# Patient Record
Sex: Female | Born: 1940
Health system: Southern US, Community
[De-identification: ages and names within clinical notes are randomized; demographics above are authoritative.]

## PROBLEM LIST (undated history)

## (undated) DIAGNOSIS — Z923 Personal history of irradiation: Secondary | ICD-10-CM

## (undated) DIAGNOSIS — I82409 Acute embolism and thrombosis of unspecified deep veins of unspecified lower extremity: Secondary | ICD-10-CM

## (undated) DIAGNOSIS — IMO0001 Reserved for inherently not codable concepts without codable children: Secondary | ICD-10-CM

## (undated) DIAGNOSIS — K219 Gastro-esophageal reflux disease without esophagitis: Secondary | ICD-10-CM

## (undated) DIAGNOSIS — IMO0002 Reserved for concepts with insufficient information to code with codable children: Secondary | ICD-10-CM

## (undated) DIAGNOSIS — K635 Polyp of colon: Secondary | ICD-10-CM

## (undated) DIAGNOSIS — E7800 Pure hypercholesterolemia, unspecified: Secondary | ICD-10-CM

## (undated) DIAGNOSIS — E611 Iron deficiency: Secondary | ICD-10-CM

## (undated) DIAGNOSIS — M2550 Pain in unspecified joint: Secondary | ICD-10-CM

## (undated) DIAGNOSIS — E785 Hyperlipidemia, unspecified: Secondary | ICD-10-CM

## (undated) DIAGNOSIS — C44712 Basal cell carcinoma of skin of right lower limb, including hip: Secondary | ICD-10-CM

## (undated) DIAGNOSIS — C3490 Malignant neoplasm of unspecified part of unspecified bronchus or lung: Secondary | ICD-10-CM

## (undated) DIAGNOSIS — C49A0 Gastrointestinal stromal tumor, unspecified site: Secondary | ICD-10-CM

## (undated) DIAGNOSIS — M8580 Other specified disorders of bone density and structure, unspecified site: Secondary | ICD-10-CM

## (undated) DIAGNOSIS — I2510 Atherosclerotic heart disease of native coronary artery without angina pectoris: Secondary | ICD-10-CM

## (undated) DIAGNOSIS — E119 Type 2 diabetes mellitus without complications: Secondary | ICD-10-CM

## (undated) DIAGNOSIS — G4700 Insomnia, unspecified: Secondary | ICD-10-CM

## (undated) DIAGNOSIS — I10 Essential (primary) hypertension: Secondary | ICD-10-CM

## (undated) DIAGNOSIS — D649 Anemia, unspecified: Secondary | ICD-10-CM

## (undated) DIAGNOSIS — C801 Malignant (primary) neoplasm, unspecified: Secondary | ICD-10-CM

## (undated) HISTORY — DX: Iron deficiency: E61.1

## (undated) HISTORY — PX: CHOLECYSTECTOMY: SHX55

## (undated) HISTORY — DX: Basal cell carcinoma of skin of right lower limb, including hip: C44.712

## (undated) HISTORY — PX: CARDIAC CATHETERIZATION: SHX172

## (undated) HISTORY — DX: Acute embolism and thrombosis of unspecified deep veins of unspecified lower extremity: I82.409

## (undated) HISTORY — DX: Malignant (primary) neoplasm, unspecified: C80.1

## (undated) HISTORY — DX: Insomnia, unspecified: G47.00

## (undated) HISTORY — PX: BREAST ENHANCEMENT SURGERY: SHX7

## (undated) HISTORY — PX: AUGMENTATION MAMMAPLASTY: SUR837

## (undated) HISTORY — DX: Atherosclerotic heart disease of native coronary artery without angina pectoris: I25.10

## (undated) HISTORY — DX: Pure hypercholesterolemia, unspecified: E78.00

## (undated) HISTORY — DX: Essential (primary) hypertension: I10

## (undated) HISTORY — DX: Pain in unspecified joint: M25.50

## (undated) HISTORY — DX: Anemia, unspecified: D64.9

## (undated) HISTORY — DX: Polyp of colon: K63.5

## (undated) HISTORY — DX: Reserved for inherently not codable concepts without codable children: IMO0001

## (undated) HISTORY — PX: TUBAL LIGATION: SHX77

## (undated) HISTORY — DX: Reserved for concepts with insufficient information to code with codable children: IMO0002

## (undated) HISTORY — DX: Other specified disorders of bone density and structure, unspecified site: M85.80

## (undated) HISTORY — PX: ABDOMINAL HYSTERECTOMY: SHX81

## (undated) HISTORY — DX: Gastro-esophageal reflux disease without esophagitis: K21.9

## (undated) HISTORY — DX: Hyperlipidemia, unspecified: E78.5

## (undated) HISTORY — PX: BREAST SURGERY: SHX581

## (undated) HISTORY — DX: Gastrointestinal stromal tumor, unspecified site: C49.A0

---

## 1991-11-17 DIAGNOSIS — C4491 Basal cell carcinoma of skin, unspecified: Secondary | ICD-10-CM

## 1991-11-17 HISTORY — DX: Basal cell carcinoma of skin, unspecified: C44.91

## 1992-05-28 HISTORY — PX: MOHS SURGERY: SHX181

## 1995-06-25 DIAGNOSIS — C4491 Basal cell carcinoma of skin, unspecified: Secondary | ICD-10-CM

## 1995-06-25 HISTORY — DX: Basal cell carcinoma of skin, unspecified: C44.91

## 1998-12-14 ENCOUNTER — Other Ambulatory Visit: Admission: RE | Admit: 1998-12-14 | Discharge: 1998-12-14 | Payer: Self-pay | Admitting: Obstetrics and Gynecology

## 1998-12-14 ENCOUNTER — Encounter (INDEPENDENT_AMBULATORY_CARE_PROVIDER_SITE_OTHER): Payer: Self-pay

## 1999-05-29 DIAGNOSIS — K279 Peptic ulcer, site unspecified, unspecified as acute or chronic, without hemorrhage or perforation: Secondary | ICD-10-CM

## 1999-05-29 DIAGNOSIS — B9681 Helicobacter pylori [H. pylori] as the cause of diseases classified elsewhere: Secondary | ICD-10-CM

## 1999-05-29 HISTORY — DX: Helicobacter pylori (H. pylori) as the cause of diseases classified elsewhere: B96.81

## 1999-05-29 HISTORY — DX: Helicobacter pylori (H. pylori) as the cause of diseases classified elsewhere: K27.9

## 1999-09-18 ENCOUNTER — Ambulatory Visit (HOSPITAL_COMMUNITY): Admission: RE | Admit: 1999-09-18 | Discharge: 1999-09-18 | Payer: Self-pay | Admitting: Gastroenterology

## 1999-10-04 ENCOUNTER — Ambulatory Visit (HOSPITAL_COMMUNITY): Admission: RE | Admit: 1999-10-04 | Discharge: 1999-10-04 | Payer: Self-pay | Admitting: Gastroenterology

## 2005-05-15 DIAGNOSIS — D229 Melanocytic nevi, unspecified: Secondary | ICD-10-CM

## 2005-05-15 DIAGNOSIS — C4492 Squamous cell carcinoma of skin, unspecified: Secondary | ICD-10-CM

## 2005-05-15 HISTORY — DX: Squamous cell carcinoma of skin, unspecified: C44.92

## 2005-05-15 HISTORY — DX: Melanocytic nevi, unspecified: D22.9

## 2006-05-28 DIAGNOSIS — I2510 Atherosclerotic heart disease of native coronary artery without angina pectoris: Secondary | ICD-10-CM

## 2006-05-28 HISTORY — DX: Atherosclerotic heart disease of native coronary artery without angina pectoris: I25.10

## 2007-04-30 ENCOUNTER — Inpatient Hospital Stay (HOSPITAL_COMMUNITY): Admission: EM | Admit: 2007-04-30 | Discharge: 2007-05-02 | Payer: Self-pay | Admitting: Emergency Medicine

## 2007-05-29 DIAGNOSIS — K922 Gastrointestinal hemorrhage, unspecified: Secondary | ICD-10-CM

## 2007-05-29 HISTORY — DX: Gastrointestinal hemorrhage, unspecified: K92.2

## 2007-07-14 ENCOUNTER — Ambulatory Visit: Payer: Self-pay | Admitting: Vascular Surgery

## 2010-03-17 ENCOUNTER — Emergency Department (HOSPITAL_COMMUNITY): Admission: EM | Admit: 2010-03-17 | Discharge: 2010-03-17 | Payer: Self-pay | Admitting: Emergency Medicine

## 2010-04-07 ENCOUNTER — Encounter: Admission: RE | Admit: 2010-04-07 | Discharge: 2010-04-07 | Payer: Self-pay | Admitting: Gastroenterology

## 2010-04-26 ENCOUNTER — Ambulatory Visit (HOSPITAL_COMMUNITY)
Admission: RE | Admit: 2010-04-26 | Discharge: 2010-04-26 | Payer: Self-pay | Source: Home / Self Care | Admitting: Gastroenterology

## 2010-05-28 HISTORY — PX: WHIPPLE PROCEDURE: SHX2667

## 2010-06-23 LAB — DIFFERENTIAL
Basophils Absolute: 0.1 10*3/uL (ref 0.0–0.1)
Basophils Relative: 1 % (ref 0–1)
Eosinophils Absolute: 0.1 10*3/uL (ref 0.0–0.7)
Eosinophils Relative: 2 % (ref 0–5)
Lymphocytes Relative: 28 % (ref 12–46)
Lymphs Abs: 1.5 10*3/uL (ref 0.7–4.0)
Monocytes Absolute: 0.4 10*3/uL (ref 0.1–1.0)
Monocytes Relative: 7 % (ref 3–12)
Neutro Abs: 3.4 10*3/uL (ref 1.7–7.7)
Neutrophils Relative %: 62 % (ref 43–77)

## 2010-06-23 LAB — COMPREHENSIVE METABOLIC PANEL
ALT: 16 U/L (ref 0–35)
AST: 28 U/L (ref 0–37)
Albumin: 3.8 g/dL (ref 3.5–5.2)
Alkaline Phosphatase: 65 U/L (ref 39–117)
BUN: 16 mg/dL (ref 6–23)
CO2: 27 mEq/L (ref 19–32)
Calcium: 9.6 mg/dL (ref 8.4–10.5)
Chloride: 106 mEq/L (ref 96–112)
Creatinine, Ser: 1.06 mg/dL (ref 0.4–1.2)
GFR calc Af Amer: >60 mL/min (ref >60)
GFR calc non Af Amer: 52 mL/min — ABNORMAL LOW (ref >60)
Glucose, Bld: 133 mg/dL — ABNORMAL HIGH (ref 70–99)
Potassium: 4.4 mEq/L (ref 3.5–5.1)
Sodium: 144 mEq/L (ref 135–145)
Total Bilirubin: 0.6 mg/dL (ref 0.3–1.2)
Total Protein: 7.2 g/dL (ref 6.0–8.3)

## 2010-06-23 LAB — URINE MICROSCOPIC-ADD ON

## 2010-06-23 LAB — PROTIME-INR
INR: 1 (ref 0.00–1.49)
Prothrombin Time: 13.4 seconds (ref 11.6–15.2)

## 2010-06-23 LAB — CBC
HCT: 37 % (ref 36.0–46.0)
Hemoglobin: 11.5 g/dL — ABNORMAL LOW (ref 12.0–15.0)
MCH: 27.1 pg (ref 26.0–34.0)
MCHC: 31.1 g/dL (ref 30.0–36.0)
MCV: 87.1 fL (ref 78.0–100.0)
Platelets: 275 10*3/uL (ref 150–400)
RBC: 4.25 MIL/uL (ref 3.87–5.11)
WBC: 5.5 10*3/uL (ref 4.0–10.5)

## 2010-06-23 LAB — ABO/RH: ABO/RH(D): O POS

## 2010-06-23 LAB — URINALYSIS, ROUTINE W REFLEX MICROSCOPIC
Bilirubin Urine: NEGATIVE
Hgb urine dipstick: NEGATIVE
Ketones, ur: NEGATIVE mg/dL
Nitrite: NEGATIVE
Protein, ur: NEGATIVE mg/dL
Specific Gravity, Urine: 1.024 (ref 1.005–1.030)
Urine Glucose, Fasting: NEGATIVE mg/dL
Urobilinogen, UA: 0.2 mg/dL (ref 0.0–1.0)
pH: 5 (ref 5.0–8.0)

## 2010-06-23 LAB — APTT: aPTT: 34 seconds (ref 24–37)

## 2010-06-23 LAB — SURGICAL PCR SCREEN
MRSA, PCR: INVALID — AB
Staphylococcus aureus: INVALID — AB

## 2010-06-26 LAB — MRSA CULTURE

## 2010-06-27 ENCOUNTER — Inpatient Hospital Stay (HOSPITAL_COMMUNITY)
Admission: RE | Admit: 2010-06-27 | Discharge: 2010-07-12 | DRG: 154 | Disposition: A | Discharge status: Home Health | Payer: BC Managed Care – PPO | Attending: General Surgery | Admitting: General Surgery

## 2010-06-27 ENCOUNTER — Other Ambulatory Visit: Payer: Self-pay | Admitting: General Surgery

## 2010-06-27 DIAGNOSIS — D126 Benign neoplasm of colon, unspecified: Secondary | ICD-10-CM | POA: Diagnosis present

## 2010-06-27 DIAGNOSIS — C179 Malignant neoplasm of small intestine, unspecified: Principal | ICD-10-CM | POA: Diagnosis present

## 2010-06-27 DIAGNOSIS — K219 Gastro-esophageal reflux disease without esophagitis: Secondary | ICD-10-CM | POA: Diagnosis present

## 2010-06-27 DIAGNOSIS — I2510 Atherosclerotic heart disease of native coronary artery without angina pectoris: Secondary | ICD-10-CM | POA: Diagnosis present

## 2010-06-27 DIAGNOSIS — Z95828 Presence of other vascular implants and grafts: Secondary | ICD-10-CM

## 2010-06-27 DIAGNOSIS — Y838 Other surgical procedures as the cause of abnormal reaction of the patient, or of later complication, without mention of misadventure at the time of the procedure: Secondary | ICD-10-CM | POA: Diagnosis not present

## 2010-06-27 DIAGNOSIS — I739 Peripheral vascular disease, unspecified: Secondary | ICD-10-CM | POA: Diagnosis present

## 2010-06-27 DIAGNOSIS — R5082 Postprocedural fever: Secondary | ICD-10-CM | POA: Diagnosis not present

## 2010-06-27 DIAGNOSIS — IMO0002 Reserved for concepts with insufficient information to code with codable children: Secondary | ICD-10-CM | POA: Diagnosis not present

## 2010-06-27 DIAGNOSIS — D649 Anemia, unspecified: Secondary | ICD-10-CM | POA: Diagnosis present

## 2010-06-27 DIAGNOSIS — E785 Hyperlipidemia, unspecified: Secondary | ICD-10-CM | POA: Diagnosis present

## 2010-06-27 DIAGNOSIS — E876 Hypokalemia: Secondary | ICD-10-CM | POA: Diagnosis not present

## 2010-06-27 DIAGNOSIS — I10 Essential (primary) hypertension: Secondary | ICD-10-CM | POA: Diagnosis present

## 2010-06-27 DIAGNOSIS — E46 Unspecified protein-calorie malnutrition: Secondary | ICD-10-CM | POA: Diagnosis present

## 2010-06-27 DIAGNOSIS — K259 Gastric ulcer, unspecified as acute or chronic, without hemorrhage or perforation: Secondary | ICD-10-CM | POA: Diagnosis present

## 2010-06-27 DIAGNOSIS — R7309 Other abnormal glucose: Secondary | ICD-10-CM | POA: Diagnosis present

## 2010-06-27 HISTORY — PX: PANCREATICODUODENECTOMY: SUR1000

## 2010-06-27 LAB — BASIC METABOLIC PANEL
BUN: 7 mg/dL (ref 6–23)
CO2: 24 mEq/L (ref 19–32)
Calcium: 8 mg/dL — ABNORMAL LOW (ref 8.4–10.5)
Chloride: 106 mEq/L (ref 96–112)
Creatinine, Ser: 0.67 mg/dL (ref 0.4–1.2)
GFR calc Af Amer: >60 mL/min (ref >60)
GFR calc non Af Amer: >60 mL/min (ref >60)
Glucose, Bld: 185 mg/dL — ABNORMAL HIGH (ref 70–99)
Potassium: 4.6 mEq/L (ref 3.5–5.1)
Sodium: 137 mEq/L (ref 135–145)

## 2010-06-27 LAB — BLOOD GAS, ARTERIAL
Acid-base deficit: 4.4 mmol/L — ABNORMAL HIGH (ref 0.0–2.0)
Bicarbonate: 22.2 mEq/L (ref 20.0–24.0)
Drawn by: 331471
O2 Content: 10 L/min
O2 Saturation: 99.9 %
Patient temperature: 98.6
TCO2: 21.2 mmol/L (ref 0–100)
pCO2 arterial: 51 mmHg — ABNORMAL HIGH (ref 35.0–45.0)
pH, Arterial: 7.262 — ABNORMAL LOW (ref 7.350–7.400)
pO2, Arterial: 206 mmHg — ABNORMAL HIGH (ref 80.0–100.0)

## 2010-06-27 LAB — CBC
HCT: 31.3 % — ABNORMAL LOW (ref 36.0–46.0)
Hemoglobin: 10 g/dL — ABNORMAL LOW (ref 12.0–15.0)
MCH: 27.8 pg (ref 26.0–34.0)
MCHC: 31.9 g/dL (ref 30.0–36.0)
MCV: 86.9 fL (ref 78.0–100.0)
Platelets: 279 10*3/uL (ref 150–400)
RBC: 3.6 MIL/uL — ABNORMAL LOW (ref 3.87–5.11)
RDW: 25.1 % — ABNORMAL HIGH (ref 11.5–15.5)
WBC: 12.3 10*3/uL — ABNORMAL HIGH (ref 4.0–10.5)

## 2010-06-27 LAB — POCT I-STAT 7, (LYTES, BLD GAS, ICA,H+H)
Acid-base deficit: 3 mmol/L — ABNORMAL HIGH (ref 0.0–2.0)
Bicarbonate: 21.8 mEq/L (ref 20.0–24.0)
Calcium, Ion: 1.11 mmol/L — ABNORMAL LOW (ref 1.12–1.32)
HCT: 27 % — ABNORMAL LOW (ref 36.0–46.0)
Hemoglobin: 9.2 g/dL — ABNORMAL LOW (ref 12.0–15.0)
O2 Saturation: 100 %
Patient temperature: 35.9
Potassium: 3.7 mEq/L (ref 3.5–5.1)
Sodium: 139 mEq/L (ref 135–145)
TCO2: 23 mmol/L (ref 0–100)
pCO2 arterial: 34.9 mmHg — ABNORMAL LOW (ref 35.0–45.0)
pH, Arterial: 7.4 (ref 7.350–7.400)
pO2, Arterial: 172 mmHg — ABNORMAL HIGH (ref 80.0–100.0)

## 2010-06-27 LAB — PHOSPHORUS: Phosphorus: 4.2 mg/dL (ref 2.3–4.6)

## 2010-06-27 LAB — MAGNESIUM: Magnesium: 1.4 mg/dL — ABNORMAL LOW (ref 1.5–2.5)

## 2010-06-28 LAB — PROTIME-INR
INR: 1.11 (ref 0.00–1.49)
Prothrombin Time: 14.5 seconds (ref 11.6–15.2)

## 2010-06-28 LAB — GLUCOSE, CAPILLARY
Glucose-Capillary: 174 mg/dL — ABNORMAL HIGH (ref 70–99)
Glucose-Capillary: 123 mg/dL — ABNORMAL HIGH (ref 70–99)
Glucose-Capillary: 125 mg/dL — ABNORMAL HIGH (ref 70–99)

## 2010-06-28 LAB — CROSSMATCH
ABO/RH(D): O POS
Antibody Screen: NEGATIVE
Unit division: 0
Unit division: 0

## 2010-06-28 LAB — CBC
HCT: 32.7 % — ABNORMAL LOW (ref 36.0–46.0)
Hemoglobin: 10.6 g/dL — ABNORMAL LOW (ref 12.0–15.0)
MCH: 28.3 pg (ref 26.0–34.0)
MCHC: 32.4 g/dL (ref 30.0–36.0)
MCV: 87.2 fL (ref 78.0–100.0)
Platelets: 275 10*3/uL (ref 150–400)
RBC: 3.75 MIL/uL — ABNORMAL LOW (ref 3.87–5.11)
RDW: 24.9 % — ABNORMAL HIGH (ref 11.5–15.5)
WBC: 11.1 10*3/uL — ABNORMAL HIGH (ref 4.0–10.5)

## 2010-06-28 LAB — COMPREHENSIVE METABOLIC PANEL
ALT: 80 U/L — ABNORMAL HIGH (ref 0–35)
AST: 96 U/L — ABNORMAL HIGH (ref 0–37)
Albumin: 2.7 g/dL — ABNORMAL LOW (ref 3.5–5.2)
Alkaline Phosphatase: 45 U/L (ref 39–117)
BUN: 9 mg/dL (ref 6–23)
CO2: 25 mEq/L (ref 19–32)
Calcium: 8.1 mg/dL — ABNORMAL LOW (ref 8.4–10.5)
Chloride: 105 mEq/L (ref 96–112)
Creatinine, Ser: 0.67 mg/dL (ref 0.4–1.2)
GFR calc Af Amer: >60 mL/min (ref >60)
GFR calc non Af Amer: >60 mL/min (ref >60)
Glucose, Bld: 180 mg/dL — ABNORMAL HIGH (ref 70–99)
Potassium: 4.5 mEq/L (ref 3.5–5.1)
Sodium: 136 mEq/L (ref 135–145)
Total Bilirubin: 0.6 mg/dL (ref 0.3–1.2)
Total Protein: 5.1 g/dL — ABNORMAL LOW (ref 6.0–8.3)

## 2010-06-28 LAB — PHOSPHORUS: Phosphorus: 3.9 mg/dL (ref 2.3–4.6)

## 2010-06-28 LAB — MAGNESIUM: Magnesium: 2 mg/dL (ref 1.5–2.5)

## 2010-06-28 LAB — APTT: aPTT: 38 seconds — ABNORMAL HIGH (ref 24–37)

## 2010-06-29 LAB — GLUCOSE, CAPILLARY
Glucose-Capillary: 104 mg/dL — ABNORMAL HIGH (ref 70–99)
Glucose-Capillary: 108 mg/dL — ABNORMAL HIGH (ref 70–99)
Glucose-Capillary: 119 mg/dL — ABNORMAL HIGH (ref 70–99)
Glucose-Capillary: 124 mg/dL — ABNORMAL HIGH (ref 70–99)
Glucose-Capillary: 130 mg/dL — ABNORMAL HIGH (ref 70–99)
Glucose-Capillary: 131 mg/dL — ABNORMAL HIGH (ref 70–99)
Glucose-Capillary: 134 mg/dL — ABNORMAL HIGH (ref 70–99)

## 2010-06-29 LAB — COMPREHENSIVE METABOLIC PANEL
ALT: 71 U/L — ABNORMAL HIGH (ref 0–35)
AST: 75 U/L — ABNORMAL HIGH (ref 0–37)
Albumin: 2.2 g/dL — ABNORMAL LOW (ref 3.5–5.2)
Alkaline Phosphatase: 47 U/L (ref 39–117)
BUN: 8 mg/dL (ref 6–23)
CO2: 25 mEq/L (ref 19–32)
Calcium: 7.8 mg/dL — ABNORMAL LOW (ref 8.4–10.5)
Chloride: 100 mEq/L (ref 96–112)
Creatinine, Ser: 0.64 mg/dL (ref 0.4–1.2)
GFR calc Af Amer: >60 mL/min (ref >60)
GFR calc non Af Amer: >60 mL/min (ref >60)
Glucose, Bld: 131 mg/dL — ABNORMAL HIGH (ref 70–99)
Potassium: 4.7 mEq/L (ref 3.5–5.1)
Sodium: 133 mEq/L — ABNORMAL LOW (ref 135–145)
Total Bilirubin: 0.8 mg/dL (ref 0.3–1.2)
Total Protein: 4.7 g/dL — ABNORMAL LOW (ref 6.0–8.3)

## 2010-06-29 LAB — CBC
HCT: 33.1 % — ABNORMAL LOW (ref 36.0–46.0)
Hemoglobin: 10.4 g/dL — ABNORMAL LOW (ref 12.0–15.0)
MCH: 27.3 pg (ref 26.0–34.0)
MCHC: 31.4 g/dL (ref 30.0–36.0)
MCV: 86.9 fL (ref 78.0–100.0)
Platelets: 256 10*3/uL (ref 150–400)
RBC: 3.81 MIL/uL — ABNORMAL LOW (ref 3.87–5.11)
RDW: 25 % — ABNORMAL HIGH (ref 11.5–15.5)
WBC: 14.1 10*3/uL — ABNORMAL HIGH (ref 4.0–10.5)

## 2010-06-29 LAB — HEMOGLOBIN A1C
Hgb A1c MFr Bld: 4.8 % (ref <5.7)
Mean Plasma Glucose: 91 mg/dL (ref <117)

## 2010-06-29 LAB — PHOSPHORUS: Phosphorus: 2.5 mg/dL (ref 2.3–4.6)

## 2010-06-29 LAB — MAGNESIUM: Magnesium: 1.9 mg/dL (ref 1.5–2.5)

## 2010-06-30 LAB — COMPREHENSIVE METABOLIC PANEL
ALT: 42 U/L — ABNORMAL HIGH (ref 0–35)
AST: 37 U/L (ref 0–37)
Albumin: 2.1 g/dL — ABNORMAL LOW (ref 3.5–5.2)
Alkaline Phosphatase: 51 U/L (ref 39–117)
BUN: 7 mg/dL (ref 6–23)
CO2: 26 mEq/L (ref 19–32)
Calcium: 7.7 mg/dL — ABNORMAL LOW (ref 8.4–10.5)
Chloride: 101 mEq/L (ref 96–112)
Creatinine, Ser: 0.6 mg/dL (ref 0.4–1.2)
GFR calc Af Amer: >60 mL/min (ref >60)
GFR calc non Af Amer: >60 mL/min (ref >60)
Glucose, Bld: 151 mg/dL — ABNORMAL HIGH (ref 70–99)
Potassium: 4.3 mEq/L (ref 3.5–5.1)
Sodium: 132 mEq/L — ABNORMAL LOW (ref 135–145)
Total Bilirubin: 0.7 mg/dL (ref 0.3–1.2)
Total Protein: 4.8 g/dL — ABNORMAL LOW (ref 6.0–8.3)

## 2010-06-30 LAB — CBC
HCT: 28.4 % — ABNORMAL LOW (ref 36.0–46.0)
Hemoglobin: 9.1 g/dL — ABNORMAL LOW (ref 12.0–15.0)
MCH: 27.8 pg (ref 26.0–34.0)
MCHC: 32 g/dL (ref 30.0–36.0)
MCV: 86.9 fL (ref 78.0–100.0)
Platelets: 200 10*3/uL (ref 150–400)
RBC: 3.27 MIL/uL — ABNORMAL LOW (ref 3.87–5.11)
RDW: 24.9 % — ABNORMAL HIGH (ref 11.5–15.5)
WBC: 11.8 10*3/uL — ABNORMAL HIGH (ref 4.0–10.5)

## 2010-06-30 LAB — GLUCOSE, CAPILLARY
Glucose-Capillary: 131 mg/dL — ABNORMAL HIGH (ref 70–99)
Glucose-Capillary: 126 mg/dL — ABNORMAL HIGH (ref 70–99)
Glucose-Capillary: 116 mg/dL — ABNORMAL HIGH (ref 70–99)
Glucose-Capillary: 137 mg/dL — ABNORMAL HIGH (ref 70–99)
Glucose-Capillary: 125 mg/dL — ABNORMAL HIGH (ref 70–99)

## 2010-07-01 LAB — GLUCOSE, CAPILLARY
Glucose-Capillary: 108 mg/dL — ABNORMAL HIGH (ref 70–99)
Glucose-Capillary: 113 mg/dL — ABNORMAL HIGH (ref 70–99)
Glucose-Capillary: 122 mg/dL — ABNORMAL HIGH (ref 70–99)
Glucose-Capillary: 126 mg/dL — ABNORMAL HIGH (ref 70–99)
Glucose-Capillary: 128 mg/dL — ABNORMAL HIGH (ref 70–99)

## 2010-07-01 LAB — COMPREHENSIVE METABOLIC PANEL
ALT: 30 U/L (ref 0–35)
AST: 28 U/L (ref 0–37)
Albumin: 2.1 g/dL — ABNORMAL LOW (ref 3.5–5.2)
Alkaline Phosphatase: 70 U/L (ref 39–117)
BUN: 6 mg/dL (ref 6–23)
CO2: 26 mEq/L (ref 19–32)
Calcium: 7.9 mg/dL — ABNORMAL LOW (ref 8.4–10.5)
Chloride: 101 mEq/L (ref 96–112)
Creatinine, Ser: 0.61 mg/dL (ref 0.4–1.2)
GFR calc Af Amer: >60 mL/min (ref >60)
GFR calc non Af Amer: >60 mL/min (ref >60)
Glucose, Bld: 139 mg/dL — ABNORMAL HIGH (ref 70–99)
Potassium: 3.8 mEq/L (ref 3.5–5.1)
Sodium: 134 mEq/L — ABNORMAL LOW (ref 135–145)
Total Bilirubin: 0.5 mg/dL (ref 0.3–1.2)
Total Protein: 5 g/dL — ABNORMAL LOW (ref 6.0–8.3)

## 2010-07-01 LAB — MAGNESIUM: Magnesium: 2.1 mg/dL (ref 1.5–2.5)

## 2010-07-01 LAB — PHOSPHORUS: Phosphorus: 1.6 mg/dL — ABNORMAL LOW (ref 2.3–4.6)

## 2010-07-01 LAB — AMYLASE, BODY FLUID: Amylase, Fluid: 773 U/L

## 2010-07-01 LAB — CBC
HCT: 27.4 % — ABNORMAL LOW (ref 36.0–46.0)
Hemoglobin: 8.7 g/dL — ABNORMAL LOW (ref 12.0–15.0)
MCH: 27.4 pg (ref 26.0–34.0)
MCHC: 31.8 g/dL (ref 30.0–36.0)
MCV: 86.2 fL (ref 78.0–100.0)
Platelets: 255 10*3/uL (ref 150–400)
RBC: 3.18 MIL/uL — ABNORMAL LOW (ref 3.87–5.11)
RDW: 25.3 % — ABNORMAL HIGH (ref 11.5–15.5)
WBC: 14.2 10*3/uL — ABNORMAL HIGH (ref 4.0–10.5)

## 2010-07-01 LAB — AMYLASE: Amylase: 106 U/L — ABNORMAL HIGH (ref 0–105)

## 2010-07-02 LAB — GLUCOSE, CAPILLARY
Glucose-Capillary: 104 mg/dL — ABNORMAL HIGH (ref 70–99)
Glucose-Capillary: 108 mg/dL — ABNORMAL HIGH (ref 70–99)
Glucose-Capillary: 115 mg/dL — ABNORMAL HIGH (ref 70–99)
Glucose-Capillary: 117 mg/dL — ABNORMAL HIGH (ref 70–99)
Glucose-Capillary: 119 mg/dL — ABNORMAL HIGH (ref 70–99)
Glucose-Capillary: 125 mg/dL — ABNORMAL HIGH (ref 70–99)
Glucose-Capillary: 127 mg/dL — ABNORMAL HIGH (ref 70–99)
Glucose-Capillary: 129 mg/dL — ABNORMAL HIGH (ref 70–99)

## 2010-07-02 LAB — CBC
HCT: 25.3 % — ABNORMAL LOW (ref 36.0–46.0)
Hemoglobin: 8.2 g/dL — ABNORMAL LOW (ref 12.0–15.0)
MCH: 27.7 pg (ref 26.0–34.0)
MCHC: 32.4 g/dL (ref 30.0–36.0)
MCV: 85.5 fL (ref 78.0–100.0)
Platelets: 268 10*3/uL (ref 150–400)
RBC: 2.96 MIL/uL — ABNORMAL LOW (ref 3.87–5.11)
RDW: 25.6 % — ABNORMAL HIGH (ref 11.5–15.5)
WBC: 13 10*3/uL — ABNORMAL HIGH (ref 4.0–10.5)

## 2010-07-02 LAB — CREATININE, SERUM
Creatinine, Ser: 0.65 mg/dL (ref 0.4–1.2)
GFR calc Af Amer: >60 mL/min (ref >60)
GFR calc non Af Amer: >60 mL/min (ref >60)

## 2010-07-02 LAB — POTASSIUM: Potassium: 3.1 mEq/L — ABNORMAL LOW (ref 3.5–5.1)

## 2010-07-02 LAB — PHOSPHORUS: Phosphorus: 2.7 mg/dL (ref 2.3–4.6)

## 2010-07-02 LAB — MAGNESIUM: Magnesium: 2.3 mg/dL (ref 1.5–2.5)

## 2010-07-03 LAB — LIPASE, BLOOD: Lipase: 18 U/L (ref 11–59)

## 2010-07-03 LAB — GLUCOSE, CAPILLARY
Glucose-Capillary: 126 mg/dL — ABNORMAL HIGH (ref 70–99)
Glucose-Capillary: 114 mg/dL — ABNORMAL HIGH (ref 70–99)
Glucose-Capillary: 113 mg/dL — ABNORMAL HIGH (ref 70–99)
Glucose-Capillary: 118 mg/dL — ABNORMAL HIGH (ref 70–99)
Glucose-Capillary: 119 mg/dL — ABNORMAL HIGH (ref 70–99)

## 2010-07-03 LAB — COMPREHENSIVE METABOLIC PANEL
ALT: 19 U/L (ref 0–35)
AST: 19 U/L (ref 0–37)
Albumin: 1.8 g/dL — ABNORMAL LOW (ref 3.5–5.2)
Alkaline Phosphatase: 76 U/L (ref 39–117)
BUN: 8 mg/dL (ref 6–23)
CO2: 26 mEq/L (ref 19–32)
Calcium: 8 mg/dL — ABNORMAL LOW (ref 8.4–10.5)
Chloride: 99 mEq/L (ref 96–112)
Creatinine, Ser: 0.79 mg/dL (ref 0.4–1.2)
GFR calc Af Amer: >60 mL/min (ref >60)
GFR calc non Af Amer: >60 mL/min (ref >60)
Glucose, Bld: 120 mg/dL — ABNORMAL HIGH (ref 70–99)
Potassium: 3.2 mEq/L — ABNORMAL LOW (ref 3.5–5.1)
Sodium: 135 mEq/L (ref 135–145)
Total Bilirubin: 0.9 mg/dL (ref 0.3–1.2)
Total Protein: 5.1 g/dL — ABNORMAL LOW (ref 6.0–8.3)

## 2010-07-03 LAB — CBC
HCT: 26.3 % — ABNORMAL LOW (ref 36.0–46.0)
Hemoglobin: 8.5 g/dL — ABNORMAL LOW (ref 12.0–15.0)
MCH: 27.5 pg (ref 26.0–34.0)
MCHC: 32.3 g/dL (ref 30.0–36.0)
MCV: 85.1 fL (ref 78.0–100.0)
Platelets: 291 10*3/uL (ref 150–400)
RBC: 3.09 MIL/uL — ABNORMAL LOW (ref 3.87–5.11)
RDW: 25.6 % — ABNORMAL HIGH (ref 11.5–15.5)
WBC: 16.7 10*3/uL — ABNORMAL HIGH (ref 4.0–10.5)

## 2010-07-03 LAB — PREALBUMIN: Prealbumin: 6.3 mg/dL — ABNORMAL LOW (ref 17.0–34.0)

## 2010-07-04 LAB — CBC
HCT: 26.2 % — ABNORMAL LOW (ref 36.0–46.0)
Hemoglobin: 8.5 g/dL — ABNORMAL LOW (ref 12.0–15.0)
MCH: 27.3 pg (ref 26.0–34.0)
MCHC: 32.4 g/dL (ref 30.0–36.0)
MCV: 84.2 fL (ref 78.0–100.0)
Platelets: 324 10*3/uL (ref 150–400)
RBC: 3.11 MIL/uL — ABNORMAL LOW (ref 3.87–5.11)
RDW: 25.7 % — ABNORMAL HIGH (ref 11.5–15.5)
WBC: 20.5 10*3/uL — ABNORMAL HIGH (ref 4.0–10.5)

## 2010-07-04 LAB — BASIC METABOLIC PANEL
BUN: 6 mg/dL (ref 6–23)
CO2: 24 mEq/L (ref 19–32)
Calcium: 7.9 mg/dL — ABNORMAL LOW (ref 8.4–10.5)
Chloride: 100 mEq/L (ref 96–112)
Creatinine, Ser: 0.68 mg/dL (ref 0.4–1.2)
GFR calc Af Amer: >60 mL/min (ref >60)
GFR calc non Af Amer: >60 mL/min (ref >60)
Glucose, Bld: 111 mg/dL — ABNORMAL HIGH (ref 70–99)
Potassium: 3.6 mEq/L (ref 3.5–5.1)
Sodium: 135 mEq/L (ref 135–145)

## 2010-07-04 LAB — GLUCOSE, CAPILLARY
Glucose-Capillary: 112 mg/dL — ABNORMAL HIGH (ref 70–99)
Glucose-Capillary: 114 mg/dL — ABNORMAL HIGH (ref 70–99)
Glucose-Capillary: 122 mg/dL — ABNORMAL HIGH (ref 70–99)
Glucose-Capillary: 142 mg/dL — ABNORMAL HIGH (ref 70–99)
Glucose-Capillary: 149 mg/dL — ABNORMAL HIGH (ref 70–99)
Glucose-Capillary: 150 mg/dL — ABNORMAL HIGH (ref 70–99)

## 2010-07-05 ENCOUNTER — Inpatient Hospital Stay (HOSPITAL_COMMUNITY): Payer: BC Managed Care – PPO

## 2010-07-05 LAB — COMPREHENSIVE METABOLIC PANEL
ALT: 12 U/L (ref 0–35)
AST: 17 U/L (ref 0–37)
Albumin: 1.6 g/dL — ABNORMAL LOW (ref 3.5–5.2)
Alkaline Phosphatase: 74 U/L (ref 39–117)
BUN: 6 mg/dL (ref 6–23)
CO2: 28 mEq/L (ref 19–32)
Calcium: 7.9 mg/dL — ABNORMAL LOW (ref 8.4–10.5)
Chloride: 98 mEq/L (ref 96–112)
Creatinine, Ser: 0.69 mg/dL (ref 0.4–1.2)
GFR calc Af Amer: >60 mL/min (ref >60)
GFR calc non Af Amer: >60 mL/min (ref >60)
Glucose, Bld: 219 mg/dL — ABNORMAL HIGH (ref 70–99)
Potassium: 3.4 mEq/L — ABNORMAL LOW (ref 3.5–5.1)
Sodium: 134 mEq/L — ABNORMAL LOW (ref 135–145)
Total Bilirubin: 0.3 mg/dL (ref 0.3–1.2)
Total Protein: 4.9 g/dL — ABNORMAL LOW (ref 6.0–8.3)

## 2010-07-05 LAB — DIFFERENTIAL
Basophils Absolute: 0 10*3/uL (ref 0.0–0.1)
Basophils Relative: 0 % (ref 0–1)
Eosinophils Absolute: 0.2 10*3/uL (ref 0.0–0.7)
Eosinophils Relative: 1 % (ref 0–5)
Lymphocytes Relative: 5 % — ABNORMAL LOW (ref 12–46)
Lymphs Abs: 1 10*3/uL (ref 0.7–4.0)
Monocytes Absolute: 1.3 10*3/uL — ABNORMAL HIGH (ref 0.1–1.0)
Monocytes Relative: 7 % (ref 3–12)
Neutro Abs: 16.7 10*3/uL — ABNORMAL HIGH (ref 1.7–7.7)
Neutrophils Relative %: 87 % — ABNORMAL HIGH (ref 43–77)

## 2010-07-05 LAB — CBC
HCT: 25.1 % — ABNORMAL LOW (ref 36.0–46.0)
Hemoglobin: 8.1 g/dL — ABNORMAL LOW (ref 12.0–15.0)
MCH: 27.3 pg (ref 26.0–34.0)
MCHC: 32.3 g/dL (ref 30.0–36.0)
MCV: 84.5 fL (ref 78.0–100.0)
Platelets: 342 10*3/uL (ref 150–400)
RBC: 2.97 MIL/uL — ABNORMAL LOW (ref 3.87–5.11)
RDW: 25.5 % — ABNORMAL HIGH (ref 11.5–15.5)
WBC: 19.2 10*3/uL — ABNORMAL HIGH (ref 4.0–10.5)

## 2010-07-05 LAB — PREALBUMIN: Prealbumin: 5 mg/dL — ABNORMAL LOW (ref 17.0–34.0)

## 2010-07-05 LAB — GLUCOSE, CAPILLARY
Glucose-Capillary: 131 mg/dL — ABNORMAL HIGH (ref 70–99)
Glucose-Capillary: 155 mg/dL — ABNORMAL HIGH (ref 70–99)
Glucose-Capillary: 164 mg/dL — ABNORMAL HIGH (ref 70–99)
Glucose-Capillary: 234 mg/dL — ABNORMAL HIGH (ref 70–99)
Glucose-Capillary: 245 mg/dL — ABNORMAL HIGH (ref 70–99)
Glucose-Capillary: 256 mg/dL — ABNORMAL HIGH (ref 70–99)

## 2010-07-05 LAB — TRIGLYCERIDES: Triglycerides: 109 mg/dL (ref <150)

## 2010-07-05 LAB — PHOSPHORUS: Phosphorus: 3.2 mg/dL (ref 2.3–4.6)

## 2010-07-05 LAB — MAGNESIUM: Magnesium: 2 mg/dL (ref 1.5–2.5)

## 2010-07-05 LAB — CHOLESTEROL, TOTAL: Cholesterol: 85 mg/dL (ref 0–200)

## 2010-07-05 NOTE — Op Note (Signed)
[name], [name]           ACCOUNT NO.:  [id number]  MEDICAL RECORD NO.:  [id number]          PATIENT TYPE:  INP  LOCATION:  0003                         FACILITY:  [facility]  PHYSICIAN:  Almond Lint, MD       DATE OF BIRTH:  1940-12-21  DATE OF PROCEDURE:  06/27/2010 DATE OF DISCHARGE:                              OPERATIVE REPORT   PREOPERATIVE DIAGNOSIS:  Duodenal mass x2.  POSTOPERATIVE DIAGNOSIS:  Duodenal mass and jejunal mass.  PROCEDURE:  Pancreaticoduodenectomy (Whipple), pancreatic stent placement and On-Q pain pump catheter placement.  SURGEON:  Almond Lint, MD  ASSISTANT:  Lorne Skeens. Hoxworth, M.D.  ANESTHESIA:  General and local.  FINDINGS:  Exophytic mass in the proximal jejunum, approximately 3 cm in diameter, and the duodenal mass proximal to the ampulla was not visible from a duodenotomy, but was palpable in the specimen.  SPECIMENS:  Pancreaticoduodenectomy, pylorus, gallbladder to pathology.  ESTIMATED BLOOD LOSS:  700 mL.  COMPLICATIONS:  None known.  PROCEDURE:  [name] was identified in the holding area an taken to the operating room where she was placed supine on the operating room table.  General anesthesia was induced.  A central catheter was placed. A Foley catheter was placed.  Her abdomen was prepped and draped in a sterile fashion.  A time-out was performed according to the surgical safety check list.  When all was correct, we continued.  A midline incision was made above the umbilicus.  This was done with a #10 blade. The subcutaneous tissues were divided with cautery and the fascia was opened in the midline with cautery.  The peritoneum was elevated with 2 tonsil clamps and opened sharply with Metzenbaum scissors.  The fascial incision was carried out the length of the skin incision with cautery.   First, the larger mass felt to be at the fourth portion of the duodenum.  It was evaluated and was noted to be in the proximal  jejunum.  The duodenum was then Kocherized and 2 stay sutures were placed on the duodenum with 2-0 silks.  The duodenotomy was made with cautery.  There was no visible sign of the mass; however, the ampulla was very nicely visualized.  It felt like the 1 cm mass visible on CT scan was just proximal to the ampulla that was difficult to ascertain.  There was a little bit of contour in area but not anything dramatic.  An decision was then made to proceed with Whipple.  The incision was then carried out laterally with a #10 blade and then the cautery was used to open the subcutaneous tissue and the muscle layers.  The upper skin flap was sutured down to the skin to facilitate visualization.  The Bookwalter retractor was used to assist with visualization.  The gallbladder was clamped with a Tresa Endo and taken down off the liver with the cautery. Once the infundibulum was approached, the cystic duct was skeletonized with the Overholt and the cautery.  Then, the cystic artery was divided with a harmonic scalpel and clipped with a small Hem-o-Lok.  The gallbladder was used as a handle to skeletonize the cystic duct.  The  cystic duct went down to the common bile duct and this was identified.  There was a significant amount of lymphovascular tissue overlying the porta.  This was taken down.  The gastrohepatic ligament was taken down.  The common hepatic artery was palpated at the superior surface of the pancreas and this was identified at the juncture where it turned to the proper hepatic and the gastroduodenal artery.  The gastroduodenal artery was isolated.  This was held to investigate the undersurface of the pancreas.  The omentum was divided with the harmonic scalpel and the middle colic vein was identified.  The middle colic vein was traced in the cranial direction and was traced to the inferior border of the pancreas.  A Kelly clamp passed easily underneath the pancreas that was visualized on  the superior surface.  The gastroduodenal artery was test clamped and there was still good flow to the liver.  This was then clamped, divided and suture ligated with a 2-0 Prolene and a locking hemoclip.  The other end was tied with 2-0 silk.    Attention was then directed to the stomach.  At first, attempts were made to preserve the pylorus by passing underneath the duodenum just distal to the pylorus and dividing it with a TA.  The pylorus was found to be extremely tight and the pylorus was then taken. Two fires of GIA 75 were used to divide the stomach farther up.  The common bile duct was skeletonized and clamped with a bulldog. At this point, attention was to the jejunum.  The jejunum was divided with a GIA 75 approximately 8 cm distal to the mass which was around 4-5 cm distal to the ligament of Treitz.  The distal end of the jejunum was oversewn with a 3-0 PDS.  Also of note, the stomach staple line was oversewn with a 3- 0 PDS as well.  The harmonic scalpel was used to divide the mesentery going to the duodenum and proximal jejunum.  There were several areas where locking clips had to be placed.  Once the duodenum had been freed up and all the adhesions on the left side of the abdomen were taken, this was passed underneath the SMA and SMV to the right upper quadrant.  There were no additional adhesions that had to be taken.    The common bile duct was then divided sharply with curved scissors.  There was a very brisk bleeder from the common bile duct and this was isolated and tapped gently with the Bovie.  The common bile duct adhesions to the portal vein were taken around 8 mm in order to free up adequately for an anastomosis.  The Tresa Endo was then passed underneath the pancreas and the pancreas was divided with a #10 blade.  2-0 silks were used for hemostatic sutures on the superior and inferior pancreaticoduodenal arteries.  The pancreatic duct was visible and of good  caliber.  It was approximately 5 mm in diameter.  The other side of the pancreas for the specimen had the 2 bleeding vessels coagulated.  The harmonic scalpel with locking Weck clips were then used to take the uncinate process from the retroperitoneum.  There were several vessels from the portal vein going directly into the pancreas and care was taken to clip these.  Once the specimen was completely out, the right upper quadrant was packed with several lap sponges and the specimen marked.  The right upper quadrant was then irrigated.  The small bowel in the  left upper quadrant distal to the ligament Treitz was then rotated underneath the superior mesenteric artery and vein. The specimen was not sent for frozen as the pancreatic duct and biliary margins were not even close to being involved with the tumor.  Attention was first directed to the biliary anastomosis.  At an appropriate site, approximately a 1 cm jejunotomy was made all the way through. Interrupted 4-0 PDS sutures were used to create the anastomosis.  The 2 corner sutures were placed first and then the posterior layer sutures. The anterior sutures were then placed in an interrupted fashion and several 3-0 Vicryl pops were used to take tension off the anastomosis by securing the jejunum to the fatty surface of the retroperitoneum and the liver.    Attention was then directed to the pancreatic anastomosis.  The lay of the jejunal loop with the parenchyma of the pancreas was identified and the muscular layers of the jejunum were divided in the same length.  A hole was made in the jejunum of approximately the same diameter as the pancreatic duct.   The posterior layer of 2-0 silk was placed along the myotomy.  An 18-French pediatric feeding tube was then used as the pancreatic duct stent and was placed in the pancreas and in the jejunum.  Six 5-0 PDS sutures were used to create the pancreatic anastomosis in an interrupted  fashion.  The anterior layer of 2-0 silk was then placed to hold the pancreatic parenchyma to the jejunum.   Attention was then directed distal to the ligament of Treitz, and omentum was taken down to not put pressure on the gastrojejunostomy. Around 20 or 30 cm distal to the former ligament of Treitz was identified and this was brought up to the posterior stomach.  Two 3-0 silk stay sutures were placed around 6 cm apart.  An opening was made at the base of the stomach and the jejunum with the cautery.  A 75 mm GIA was used to create a gastrojejunostomy.  The NG tube was then passed into the afferent lobe toward the biliary and pancreatic anastomoses. This was secured down at the nares.  The defect of the gastrojejunostomy was then closed using 3-0 PDS in a continental fashion.  The abdomen was then irrigated.  Tisseel was placed over the biliary and pancreatic anastomoses.  Two 19 Blake drains were placed in the right upper quadrant with the lateral-most drain going posterior to the biliary anastomosis and the medial drain going anterior to the biliary and pancreatic anastomoses.  The On-Q tunneler was used to place a catheter sheath in the abdominal wall.  The fascia was then closed using #1 loop PDS sutures.  The lateral portion of the incision was closed in two layers.  The skin was then irrigated and closed with staples.  The On-Q catheters were advanced through the tunneler sheath and bolused with local anesthetic.  The skin was then cleaned, dried and dressed with island dressing and the On-Q pain pump catheter was dressed with Steri- Strips and Tegaderm.  The patient tolerated the procedure well.  She was taken to PACU in stable condition.  Needle, sponge and instrument counts were correct x2.     Almond Lint, MD     FB/MEDQ  D:  06/27/2010  T:  06/27/2010  Job:  [id number]  cc:   Everardo All. Madilyn Fireman, M.D. Fax: [phone]  Theressa Millard, M.D. Fax: [phone]  Willis Modena,  MD Fax: [phone]  Electronically Signed by Almond Lint MD  on 07/05/2010 02:38:47 PM

## 2010-07-06 LAB — PHOSPHORUS: Phosphorus: 3.7 mg/dL (ref 2.3–4.6)

## 2010-07-06 LAB — CBC
HCT: 25.7 % — ABNORMAL LOW (ref 36.0–46.0)
Hemoglobin: 8.2 g/dL — ABNORMAL LOW (ref 12.0–15.0)
MCH: 27.1 pg (ref 26.0–34.0)
MCHC: 31.9 g/dL (ref 30.0–36.0)
MCV: 84.8 fL (ref 78.0–100.0)
Platelets: 318 10*3/uL (ref 150–400)
RBC: 3.03 MIL/uL — ABNORMAL LOW (ref 3.87–5.11)
RDW: 25.5 % — ABNORMAL HIGH (ref 11.5–15.5)
WBC: 15.5 10*3/uL — ABNORMAL HIGH (ref 4.0–10.5)

## 2010-07-06 LAB — GLUCOSE, CAPILLARY
Glucose-Capillary: 140 mg/dL — ABNORMAL HIGH (ref 70–99)
Glucose-Capillary: 149 mg/dL — ABNORMAL HIGH (ref 70–99)
Glucose-Capillary: 156 mg/dL — ABNORMAL HIGH (ref 70–99)
Glucose-Capillary: 159 mg/dL — ABNORMAL HIGH (ref 70–99)
Glucose-Capillary: 181 mg/dL — ABNORMAL HIGH (ref 70–99)
Glucose-Capillary: 194 mg/dL — ABNORMAL HIGH (ref 70–99)
Glucose-Capillary: 209 mg/dL — ABNORMAL HIGH (ref 70–99)

## 2010-07-06 LAB — COMPREHENSIVE METABOLIC PANEL
ALT: 12 U/L (ref 0–35)
AST: 19 U/L (ref 0–37)
Albumin: 1.6 g/dL — ABNORMAL LOW (ref 3.5–5.2)
Alkaline Phosphatase: 66 U/L (ref 39–117)
BUN: 6 mg/dL (ref 6–23)
CO2: 28 mEq/L (ref 19–32)
Calcium: 7.8 mg/dL — ABNORMAL LOW (ref 8.4–10.5)
Chloride: 101 mEq/L (ref 96–112)
Creatinine, Ser: 0.53 mg/dL (ref 0.4–1.2)
GFR calc Af Amer: >60 mL/min (ref >60)
GFR calc non Af Amer: >60 mL/min (ref >60)
Glucose, Bld: 122 mg/dL — ABNORMAL HIGH (ref 70–99)
Potassium: 3.6 mEq/L (ref 3.5–5.1)
Sodium: 136 mEq/L (ref 135–145)
Total Bilirubin: 0.5 mg/dL (ref 0.3–1.2)
Total Protein: 4.8 g/dL — ABNORMAL LOW (ref 6.0–8.3)

## 2010-07-06 LAB — MAGNESIUM: Magnesium: 2 mg/dL (ref 1.5–2.5)

## 2010-07-07 LAB — CBC
HCT: 26.4 % — ABNORMAL LOW (ref 36.0–46.0)
Hemoglobin: 8.3 g/dL — ABNORMAL LOW (ref 12.0–15.0)
MCH: 26.8 pg (ref 26.0–34.0)
MCHC: 31.4 g/dL (ref 30.0–36.0)
MCV: 85.2 fL (ref 78.0–100.0)
Platelets: 338 10*3/uL (ref 150–400)
RBC: 3.1 MIL/uL — ABNORMAL LOW (ref 3.87–5.11)
RDW: 25.6 % — ABNORMAL HIGH (ref 11.5–15.5)
WBC: 13.5 10*3/uL — ABNORMAL HIGH (ref 4.0–10.5)

## 2010-07-07 LAB — COMPREHENSIVE METABOLIC PANEL
ALT: 11 U/L (ref 0–35)
AST: 17 U/L (ref 0–37)
Albumin: 1.7 g/dL — ABNORMAL LOW (ref 3.5–5.2)
Alkaline Phosphatase: 71 U/L (ref 39–117)
BUN: 11 mg/dL (ref 6–23)
CO2: 28 mEq/L (ref 19–32)
Calcium: 8 mg/dL — ABNORMAL LOW (ref 8.4–10.5)
Chloride: 102 mEq/L (ref 96–112)
Creatinine, Ser: 0.64 mg/dL (ref 0.4–1.2)
GFR calc Af Amer: >60 mL/min (ref >60)
GFR calc non Af Amer: >60 mL/min (ref >60)
Glucose, Bld: 174 mg/dL — ABNORMAL HIGH (ref 70–99)
Potassium: 3.9 mEq/L (ref 3.5–5.1)
Sodium: 138 mEq/L (ref 135–145)
Total Bilirubin: 0.3 mg/dL (ref 0.3–1.2)
Total Protein: 5.3 g/dL — ABNORMAL LOW (ref 6.0–8.3)

## 2010-07-07 LAB — PREALBUMIN: Prealbumin: 7.4 mg/dL — ABNORMAL LOW (ref 17.0–34.0)

## 2010-07-07 LAB — GLUCOSE, CAPILLARY
Glucose-Capillary: 183 mg/dL — ABNORMAL HIGH (ref 70–99)
Glucose-Capillary: 208 mg/dL — ABNORMAL HIGH (ref 70–99)
Glucose-Capillary: 148 mg/dL — ABNORMAL HIGH (ref 70–99)
Glucose-Capillary: 211 mg/dL — ABNORMAL HIGH (ref 70–99)
Glucose-Capillary: 174 mg/dL — ABNORMAL HIGH (ref 70–99)

## 2010-07-07 LAB — PHOSPHORUS: Phosphorus: 3.3 mg/dL (ref 2.3–4.6)

## 2010-07-07 LAB — MAGNESIUM: Magnesium: 2.3 mg/dL (ref 1.5–2.5)

## 2010-07-08 LAB — BASIC METABOLIC PANEL
BUN: 12 mg/dL (ref 6–23)
CO2: 28 mEq/L (ref 19–32)
Calcium: 7.9 mg/dL — ABNORMAL LOW (ref 8.4–10.5)
Chloride: 102 mEq/L (ref 96–112)
Creatinine, Ser: 0.68 mg/dL (ref 0.4–1.2)
GFR calc Af Amer: >60 mL/min (ref >60)
GFR calc non Af Amer: >60 mL/min (ref >60)
Glucose, Bld: 189 mg/dL — ABNORMAL HIGH (ref 70–99)
Potassium: 3.9 mEq/L (ref 3.5–5.1)
Sodium: 137 mEq/L (ref 135–145)

## 2010-07-08 LAB — GLUCOSE, CAPILLARY
Glucose-Capillary: 118 mg/dL — ABNORMAL HIGH (ref 70–99)
Glucose-Capillary: 132 mg/dL — ABNORMAL HIGH (ref 70–99)
Glucose-Capillary: 174 mg/dL — ABNORMAL HIGH (ref 70–99)
Glucose-Capillary: 186 mg/dL — ABNORMAL HIGH (ref 70–99)
Glucose-Capillary: 189 mg/dL — ABNORMAL HIGH (ref 70–99)
Glucose-Capillary: 261 mg/dL — ABNORMAL HIGH (ref 70–99)

## 2010-07-08 LAB — CBC
HCT: 25.2 % — ABNORMAL LOW (ref 36.0–46.0)
Hemoglobin: 8 g/dL — ABNORMAL LOW (ref 12.0–15.0)
MCH: 27.2 pg (ref 26.0–34.0)
MCHC: 31.7 g/dL (ref 30.0–36.0)
MCV: 85.7 fL (ref 78.0–100.0)
Platelets: 337 10*3/uL (ref 150–400)
RBC: 2.94 MIL/uL — ABNORMAL LOW (ref 3.87–5.11)
RDW: 25.1 % — ABNORMAL HIGH (ref 11.5–15.5)
WBC: 11.9 10*3/uL — ABNORMAL HIGH (ref 4.0–10.5)

## 2010-07-08 LAB — MAGNESIUM: Magnesium: 2.4 mg/dL (ref 1.5–2.5)

## 2010-07-09 LAB — GLUCOSE, CAPILLARY
Glucose-Capillary: 142 mg/dL — ABNORMAL HIGH (ref 70–99)
Glucose-Capillary: 161 mg/dL — ABNORMAL HIGH (ref 70–99)
Glucose-Capillary: 161 mg/dL — ABNORMAL HIGH (ref 70–99)
Glucose-Capillary: 123 mg/dL — ABNORMAL HIGH (ref 70–99)

## 2010-07-09 LAB — BASIC METABOLIC PANEL
BUN: 12 mg/dL (ref 6–23)
CO2: 27 mEq/L (ref 19–32)
Calcium: 8.1 mg/dL — ABNORMAL LOW (ref 8.4–10.5)
Chloride: 101 mEq/L (ref 96–112)
Creatinine, Ser: 0.65 mg/dL (ref 0.4–1.2)
GFR calc Af Amer: >60 mL/min (ref >60)
GFR calc non Af Amer: >60 mL/min (ref >60)
Glucose, Bld: 135 mg/dL — ABNORMAL HIGH (ref 70–99)
Potassium: 4.3 mEq/L (ref 3.5–5.1)
Sodium: 136 mEq/L (ref 135–145)

## 2010-07-10 LAB — DIFFERENTIAL
Basophils Absolute: 0.1 10*3/uL (ref 0.0–0.1)
Basophils Relative: 1 % (ref 0–1)
Eosinophils Absolute: 0.2 10*3/uL (ref 0.0–0.7)
Eosinophils Relative: 2 % (ref 0–5)
Lymphocytes Relative: 11 % — ABNORMAL LOW (ref 12–46)
Lymphs Abs: 1.3 10*3/uL (ref 0.7–4.0)
Monocytes Absolute: 1.2 10*3/uL — ABNORMAL HIGH (ref 0.1–1.0)
Monocytes Relative: 10 % (ref 3–12)
Neutro Abs: 9.4 10*3/uL — ABNORMAL HIGH (ref 1.7–7.7)
Neutrophils Relative %: 76 % (ref 43–77)

## 2010-07-10 LAB — CBC
HCT: 27.2 % — ABNORMAL LOW (ref 36.0–46.0)
Hemoglobin: 8.6 g/dL — ABNORMAL LOW (ref 12.0–15.0)
MCH: 26.8 pg (ref 26.0–34.0)
MCHC: 31.6 g/dL (ref 30.0–36.0)
MCV: 84.7 fL (ref 78.0–100.0)
Platelets: 470 10*3/uL — ABNORMAL HIGH (ref 150–400)
RBC: 3.21 MIL/uL — ABNORMAL LOW (ref 3.87–5.11)
RDW: 24.9 % — ABNORMAL HIGH (ref 11.5–15.5)
WBC: 12.2 10*3/uL — ABNORMAL HIGH (ref 4.0–10.5)

## 2010-07-10 LAB — GLUCOSE, CAPILLARY
Glucose-Capillary: 156 mg/dL — ABNORMAL HIGH (ref 70–99)
Glucose-Capillary: 153 mg/dL — ABNORMAL HIGH (ref 70–99)
Glucose-Capillary: 160 mg/dL — ABNORMAL HIGH (ref 70–99)
Glucose-Capillary: 96 mg/dL (ref 70–99)

## 2010-07-10 LAB — PHOSPHORUS: Phosphorus: 4.3 mg/dL (ref 2.3–4.6)

## 2010-07-10 LAB — COMPREHENSIVE METABOLIC PANEL
ALT: 8 U/L (ref 0–35)
AST: 19 U/L (ref 0–37)
Albumin: 1.9 g/dL — ABNORMAL LOW (ref 3.5–5.2)
Alkaline Phosphatase: 60 U/L (ref 39–117)
BUN: 14 mg/dL (ref 6–23)
CO2: 27 mEq/L (ref 19–32)
Calcium: 8.2 mg/dL — ABNORMAL LOW (ref 8.4–10.5)
Chloride: 100 mEq/L (ref 96–112)
Creatinine, Ser: 0.66 mg/dL (ref 0.4–1.2)
GFR calc Af Amer: >60 mL/min (ref >60)
GFR calc non Af Amer: >60 mL/min (ref >60)
Glucose, Bld: 133 mg/dL — ABNORMAL HIGH (ref 70–99)
Potassium: 4.4 mEq/L (ref 3.5–5.1)
Sodium: 134 mEq/L — ABNORMAL LOW (ref 135–145)
Total Bilirubin: 0.2 mg/dL — ABNORMAL LOW (ref 0.3–1.2)
Total Protein: 5.7 g/dL — ABNORMAL LOW (ref 6.0–8.3)

## 2010-07-10 LAB — MAGNESIUM: Magnesium: 2.4 mg/dL (ref 1.5–2.5)

## 2010-07-10 LAB — PREALBUMIN: Prealbumin: 14.1 mg/dL — ABNORMAL LOW (ref 17.0–34.0)

## 2010-07-10 LAB — TRIGLYCERIDES: Triglycerides: 160 mg/dL — ABNORMAL HIGH (ref <150)

## 2010-07-10 LAB — CHOLESTEROL, TOTAL: Cholesterol: 99 mg/dL (ref 0–200)

## 2010-07-11 LAB — GLUCOSE, CAPILLARY
Glucose-Capillary: 123 mg/dL — ABNORMAL HIGH (ref 70–99)
Glucose-Capillary: 123 mg/dL — ABNORMAL HIGH (ref 70–99)
Glucose-Capillary: 119 mg/dL — ABNORMAL HIGH (ref 70–99)
Glucose-Capillary: 136 mg/dL — ABNORMAL HIGH (ref 70–99)

## 2010-07-11 LAB — BASIC METABOLIC PANEL
BUN: 15 mg/dL (ref 6–23)
CO2: 27 mEq/L (ref 19–32)
Calcium: 8.2 mg/dL — ABNORMAL LOW (ref 8.4–10.5)
Chloride: 100 mEq/L (ref 96–112)
Creatinine, Ser: 0.67 mg/dL (ref 0.4–1.2)
GFR calc Af Amer: >60 mL/min (ref >60)
GFR calc non Af Amer: >60 mL/min (ref >60)
Glucose, Bld: 134 mg/dL — ABNORMAL HIGH (ref 70–99)
Potassium: 4.3 mEq/L (ref 3.5–5.1)
Sodium: 134 mEq/L — ABNORMAL LOW (ref 135–145)

## 2010-07-11 LAB — CBC
HCT: 26.9 % — ABNORMAL LOW (ref 36.0–46.0)
Hemoglobin: 8.4 g/dL — ABNORMAL LOW (ref 12.0–15.0)
MCH: 26.7 pg (ref 26.0–34.0)
MCHC: 31.2 g/dL (ref 30.0–36.0)
MCV: 85.4 fL (ref 78.0–100.0)
Platelets: 556 10*3/uL — ABNORMAL HIGH (ref 150–400)
RBC: 3.15 MIL/uL — ABNORMAL LOW (ref 3.87–5.11)
RDW: 24.9 % — ABNORMAL HIGH (ref 11.5–15.5)
WBC: 10.5 10*3/uL (ref 4.0–10.5)

## 2010-07-11 LAB — PHOSPHORUS: Phosphorus: 4.2 mg/dL (ref 2.3–4.6)

## 2010-07-11 LAB — MAGNESIUM: Magnesium: 2.6 mg/dL — ABNORMAL HIGH (ref 1.5–2.5)

## 2010-07-11 LAB — PREALBUMIN: Prealbumin: 15 mg/dL — ABNORMAL LOW (ref 17.0–34.0)

## 2010-07-11 LAB — ALBUMIN: Albumin: 1.9 g/dL — ABNORMAL LOW (ref 3.5–5.2)

## 2010-07-12 LAB — CBC
HCT: 30 % — ABNORMAL LOW (ref 36.0–46.0)
Hemoglobin: 9.2 g/dL — ABNORMAL LOW (ref 12.0–15.0)
MCH: 26.3 pg (ref 26.0–34.0)
MCHC: 30.7 g/dL (ref 30.0–36.0)
MCV: 85.7 fL (ref 78.0–100.0)
Platelets: 680 10*3/uL — ABNORMAL HIGH (ref 150–400)
RBC: 3.5 MIL/uL — ABNORMAL LOW (ref 3.87–5.11)
RDW: 25.1 % — ABNORMAL HIGH (ref 11.5–15.5)
WBC: 12.2 10*3/uL — ABNORMAL HIGH (ref 4.0–10.5)

## 2010-07-12 LAB — GLUCOSE, CAPILLARY: Glucose-Capillary: 126 mg/dL — ABNORMAL HIGH (ref 70–99)

## 2010-07-16 NOTE — Discharge Summary (Signed)
[name], [name]           ACCOUNT NO.:  [id number]  MEDICAL RECORD NO.:  [id number]           PATIENT TYPE:  I  LOCATION:  1525                         FACILITY:  [facility]  PHYSICIAN:  Almond Lint, MD       DATE OF BIRTH:  May 28, 1941  DATE OF ADMISSION:  06/27/2010 DATE OF DISCHARGE:  07/12/2010                              DISCHARGE SUMMARY   PRINCIPAL DIAGNOSIS:  Gastrointestinal stromal tumor, T2 N0 M0, of the duodenum.  SECONDARY DIAGNOSES: 1. Hypertension. 2. Coronary artery disease. 3. Gastric ulcer. 4. Gastroesophageal reflux disease. 5. Colon polyps. 6. Anemia. 7. Joint pain. 8. Peripheral vascular disease. 9. Hyperlipidemia.  DISCHARGE MEDICATIONS:  Include: 1. Oxycodone elixir 5 to 10 mg q.4 h p.r.n. 2. Augmentin 875 one to two tablets p.o. b.i.d. x1 week. 3. Metoprolol XL 75 mg a day, 50 in the morning and 25 in the evening. 4. Acyclovir 400 mg once a day. 5. Protonix 40 mg once a day. 6. Simvastatin 20 mg once a day. 7. Iron sulfate 325 mg twice a day. 8. Multivitamins once a day. 9. Vitamin B complex once a day. 10.Vitamin B12 once a day. 11.Vitamin D3 once a day. 12.Travatan as needed. 13.Tylenol PM as needed. 14.Quinine at bedtime. 15.Aspirin 81 mg.  PRINCIPAL LABS:  CBC prior to discharge, white count 12.2, hematocrit 30, platelet count 680.  Low hemoglobin was 8.0, again back up to 9.2 prior to discharge.  White count as high as 20.5.  HOSPITAL COURSE:  [name] was admitted to ICU following surgery. She experienced low urine output which resolved with some albumin IV. She had aggressive pulmonary toilet as well.  She had hyperglycemia and was treated with sliding scale insulin.  She was on Lovenox for VTE prophylaxis.  She was quite anxious the first night and required low-dose Ativan. Once her Foley was pulled, she was able to void spontaneously.  She did require staying in step-down for around 5 days for significant  pulmonary toilet.  Her NG tube was pulled without difficulty.  She did start to develop some unusual looking drain output which was slightly high in amount and had an  amylase only around 700.    Once her white count got to 20 and she had temperatures around 100, she was started on antibiotics and Sandostatin and kept n.p.o. for several days for a low-grade leak.  She had TNA for protein-calorie malnutrition.  This improved and her white count came down and temperatures came down.  After 48 hours of improvement, she was started back on clears and her diet was advanced without difficulty. She remained afebrile for the duration.  Her TNA was stopped and her sugars remained in the low 100s.  She was transitioned to oral antibiotics and did not have any fevers.  She is going to be discharged to home in improved condition.  Her drains will stay in and she will measure and report them twice a day.  She has a followup appointment with me on July 20, 2010, at 9:30 a.m.  I do not want her to drive for several weeks and no return to work for total  of around 12 weeks after surgery.     Almond Lint, MD     FB/MEDQ  D:  07/12/2010  T:  07/12/2010  Job:  [id number]  Electronically Signed by Almond Lint MD on 07/16/2010 12:32:43 AM

## 2010-07-27 DIAGNOSIS — I82409 Acute embolism and thrombosis of unspecified deep veins of unspecified lower extremity: Secondary | ICD-10-CM

## 2010-07-27 HISTORY — DX: Acute embolism and thrombosis of unspecified deep veins of unspecified lower extremity: I82.409

## 2010-08-09 LAB — CBC
HCT: 32.3 % — ABNORMAL LOW (ref 36.0–46.0)
Hemoglobin: 9.7 g/dL — ABNORMAL LOW (ref 12.0–15.0)
MCH: 21.8 pg — ABNORMAL LOW (ref 26.0–34.0)
MCHC: 30 g/dL (ref 30.0–36.0)
MCV: 72.7 fL — ABNORMAL LOW (ref 78.0–100.0)
Platelets: 284 10*3/uL (ref 150–400)
RBC: 4.44 MIL/uL (ref 3.87–5.11)
RDW: 18.5 % — ABNORMAL HIGH (ref 11.5–15.5)
WBC: 6.5 10*3/uL (ref 4.0–10.5)

## 2010-08-09 LAB — COMPREHENSIVE METABOLIC PANEL
ALT: 15 U/L (ref 0–35)
AST: 27 U/L (ref 0–37)
Albumin: 4 g/dL (ref 3.5–5.2)
Alkaline Phosphatase: 73 U/L (ref 39–117)
BUN: 9 mg/dL (ref 6–23)
CO2: 25 mEq/L (ref 19–32)
Calcium: 8.8 mg/dL (ref 8.4–10.5)
Chloride: 106 mEq/L (ref 96–112)
Creatinine, Ser: 0.77 mg/dL (ref 0.4–1.2)
GFR calc Af Amer: >60 mL/min (ref >60)
GFR calc non Af Amer: >60 mL/min (ref >60)
Glucose, Bld: 109 mg/dL — ABNORMAL HIGH (ref 70–99)
Potassium: 4 mEq/L (ref 3.5–5.1)
Sodium: 138 mEq/L (ref 135–145)
Total Bilirubin: 0.6 mg/dL (ref 0.3–1.2)
Total Protein: 7 g/dL (ref 6.0–8.3)

## 2010-08-09 LAB — LIPASE, BLOOD: Lipase: 23 U/L (ref 11–59)

## 2010-08-09 LAB — DIFFERENTIAL
Basophils Absolute: 0 10*3/uL (ref 0.0–0.1)
Basophils Relative: 1 % (ref 0–1)
Eosinophils Absolute: 0.1 10*3/uL (ref 0.0–0.7)
Eosinophils Relative: 1 % (ref 0–5)
Lymphocytes Relative: 19 % (ref 12–46)
Lymphs Abs: 1.2 10*3/uL (ref 0.7–4.0)
Monocytes Absolute: 0.4 10*3/uL (ref 0.1–1.0)
Monocytes Relative: 7 % (ref 3–12)
Neutro Abs: 4.7 10*3/uL (ref 1.7–7.7)
Neutrophils Relative %: 73 % (ref 43–77)

## 2010-08-09 LAB — AMYLASE: Amylase: 60 U/L (ref 0–105)

## 2010-09-22 ENCOUNTER — Other Ambulatory Visit: Payer: Self-pay | Admitting: Internal Medicine

## 2010-09-22 DIAGNOSIS — Z1231 Encounter for screening mammogram for malignant neoplasm of breast: Secondary | ICD-10-CM

## 2010-09-26 ENCOUNTER — Other Ambulatory Visit: Payer: Self-pay | Admitting: Internal Medicine

## 2010-09-26 ENCOUNTER — Ambulatory Visit
Admission: RE | Admit: 2010-09-26 | Discharge: 2010-09-26 | Disposition: A | Discharge status: Home / Self Care | Payer: BC Managed Care – PPO | Source: Ambulatory Visit | Attending: Internal Medicine | Admitting: Internal Medicine

## 2010-09-26 DIAGNOSIS — Z1231 Encounter for screening mammogram for malignant neoplasm of breast: Secondary | ICD-10-CM

## 2010-10-10 NOTE — Consult Note (Signed)
NAMEGREGORY, [name] NO.:  [id number]   MEDICAL RECORD NO.:  [id number]          PATIENT TYPE:  INP   LOCATION:  1833                         FACILITY:  [facility]   PHYSICIAN:  Lyn Records, M.D.   DATE OF BIRTH:  07/30/1940   DATE OF CONSULTATION:  DATE OF DISCHARGE:                                 CONSULTATION   INDICATION FOR CONSULTATION:  Recurrent chest discomfort.   CONCLUSIONS:  1. Crescendo angina/unstable angina approximately 2 weeks in duration      likely secondary to underlying coronary artery disease.  2. History of recurrent GI bleeding from gastric ulcers.  3. Strong family history of premature coronary atherosclerosis.   RECOMMENDATIONS:  1. Antithrombotic therapy with Lovenox.  2. Low-dose aspirin therapy.  3. Cardiac catheterization in a.m.  4. Check statin and CRP level.  5. If the patient undergoes percutaneous coronary intervention, bare      metal stenting is likely, the most appropriate choice given her GI      history of bleeding.   COMMENTS:  The patient is 70 and has admitted to the hospital with  recurring chest discomfort.  Two weeks ago, she noticed a severe episode  of pressure in the chest that occurred while she was gathering leaves  while helping her husband work in the yard.  Since that time, she has  not felt well.  For 2 days after that she laid around.  She was weak and  tired and short of breath.  She has had recurring episodes of chest  pressure that have not been is prolonged or as severe lasting usually up  to 5 or 6 minutes and resolving.  Prior to this severe episode 2 weeks  ago, she did have some episodes of discomfort that were felt to be GI  related.  She has a significant gastrointestinal bleeding history with  H. pylori positive gastric ulcers that bled into 2001 and again in 2003.  No recent bleeding or melena.   ALLERGIES:  None known.   MEDICATIONS:  1. Protonix 40 mg per day.  2. Valtrex.   SIGNIFICANT PAST MEDICAL PROBLEMS:  1. History of GI bleed 2001 and 2003.  2. History of tubal ligation.  3. History of Mohs surgery on forehead.   FAMILY HISTORY:  Father died with myocardial infarction at age 83,  brother died age 72 of sudden death and myocardial infarction.   HABITS:  Does not smoke or drink.  Does not use illicit substances.   REVIEW OF SYSTEMS:  The patient denies hypertension, hyperlipidemia,  diabetes.   PHYSICAL EXAMINATION:  GENERAL:  The patient is in no distress.  VITAL SIGNS:  Her blood pressure is 118/70, heart rate is 80.  NECK:  Vein is not distended.  HEENT:  No xanthelasma noted.  LUNGS:  Clear.  CARDIAC:  No rub, no click, no gallop.  No carotid bruits are heard.  ABDOMEN:  Soft.  EXTREMITIES:  No edema.  Pulses are 2+ and symmetric in the upper and  lower extremities.  NEUROLOGICAL:  Normal.   LABORATORY DATA:  EKG is normal.  Laboratory data  is pending.   DISCUSSION:  The patient has unstable angina.  Diagnostic coronary  angiography will provide anatomic informations and hopefully the  opportunity to perform PCI that will decrease her risk of having  myocardial infarction or acute ischemic injury.  She is instructed on  coronary angiography and the risks attendant with this procedure and PCI  including death, surgery, myocardial infarction, bleeding, allergy, etc.      Lyn Records, M.D.  Electronically Signed     HWS/MEDQ  D:  04/30/2007  T:  05/01/2007  Job:  [id number]   cc:   Theressa Millard, M.D.

## 2010-10-10 NOTE — Cardiovascular Report (Signed)
NAMEOSWIN, JOHAL NO.:  [id number]   MEDICAL RECORD NO.:  [id number]          PATIENT TYPE:  INP   LOCATION:  2003                         FACILITY:  [facility]   PHYSICIAN:  Lyn Records, M.D.   DATE OF BIRTH:  06-01-1940   DATE OF PROCEDURE:  05/01/2007  DATE OF DISCHARGE:                            CARDIAC CATHETERIZATION   INDICATION FOR STUDY:  Crescendo angina.   PROCEDURES PERFORMED:  1. Left heart catheterization.  2. Selective coronary angiography.  3. Left ventriculography.  4. Intracoronary nitroglycerin.  5. Angio-Seal.   DESCRIPTION:  After informed consent, a 6-French sheath was placed in  the right femoral artery using the modified Seldinger technique.  A 6-  [place] A2 multipurpose catheter was used for hemodynamic recordings,  left ventriculography by hand injection, and selective right coronary  angiography.  A #4 left Judkins 6-French catheter was used for left  coronary angiography.  The patient tolerated the procedure without  complications.   Angio-Seal was used for hemostasis with good result.   The patient did receive 200 mcg of intracoronary nitroglycerin during  left coronary injections.   RESULTS:  1. Hemodynamic data:      a.     Aortic pressure 112/60.      b.     Left ventricular pressure 121/10.  2. Left ventriculography:  Left ventricular cavity size and systolic      function are normal.  EF is 55%.  3. Coronary angiography.      a.     Left main coronary:  Large and widely patent.      b.     Left anterior descending coronary:  The LAD contains severe       disease.  Ostium contains eccentric narrowing of the left main,       demonstrated only in the LAO cranial views.  This lesion is around       50%.  The mid LAD is diffusely diseased.  The first diagonal       arises near the ostium of the LAD that is widely patent.  The       second diagonal arises from the mid vessel and is large and       trifurcates on the  left lateral wall.  The LAD at the bifurcation       of the second diagonal is severely diseased segmentally with a 90%       stenosis.  The LAD reaches the left ventricular apex.  This       segmental disease of the LAD just beyond this large diagonal is       the likely source of the patient's continued angina.      c.     Circumflex artery:  The circumflex coronary artery gives       origin to 4 obtuse marginal branches.  The second and third obtuse       marginals contain proximal irregularities of 30%.  No high-grade       obstruction.  4. Right coronary:  The right coronary artery is large giving the PDA  and contains proximal luminal irregularities.   CONCLUSION:  1. Significant coronary artery disease involving the mid left anterior      descending just distal to the bifurcation with a large second      diagonal.  This makes the left anterior descending lesion difficult      to treat with percutaneous therapy, as stenting would result in      jailing of the large diagonal branch.  At this point, it would      appear that the most prudent approach would be attempted medical      therapy and if the patient has difficulty, then consider      percutaneous coronary intervention.  Again, the patient will not be      a candidate for drug-eluting stents because of her gastrointestinal      bleeding history and therefore, restenosis with bare metal stents      would be higher in the subset as well.  2. Normal left ventricular function.   PLAN:  1. Medical therapy.  Start statin therapy.  Start Plavix if possible;      this may cause less chance of bleeding than aspirin.  Start long-      acting nitrates and/or beta-blocker therapy.  Discharge in the next      12-24 hours.  2. Consideration of stress Cardiolite to demonstrate region of      ischemia.  If it is only apical ischemia, it means that we are      correct that the mid-LAD lesion is the problem.  If the whole LAD       territory is ischemic, it will raise a question about the eccentric      ostial LAD as the culprit lesion (I doubt this at this point).  If      ostial LAD is a problem, surgery would end up being a better      treatment option.      Lyn Records, M.D.  Electronically Signed     HWS/MEDQ  D:  05/01/2007  T:  05/01/2007  Job:  [id number]   cc:   Theressa Millard, M.D.

## 2010-10-10 NOTE — Discharge Summary (Signed)
Carol, Foley NO.:  [id number]   MEDICAL RECORD NO.:  [id number]          PATIENT TYPE:  INP   LOCATION:  2003                         FACILITY:  [facility]   PHYSICIAN:  Theressa Millard, M.D.    DATE OF BIRTH:  09/04/1940   DATE OF ADMISSION:  04/30/2007  DATE OF DISCHARGE:  05/02/2007                               DISCHARGE SUMMARY   ADMITTING DIAGNOSIS:  Chest pain, unstable angina.   DISCHARGE DIAGNOSES:  1. Unstable and accelerating angina.  2. Coronary artery disease with left anterior descending coronary      artery disease at the ostium of around 50%, as well as a 90%      stenosis at the bifurcation of the second diagonal, also 30% left      circumflex artery lesion.  3. History of recurrent Herpes simplex.  4. History of recurrent gastrointestinal bleeding from gastric ulcers.   The patient is a 70 year old white female that came in with a two-week  history of chest discomfort.  This initially started with a prolonged  episode 2 weeks prior to admission, and since then the patient has had  exertional episodes of chest heaviness associated with shortness of  breath.   HOSPITAL COURSE:  The patient was admitted, and on the basis of serial  enzymes and EKGs a myocardial infarction was ruled out.  There was no  arrhythmias noted, while the patient was in the hospital.  She was seen  in consultation by Dr. Verdis Prime and underwent coronary arteriography  on May 01, 2007.  This resulted in the following findings:  Normal  left ventricular function; significant coronary disease involving the  mid-left anterior descending coronary artery, just distal to the  bifurcation with a large second diagonal.  This would make treatment  percutaneously of the LAD lesion difficult, because the large diagonal  branch would be jailed.  Medical therapy therefore recommended.   The patient tolerated the catheterization easily and had no  complications.   Dr.  Katrinka Blazing also observed that if the patient ever needed a stent placed,  it should be a bare mental stent and not and a drug-eluting stent,  because of her problems with GI bleeding.  Along those similar lines,  Dr. Katrinka Blazing and I discussed her antiplatelet therapy and opted for Plavix,  as opposed to aspirin and without aspirin.  Therefore, the patient is  discharged on the medications listed below.   DISCHARGE MEDICATIONS:  1. Simvastatin 20 mg daily.  2. Plavix 75 mg daily.  3. Metoprolol 50 mg one in the morning, 1/2 in the evening.  4. Acyclovir 400 mg b.i.d.  5. Nitroglycerin 0.4 mg sublingual p.r.n.  Use explained to the      patient.  6. Protonix 40 mg daily.   FOLLOW UP:  Dr. Michaelle Copas office will call her to make an appointment for  a stress Cardiolite.   She may return to work on May 06, 2007.   DIET:  No added salt, modified fat.      Theressa Millard, M.D.  Electronically Signed     JO/MEDQ  D:  05/02/2007  T:  05/02/2007  Job:  [id number]

## 2010-10-13 NOTE — Discharge Summary (Signed)
NAMESRAVYA, GRISSOM             ACCOUNT NO.:  [id number]   MEDICAL RECORD NO.:  [id number]            PATIENT TYPE:   LOCATION:                                 FACILITY:   PHYSICIAN:  Theressa Millard, M.D.         DATE OF BIRTH:   DATE OF ADMISSION:  04/30/2007  DATE OF DISCHARGE:  05/02/2007                               DISCHARGE SUMMARY   ADMITTING DIAGNOSIS:  Unstable angina.   DISCHARGE DIAGNOSIS:  1. Unstable angina secondary to coronary artery disease.  2. Coronary artery disease with severe mid LAD disease and a moderate      ostial LAD lesion.  3. History of GI bleed secondary to H.  Pylori gastric ulcer as well      as secondary to nonsteroidal anti-inflammatory drugs in the past.   HOSPITAL COURSE:  The patient is a 62-year white female who developed an  episode of chest discomfort about 2 weeks prior to admission.  Subsequent to that had exertional fatigue and shortness of breath.  She  was admitted for further evaluation.   The patient was admitted on the basis of serial enzymes and EKGs.  A  myocardial infarction was ruled out.  She was seen in consultation by  cardiology and because of the typical nature of her discomfort and  thought that this reflected unstable angina.  She was taken to cath lab  where she underwent coronary arteriography on May 01, 2007.  This  revealed findings of an osteal eccentric 50% LAD lesion, a mid 90%  lesion with a large diagonal and was 90% lesion of the second septal  perforator.  None of these were favorable for percutaneous  interventions.   She was started on various medications in attempt to suppress her  symptoms and if this can be done, she will not have any percutaneous  interventions on the present lesion.   MEDICATIONS AT DISCHARGE:  1. Protonix 40 mg daily.  2. Acyclovir 400 mg b.i.d.  3. Metoprolol 50 mg one in the morning, 1/2 in the evening.  4. Plavix 75 mg daily.  5. Simvastatin 20 mg daily.  6.  Nitroglycerin 0.4 mg sublingual p.r.n.   She will make an appointment to see Dr. Katrinka Blazing in follow-up as a nuclear  cardiology test needs to be scheduled in order to look for areas of  ischemia related to the lesions above.      Theressa Millard, M.D.  Electronically Signed    JO/MEDQ  D:  05/23/2007  T:  05/23/2007  Job:  [id number]

## 2010-10-13 NOTE — Op Note (Signed)
[facility]  Patient:    [name], [name]               MRN: [id number] Proc. Date: 10/04/99 Adm. Date:  [id number] Disc. Date: [id number] Attending:  Louie Bun CC:         Winn Jock. Earl Gala, M.D.                           Operative Report  INDICATION FOR PROCEDURE:  History of recent GI bleeding with upper GI endoscopy not conclusive for an upper tract source, despite the finding of two very small clean-based ulcers.  Procedure is to assess for lower GI causes of bleeding.  PROCEDURE:  Colonoscopy.  ENDOSCOPIST:  Everardo All. Madilyn Fireman, M.D.  DESCRIPTION OF PROCEDURE:  The patient was placed in the left lateral decubitus position and placed on the pulse monitor with continuous low-flow oxygen delivered by nasal cannula.  She was sedated with 75 mg IV Demerol and 8 mg IV Versed.  The Olympus video colonoscope was inserted into the rectum and advanced to the cecum, confirmed by transillumination of McBurneys point and visualization of the ileocecal valve and appendiceal orifice.  The prep was excellent.  The cecum, ascending, transverse, descending and sigmoid colon all appeared normal with no masses, polyps, diverticula or other mucosal abnormalities.  The rectum likewise appeared normal and a retroflexed view of the anus did reveal some small internal hemorrhoids.  The colonoscope was then withdrawn and the patient returned to the recovery room in stable condition. She tolerated the procedure well and there were no immediate complications.  IMPRESSION:  Internal hemorrhoids; otherwise, normal colonoscopy. DD:  10/04/99 TD:  10/06/99 Job: [id number]

## 2010-10-19 ENCOUNTER — Other Ambulatory Visit: Payer: Self-pay | Admitting: Internal Medicine

## 2010-10-19 DIAGNOSIS — R928 Other abnormal and inconclusive findings on diagnostic imaging of breast: Secondary | ICD-10-CM

## 2010-10-27 ENCOUNTER — Other Ambulatory Visit: Payer: Self-pay | Admitting: Obstetrics and Gynecology

## 2010-10-27 ENCOUNTER — Other Ambulatory Visit (HOSPITAL_COMMUNITY)
Admission: RE | Admit: 2010-10-27 | Discharge: 2010-10-27 | Disposition: A | Discharge status: Home / Self Care | Payer: BC Managed Care – PPO | Source: Ambulatory Visit | Attending: Obstetrics and Gynecology | Admitting: Obstetrics and Gynecology

## 2010-10-27 DIAGNOSIS — Z124 Encounter for screening for malignant neoplasm of cervix: Secondary | ICD-10-CM | POA: Insufficient documentation

## 2010-10-30 ENCOUNTER — Ambulatory Visit
Admission: RE | Admit: 2010-10-30 | Discharge: 2010-10-30 | Disposition: A | Discharge status: Home / Self Care | Payer: BC Managed Care – PPO | Source: Ambulatory Visit | Attending: Internal Medicine | Admitting: Internal Medicine

## 2010-10-30 DIAGNOSIS — R928 Other abnormal and inconclusive findings on diagnostic imaging of breast: Secondary | ICD-10-CM

## 2010-11-20 ENCOUNTER — Encounter (INDEPENDENT_AMBULATORY_CARE_PROVIDER_SITE_OTHER): Payer: Self-pay | Admitting: General Surgery

## 2010-11-20 ENCOUNTER — Ambulatory Visit (INDEPENDENT_AMBULATORY_CARE_PROVIDER_SITE_OTHER): Payer: BC Managed Care – PPO | Admitting: General Surgery

## 2010-11-20 DIAGNOSIS — R071 Chest pain on breathing: Secondary | ICD-10-CM | POA: Insufficient documentation

## 2010-11-20 DIAGNOSIS — C49A0 Gastrointestinal stromal tumor, unspecified site: Secondary | ICD-10-CM | POA: Insufficient documentation

## 2010-11-20 DIAGNOSIS — R1013 Epigastric pain: Secondary | ICD-10-CM | POA: Insufficient documentation

## 2010-11-20 DIAGNOSIS — C494 Malignant neoplasm of connective and soft tissue of abdomen: Secondary | ICD-10-CM

## 2010-11-20 LAB — BASIC METABOLIC PANEL
BUN: 16 mg/dL (ref 6–23)
CO2: 25 mEq/L (ref 19–32)
Calcium: 9.4 mg/dL (ref 8.4–10.5)
Chloride: 105 mEq/L (ref 96–112)
Creat: 0.78 mg/dL (ref 0.50–1.10)
Glucose, Bld: 93 mg/dL (ref 70–99)
Potassium: 4.3 mEq/L (ref 3.5–5.3)
Sodium: 140 mEq/L (ref 135–145)

## 2010-11-20 LAB — LIPASE: Lipase: 38 U/L (ref 0–75)

## 2010-11-20 LAB — HEPATIC FUNCTION PANEL
ALT: 23 U/L (ref 0–35)
AST: 27 U/L (ref 0–37)
Albumin: 4.4 g/dL (ref 3.5–5.2)
Alkaline Phosphatase: 111 U/L (ref 39–117)
Bilirubin, Direct: 0.1 mg/dL (ref 0.0–0.3)
Indirect Bilirubin: 0.4 mg/dL (ref 0.0–0.9)
Total Bilirubin: 0.5 mg/dL (ref 0.3–1.2)
Total Protein: 7.4 g/dL (ref 6.0–8.3)

## 2010-11-20 LAB — CBC WITH DIFFERENTIAL/PLATELET
Basophils Absolute: 0 10*3/uL (ref 0.0–0.1)
Basophils Relative: 0 % (ref 0–1)
Eosinophils Absolute: 0.5 10*3/uL (ref 0.0–0.7)
Eosinophils Relative: 6 % — ABNORMAL HIGH (ref 0–5)
HCT: 41.3 % (ref 36.0–46.0)
Hemoglobin: 13.1 g/dL (ref 12.0–15.0)
Lymphocytes Relative: 21 % (ref 12–46)
Lymphs Abs: 1.8 10*3/uL (ref 0.7–4.0)
MCH: 29 pg (ref 26.0–34.0)
MCHC: 31.7 g/dL (ref 30.0–36.0)
MCV: 91.4 fL (ref 78.0–100.0)
Monocytes Absolute: 0.7 10*3/uL (ref 0.1–1.0)
Monocytes Relative: 8 % (ref 3–12)
Neutro Abs: 5.4 10*3/uL (ref 1.7–7.7)
Neutrophils Relative %: 64 % (ref 43–77)
Platelets: 293 10*3/uL (ref 150–400)
RBC: 4.52 MIL/uL (ref 3.87–5.11)
RDW: 15.7 % — ABNORMAL HIGH (ref 11.5–15.5)
WBC: 8.4 10*3/uL (ref 4.0–10.5)

## 2010-11-20 NOTE — Progress Notes (Signed)
Chief Complaint  Patient presents with  . Back Pain  . Chest Pain   History of Present Illness:   [name] is now 5 months post whipple.  She originally had a low grade pancreatic leak and required prolonged drain placement.  She went to [place] for her mother's 90th birthday party and got a DVT, PE, and was hospitalized for the above.  She was treated with IV antibiotics as well because of some post op fluid in the pancreatic bed, despite the fact that the CT was not abnormal for the post op course.  Since being home, she had gradually returned to near normal for the last 2 months.  Around 3 weeks ago, she started having severe epigastric and chest pain that radiates to both shoulders and back.  She has had some nausea with this.  She has not noticed a difference with types of food.  She has been having normal bowel movements.  She has experienced some nausea.  The pain does get better sometimes with slow deep breaths.  She denies fever/chills.    Past Medical History  Diagnosis Date  . Ulcer   . Reflux   . Cancer   . GIST (gastrointestinal stromal tumor), malignant     T2N0  . DVT of lower extremity (deep venous thrombosis) March 2012    Past Surgical History  Procedure Date  . Breast surgery   . Pancreaticoduodenectomy 06/27/2010    Outpatient Encounter Prescriptions as of 11/20/2010  Medication Sig Dispense Refill  . acyclovir (ZOVIRAX) 400 MG tablet Take 400 mg by mouth 1 dose over 24 hours.        . ferrous sulfate 325 (65 FE) MG tablet Take 325 mg by mouth 1 dose over 24 hours.        . multivitamin-iron-minerals-folic acid (CENTRUM) chewable tablet Chew 1 tablet by mouth daily.        . pantoprazole (PROTONIX) 40 MG tablet Take 40 mg by mouth daily.        . simvastatin (ZOCOR) 20 MG tablet Take 20 mg by mouth 1 dose over 24 hours.          Review of Systems  Constitutional: Positive for chills. Negative for fever, diaphoresis, activity change, appetite change and fatigue.   HENT: Negative.   Eyes: Negative.   Respiratory: Positive for chest tightness and shortness of breath. Negative for apnea, cough, choking, wheezing and stridor.   Cardiovascular: Positive for chest pain and leg swelling. Negative for palpitations.  Gastrointestinal: Positive for nausea and abdominal pain. Negative for vomiting, diarrhea, constipation, blood in stool, abdominal distention, anal bleeding and rectal pain.  Genitourinary: Negative.   Musculoskeletal: Negative.   Skin: Negative.   Neurological: Negative.   Hematological: Bruises/bleeds easily.  Psychiatric/Behavioral: Negative.      No Known Allergies  No family history on file.  Exam She is well developed and well nourished.  Well groomed. Chest is non tender Heart is regular Abd:  Soft/non tender, non distended, no hernia, no hepatosplenomegaly Ext:  Warm, well perfused, no edema Psych:  Mood/affect normal Neck:  Supple, no LAD   Patient Active Problem List  Diagnoses  . Chest pain  . GIST, malignant T2N0  . Abdominal pain, epigastric   Orders Placed This Encounter  Procedures  . CT Angio Chest W/Cm &/Or Wo Cm    Please obtain in next 48 hours    Order Specific Question:  Preferred imaging location?    Answer:  [facility]  Order Specific Question:  Reason for exam:    Answer:  Chest pain, r/o PE, h/o DVT  . CT Abdomen Pelvis W Contrast    Please obtain within 48 hours    Order Specific Question:  Preferred imaging location?    Answer:  [facility]    Order Specific Question:  Reason for exam:    Answer:  abd pain, s/p whipple ORAL AND IV CONTRAST  . Hepatic function panel  . Lipase  . Basic metabolic panel  . CBC with Differential  . EKG 12-Lead    Scheduling Instructions:     Dr. Sherilyn Cooter Smith's office

## 2010-11-20 NOTE — Progress Notes (Deleted)
Subjective:     Patient ID: [name], female   DOB: 1940/09/03, 70 y.o.   MRN: [id number]    BP 112/72  Pulse 66  Temp(Src) 98.6 F (37 C) (Temporal)  Ht 5\' 5"  (1.651 m)  Wt 156 lb 3.2 oz (70.852 kg)  BMI 25.99 kg/m2    HPI   Review of Systems  Constitutional: Positive for chills. Negative for fever, diaphoresis, activity change, appetite change and fatigue.  HENT: Negative.   Eyes: Negative.   Respiratory: Positive for chest tightness and shortness of breath. Negative for apnea, cough, choking, wheezing and stridor.   Cardiovascular: Positive for chest pain and leg swelling. Negative for palpitations.  Gastrointestinal: Positive for nausea and abdominal pain. Negative for vomiting, diarrhea, constipation, blood in stool, abdominal distention, anal bleeding and rectal pain.  Genitourinary: Negative.   Musculoskeletal: Negative.   Skin: Negative.   Neurological: Negative.   Hematological: Bruises/bleeds easily.  Psychiatric/Behavioral: Negative.        Objective:   Physical Exam     Assessment:     ***    Plan:     ***

## 2010-11-20 NOTE — Assessment & Plan Note (Addendum)
Pt has history of DVT, will rule out PE Obtain EKG

## 2010-11-20 NOTE — Assessment & Plan Note (Addendum)
R/O pseudocyst or retained fluid collection Check CBC and electrolytes, liver function tests, and lipase

## 2010-11-21 ENCOUNTER — Ambulatory Visit: Admission: RE | Admit: 2010-11-21 | Payer: Self-pay | Source: Ambulatory Visit

## 2010-11-21 ENCOUNTER — Ambulatory Visit
Admission: RE | Admit: 2010-11-21 | Discharge: 2010-11-21 | Disposition: A | Discharge status: Home / Self Care | Payer: Self-pay | Source: Ambulatory Visit | Attending: General Surgery | Admitting: General Surgery

## 2010-11-21 ENCOUNTER — Other Ambulatory Visit (INDEPENDENT_AMBULATORY_CARE_PROVIDER_SITE_OTHER): Payer: Self-pay | Admitting: General Surgery

## 2010-11-21 MED ORDER — IOHEXOL 300 MG/ML  SOLN
100.0000 mL | Freq: Once | INTRAMUSCULAR | Status: AC | PRN
  Administered 2010-11-21: 100 mL via INTRAVENOUS

## 2010-11-22 ENCOUNTER — Inpatient Hospital Stay (HOSPITAL_COMMUNITY)
Admission: AD | Admit: 2010-11-22 | Discharge: 2010-11-23 | DRG: 078 | Disposition: A | Discharge status: Home / Self Care | Payer: BC Managed Care – PPO | Source: Ambulatory Visit | Attending: Internal Medicine | Admitting: Internal Medicine

## 2010-11-22 DIAGNOSIS — I739 Peripheral vascular disease, unspecified: Secondary | ICD-10-CM | POA: Diagnosis present

## 2010-11-22 DIAGNOSIS — I2699 Other pulmonary embolism without acute cor pulmonale: Principal | ICD-10-CM | POA: Diagnosis present

## 2010-11-22 DIAGNOSIS — K219 Gastro-esophageal reflux disease without esophagitis: Secondary | ICD-10-CM | POA: Diagnosis present

## 2010-11-22 DIAGNOSIS — I2510 Atherosclerotic heart disease of native coronary artery without angina pectoris: Secondary | ICD-10-CM | POA: Diagnosis present

## 2010-11-22 DIAGNOSIS — K3184 Gastroparesis: Secondary | ICD-10-CM | POA: Diagnosis present

## 2010-11-22 DIAGNOSIS — T81718A Complication of other artery following a procedure, not elsewhere classified, initial encounter: Secondary | ICD-10-CM

## 2010-11-22 DIAGNOSIS — Z7901 Long term (current) use of anticoagulants: Secondary | ICD-10-CM

## 2010-11-22 LAB — COMPREHENSIVE METABOLIC PANEL
ALT: 20 U/L (ref 0–35)
AST: 22 U/L (ref 0–37)
Albumin: 3.5 g/dL (ref 3.5–5.2)
Alkaline Phosphatase: 112 U/L (ref 39–117)
BUN: 9 mg/dL (ref 6–23)
CO2: 27 mEq/L (ref 19–32)
Calcium: 9.1 mg/dL (ref 8.4–10.5)
Chloride: 102 mEq/L (ref 96–112)
Creatinine, Ser: 0.6 mg/dL (ref 0.50–1.10)
GFR calc Af Amer: >60 mL/min (ref >60)
GFR calc non Af Amer: >60 mL/min (ref >60)
Glucose, Bld: 71 mg/dL (ref 70–99)
Potassium: 3.4 mEq/L — ABNORMAL LOW (ref 3.5–5.1)
Sodium: 140 mEq/L (ref 135–145)
Total Bilirubin: 0.3 mg/dL (ref 0.3–1.2)
Total Protein: 7.1 g/dL (ref 6.0–8.3)

## 2010-11-22 LAB — DIFFERENTIAL
Basophils Absolute: 0 10*3/uL (ref 0.0–0.1)
Basophils Relative: 0 % (ref 0–1)
Eosinophils Absolute: 0.5 10*3/uL (ref 0.0–0.7)
Eosinophils Relative: 6 % — ABNORMAL HIGH (ref 0–5)
Lymphocytes Relative: 35 % (ref 12–46)
Lymphs Abs: 2.6 10*3/uL (ref 0.7–4.0)
Monocytes Absolute: 0.7 10*3/uL (ref 0.1–1.0)
Monocytes Relative: 9 % (ref 3–12)
Neutro Abs: 3.7 10*3/uL (ref 1.7–7.7)
Neutrophils Relative %: 50 % (ref 43–77)

## 2010-11-22 LAB — CBC
HCT: 37.7 % (ref 36.0–46.0)
Hemoglobin: 12.5 g/dL (ref 12.0–15.0)
MCH: 29.7 pg (ref 26.0–34.0)
MCHC: 33.2 g/dL (ref 30.0–36.0)
MCV: 89.5 fL (ref 78.0–100.0)
Platelets: 269 10*3/uL (ref 150–400)
RBC: 4.21 MIL/uL (ref 3.87–5.11)
RDW: 15 % (ref 11.5–15.5)
WBC: 7.3 10*3/uL (ref 4.0–10.5)

## 2010-11-22 LAB — D-DIMER, QUANTITATIVE (NOT AT ARMC): D-Dimer, Quant: <0.22 ug/mL-FEU (ref 0.00–0.48)

## 2010-11-22 LAB — FIBRINOGEN: Fibrinogen: 635 mg/dL — ABNORMAL HIGH (ref 204–475)

## 2010-11-23 DIAGNOSIS — I80299 Phlebitis and thrombophlebitis of other deep vessels of unspecified lower extremity: Secondary | ICD-10-CM

## 2010-11-23 LAB — CARDIOLIPIN ANTIBODIES, IGG, IGM, IGA
Anticardiolipin IgA: 6 APL U/mL — ABNORMAL LOW (ref <22)
Anticardiolipin IgG: 11 GPL U/mL — ABNORMAL LOW (ref <23)
Anticardiolipin IgM: 3 MPL U/mL — ABNORMAL LOW (ref <11)

## 2010-11-23 LAB — CBC
HCT: 35.6 % — ABNORMAL LOW (ref 36.0–46.0)
Hemoglobin: 12 g/dL (ref 12.0–15.0)
MCH: 29.9 pg (ref 26.0–34.0)
MCHC: 33.7 g/dL (ref 30.0–36.0)
MCV: 88.6 fL (ref 78.0–100.0)
Platelets: 232 10*3/uL (ref 150–400)
RBC: 4.02 MIL/uL (ref 3.87–5.11)
RDW: 14.9 % (ref 11.5–15.5)
WBC: 7.4 10*3/uL (ref 4.0–10.5)

## 2010-11-23 LAB — SAVE SMEAR

## 2010-11-23 LAB — BETA-2-GLYCOPROTEIN I ABS, IGG/M/A
Beta-2 Glyco I IgG: 0 G Units (ref <20)
Beta-2-Glycoprotein I IgA: 3 A Units (ref <20)
Beta-2-Glycoprotein I IgM: 4 M Units (ref <20)

## 2010-11-23 LAB — GLUCOSE, CAPILLARY: Glucose-Capillary: 112 mg/dL — ABNORMAL HIGH (ref 70–99)

## 2010-11-23 NOTE — Consult Note (Signed)
[name], [name] NO.:  [id number]  MEDICAL RECORD NO.:  [id number]  LOCATION:  3705                         FACILITY:  [facility]  PHYSICIAN:  Levert Feinstein, M.D., F.A.C.P.DATE OF BIRTH: Jul 24, 1940  DATE OF CONSULTATION:  11/22/2010 DATE OF DISCHARGE:                                CONSULTATION   This is a Hematology consultation to evaluate this lady who appears to be a true Coumadin failure.  Mrs. Derocher is a pleasant 70 year old woman who has been in overall good health.  She had a number of evaluations for chronic anemia.  She was eventually found to have a GI stromal tumor of the duodenum and underwent surgical resection on June 27, 2010,  by Dr. Donell Beers.  She had a small 3-cm tumor limited to the duodenum.  Negative surgical margins of resection.  No tumor in 4 peripancreatic lymph nodes.  Low grade.  No vascular or lymphatic invasion.  Incidental cholecystectomy showed mild chronic cholecystitis.  Of note, upper and lower endoscopy done prior to the surgery showed no additional pathology.  She had an uneventful recuperation from the surgery.  She went to [place], [place] for her mother's 90th birthday in mid February.  While she was there in early March, she developed acute onset of pain in her right calf and was diagnosed with a deep venous thrombosis.  She was hospitalized for 9 days.  She was anticoagulated.  She received a 9-day course of antibiotics.  Her regular Coumadin dose has been 5 mg daily except for 7.5 mg twice a week until 3 weeks ago when INR was 4.5.  A minor dose adjustment was made and the 7.5 mg dose that she took twice a week was decreased to once a week with 5 mg on other days of the week.  For the last 4 weeks she has noted the intermittent occurrence of a stabbing chest pain in her epigastric region that came on in waves and radiated to her back.  This was happening once or twice a day.  No associated cough or  hemoptysis.  When the pain got intense it was a associated with dyspnea.  She went to the beach with her husband this weekend.  On Saturday, the pain intensified and lasted for up to an hour.  She called her surgeon when she returned home and was evaluated on Monday.  A CT scan of the chest done yesterday shows acute pulmonary emboli in the left lung.  Scan not available for my review at time of this dictation.  Dr. Donell Beers communicated with Dr. Earl Gala.  She was started on low- molecular weight heparin last night.  He got lab in his office this morning which showed a pro time of 63.1 seconds with INR of 5.3.  She was admitted for further evaluation.  She has no other major medical illness and has had no other major surgery.  She gives a history of diphtheria requiring hospitalization when she was 70 years old.  She has no personal cardiopulmonary disease history.  She never had any prior blood clots until March of this year. There is a strong family history of early cardiac disease and blood clots.  Her father had  his first MI when he was 47 and had multiple subsequent MRIs and succumbed to an MI at age 24.  She lost her brother at aged 59 with an acute MI.  She has one daughter aged 20 who had a saddle pulmonary embolus about 1 week following a hysterectomy 4 years ago.  Her mother is still alive at age 57, one brother and 4 sisters have not had problems with blood clots.  She is not obese.  She is a nonsmoker.  She is up-to-date on other health maintenance exams and had normal mammograms on October 30, 2010.  PAST MEDICAL HISTORY:  Negative for hypertension, MI, ulcers, diabetes, hepatitis, yellow jaundice, thyroid trouble, seizure, stroke.  MEDS AT TIME OF ADMISSION:  Included 1. Coumadin dose noted above. 2. Metoprolol XL 50 mg one half to one tablet b.i.d. 3. Simvastatin 20 mg daily. 4. Ferrous sulfate 325 mg b.i.d. 5. Multivitamins one daily. 6. Acyclovir 400 mg daily. 7.  Oxycodone 5 mg/5 mL 10 mL q.4 h. p.r.n. 8. Protonix 40 mg daily. 9. Aspirin 81 mg daily. 10.Quinine p.r.n. leg cramps. 11.Vitamin B complex and vitamin B12 and vitamin D orally daily.  ALLERGIES:  CLARITHROMYCIN with "swelling" and GI intolerance to NONSTEROIDALS.  SOCIAL HISTORY:  She works for [employer]. Nonsmoker.  Very rare alcoholic beverage.  Married.  One daughter who had a saddle embolus 1 week following GYN surgery.  REVIEW OF SYSTEMS:  She has lost about 30 pounds since the GI surgery in January she now has early satiety and has lost her taste for sweets. She otherwise states that she feels very well.  PHYSICAL EXAMINATION:  GENERAL:  Shows a well-nourished Caucasian woman in no distress. VITAL SIGNS:  Blood pressure 108/62, pulse 68 regular, respirations 20, temperature 97.4, oxygen saturation 98% on room air. Skin, hair and nails normal.  No ecchymosis, petechiae or rash. HEENT:  Pupils equal, reactive to light.  Pharynx, no erythema or exudate. NECK:  Supple.  No thyromegaly.  Carotids 2+, no bruits. LUNGS:  Minimal rales right base.  Resonant to percussion throughout. HEART:  Regular cardiac rhythm.  No murmur, gallop or rub. BREASTS:  Not examined. ABDOMEN:  Soft, nontender.  No masses or organomegaly. EXTREMITIES:  No edema.  No calf tenderness.  Dorsalis pedis pulse 1+ right foot, not palpable on the left foot.  PT pulses not palpable. Radial pulses are 2+ symmetric.  Ulnar pulse 1+ on the right, not palpable on the left.  No cyanosis. NEUROLOGIC:  With no focal deficit.  LAB STUDIES:  Pending.  IMPRESSION:  Apparent to Coumadin failure in a lady who is 6 months post resection of an early stage GI stromal tumor of the duodenum who has subsequently sustained a left lower extremity DVT about 2 months postop as an outpatient.  She has a strong family history of early coronary artery disease and her only daughter had a pulmonary embolus 1 week after a GYN surgery  procedure.  I did not mention in my review of systems but she denies any chronic polyarthralgias or polymyalgias.  No signs or symptoms of collagen vascular disorder.  RECOMMENDATIONS:  At this point, I would keep her on low-molecular weight heparin for a minimum of 3 months.  When she is off all Coumadin for over 4 weeks, I would check protein S and protein C levels.  When she is off Lovenox, I would check an antithrombin level and a lupus anticoagulant profile.  Both of these tests are inaccurate when someone  is on a heparin product.  I will go ahead and check a factor V Leiden and prothrombin gene mutation studies as well as anticardiolipin antibodies and anti-beta2-glycoprotein I antibodies.  I would also consider checking a plasma homocysteine level given the family history of early significant cardiac disease.  If her evaluation reveals any significant elevations of antiphospholipid antibodies, we still might be able to use Coumadin in the future with alternative laboratory testing (chromogenic, factor Xa levels). Antiphospholipid antibodies are notorious for interfering with the prothrombin time test.  My clinical impression, however, is that we are not dealing with an antiphospholipid antibody syndrome.  If she tests negative for significant elevations in antiphospholipid antibodies, I would strongly consider putting her on the direct thrombin inhibitor dabigatran (Pradaxa) 150 mg p.o. b.i.d. after she completes minimum of 3 months of Lovenox.  I would consider referral to [facility] for some more sophisticated laboratory test if the above testing is unrevealing.  Thank you for this consultation.  I will follow with you following the patient's discharge.  ADDENDUM:  Additional history and Dr. Newell Coral office note that I did not elicit from the patient:  she has coronary artery disease with heart catheterization in 2008 showing 50% ostial LAD lesion and an 80%  mid LAD lesion.  H. pylori related positive gastric ulcer in 2001.  Peripheral vascular disease.  GERD.  Hyperlipidemia.  Benign colon polyps.     Levert Feinstein, M.D., F.A.C.P.     JMG/MEDQ  D:  11/22/2010  T:  11/23/2010  Job:  [id number]  cc:   Theressa Millard, M.D. Almond Lint, MD  Electronically Signed by Cephas Darby M.D. on 11/23/2010 12:17:50 PM

## 2010-11-24 LAB — FACTOR 5 LEIDEN

## 2010-11-24 NOTE — Discharge Summary (Signed)
[name], [name] NO.:  [id number]  MEDICAL RECORD NO.:  [id number]  LOCATION:  3705                         FACILITY:  [facility]  PHYSICIAN:  Theressa Millard, M.D.    DATE OF BIRTH:  11-Jun-1940  DATE OF ADMISSION:  11/22/2010 DATE OF DISCHARGE:  11/23/2010                              DISCHARGE SUMMARY   ADMITTING DIAGNOSIS:  Pulmonary embolism while on Coumadin.  DISCHARGE DIAGNOSES: 1. Pulmonary embolism on Coumadin, true Coumadin failure. 2. Recent Whipple procedure for gastrointestinal stromal tumor. 3. Coronary disease with 50% ostial left anterior descending lesion,     80% mid left anterior descending lesion in 2008. 4. Midepigastric discomfort, possibly due to gastroparesis. 5. Gastroesophageal reflux disease on proton pump inhibitor.  The patient is a 70 year old white female who had a pancreaticoduodenectomy in January for pancreatic mass.  This turned out to be gastrointestinal stromal tumor.  Ended up with a pulmonary embolism after traveling to [place] in February.  Returned and was seen by Dr. Donell Beers in May.  At that time, had some edema in her lower leg and Doppler showed a clot.  She was adequately anticoagulated, so I presumed that this clot was old.  However, the patient had an episode of left chest pain with associated diaphoresis and some shortness of breath. She saw Dr. Donell Beers who obtained a CT scan that showed evidence of a fresh pulmonary embolism.  The patient was brought in for observation to get appropriate consultation in a rapid manner and to figure out how best to treat her.  HOSPITAL COURSE:  The patient was admitted, was seen in consultation by Dr. Darrick Penna of Vascular Surgery and Dr. Cyndie Chime of Hematology.  Dr. Cyndie Chime felt the patient should be placed on Lovenox for the next 3 months with further testing along the way to decide on optimal anticoagulation strategy.  Dr. Darrick Penna was ready to IVC filter if necessary, but  because of prior problems with filters in the past and literature that did not suggest great success in the long-term with filters, it was thought that the patient should simply be placed on Lovenox.  Therefore the patient is being discharged on Lovenox 72 mg twice daily for minimum of 3 months.  Once off Coumadin for 4 weeks, Dr. Cyndie Chime plans on checking protein S and C.  Once off Lovenox, he will check an antithrombin III and a lupus anticoagulant.  In the meantime pending at this time there is a factor V Leiden, prothrombin gene mutation, ACLA and anti-beta 2 GP1 antibodies as well as a homocysteine level.  DISCHARGE MEDICATIONS: 1. Metoclopramide 10 mg a.c. 2. Enoxaparin 72 mg twice daily. 3. Iron sulfate 325 mg twice daily. 4. Metoprolol tartrate 50 mg 1 tablet in the morning, 1/2 tablet on     the evening. 5. Pantoprazole 40 mg daily. 6. Simvastatin 20 mg daily. 7. The patient is to stop Coumadin.  FOLLOWUP:  Dr. Patsy Lager office will call her to see her in about a month.  Dr. Donell Beers would like to see her in 2-3 weeks and I will see the patient back in about 3 months.  ACTIVITY:  As tolerated.  DIET:  No added salt.  Theressa Millard, M.D.     JO/MEDQ  D:  11/23/2010  T:  11/23/2010  Job:  [id number]  cc:   Levert Feinstein, M.D., F.A.C.P. Almond Lint, MD Janetta Hora Darrick Penna, MD  Electronically Signed by Theressa Millard M.D. on 11/24/2010 11:33:59 AM

## 2010-11-27 LAB — PROTHROMBIN GENE MUTATION

## 2010-11-28 ENCOUNTER — Encounter (INDEPENDENT_AMBULATORY_CARE_PROVIDER_SITE_OTHER): Payer: Self-pay | Admitting: General Surgery

## 2010-12-12 ENCOUNTER — Encounter: Payer: BC Managed Care – PPO | Admitting: Oncology

## 2010-12-12 ENCOUNTER — Encounter (HOSPITAL_BASED_OUTPATIENT_CLINIC_OR_DEPARTMENT_OTHER): Payer: BC Managed Care – PPO | Admitting: Oncology

## 2010-12-12 DIAGNOSIS — I2699 Other pulmonary embolism without acute cor pulmonale: Secondary | ICD-10-CM

## 2010-12-12 DIAGNOSIS — I2510 Atherosclerotic heart disease of native coronary artery without angina pectoris: Secondary | ICD-10-CM

## 2010-12-12 DIAGNOSIS — I824Z9 Acute embolism and thrombosis of unspecified deep veins of unspecified distal lower extremity: Secondary | ICD-10-CM

## 2010-12-12 DIAGNOSIS — C170 Malignant neoplasm of duodenum: Secondary | ICD-10-CM

## 2010-12-13 ENCOUNTER — Encounter (HOSPITAL_BASED_OUTPATIENT_CLINIC_OR_DEPARTMENT_OTHER): Payer: BC Managed Care – PPO | Admitting: Oncology

## 2010-12-13 ENCOUNTER — Other Ambulatory Visit: Payer: Self-pay | Admitting: Oncology

## 2010-12-13 DIAGNOSIS — I824Z9 Acute embolism and thrombosis of unspecified deep veins of unspecified distal lower extremity: Secondary | ICD-10-CM

## 2010-12-13 LAB — CBC & DIFF AND RETIC
BASO%: 0.3 % (ref 0.0–2.0)
Basophils Absolute: 0 10*3/uL (ref 0.0–0.1)
EOS%: 6.5 % (ref 0.0–7.0)
Eosinophils Absolute: 0.4 10*3/uL (ref 0.0–0.5)
HCT: 39.7 % (ref 34.8–46.6)
HGB: 13 g/dL (ref 11.6–15.9)
Immature Retic Fract: 5.6 % (ref 0.00–10.70)
LYMPH%: 34.5 % (ref 14.0–49.7)
MCH: 29.7 pg (ref 25.1–34.0)
MCHC: 32.7 g/dL (ref 31.5–36.0)
MCV: 90.6 fL (ref 79.5–101.0)
MONO#: 0.5 10*3/uL (ref 0.1–0.9)
MONO%: 8.5 % (ref 0.0–14.0)
NEUT#: 2.9 10*3/uL (ref 1.5–6.5)
NEUT%: 50.2 % (ref 38.4–76.8)
Platelets: 239 10*3/uL (ref 145–400)
RBC: 4.38 10*6/uL (ref 3.70–5.45)
RDW: 14.6 % — ABNORMAL HIGH (ref 11.2–14.5)
Retic %: 2.28 % — ABNORMAL HIGH (ref 0.50–1.50)
Retic Ct Abs: 99.86 10*3/uL — ABNORMAL HIGH (ref 18.30–72.70)
WBC: 5.9 10*3/uL (ref 3.9–10.3)
lymph#: 2 10*3/uL (ref 0.9–3.3)

## 2010-12-13 LAB — MORPHOLOGY: PLT EST: ADEQUATE

## 2010-12-13 LAB — CHCC SMEAR

## 2010-12-18 LAB — PROTEIN S, ANTIGEN, FREE: Protein S Ag, Free: 102 % normal (ref 50–147)

## 2010-12-18 LAB — PROTEIN S ACTIVITY: Protein S Activity: 110 % (ref 69–129)

## 2010-12-18 LAB — PROTEIN C ACTIVITY: Protein C Activity: 167 % — ABNORMAL HIGH (ref 75–133)

## 2011-01-19 ENCOUNTER — Encounter (INDEPENDENT_AMBULATORY_CARE_PROVIDER_SITE_OTHER): Payer: Self-pay | Admitting: General Surgery

## 2011-01-19 ENCOUNTER — Ambulatory Visit (INDEPENDENT_AMBULATORY_CARE_PROVIDER_SITE_OTHER): Payer: BC Managed Care – PPO | Admitting: General Surgery

## 2011-01-19 VITALS — BP 122/74 | HR 60

## 2011-01-19 DIAGNOSIS — C49A0 Gastrointestinal stromal tumor, unspecified site: Secondary | ICD-10-CM

## 2011-01-19 DIAGNOSIS — C494 Malignant neoplasm of connective and soft tissue of abdomen: Secondary | ICD-10-CM

## 2011-01-19 NOTE — Assessment & Plan Note (Signed)
Pains mostly resolved.   Follow up in January with CT scan abd/pelvis.   Dr. Earl Gala managing anticoagulation.

## 2011-01-19 NOTE — Progress Notes (Signed)
Subjective:     Patient ID: [name], female   DOB: 01-Jan-1941, 70 y.o.   MRN: [id number]  HPI Pt is s/p whipple Jun 26, 2010.  She had a low grade leak, than went to [place] and got a DVT and PE.  She has has issues with abd pain and chest pain.  These are now infrequent, and resolve if she sits still and takes slow deep breaths.  She is not having nausea or vomiting.  She denies weight loss.  She is not on pancreatic enzymes.    Review of Systems Otherwise negative.      Objective:   Physical Exam  Constitutional: She is oriented to person, place, and time. She appears well-developed and well-nourished. No distress.  HENT:  Head: Normocephalic and atraumatic.  Eyes: Pupils are equal, round, and reactive to light. No scleral icterus.  Neck: Normal range of motion. Neck supple.  Cardiovascular: Normal rate.   Pulmonary/Chest: No respiratory distress. She exhibits no tenderness.  Abdominal: Soft. She exhibits no distension and no mass. There is no tenderness. There is no rebound and no guarding.  Musculoskeletal: Normal range of motion. She exhibits no edema.  Neurological: She is alert and oriented to person, place, and time.  Skin: Skin is warm and dry. No rash noted. She is not diaphoretic. No erythema. No pallor.  Psychiatric: She has a normal mood and affect. Her behavior is normal. Judgment and thought content normal.        Assessment/Plan:     GIST, malignant T2N0 Pains mostly resolved.   Follow up in January with CT scan abd/pelvis.   Dr. Earl Gala managing anticoagulation.

## 2011-03-05 LAB — LIPID PANEL
Cholesterol: 193
Cholesterol: 226 — ABNORMAL HIGH
HDL: 59
HDL: 69
LDL Cholesterol: 109 — ABNORMAL HIGH
LDL Cholesterol: 117 — ABNORMAL HIGH
Total CHOL/HDL Ratio: 3.3
Total CHOL/HDL Ratio: 3.3
Triglycerides: 126
Triglycerides: 201 — ABNORMAL HIGH
VLDL: 25
VLDL: 40

## 2011-03-05 LAB — DIFFERENTIAL
Basophils Absolute: 0
Basophils Absolute: 0
Basophils Relative: 1
Basophils Relative: 1
Eosinophils Absolute: 0.2
Eosinophils Absolute: 0.2
Eosinophils Relative: 3
Eosinophils Relative: 4
Lymphocytes Relative: 33
Lymphocytes Relative: 42
Lymphs Abs: 2.2
Lymphs Abs: 2.3
Monocytes Absolute: 0.5
Monocytes Absolute: 0.5
Monocytes Relative: 8
Monocytes Relative: 9
Neutro Abs: 2.5
Neutro Abs: 3.7
Neutrophils Relative %: 45
Neutrophils Relative %: 56

## 2011-03-05 LAB — CARDIAC PANEL(CRET KIN+CKTOT+MB+TROPI)
CK, MB: 0.7
CK, MB: 0.8
Relative Index: INVALID
Relative Index: INVALID
Total CK: 45
Total CK: 45
Troponin I: 0.01
Troponin I: 0.02

## 2011-03-05 LAB — CBC
HCT: 30 — ABNORMAL LOW
HCT: 31.6 — ABNORMAL LOW
HCT: 33.6 — ABNORMAL LOW
HCT: 33.9 — ABNORMAL LOW
Hemoglobin: 10.1 — ABNORMAL LOW
Hemoglobin: 10.6 — ABNORMAL LOW
Hemoglobin: 11.3 — ABNORMAL LOW
Hemoglobin: 11.3 — ABNORMAL LOW
MCHC: 33.3
MCHC: 33.6
MCHC: 33.7
MCHC: 33.7
MCV: 84.9
MCV: 85.2
MCV: 85.9
MCV: 86.3
Platelets: 289
Platelets: 302
Platelets: 357
Platelets: 360
RBC: 3.48 — ABNORMAL LOW
RBC: 3.72 — ABNORMAL LOW
RBC: 3.95
RBC: 3.95
RDW: 16 — ABNORMAL HIGH
RDW: 16.2 — ABNORMAL HIGH
RDW: 16.2 — ABNORMAL HIGH
RDW: 16.4 — ABNORMAL HIGH
WBC: 5.5
WBC: 6
WBC: 6.7
WBC: 6.9

## 2011-03-05 LAB — BASIC METABOLIC PANEL
BUN: 16
BUN: 17
CO2: 25
CO2: 25
Calcium: 8.8
Calcium: 8.8
Chloride: 108
Chloride: 108
Creatinine, Ser: 0.87
Creatinine, Ser: 0.92
GFR calc Af Amer: >60
GFR calc Af Amer: >60
GFR calc non Af Amer: >60
GFR calc non Af Amer: >60
Glucose, Bld: 92
Glucose, Bld: 93
Potassium: 3.8
Potassium: 4.1
Sodium: 140
Sodium: 141

## 2011-03-05 LAB — COMPREHENSIVE METABOLIC PANEL
ALT: 20
AST: 25
Albumin: 3.9
Alkaline Phosphatase: 76
BUN: 14
CO2: 24
Calcium: 9.6
Chloride: 106
Creatinine, Ser: 0.72
GFR calc Af Amer: >60
GFR calc non Af Amer: >60
Glucose, Bld: 92
Potassium: 4
Sodium: 138
Total Bilirubin: 0.8
Total Protein: 6.9

## 2011-03-05 LAB — I-STAT 8, (EC8 V) (CONVERTED LAB)
Acid-base deficit: 1
BUN: 15
Bicarbonate: 25.6 — ABNORMAL HIGH
Chloride: 108
Glucose, Bld: 87
HCT: 36
Hemoglobin: 12.2
Operator id: 146091
Potassium: 4.4
Sodium: 140
TCO2: 27
pCO2, Ven: 48.4
pH, Ven: 7.332 — ABNORMAL HIGH

## 2011-03-05 LAB — APTT: aPTT: 29

## 2011-03-05 LAB — POCT I-STAT CREATININE
Creatinine, Ser: 0.8
Operator id: 146091

## 2011-03-05 LAB — CK TOTAL AND CKMB (NOT AT ARMC)
CK, MB: 1.2
Relative Index: INVALID
Total CK: 67

## 2011-03-05 LAB — POCT CARDIAC MARKERS
CKMB, poc: <1 — ABNORMAL LOW
Myoglobin, poc: 49.1
Operator id: 146091
Troponin i, poc: <0.05

## 2011-03-05 LAB — PROTIME-INR
INR: 0.9
Prothrombin Time: 12.2

## 2011-03-05 LAB — TROPONIN I: Troponin I: 0.02

## 2011-04-03 LAB — PROTEIN C ACTIVITY: Protein C Activity: 167 % — ABNORMAL HIGH (ref 75–133)

## 2011-04-03 LAB — PROTEIN C, TOTAL
Protein C, Total: 118 % (ref 72–160)
Protein C, Total: 118 % (ref 72–160)

## 2011-04-03 LAB — PROTEIN S, TOTAL
Protein S Total: 153 % — ABNORMAL HIGH (ref 60–150)
Protein S Total: 153 % — ABNORMAL HIGH (ref 60–150)

## 2011-04-03 LAB — HOMOCYSTEINE
Homocysteine: 7.7 umol/L (ref 4.0–15.4)
Homocysteine: 7.7 umol/L (ref 4.0–15.4)

## 2011-04-03 LAB — PROTEIN S ACTIVITY: Protein S Activity: 110 % (ref 69–129)

## 2011-06-04 ENCOUNTER — Encounter (INDEPENDENT_AMBULATORY_CARE_PROVIDER_SITE_OTHER): Payer: Self-pay | Admitting: General Surgery

## 2011-06-04 ENCOUNTER — Ambulatory Visit (INDEPENDENT_AMBULATORY_CARE_PROVIDER_SITE_OTHER): Payer: BC Managed Care – PPO | Admitting: General Surgery

## 2011-06-04 DIAGNOSIS — C494 Malignant neoplasm of connective and soft tissue of abdomen: Secondary | ICD-10-CM

## 2011-06-04 DIAGNOSIS — K8689 Other specified diseases of pancreas: Secondary | ICD-10-CM

## 2011-06-04 DIAGNOSIS — C49A0 Gastrointestinal stromal tumor, unspecified site: Secondary | ICD-10-CM

## 2011-06-04 DIAGNOSIS — K8681 Exocrine pancreatic insufficiency: Secondary | ICD-10-CM

## 2011-06-04 MED ORDER — PANCRELIPASE (LIP-PROT-AMYL) 24000-76000 UNITS PO CPEP: ORAL_CAPSULE | ORAL | Status: DC

## 2011-06-04 NOTE — Assessment & Plan Note (Signed)
Doing well after whipple except for some possible pancreatic exocrine insufficiency.   Gaining weight back.  Has stopped Coumadin for perioperative/peritravel DVT.    Will try some panc enzymes for diarrhea and abdominal pain.

## 2011-06-04 NOTE — Assessment & Plan Note (Signed)
Try creon with meals.

## 2011-06-04 NOTE — Progress Notes (Addendum)
Subjective:     Patient ID: [name], female   DOB: 01/13/1941, 71 y.o.   MRN: [id number]  HPI Pt is s/p whipple Jun 26, 2010.  She had a low grade leak, than went to [place] and got a DVT and PE.  She has been doing much better.  She took her 6 months of coumadin, now is off.  She has gained back some of the weight she lost after the whipple.  She is not having fevers or chills.  Her energy level has improved.  She is having some abdominal pain, gas, and loose stools with fatty foods.  This is new since her last visit.    Review of Systems Otherwise negative.      Objective:   Physical Exam  Constitutional: She is oriented to person, place, and time. She appears well-developed and well-nourished. No distress.  HENT:  Head: Normocephalic and atraumatic.  Eyes: Pupils are equal, round, and reactive to light. No scleral icterus.  Neck: Normal range of motion. Neck supple.  Cardiovascular: Normal rate.   Pulmonary/Chest: No respiratory distress.  Abdominal: Soft. She exhibits no distension and no mass. There is no tenderness. There is no rebound and no guarding.  Musculoskeletal: Normal range of motion. She exhibits no edema.  Neurological: She is alert and oriented to person, place, and time.  Skin: Skin is warm and dry. No rash noted. She is not diaphoretic. No erythema. No pallor.  Psychiatric: She has a normal mood and affect. Her behavior is normal. Judgment and thought content normal.        Assessment/Plan:     GIST, malignant T2N0 Doing well after whipple except for some possible pancreatic exocrine insufficiency.   Gaining weight back.  Has stopped Coumadin for perioperative/peritravel DVT.    Will try some panc enzymes for diarrhea and abdominal pain.  Exocrine pancreatic insufficiency Try creon with meals.

## 2011-06-05 ENCOUNTER — Other Ambulatory Visit (INDEPENDENT_AMBULATORY_CARE_PROVIDER_SITE_OTHER): Payer: Self-pay | Admitting: General Surgery

## 2011-06-05 DIAGNOSIS — C49A0 Gastrointestinal stromal tumor, unspecified site: Secondary | ICD-10-CM

## 2011-08-08 ENCOUNTER — Ambulatory Visit
Admission: RE | Admit: 2011-08-08 | Discharge: 2011-08-08 | Disposition: A | Discharge status: Home / Self Care | Payer: BC Managed Care – PPO | Source: Ambulatory Visit | Attending: General Surgery | Admitting: General Surgery

## 2011-08-08 DIAGNOSIS — C49A0 Gastrointestinal stromal tumor, unspecified site: Secondary | ICD-10-CM

## 2011-08-08 MED ORDER — IOHEXOL 300 MG/ML  SOLN
100.0000 mL | Freq: Once | INTRAMUSCULAR | Status: AC | PRN
  Administered 2011-08-08: 100 mL via INTRAVENOUS

## 2011-08-08 NOTE — Progress Notes (Signed)
Quick Note:  Can you let ms Taddeo know that scan is negative for cancer? It does show a fair amount of stool in colon and she may benefit from increasing her bowel regimen. TX FB ______

## 2011-08-09 ENCOUNTER — Telehealth (INDEPENDENT_AMBULATORY_CARE_PROVIDER_SITE_OTHER): Payer: Self-pay

## 2011-08-09 NOTE — Telephone Encounter (Signed)
Spoke with [name] and gave her results of scan.  She is still concerned about the pain and pressure in the sternum area, and would like to speak with Dr. Donell Beers about the possibility that she may have a hiatal hernia. I told her we would call her back early next week.

## 2011-08-13 ENCOUNTER — Telehealth (INDEPENDENT_AMBULATORY_CARE_PROVIDER_SITE_OTHER): Payer: Self-pay

## 2011-08-13 NOTE — Telephone Encounter (Signed)
Called the pt to check on her today.  She says she has changed her diet to exclude all fried and fatty foods, and is eating fruits, vegetables, and lean meats broiled or baked.  She says that she has already noticed a difference in regards to the pressure/burning in her chest area.  She says if she had any more problems she will call us back for a GI referral to Dr. Dorena Cookey, who she has seen in the past.

## 2011-10-24 ENCOUNTER — Other Ambulatory Visit: Payer: Self-pay | Admitting: Dermatology

## 2011-11-12 ENCOUNTER — Other Ambulatory Visit: Payer: Self-pay | Admitting: Obstetrics and Gynecology

## 2011-11-12 ENCOUNTER — Other Ambulatory Visit (HOSPITAL_COMMUNITY)
Admission: RE | Admit: 2011-11-12 | Discharge: 2011-11-12 | Disposition: A | Discharge status: Home / Self Care | Payer: BC Managed Care – PPO | Source: Ambulatory Visit | Attending: Obstetrics and Gynecology | Admitting: Obstetrics and Gynecology

## 2011-11-12 DIAGNOSIS — Z01419 Encounter for gynecological examination (general) (routine) without abnormal findings: Secondary | ICD-10-CM | POA: Insufficient documentation

## 2011-11-12 DIAGNOSIS — Z1159 Encounter for screening for other viral diseases: Secondary | ICD-10-CM | POA: Insufficient documentation

## 2011-12-05 ENCOUNTER — Ambulatory Visit (INDEPENDENT_AMBULATORY_CARE_PROVIDER_SITE_OTHER): Payer: BC Managed Care – PPO | Admitting: General Surgery

## 2011-12-17 ENCOUNTER — Encounter (INDEPENDENT_AMBULATORY_CARE_PROVIDER_SITE_OTHER): Payer: Self-pay | Admitting: General Surgery

## 2011-12-17 ENCOUNTER — Ambulatory Visit (INDEPENDENT_AMBULATORY_CARE_PROVIDER_SITE_OTHER): Payer: BC Managed Care – PPO | Admitting: General Surgery

## 2011-12-17 VITALS — BP 124/66 | HR 60 | Temp 96.6°F | Ht 65.0 in | Wt 173.4 lb

## 2011-12-17 DIAGNOSIS — K8681 Exocrine pancreatic insufficiency: Secondary | ICD-10-CM

## 2011-12-17 DIAGNOSIS — K8689 Other specified diseases of pancreas: Secondary | ICD-10-CM

## 2011-12-17 DIAGNOSIS — R1013 Epigastric pain: Secondary | ICD-10-CM

## 2011-12-17 DIAGNOSIS — C494 Malignant neoplasm of connective and soft tissue of abdomen: Secondary | ICD-10-CM

## 2011-12-17 DIAGNOSIS — C49A0 Gastrointestinal stromal tumor, unspecified site: Secondary | ICD-10-CM

## 2011-12-17 NOTE — Assessment & Plan Note (Signed)
Getting upper GI to assess for reflux/stricture/ or other issue for dysphagia.

## 2011-12-17 NOTE — Patient Instructions (Signed)
Double creon dose.    Get upper GI

## 2011-12-17 NOTE — Assessment & Plan Note (Signed)
Will get CT next January at 2 year mark.

## 2011-12-17 NOTE — Progress Notes (Signed)
HISTORY: Pt is a 71 year old female 18 months after Whipple for GIST.  She was started on Creon for pancreatic insufficiency.  She is off anticoagulation after her DVT/PE.  She has had some epigastric pain that comes on in severe waves that will usually go away on their own.  She was evaluated by her PCP and this does not appear to be cardiac in origin.  She says they used to be more frequent, but now occur around 2-3 times per month.  She is on a PPI, but this did not seem to alter the sensation of pain.     PERTINENT REVIEW OF SYSTEMS: Left shoulder pain and left hand pain.   EXAM: Head: Normocephalic and atraumatic.  Eyes:  Conjunctivae are normal. Pupils are equal, round, and reactive to light. No scleral icterus.  Neck:  Normal range of motion. Neck supple. No tracheal deviation present. No thyromegaly present.  Resp: No respiratory distress, normal effort.  Breath sounds normal.   CV:  RR&R Abd:  Abdomen is soft, non distended and non tender. No masses are palpable.  There is no rebound and no guarding.  No evidence of hernia. Neurological: Alert and oriented to person, place, and time. Coordination normal.  Skin: Skin is warm and dry. No rash noted. No diaphoretic. No erythema. No pallor.  Psychiatric: Normal mood and affect. Normal behavior. Judgment and thought content normal.      ASSESSMENT AND PLAN:   Exocrine pancreatic insufficiency Increase creon to 4 caps with meals.    Abdominal pain, epigastric Getting upper GI to assess for reflux/stricture/ or other issue for dysphagia.  GIST, malignant T2N0 Will get CT next January at 2 year mark.      Maudry Diego, MD Surgical Oncology, General & Endocrine Surgery [facility]  Darnelle Bos, MD Darnelle Bos,*

## 2011-12-17 NOTE — Assessment & Plan Note (Signed)
Increase creon to 4 caps with meals.

## 2011-12-24 ENCOUNTER — Telehealth (INDEPENDENT_AMBULATORY_CARE_PROVIDER_SITE_OTHER): Payer: Self-pay | Admitting: General Surgery

## 2011-12-24 ENCOUNTER — Telehealth (INDEPENDENT_AMBULATORY_CARE_PROVIDER_SITE_OTHER): Payer: Self-pay

## 2011-12-24 ENCOUNTER — Ambulatory Visit
Admission: RE | Admit: 2011-12-24 | Discharge: 2011-12-24 | Disposition: A | Discharge status: Home / Self Care | Payer: BC Managed Care – PPO | Source: Ambulatory Visit | Attending: General Surgery | Admitting: General Surgery

## 2011-12-24 DIAGNOSIS — R1013 Epigastric pain: Secondary | ICD-10-CM

## 2011-12-24 NOTE — Telephone Encounter (Signed)
Discussed results of upper GI with pt.  Has some faint reflux and small hiatal hernia.  None of these are particularly dramatic reasons for her to have severe pain.  However, since it seemed like the symptoms improved when we placed her on protonix, we will increase protonix to BID. If no improvement, will refer to GI for endoscopy.

## 2011-12-24 NOTE — Telephone Encounter (Signed)
Protonix 40mg  bid w/ 3 refills called to [facility].  Pt is aware.

## 2011-12-24 NOTE — Telephone Encounter (Signed)
Called pt with CT scan results.  Will let her know if any further instructions from Dr. Donell Beers.

## 2012-02-11 ENCOUNTER — Other Ambulatory Visit: Payer: Self-pay | Admitting: Obstetrics and Gynecology

## 2012-02-11 DIAGNOSIS — Z1231 Encounter for screening mammogram for malignant neoplasm of breast: Secondary | ICD-10-CM

## 2012-02-20 ENCOUNTER — Encounter (INDEPENDENT_AMBULATORY_CARE_PROVIDER_SITE_OTHER): Payer: Self-pay | Admitting: General Surgery

## 2012-02-29 ENCOUNTER — Ambulatory Visit
Admission: RE | Admit: 2012-02-29 | Discharge: 2012-02-29 | Disposition: A | Discharge status: Home / Self Care | Payer: BC Managed Care – PPO | Source: Ambulatory Visit | Attending: Obstetrics and Gynecology | Admitting: Obstetrics and Gynecology

## 2012-02-29 DIAGNOSIS — Z1231 Encounter for screening mammogram for malignant neoplasm of breast: Secondary | ICD-10-CM

## 2012-06-20 ENCOUNTER — Other Ambulatory Visit: Payer: Self-pay | Admitting: Physician Assistant

## 2013-01-23 ENCOUNTER — Other Ambulatory Visit (HOSPITAL_COMMUNITY): Payer: Self-pay | Admitting: Obstetrics and Gynecology

## 2013-01-23 ENCOUNTER — Ambulatory Visit (HOSPITAL_COMMUNITY)
Admission: RE | Admit: 2013-01-23 | Discharge: 2013-01-23 | Disposition: A | Discharge status: Home / Self Care | Payer: Medicare PPO | Source: Ambulatory Visit | Attending: Obstetrics and Gynecology | Admitting: Obstetrics and Gynecology

## 2013-01-23 ENCOUNTER — Encounter (HOSPITAL_COMMUNITY): Payer: Self-pay

## 2013-01-23 DIAGNOSIS — M810 Age-related osteoporosis without current pathological fracture: Secondary | ICD-10-CM | POA: Insufficient documentation

## 2013-01-23 MED ORDER — ZOLEDRONIC ACID 5 MG/100ML IV SOLN
5.0000 mg | Freq: Once | INTRAVENOUS | Status: AC
  Administered 2013-01-23: 5 mg via INTRAVENOUS
  Filled 2013-01-23: qty 100

## 2013-01-23 MED ORDER — SODIUM CHLORIDE 0.9 % IV SOLN: Freq: Once | INTRAVENOUS | Status: DC

## 2013-01-23 NOTE — Progress Notes (Signed)
Pt states "Not taking calcium supplement currently per DRs instructions" Calcium level 9.6 on 12/30/12.Pt is taking Vitamin D 1000units three times a day.

## 2013-01-23 NOTE — Progress Notes (Signed)
Pt monitored x79mins post reclast infusion-no reactions noted.

## 2013-03-12 ENCOUNTER — Other Ambulatory Visit: Payer: Self-pay

## 2013-03-12 DIAGNOSIS — Z1231 Encounter for screening mammogram for malignant neoplasm of breast: Secondary | ICD-10-CM

## 2013-04-30 ENCOUNTER — Ambulatory Visit
Admission: RE | Admit: 2013-04-30 | Discharge: 2013-04-30 | Disposition: A | Discharge status: Home / Self Care | Payer: Medicare PPO | Source: Ambulatory Visit

## 2013-04-30 DIAGNOSIS — Z1231 Encounter for screening mammogram for malignant neoplasm of breast: Secondary | ICD-10-CM

## 2013-09-24 ENCOUNTER — Ambulatory Visit (INDEPENDENT_AMBULATORY_CARE_PROVIDER_SITE_OTHER): Payer: Medicare PPO

## 2013-09-24 DIAGNOSIS — R262 Difficulty in walking, not elsewhere classified: Secondary | ICD-10-CM

## 2013-09-24 DIAGNOSIS — M2390 Unspecified internal derangement of unspecified knee: Secondary | ICD-10-CM

## 2013-09-24 DIAGNOSIS — M25569 Pain in unspecified knee: Secondary | ICD-10-CM

## 2013-09-24 DIAGNOSIS — R609 Edema, unspecified: Secondary | ICD-10-CM

## 2013-09-24 DIAGNOSIS — M6281 Muscle weakness (generalized): Secondary | ICD-10-CM

## 2013-09-24 DIAGNOSIS — M25669 Stiffness of unspecified knee, not elsewhere classified: Secondary | ICD-10-CM

## 2013-09-29 ENCOUNTER — Encounter (INDEPENDENT_AMBULATORY_CARE_PROVIDER_SITE_OTHER): Payer: Medicare PPO | Admitting: Physical Therapy

## 2013-09-29 DIAGNOSIS — M25669 Stiffness of unspecified knee, not elsewhere classified: Secondary | ICD-10-CM

## 2013-09-29 DIAGNOSIS — R609 Edema, unspecified: Secondary | ICD-10-CM

## 2013-09-29 DIAGNOSIS — M6281 Muscle weakness (generalized): Secondary | ICD-10-CM

## 2013-09-29 DIAGNOSIS — R262 Difficulty in walking, not elsewhere classified: Secondary | ICD-10-CM

## 2013-09-29 DIAGNOSIS — M25569 Pain in unspecified knee: Secondary | ICD-10-CM

## 2013-10-06 ENCOUNTER — Encounter (INDEPENDENT_AMBULATORY_CARE_PROVIDER_SITE_OTHER): Payer: Medicare PPO | Admitting: Physical Therapy

## 2013-10-06 DIAGNOSIS — M25569 Pain in unspecified knee: Secondary | ICD-10-CM

## 2013-10-06 DIAGNOSIS — R262 Difficulty in walking, not elsewhere classified: Secondary | ICD-10-CM

## 2013-10-06 DIAGNOSIS — M6281 Muscle weakness (generalized): Secondary | ICD-10-CM

## 2013-10-06 DIAGNOSIS — M2390 Unspecified internal derangement of unspecified knee: Secondary | ICD-10-CM

## 2013-10-06 DIAGNOSIS — R609 Edema, unspecified: Secondary | ICD-10-CM

## 2013-10-06 DIAGNOSIS — M25669 Stiffness of unspecified knee, not elsewhere classified: Secondary | ICD-10-CM

## 2013-10-06 DIAGNOSIS — IMO0002 Reserved for concepts with insufficient information to code with codable children: Secondary | ICD-10-CM

## 2013-10-06 DIAGNOSIS — D1620 Benign neoplasm of long bones of unspecified lower limb: Secondary | ICD-10-CM

## 2013-10-08 ENCOUNTER — Encounter: Payer: Medicare PPO | Admitting: Physical Therapy

## 2013-10-13 ENCOUNTER — Encounter: Payer: Medicare PPO | Admitting: Physical Therapy

## 2013-10-15 ENCOUNTER — Encounter: Payer: Medicare PPO | Admitting: Physical Therapy

## 2013-10-20 ENCOUNTER — Encounter: Payer: Medicare PPO | Admitting: Physical Therapy

## 2013-10-22 ENCOUNTER — Encounter: Payer: Medicare PPO | Admitting: Physical Therapy

## 2014-01-07 ENCOUNTER — Other Ambulatory Visit (HOSPITAL_COMMUNITY)
Admission: RE | Admit: 2014-01-07 | Discharge: 2014-01-07 | Disposition: A | Discharge status: Home / Self Care | Payer: Medicare PPO | Source: Ambulatory Visit | Attending: Obstetrics and Gynecology | Admitting: Obstetrics and Gynecology

## 2014-01-07 ENCOUNTER — Other Ambulatory Visit: Payer: Self-pay | Admitting: Obstetrics and Gynecology

## 2014-01-07 DIAGNOSIS — Z124 Encounter for screening for malignant neoplasm of cervix: Secondary | ICD-10-CM | POA: Insufficient documentation

## 2014-01-11 LAB — CYTOLOGY - PAP

## 2014-04-06 ENCOUNTER — Other Ambulatory Visit: Payer: Self-pay

## 2014-04-06 DIAGNOSIS — Z1231 Encounter for screening mammogram for malignant neoplasm of breast: Secondary | ICD-10-CM

## 2014-05-04 ENCOUNTER — Ambulatory Visit
Admission: RE | Admit: 2014-05-04 | Discharge: 2014-05-04 | Disposition: A | Discharge status: Home / Self Care | Payer: Commercial Managed Care - HMO | Source: Ambulatory Visit

## 2014-05-04 DIAGNOSIS — Z1231 Encounter for screening mammogram for malignant neoplasm of breast: Secondary | ICD-10-CM

## 2014-07-08 DIAGNOSIS — K219 Gastro-esophageal reflux disease without esophagitis: Secondary | ICD-10-CM | POA: Diagnosis not present

## 2014-07-08 DIAGNOSIS — I2510 Atherosclerotic heart disease of native coronary artery without angina pectoris: Secondary | ICD-10-CM | POA: Diagnosis not present

## 2014-07-08 DIAGNOSIS — Z86010 Personal history of colonic polyps: Secondary | ICD-10-CM | POA: Diagnosis not present

## 2014-07-08 DIAGNOSIS — Z23 Encounter for immunization: Secondary | ICD-10-CM | POA: Diagnosis not present

## 2014-07-08 DIAGNOSIS — M810 Age-related osteoporosis without current pathological fracture: Secondary | ICD-10-CM | POA: Diagnosis not present

## 2014-07-08 DIAGNOSIS — G4700 Insomnia, unspecified: Secondary | ICD-10-CM | POA: Diagnosis not present

## 2014-07-08 DIAGNOSIS — Z0000 Encounter for general adult medical examination without abnormal findings: Secondary | ICD-10-CM | POA: Diagnosis not present

## 2014-08-13 DIAGNOSIS — Z09 Encounter for follow-up examination after completed treatment for conditions other than malignant neoplasm: Secondary | ICD-10-CM | POA: Diagnosis not present

## 2014-08-13 DIAGNOSIS — Z86010 Personal history of colonic polyps: Secondary | ICD-10-CM | POA: Diagnosis not present

## 2015-01-01 DIAGNOSIS — H6980 Other specified disorders of Eustachian tube, unspecified ear: Secondary | ICD-10-CM | POA: Diagnosis not present

## 2015-01-01 DIAGNOSIS — R42 Dizziness and giddiness: Secondary | ICD-10-CM | POA: Diagnosis not present

## 2015-01-10 DIAGNOSIS — B009 Herpesviral infection, unspecified: Secondary | ICD-10-CM | POA: Diagnosis not present

## 2015-01-10 DIAGNOSIS — Z9189 Other specified personal risk factors, not elsewhere classified: Secondary | ICD-10-CM | POA: Diagnosis not present

## 2015-01-10 DIAGNOSIS — B3789 Other sites of candidiasis: Secondary | ICD-10-CM | POA: Diagnosis not present

## 2015-01-10 DIAGNOSIS — Z01419 Encounter for gynecological examination (general) (routine) without abnormal findings: Secondary | ICD-10-CM | POA: Diagnosis not present

## 2015-06-01 ENCOUNTER — Other Ambulatory Visit: Payer: Self-pay

## 2015-06-01 DIAGNOSIS — Z1231 Encounter for screening mammogram for malignant neoplasm of breast: Secondary | ICD-10-CM

## 2015-06-22 ENCOUNTER — Other Ambulatory Visit: Payer: Self-pay

## 2015-06-22 ENCOUNTER — Ambulatory Visit
Admission: RE | Admit: 2015-06-22 | Discharge: 2015-06-22 | Disposition: A | Discharge status: Home / Self Care | Payer: Commercial Managed Care - HMO | Source: Ambulatory Visit

## 2015-06-22 DIAGNOSIS — Z1231 Encounter for screening mammogram for malignant neoplasm of breast: Secondary | ICD-10-CM

## 2015-07-12 DIAGNOSIS — Z1389 Encounter for screening for other disorder: Secondary | ICD-10-CM | POA: Diagnosis not present

## 2015-07-12 DIAGNOSIS — R739 Hyperglycemia, unspecified: Secondary | ICD-10-CM | POA: Diagnosis not present

## 2015-07-12 DIAGNOSIS — Z0001 Encounter for general adult medical examination with abnormal findings: Secondary | ICD-10-CM | POA: Diagnosis not present

## 2015-07-12 DIAGNOSIS — R7309 Other abnormal glucose: Secondary | ICD-10-CM | POA: Diagnosis not present

## 2015-07-12 DIAGNOSIS — I2510 Atherosclerotic heart disease of native coronary artery without angina pectoris: Secondary | ICD-10-CM | POA: Diagnosis not present

## 2015-07-12 DIAGNOSIS — K219 Gastro-esophageal reflux disease without esophagitis: Secondary | ICD-10-CM | POA: Diagnosis not present

## 2015-07-12 DIAGNOSIS — Z862 Personal history of diseases of the blood and blood-forming organs and certain disorders involving the immune mechanism: Secondary | ICD-10-CM | POA: Diagnosis not present

## 2015-09-13 ENCOUNTER — Other Ambulatory Visit: Payer: Self-pay | Admitting: Internal Medicine

## 2015-09-13 DIAGNOSIS — R10814 Left lower quadrant abdominal tenderness: Secondary | ICD-10-CM

## 2015-09-13 DIAGNOSIS — R10824 Left lower quadrant rebound abdominal tenderness: Secondary | ICD-10-CM | POA: Diagnosis not present

## 2015-09-16 ENCOUNTER — Ambulatory Visit
Admission: RE | Admit: 2015-09-16 | Discharge: 2015-09-16 | Disposition: A | Discharge status: Home / Self Care | Payer: Commercial Managed Care - HMO | Source: Ambulatory Visit | Attending: Internal Medicine | Admitting: Internal Medicine

## 2015-09-16 DIAGNOSIS — R1032 Left lower quadrant pain: Secondary | ICD-10-CM | POA: Diagnosis not present

## 2015-09-16 DIAGNOSIS — R10814 Left lower quadrant abdominal tenderness: Secondary | ICD-10-CM

## 2015-09-16 MED ORDER — IOPAMIDOL (ISOVUE-300) INJECTION 61%
100.0000 mL | Freq: Once | INTRAVENOUS | Status: AC | PRN
  Administered 2015-09-16: 100 mL via INTRAVENOUS

## 2016-01-24 DIAGNOSIS — N952 Postmenopausal atrophic vaginitis: Secondary | ICD-10-CM | POA: Diagnosis not present

## 2016-01-24 DIAGNOSIS — N898 Other specified noninflammatory disorders of vagina: Secondary | ICD-10-CM | POA: Diagnosis not present

## 2016-01-24 DIAGNOSIS — Z01411 Encounter for gynecological examination (general) (routine) with abnormal findings: Secondary | ICD-10-CM | POA: Diagnosis not present

## 2016-01-24 DIAGNOSIS — Z9189 Other specified personal risk factors, not elsewhere classified: Secondary | ICD-10-CM | POA: Diagnosis not present

## 2016-04-09 DIAGNOSIS — Z86718 Personal history of other venous thrombosis and embolism: Secondary | ICD-10-CM | POA: Diagnosis not present

## 2016-04-09 DIAGNOSIS — I2510 Atherosclerotic heart disease of native coronary artery without angina pectoris: Secondary | ICD-10-CM | POA: Diagnosis not present

## 2016-04-09 DIAGNOSIS — Z23 Encounter for immunization: Secondary | ICD-10-CM | POA: Diagnosis not present

## 2016-04-09 DIAGNOSIS — H9203 Otalgia, bilateral: Secondary | ICD-10-CM | POA: Diagnosis not present

## 2016-04-09 DIAGNOSIS — Z862 Personal history of diseases of the blood and blood-forming organs and certain disorders involving the immune mechanism: Secondary | ICD-10-CM | POA: Diagnosis not present

## 2016-04-09 DIAGNOSIS — K219 Gastro-esophageal reflux disease without esophagitis: Secondary | ICD-10-CM | POA: Diagnosis not present

## 2016-04-09 DIAGNOSIS — H60543 Acute eczematoid otitis externa, bilateral: Secondary | ICD-10-CM | POA: Diagnosis not present

## 2016-04-09 DIAGNOSIS — M810 Age-related osteoporosis without current pathological fracture: Secondary | ICD-10-CM | POA: Diagnosis not present

## 2016-05-11 DIAGNOSIS — Z86718 Personal history of other venous thrombosis and embolism: Secondary | ICD-10-CM | POA: Diagnosis not present

## 2016-05-11 DIAGNOSIS — K219 Gastro-esophageal reflux disease without esophagitis: Secondary | ICD-10-CM | POA: Diagnosis not present

## 2016-05-11 DIAGNOSIS — R7302 Impaired glucose tolerance (oral): Secondary | ICD-10-CM | POA: Diagnosis not present

## 2016-05-11 DIAGNOSIS — R0789 Other chest pain: Secondary | ICD-10-CM | POA: Diagnosis not present

## 2016-05-11 DIAGNOSIS — I2510 Atherosclerotic heart disease of native coronary artery without angina pectoris: Secondary | ICD-10-CM | POA: Diagnosis not present

## 2016-05-14 ENCOUNTER — Encounter: Payer: Self-pay | Admitting: Cardiovascular Disease

## 2016-05-14 ENCOUNTER — Ambulatory Visit (INDEPENDENT_AMBULATORY_CARE_PROVIDER_SITE_OTHER): Payer: Commercial Managed Care - HMO | Admitting: Cardiovascular Disease

## 2016-05-14 VITALS — BP 124/81 | HR 68 | Ht 65.0 in | Wt 157.0 lb

## 2016-05-14 DIAGNOSIS — R079 Chest pain, unspecified: Secondary | ICD-10-CM | POA: Diagnosis not present

## 2016-05-14 DIAGNOSIS — E785 Hyperlipidemia, unspecified: Secondary | ICD-10-CM | POA: Insufficient documentation

## 2016-05-14 DIAGNOSIS — R071 Chest pain on breathing: Secondary | ICD-10-CM

## 2016-05-14 DIAGNOSIS — E7800 Pure hypercholesterolemia, unspecified: Secondary | ICD-10-CM | POA: Diagnosis not present

## 2016-05-14 NOTE — Assessment & Plan Note (Addendum)
[name] was referred by her PCP for evaluation of atypical chest pain. She did have a Whipple procedure approximately 5 years ago by Dr. Barry Dienes. She was having epigastric pain but recently she developed some left inframammary pain which is somewhat atypical lasting for hours at time without radiation and not brought on by anything in particular. Her cardiac risk factors include treated hyperlipidemia as well as family history with father and brother both died of coronary artery disease. I'm going to get an exercise Myoview stress test to risk stratify her.

## 2016-05-14 NOTE — Progress Notes (Signed)
05/14/2016 [name]   [birth year]  [id number]  Primary Physician Carol Cha, MD Primary Cardiologist: Carol Harp MD [name]  HPI:  [name] is a 75 year old mild to moderately overweight widowed Caucasian female mother of one daughter, the mother and 3 grandchildren who is retired from working at [employer]. She was referred by her PCP for cardiovascular evaluation because of atypical chest pain. Risk factors include treated hyperlipidemia. She does have a family history of heart disease with a father and brother both of whom died of myocardial infarctions. She has never had a heart attack or stroke. She has had a Whipple procedure 5 years ago by Dr. Barry Foley and was having epigastric pain subsequent to that. He developed left inframammary pain over the last several weeks which is off and on. No other associated symptoms nor is there any radiation.   Current Outpatient Prescriptions  Medication Sig Dispense Refill  . cholecalciferol (VITAMIN D) 1000 UNITS tablet Take 1,000 Units by mouth daily.     . metoCLOPramide (REGLAN) 10 MG tablet Take 10 mg by mouth 2 (two) times daily.     . metoprolol succinate (TOPROL-XL) 100 MG 24 hr tablet Take 100 mg by mouth daily. Take with or immediately following a meal.    . multivitamin-iron-minerals-folic acid (CENTRUM) chewable tablet Chew 1 tablet by mouth daily.      . pantoprazole (PROTONIX) 40 MG tablet Take 40 mg by mouth daily.      . simvastatin (ZOCOR) 20 MG tablet Take 20 mg by mouth 1 dose over 24 hours.       No current facility-administered medications for this visit.     No Known Allergies  Social History   Social History  . Marital status: Married    Spouse name: N/A  . Number of children: N/A  . Years of education: N/A   Occupational History  . Not on file.   Social History Main Topics  . Smoking status: Never Smoker  . Smokeless tobacco: Never Used  .  Alcohol use No  . Drug use: No  . Sexual activity: Not on file   Other Topics Concern  . Not on file   Social History Narrative   ** Merged History Encounter **         Review of Systems: General: negative for chills, fever, night sweats or weight changes.  Cardiovascular: negative for chest pain, dyspnea on exertion, edema, orthopnea, palpitations, paroxysmal nocturnal dyspnea or shortness of breath Dermatological: negative for rash Respiratory: negative for cough or wheezing Urologic: negative for hematuria Abdominal: negative for nausea, vomiting, diarrhea, bright red blood per rectum, melena, or hematemesis Neurologic: negative for visual changes, syncope, or dizziness All other systems reviewed and are otherwise negative except as noted above.    Blood pressure 124/81, pulse 68, height '5\' 5"'$  (1.651 m), weight 157 lb (71.2 kg).  General appearance: alert and no distress Neck: no adenopathy, no carotid bruit, no JVD, supple, symmetrical, trachea midline and thyroid not enlarged, symmetric, no tenderness/mass/nodules Lungs: clear to auscultation bilaterally Heart: regular rate and rhythm, S1, S2 normal, no murmur, click, rub or gallop Extremities: extremities normal, atraumatic, no cyanosis or edema  EKG sinus rhythm at 68 with poorly progression. I personally reviewed this EKG  ASSESSMENT AND PLAN:   Chest pain Carol. Foley was referred by her PCP for evaluation of atypical chest pain. She did have a Whipple procedure approximately 5 years ago by Dr. Barry Foley.  She was having epigastric pain but recently she developed some left inframammary pain which is somewhat atypical lasting for hours at time without radiation and not brought on by anything in particular. Her cardiac risk factors include treated hyperlipidemia as well as family history with father and brother both died of coronary artery disease. I'm going to get an exercise Myoview stress test to risk stratify  her.  Hyperlipidemia History of hyperlipidemia on statin therapy followed by her PCP      Carol Harp MD [facility], [facility] 05/14/2016 9:48 AM

## 2016-05-14 NOTE — Assessment & Plan Note (Signed)
History of hyperlipidemia on statin therapy followed by her PCP. 

## 2016-05-14 NOTE — Patient Instructions (Signed)
Medication Instructions: Your physician recommends that you continue on your current medications as directed. Please refer to the Current Medication list given to you today.   Testing/Procedures: Your physician has requested that you have an exercise stress myoview. For further information please visit [email or web address]. Please follow instruction sheet, as given.   Follow-Up: Your physician wants you to follow-up in: 6 months with Dr. Gwenlyn Found. You will receive a reminder letter in the mail two months in advance. If you don't receive a letter, please call our office to schedule the follow-up appointment.   Any Other Special Instructions will be listed below:   Exercise Stress Electrocardiogram An exercise stress electrocardiogram is a test to check how blood flows to your heart. It is done to find areas of poor blood flow. You will need to walk on a treadmill for this test. The electrocardiogram will record your heartbeat when you are at rest and when you are exercising. What happens before the procedure?  Do not have drinks with caffeine or foods with caffeine for 24 hours before the test, or as told by your doctor. This includes coffee, tea (even decaf tea), sodas, chocolate, and cocoa.  Follow your doctor's instructions about eating and drinking before the test.  Ask your doctor what medicines you should or should not take before the test. Take your medicines with water unless told by your doctor not to.  If you use an inhaler, bring it with you to the test.  Bring a snack to eat after the test.  Do not  smoke for 4 hours before the test.  Do not put lotions, powders, creams, or oils on your chest before the test.  Wear comfortable shoes and clothing. What happens during the procedure?  You will have patches put on your chest. Small areas of your chest may need to be shaved. Wires will be connected to the patches.  Your heart rate will be watched while you are resting and  while you are exercising.  You will walk on the treadmill. The treadmill will slowly get faster to raise your heart rate.  The test will take about 1-2 hours. What happens after the procedure?  Your heart rate and blood pressure will be watched after the test.  You may return to your normal diet, activities, and medicines or as told by your doctor. This information is not intended to replace advice given to you by your health care provider. Make sure you discuss any questions you have with your health care provider. Document Released: 10/31/2007 Document Revised: 01/11/2016 Document Reviewed: 01/19/2013 Elsevier Interactive Patient Education  2017 [employer].    If you need a refill on your cardiac medications before your next appointment, please call your pharmacy.

## 2016-05-15 ENCOUNTER — Telehealth (HOSPITAL_COMMUNITY): Payer: Self-pay

## 2016-05-15 NOTE — Telephone Encounter (Signed)
Encounter complete. 

## 2016-05-16 ENCOUNTER — Ambulatory Visit (HOSPITAL_COMMUNITY)
Admission: RE | Admit: 2016-05-16 | Discharge: 2016-05-16 | Disposition: A | Discharge status: Home / Self Care | Payer: Commercial Managed Care - HMO | Source: Ambulatory Visit | Attending: Cardiovascular Disease | Admitting: Cardiovascular Disease

## 2016-05-16 DIAGNOSIS — R079 Chest pain, unspecified: Secondary | ICD-10-CM | POA: Diagnosis not present

## 2016-05-16 LAB — MYOCARDIAL PERFUSION IMAGING
Estimated workload: 8 METS
Exercise duration (min): 6 min
Exercise duration (sec): 41 s
LV dias vol: 68 mL (ref 46–106)
LV sys vol: 28 mL
MPHR: 147 {beats}/min
Peak HR: 141 {beats}/min
Percent HR: 95 %
RPE: 18
Rest HR: 69 {beats}/min
SDS: 1
SRS: 4
SSS: 5
TID: 0.98

## 2016-05-16 MED ORDER — TECHNETIUM TC 99M TETROFOSMIN IV KIT
11.0000 | PACK | Freq: Once | INTRAVENOUS | Status: AC | PRN
  Administered 2016-05-16: 11 via INTRAVENOUS
  Filled 2016-05-16: qty 11

## 2016-05-16 MED ORDER — TECHNETIUM TC 99M TETROFOSMIN IV KIT
32.1000 | PACK | Freq: Once | INTRAVENOUS | Status: AC | PRN
  Administered 2016-05-16: 32.1 via INTRAVENOUS
  Filled 2016-05-16: qty 33

## 2016-06-08 DIAGNOSIS — I2510 Atherosclerotic heart disease of native coronary artery without angina pectoris: Secondary | ICD-10-CM | POA: Diagnosis not present

## 2016-06-08 DIAGNOSIS — H608X3 Other otitis externa, bilateral: Secondary | ICD-10-CM | POA: Diagnosis not present

## 2016-06-08 DIAGNOSIS — Z86718 Personal history of other venous thrombosis and embolism: Secondary | ICD-10-CM | POA: Diagnosis not present

## 2016-06-08 DIAGNOSIS — K219 Gastro-esophageal reflux disease without esophagitis: Secondary | ICD-10-CM | POA: Diagnosis not present

## 2016-06-08 DIAGNOSIS — M810 Age-related osteoporosis without current pathological fracture: Secondary | ICD-10-CM | POA: Diagnosis not present

## 2016-06-08 DIAGNOSIS — E119 Type 2 diabetes mellitus without complications: Secondary | ICD-10-CM | POA: Diagnosis not present

## 2016-06-19 DIAGNOSIS — L299 Pruritus, unspecified: Secondary | ICD-10-CM | POA: Diagnosis not present

## 2016-06-19 DIAGNOSIS — H6240 Otitis externa in other diseases classified elsewhere, unspecified ear: Secondary | ICD-10-CM | POA: Diagnosis not present

## 2016-06-19 DIAGNOSIS — B369 Superficial mycosis, unspecified: Secondary | ICD-10-CM | POA: Diagnosis not present

## 2016-07-02 ENCOUNTER — Other Ambulatory Visit: Payer: Self-pay | Admitting: Internal Medicine

## 2016-07-02 DIAGNOSIS — Z1231 Encounter for screening mammogram for malignant neoplasm of breast: Secondary | ICD-10-CM

## 2016-07-06 ENCOUNTER — Ambulatory Visit (INDEPENDENT_AMBULATORY_CARE_PROVIDER_SITE_OTHER): Payer: Medicare HMO

## 2016-07-06 DIAGNOSIS — R928 Other abnormal and inconclusive findings on diagnostic imaging of breast: Secondary | ICD-10-CM

## 2016-07-06 DIAGNOSIS — Z1231 Encounter for screening mammogram for malignant neoplasm of breast: Secondary | ICD-10-CM | POA: Diagnosis not present

## 2016-07-08 ENCOUNTER — Inpatient Hospital Stay (HOSPITAL_COMMUNITY)
Admission: EM | Admit: 2016-07-08 | Discharge: 2016-07-10 | DRG: 439 | Disposition: A | Discharge status: Home / Self Care | Payer: Medicare HMO | Attending: Internal Medicine | Admitting: Internal Medicine

## 2016-07-08 ENCOUNTER — Encounter (HOSPITAL_COMMUNITY): Payer: Self-pay | Admitting: Emergency Medicine

## 2016-07-08 DIAGNOSIS — Z8509 Personal history of malignant neoplasm of other digestive organs: Secondary | ICD-10-CM | POA: Diagnosis present

## 2016-07-08 DIAGNOSIS — E139 Other specified diabetes mellitus without complications: Secondary | ICD-10-CM | POA: Diagnosis not present

## 2016-07-08 DIAGNOSIS — I10 Essential (primary) hypertension: Secondary | ICD-10-CM | POA: Diagnosis present

## 2016-07-08 DIAGNOSIS — Z7982 Long term (current) use of aspirin: Secondary | ICD-10-CM

## 2016-07-08 DIAGNOSIS — E785 Hyperlipidemia, unspecified: Secondary | ICD-10-CM | POA: Diagnosis present

## 2016-07-08 DIAGNOSIS — K8681 Exocrine pancreatic insufficiency: Secondary | ICD-10-CM | POA: Diagnosis present

## 2016-07-08 DIAGNOSIS — Z85068 Personal history of other malignant neoplasm of small intestine: Secondary | ICD-10-CM

## 2016-07-08 DIAGNOSIS — K219 Gastro-esophageal reflux disease without esophagitis: Secondary | ICD-10-CM | POA: Diagnosis present

## 2016-07-08 DIAGNOSIS — I708 Atherosclerosis of other arteries: Secondary | ICD-10-CM | POA: Diagnosis not present

## 2016-07-08 DIAGNOSIS — K861 Other chronic pancreatitis: Secondary | ICD-10-CM | POA: Diagnosis present

## 2016-07-08 DIAGNOSIS — K8500 Idiopathic acute pancreatitis without necrosis or infection: Secondary | ICD-10-CM | POA: Diagnosis not present

## 2016-07-08 DIAGNOSIS — E891 Postprocedural hypoinsulinemia: Secondary | ICD-10-CM | POA: Diagnosis not present

## 2016-07-08 DIAGNOSIS — K8689 Other specified diseases of pancreas: Secondary | ICD-10-CM | POA: Diagnosis not present

## 2016-07-08 DIAGNOSIS — K859 Acute pancreatitis without necrosis or infection, unspecified: Principal | ICD-10-CM

## 2016-07-08 DIAGNOSIS — I728 Aneurysm of other specified arteries: Secondary | ICD-10-CM | POA: Diagnosis not present

## 2016-07-08 DIAGNOSIS — R1012 Left upper quadrant pain: Secondary | ICD-10-CM | POA: Diagnosis not present

## 2016-07-08 DIAGNOSIS — R1013 Epigastric pain: Secondary | ICD-10-CM | POA: Diagnosis not present

## 2016-07-08 DIAGNOSIS — Z86718 Personal history of other venous thrombosis and embolism: Secondary | ICD-10-CM | POA: Diagnosis not present

## 2016-07-08 DIAGNOSIS — Z90411 Acquired partial absence of pancreas: Secondary | ICD-10-CM | POA: Diagnosis not present

## 2016-07-08 DIAGNOSIS — K858 Other acute pancreatitis without necrosis or infection: Secondary | ICD-10-CM | POA: Diagnosis not present

## 2016-07-08 HISTORY — DX: Type 2 diabetes mellitus without complications: E11.9

## 2016-07-08 LAB — GLUCOSE, CAPILLARY
Glucose-Capillary: 136 mg/dL — ABNORMAL HIGH (ref 65–99)
Glucose-Capillary: 164 mg/dL — ABNORMAL HIGH (ref 65–99)

## 2016-07-08 MED ORDER — METOPROLOL SUCCINATE ER 50 MG PO TB24: 50.0000 mg | ORAL_TABLET | Freq: Two times a day (BID) | ORAL | Status: DC

## 2016-07-08 MED ORDER — SUCRALFATE 1 G PO TABS
1.0000 g | ORAL_TABLET | Freq: Two times a day (BID) | ORAL | Status: DC
  Administered 2016-07-08 – 2016-07-10 (×4): 1 g via ORAL
  Filled 2016-07-08 (×4): qty 1

## 2016-07-08 MED ORDER — INSULIN ASPART 100 UNIT/ML ~~LOC~~ SOLN
0.0000 [IU] | SUBCUTANEOUS | Status: DC
  Administered 2016-07-08 – 2016-07-09 (×3): 2 [IU] via SUBCUTANEOUS
  Administered 2016-07-09 – 2016-07-10 (×3): 1 [IU] via SUBCUTANEOUS

## 2016-07-08 MED ORDER — SODIUM CHLORIDE 0.9 % IV SOLN
INTRAVENOUS | Status: DC
  Administered 2016-07-08 – 2016-07-10 (×2): via INTRAVENOUS

## 2016-07-08 MED ORDER — SIMVASTATIN 20 MG PO TABS
20.0000 mg | ORAL_TABLET | Freq: Every day | ORAL | Status: DC
  Administered 2016-07-08 – 2016-07-09 (×2): 20 mg via ORAL
  Filled 2016-07-08: qty 1

## 2016-07-08 MED ORDER — ENOXAPARIN SODIUM 40 MG/0.4ML ~~LOC~~ SOLN
40.0000 mg | SUBCUTANEOUS | Status: DC
  Administered 2016-07-08 – 2016-07-09 (×2): 40 mg via SUBCUTANEOUS
  Filled 2016-07-08 (×2): qty 0.4

## 2016-07-08 MED ORDER — MORPHINE SULFATE (PF) 4 MG/ML IV SOLN
2.0000 mg | INTRAVENOUS | Status: DC | PRN
  Administered 2016-07-08: 2 mg via INTRAVENOUS
  Administered 2016-07-09: 4 mg via INTRAVENOUS
  Administered 2016-07-09 (×2): 2 mg via INTRAVENOUS
  Administered 2016-07-09 (×2): 4 mg via INTRAVENOUS
  Administered 2016-07-09 – 2016-07-10 (×2): 2 mg via INTRAVENOUS
  Administered 2016-07-10: 4 mg via INTRAVENOUS
  Filled 2016-07-08 (×9): qty 1

## 2016-07-08 MED ORDER — PANTOPRAZOLE SODIUM 40 MG PO TBEC
40.0000 mg | DELAYED_RELEASE_TABLET | Freq: Every day | ORAL | Status: DC
  Administered 2016-07-08 – 2016-07-10 (×3): 40 mg via ORAL
  Filled 2016-07-08 (×3): qty 1

## 2016-07-08 MED ORDER — ONDANSETRON HCL 4 MG/2ML IJ SOLN: 4.0000 mg | Freq: Four times a day (QID) | INTRAMUSCULAR | Status: DC | PRN

## 2016-07-08 NOTE — ED Triage Notes (Signed)
Per [facility] patient comes from [facility] for acute pancreatitis with Dr Penelope Coop accepting her.  Patient c/o mid-epigastric pain x 3 days, with n/v/d the first night the only nausea since.  Patient has PMH Whipple procedure.  Patient has 20g right forearm and last given Morphine '4mg'$  at 1805.  Patient has elevated lipase 371.

## 2016-07-08 NOTE — H&P (Signed)
History and Physical    [name] PYP:[id number] DOB: 1941/02/16 DOA: 07/08/2016   PCP: Carol Cha, MD Chief Complaint:  Chief Complaint  Patient presents with  . Pancreatitis    HPI: [name] is a 76 y.o. female with medical history significant of Whipple procedure in 2012 for a T2N0 malignant low grade GIST of the duodenum, no known recurrence.  Also h/o GERD, HLD, Ulcers.  Patient presents to the ED at [facility] with C/O gradually worsening abdominal pain since onset 3 days ago.  Pain radiates from epigastric area to midback.  Associated N/V/D.  Has had several episodes of similar pain intermittently over past couple of years but always resolves on its own.  ED Course: In [place], lipase was 371.  CT scan shows status post whipple, inflammation of pancreas, dilation of pancreatic duct proximal to a calcification (possible pancreatic stone).  WBC normal, LFTs normal.  Review of Systems: As per HPI otherwise 10 point review of systems negative.    Past Medical History:  Diagnosis Date  . Cancer ([place])   . Diabetes mellitus without complication ([place])    pre-diabetic  . DVT of lower extremity (deep venous thrombosis) [facility]) March 2012  . GIST (gastrointestinal stromal tumor), malignant ([facility])    T2N0  . Reflux   . Ulcer [facility])     Past Surgical History:  Procedure Laterality Date  . BREAST SURGERY    . PANCREATICODUODENECTOMY  06/27/2010  . TUBAL LIGATION       reports that she has never smoked. She has never used smokeless tobacco. She reports that she does not drink alcohol or use drugs.  No Known Allergies  No family history on file. No recent illness in family    Prior to Admission medications   Medication Sig Start Date End Date Taking? Authorizing Provider  aspirin EC 81 MG tablet Take 81 mg by mouth daily.   Yes Historical Provider, MD  cholecalciferol (VITAMIN D) 1000 UNITS tablet Take 1,000 Units by mouth daily.    Yes  Historical Provider, MD  Coenzyme Q10 (CO Q 10 PO) Take 1 tablet by mouth daily.   Yes Historical Provider, MD  Melatonin 10 MG TABS Take 10 mg by mouth at bedtime.   Yes Historical Provider, MD  metoprolol succinate (TOPROL-XL) 100 MG 24 hr tablet Take 50-100 mg by mouth daily. Take '100mg'$  by mouth in the morning and '50mg'$  by mouth at night.   Yes Historical Provider, MD  pantoprazole (PROTONIX) 40 MG tablet Take 40 mg by mouth daily.     Yes Historical Provider, MD  simvastatin (ZOCOR) 20 MG tablet Take 20 mg by mouth daily.    Yes Historical Provider, MD  sucralfate (CARAFATE) 1 g tablet Take 1 g by mouth 2 (two) times daily. 07/05/16  Yes Historical Provider, MD    Physical Exam: Vitals:   07/08/16 1857 07/08/16 1943 07/08/16 2000  BP: 152/66 148/69 151/65  Pulse: 76 71 71  Resp: 12 16   Temp: 98.6 F (37 C)    TempSrc: Oral    SpO2: 98% 100% 94%  Weight: 69.4 kg (153 lb)    Height: '5\' 5"'$  (1.651 m)        Constitutional: NAD, calm, comfortable Eyes: PERRL, lids and conjunctivae normal ENMT: Mucous membranes are moist. Posterior pharynx clear of any exudate or lesions.Normal dentition.  Neck: normal, supple, no masses, no thyromegaly Respiratory: clear to auscultation bilaterally, no wheezing, no crackles. Normal respiratory effort. No accessory muscle  use.  Cardiovascular: Regular rate and rhythm, no murmurs / rubs / gallops. No extremity edema. 2+ pedal pulses. No carotid bruits.  Abdomen: no tenderness, no masses palpated. No hepatosplenomegaly. Bowel sounds positive.  Musculoskeletal: no clubbing / cyanosis. No joint deformity upper and lower extremities. Good ROM, no contractures. Normal muscle tone.  Skin: no rashes, lesions, ulcers. No induration Neurologic: CN 2-12 grossly intact. Sensation intact, DTR normal. Strength 5/5 in all 4.  Psychiatric: Normal judgment and insight. Alert and oriented x 3. Normal mood.    Labs on Admission: I have personally reviewed following  labs and imaging studies  CBC: No results for input(s): WBC, NEUTROABS, HGB, HCT, MCV, PLT in the last 168 hours. Basic Metabolic Panel: No results for input(s): NA, K, CL, CO2, GLUCOSE, BUN, CREATININE, CALCIUM, MG, PHOS in the last 168 hours. GFR: CrCl cannot be calculated (Patient's most recent lab result is older than the maximum 21 days allowed.). Liver Function Tests: No results for input(s): AST, ALT, ALKPHOS, BILITOT, PROT, ALBUMIN in the last 168 hours. No results for input(s): LIPASE, AMYLASE in the last 168 hours. No results for input(s): AMMONIA in the last 168 hours. Coagulation Profile: No results for input(s): INR, PROTIME in the last 168 hours. Cardiac Enzymes: No results for input(s): CKTOTAL, CKMB, CKMBINDEX, TROPONINI in the last 168 hours. BNP (last 3 results) No results for input(s): PROBNP in the last 8760 hours. HbA1C: No results for input(s): HGBA1C in the last 72 hours. CBG: No results for input(s): GLUCAP in the last 168 hours. Lipid Profile: No results for input(s): CHOL, HDL, LDLCALC, TRIG, CHOLHDL, LDLDIRECT in the last 72 hours. Thyroid Function Tests: No results for input(s): TSH, T4TOTAL, FREET4, T3FREE, THYROIDAB in the last 72 hours. Anemia Panel: No results for input(s): VITAMINB12, FOLATE, FERRITIN, TIBC, IRON, RETICCTPCT in the last 72 hours. Urine analysis:    Component Value Date/Time   COLORURINE YELLOW 06/23/2010 1405   APPEARANCEUR CLEAR 06/23/2010 1405   LABSPEC 1.024 06/23/2010 1405   PHURINE 5.0 06/23/2010 1405   HGBUR NEGATIVE 06/23/2010 [place] 06/23/2010 1405   KETONESUR NEGATIVE 06/23/2010 1405   PROTEINUR NEGATIVE 06/23/2010 1405   UROBILINOGEN 0.2 06/23/2010 1405   NITRITE NEGATIVE 06/23/2010 1405   LEUKOCYTESUR SMALL (A) 06/23/2010 1405   Sepsis Labs: '@LABRCNTIP'$ (procalcitonin:4,lacticidven:4) )No results found for this or any previous visit (from the past 240 hour(s)).   Radiological Exams on  Admission: No results found.  EKG: Independently reviewed.  Assessment/Plan Principal Problem:   Acute pancreatitis Active Problems:   Secondary diabetes mellitus ([place])   History of partial pancreatectomy   History of malignant gastrointestinal stromal tumor (GIST)    1. Acute pancreatitis of a Whipple pancreas likely due to obstruction of the pancreatic duct by pancreatic stone - 1. Clear liquid diet 2. Pain and nausea control 3. IVF 4. Repeat CBC, CMP, and lipase in AM 5. Spoke with Dr. Oletta Lamas: 1. Conservative management for now 2. They will eval patient tomorrow in hospital 3. Likely wont be able to do instrumentation of pancreas here in hospital if true Whipple anatomy 4. Hope is that conservative management can cool things down and if instrumentation is needed this can be done by referral to pancreas specialty center as outpatient 2. Secondary DM - 1. Sensitive scale SSI Q4H   DVT prophylaxis: Lovenox Code Status: Full Family Communication: No family in room Consults called: GI, spoke with Dr. Oletta Lamas, they will see patient in AM Admission status: Admit to inpatient  Etta Quill DO [facility] Pager [phone] from 7PM-7AM  If 7AM-7PM, please contact the day physician for the patient [email or web address] Password [email or web address]  07/08/2016, 8:39 PM

## 2016-07-08 NOTE — ED Provider Notes (Signed)
[facility] DEPT Provider Note   CSN: [id number] Arrival date & time: 07/08/16  1854  By signing my name below, I, Reola Mosher, attest that this documentation has been prepared under the direction and in the presence of Virgel Manifold, MD. Electronically Signed: Reola Mosher, ED Scribe. 07/08/16. 7:34 PM.  History   Chief Complaint Chief Complaint  Patient presents with  . Pancreatitis   The history is provided by the patient and medical records. No language interpreter was used.    HPI Comments: [name] is a 76 y.o. female BIB EMS, with a h/o T2N0 malignant GIST, GERD, HLD, and ulcers, who presents to the Emergency Department complaining of gradually worsening epigastric abdominal pain beginning three days ago. Pt reports radiation of her pain into her midback. She reports associated nausea, vomiting, diarrhea secondary to the onset of her pain. Pt notes that she has had several intermittent episodes of similar pain over several years, but each of these have resolved on their own each time. Pt was seen an evaluated at [facility] ER, and at that time she underwent blood work including a lipase which was found to be elevated at 371. She also had a CT scan performed which was read by radiology as "Status post Whipple. Calcification in the pancreatic duct near the resection margin with ductal dilation and mild peripancreatic inflammatory change consistent with acute pancreatitis. There is slight haziness in the left upper quadrant in the region of the splenic flexure which is also likely secondary to the pancreatitis rather than colitis." Pt was subsequently transferred to this ED for further management of admission to GI for follow-up in patient. Pt is s/p Whipple procedure for h/o prior small bowel benign tumors. Prior to her arrival in the ED she had a 20g placed to the right forearm and was last given '4mg'$  Morphine at 1805 w/ moderate relief of her  back pain, but not of her abdominal pain. She denies fever, cough, dysuria, or any other associated symptoms.   Past Medical History:  Diagnosis Date  . Cancer ([place])   . DVT of lower extremity (deep venous thrombosis) [facility]) March 2012  . GIST (gastrointestinal stromal tumor), malignant (HCC)    T2N0  . Reflux   . Ulcer [facility])    Patient Active Problem List   Diagnosis Date Noted  . Hyperlipidemia 05/14/2016  . Exocrine pancreatic insufficiency 06/04/2011  . Chest pain 11/20/2010  . GIST, malignant T2N0 11/20/2010  . Abdominal pain, epigastric 11/20/2010   Past Surgical History:  Procedure Laterality Date  . BREAST SURGERY    . PANCREATICODUODENECTOMY  06/27/2010   OB History    No data available     Home Medications    Prior to Admission medications   Medication Sig Start Date End Date Taking? Authorizing Provider  cholecalciferol (VITAMIN D) 1000 UNITS tablet Take 1,000 Units by mouth daily.     Historical Provider, MD  metoCLOPramide (REGLAN) 10 MG tablet Take 10 mg by mouth 2 (two) times daily.  09/30/11   Historical Provider, MD  metoprolol succinate (TOPROL-XL) 100 MG 24 hr tablet Take 100 mg by mouth daily. Take with or immediately following a meal.    Historical Provider, MD  multivitamin-iron-minerals-folic acid (CENTRUM) chewable tablet Chew 1 tablet by mouth daily.      Historical Provider, MD  pantoprazole (PROTONIX) 40 MG tablet Take 40 mg by mouth daily.      Historical Provider, MD  simvastatin (ZOCOR) 20 MG tablet  Take 20 mg by mouth 1 dose over 24 hours.      Historical Provider, MD   Family History No family history on file.  Social History Social History  Substance Use Topics  . Smoking status: Never Smoker  . Smokeless tobacco: Never Used  . Alcohol use No   Allergies   Patient has no known allergies.  Review of Systems Review of Systems  Constitutional: Negative for fever.  Respiratory: Negative for cough.   Gastrointestinal: Positive for  abdominal pain, diarrhea, nausea and vomiting.  Genitourinary: Negative for dysuria.  Musculoskeletal: Positive for back pain (midback, 2/ radiation).  All other systems reviewed and are negative.  Physical Exam Updated Vital Signs BP 152/66 (BP Location: Left Arm)   Pulse 76   Temp 98.6 F (37 C) (Oral)   Resp 12   Ht '5\' 5"'$  (1.651 m)   Wt 153 lb (69.4 kg)   SpO2 98%   BMI 25.46 kg/m   Physical Exam  Constitutional: She appears well-developed and well-nourished.  HENT:  Head: Normocephalic.  Right Ear: External ear normal.  Left Ear: External ear normal.  Nose: Nose normal.  Mouth/Throat: Oropharynx is clear and moist.  Eyes: Conjunctivae are normal. Right eye exhibits no discharge. Left eye exhibits no discharge.  Neck: Normal range of motion.  Cardiovascular: Normal rate, regular rhythm and normal heart sounds.   No murmur heard. Pulmonary/Chest: Effort normal and breath sounds normal. No respiratory distress. She has no wheezes. She has no rales.  Abdominal: Soft. She exhibits no distension. There is tenderness. There is no rebound and no guarding.  Mild tenderness in epigastrium to LUQ.   Musculoskeletal: Normal range of motion. She exhibits no edema or tenderness.  Neurological: She is alert. No cranial nerve deficit. Coordination normal.  Skin: Skin is warm and dry. No rash noted. No erythema. No pallor.  Psychiatric: She has a normal mood and affect. Her behavior is normal.  Nursing note and vitals reviewed.  ED Treatments / Results  DIAGNOSTIC STUDIES: Oxygen Saturation is 98% on RA, normal by my interpretation.   COORDINATION OF CARE: 7:15 PM-Discussed next steps with pt. Pt verbalized understanding and is agreeable with the plan.   Labs (all labs ordered are listed, but only abnormal results are displayed) Labs Reviewed - No data to display  EKG  EKG Interpretation None      Radiology No results found.  Procedures Procedures  Medications  Ordered in ED Medications - No data to display  Initial Impression / Assessment and Plan / ED Course  I have reviewed the triage vital signs and the nursing notes.  Pertinent labs & imaging results that were available during my care of the patient were reviewed by me and considered in my medical decision making (see chart for details).   Final Clinical Impressions(s) / ED Diagnoses   Final diagnoses:  Acute pancreatitis, unspecified complication status, unspecified pancreatitis type   New Prescriptions New Prescriptions   No medications on file   I personally preformed the services scribed in my presence. The recorded information has been reviewed is accurate. Virgel Manifold, MD.     Virgel Manifold, MD 07/19/16 [phone]

## 2016-07-08 NOTE — ED Notes (Signed)
Bed: XI35 Expected date:  Expected time:  Means of arrival:  Comments: [place] from [facility]

## 2016-07-09 DIAGNOSIS — E139 Other specified diabetes mellitus without complications: Secondary | ICD-10-CM

## 2016-07-09 DIAGNOSIS — K219 Gastro-esophageal reflux disease without esophagitis: Secondary | ICD-10-CM

## 2016-07-09 DIAGNOSIS — K861 Other chronic pancreatitis: Secondary | ICD-10-CM

## 2016-07-09 DIAGNOSIS — K859 Acute pancreatitis without necrosis or infection, unspecified: Principal | ICD-10-CM

## 2016-07-09 DIAGNOSIS — Z8509 Personal history of malignant neoplasm of other digestive organs: Secondary | ICD-10-CM

## 2016-07-09 DIAGNOSIS — I10 Essential (primary) hypertension: Secondary | ICD-10-CM

## 2016-07-09 LAB — COMPREHENSIVE METABOLIC PANEL
ALT: 10 U/L — ABNORMAL LOW (ref 14–54)
AST: 13 U/L — ABNORMAL LOW (ref 15–41)
Albumin: 3.3 g/dL — ABNORMAL LOW (ref 3.5–5.0)
Alkaline Phosphatase: 66 U/L (ref 38–126)
Anion gap: 9 (ref 5–15)
BUN: 9 mg/dL (ref 6–20)
CO2: 23 mmol/L (ref 22–32)
Calcium: 8.3 mg/dL — ABNORMAL LOW (ref 8.9–10.3)
Chloride: 104 mmol/L (ref 101–111)
Creatinine, Ser: 0.55 mg/dL (ref 0.44–1.00)
GFR calc Af Amer: >60 mL/min (ref >60)
GFR calc non Af Amer: >60 mL/min (ref >60)
Glucose, Bld: 117 mg/dL — ABNORMAL HIGH (ref 65–99)
Potassium: 3.6 mmol/L (ref 3.5–5.1)
Sodium: 136 mmol/L (ref 135–145)
Total Bilirubin: 0.6 mg/dL (ref 0.3–1.2)
Total Protein: 6.5 g/dL (ref 6.5–8.1)

## 2016-07-09 LAB — GLUCOSE, CAPILLARY
Glucose-Capillary: 101 mg/dL — ABNORMAL HIGH (ref 65–99)
Glucose-Capillary: 112 mg/dL — ABNORMAL HIGH (ref 65–99)
Glucose-Capillary: 120 mg/dL — ABNORMAL HIGH (ref 65–99)
Glucose-Capillary: 124 mg/dL — ABNORMAL HIGH (ref 65–99)
Glucose-Capillary: 127 mg/dL — ABNORMAL HIGH (ref 65–99)
Glucose-Capillary: 144 mg/dL — ABNORMAL HIGH (ref 65–99)

## 2016-07-09 LAB — CBC
HCT: 38.5 % (ref 36.0–46.0)
Hemoglobin: 12.8 g/dL (ref 12.0–15.0)
MCH: 30.3 pg (ref 26.0–34.0)
MCHC: 33.2 g/dL (ref 30.0–36.0)
MCV: 91.2 fL (ref 78.0–100.0)
Platelets: 202 10*3/uL (ref 150–400)
RBC: 4.22 MIL/uL (ref 3.87–5.11)
RDW: 13.1 % (ref 11.5–15.5)
WBC: 7.8 10*3/uL (ref 4.0–10.5)

## 2016-07-09 LAB — LIPASE, BLOOD
Lipase: 116 U/L — ABNORMAL HIGH (ref 11–51)
Lipase: 95 U/L — ABNORMAL HIGH (ref 11–51)

## 2016-07-09 MED ORDER — PANCRELIPASE (LIP-PROT-AMYL) 12000-38000 UNITS PO CPEP
24000.0000 [IU] | ORAL_CAPSULE | Freq: Three times a day (TID) | ORAL | Status: DC
  Administered 2016-07-10 (×2): 24000 [IU] via ORAL
  Filled 2016-07-09 (×2): qty 2

## 2016-07-09 MED ORDER — METOPROLOL TARTRATE 25 MG PO TABS
25.0000 mg | ORAL_TABLET | Freq: Two times a day (BID) | ORAL | Status: DC
  Administered 2016-07-09 – 2016-07-10 (×2): 25 mg via ORAL
  Filled 2016-07-09 (×2): qty 1

## 2016-07-09 MED ORDER — OXYCODONE HCL 5 MG PO TABS
5.0000 mg | ORAL_TABLET | Freq: Four times a day (QID) | ORAL | Status: DC | PRN
  Administered 2016-07-09 – 2016-07-10 (×4): 5 mg via ORAL
  Filled 2016-07-09 (×3): qty 1
  Filled 2016-07-09: qty 2

## 2016-07-09 NOTE — Consult Note (Signed)
[facility] CONSULT Reason for consult: pancreatitis Referring Physician: [facility]. PCP:Dr Varadarajan. Primary G.I.: Dr. Teena Irani  [name] is an 76 y.o. female.  HPI: in 2012 she had a Whipple procedure by Dr. Barry Dienes due to a T2 N0 malignant just. This was located in the duodenum and required a Whipple procedure. Initially, she was treated with Creon for pancreatic enzyme replacement but has not had any Creon in several years. The patient notes for the past 2 years she has had about 3 or 4 episodes of pain per year of varying intensities. The start off as epigastric pain she may have some vomiting. It will last several hours and then resolved. She has been seen in the past and told this was some mild pancreatitis. She also occasionally get some low-grade nausea with eating fatty foods. For the past 3 days she's having worsening abdominal pain from the epigastric area to the back. This was quite similar to her previous episodes however, the pain continued to slowly get worse rather than getting better. This caused her to go to the ER in [place]. CT scan showed prior Whipple procedure with dilated pancreatic duct with a question of a stone in the vicinity of the PD/bowel anastomosis. The patient feels a little better this morning. She is not ever been evaluated at a medical center. She has minimal chronic medical problems. She does take Protonix and Carafate for reflux.  Past Medical History:  Diagnosis Date  . Cancer ([place])   . Diabetes mellitus without complication ([place])    pre-diabetic  . DVT of lower extremity (deep venous thrombosis) [facility]) March 2012  . GIST (gastrointestinal stromal tumor), malignant (HCC)    T2N0  . Reflux   . Ulcer [facility])     Past Surgical History:  Procedure Laterality Date  . BREAST SURGERY    . PANCREATICODUODENECTOMY  06/27/2010  . TUBAL LIGATION      No family history on file.  Social History:  reports that she has never  smoked. She has never used smokeless tobacco. She reports that she does not drink alcohol or use drugs.  Allergies: No Known Allergies  Medications; Prior to Admission medications   Medication Sig Start Date End Date Taking? Authorizing Provider  aspirin EC 81 MG tablet Take 81 mg by mouth daily.   Yes Historical Provider, MD  cholecalciferol (VITAMIN D) 1000 UNITS tablet Take 1,000 Units by mouth daily.    Yes Historical Provider, MD  Coenzyme Q10 (CO Q 10 PO) Take 1 tablet by mouth daily.   Yes Historical Provider, MD  Melatonin 10 MG TABS Take 10 mg by mouth at bedtime.   Yes Historical Provider, MD  metoprolol succinate (TOPROL-XL) 100 MG 24 hr tablet Take 50-100 mg by mouth daily. Take 113m by mouth in the morning and 556mby mouth at night.   Yes Historical Provider, MD  pantoprazole (PROTONIX) 40 MG tablet Take 40 mg by mouth daily.     Yes Historical Provider, MD  simvastatin (ZOCOR) 20 MG tablet Take 20 mg by mouth daily.    Yes Historical Provider, MD  sucralfate (CARAFATE) 1 g tablet Take 1 g by mouth 2 (two) times daily. 07/05/16  Yes Historical Provider, MD   . enoxaparin (LOVENOX) injection  40 mg Subcutaneous Q24H  . insulin aspart  0-9 Units Subcutaneous Q4H  . pantoprazole  40 mg Oral Daily  . simvastatin  20 mg Oral Daily  . sucralfate  1 g Oral BID  PRN Meds morphine injection, ondansetron (ZOFRAN) IV Results for orders placed or performed during the hospital encounter of 07/08/16 (from the past 48 hour(s))  Glucose, capillary     Status: Abnormal   Collection Time: 07/08/16 10:21 PM  Result Value Ref Range   Glucose-Capillary 164 (H) 65 - 99 mg/dL  Glucose, capillary     Status: Abnormal   Collection Time: 07/08/16 11:48 PM  Result Value Ref Range   Glucose-Capillary 136 (H) 65 - 99 mg/dL  Glucose, capillary     Status: Abnormal   Collection Time: 07/09/16  3:24 AM  Result Value Ref Range   Glucose-Capillary 101 (H) 65 - 99 mg/dL  CBC     Status: None    Collection Time: 07/09/16  4:35 AM  Result Value Ref Range   WBC 7.8 4.0 - 10.5 K/uL   RBC 4.22 3.87 - 5.11 MIL/uL   Hemoglobin 12.8 12.0 - 15.0 g/dL   HCT 38.5 36.0 - 46.0 %   MCV 91.2 78.0 - 100.0 fL   MCH 30.3 26.0 - 34.0 pg   MCHC 33.2 30.0 - 36.0 g/dL   RDW 13.1 11.5 - 15.5 %   Platelets 202 150 - 400 K/uL  Comprehensive metabolic panel     Status: Abnormal   Collection Time: 07/09/16  4:35 AM  Result Value Ref Range   Sodium 136 135 - 145 mmol/L   Potassium 3.6 3.5 - 5.1 mmol/L   Chloride 104 101 - 111 mmol/L   CO2 23 22 - 32 mmol/L   Glucose, Bld 117 (H) 65 - 99 mg/dL   BUN 9 6 - 20 mg/dL   Creatinine, Ser 0.55 0.44 - 1.00 mg/dL   Calcium 8.3 (L) 8.9 - 10.3 mg/dL   Total Protein 6.5 6.5 - 8.1 g/dL   Albumin 3.3 (L) 3.5 - 5.0 g/dL   AST 13 (L) 15 - 41 U/L   ALT 10 (L) 14 - 54 U/L   Alkaline Phosphatase 66 38 - 126 U/L   Total Bilirubin 0.6 0.3 - 1.2 mg/dL   GFR calc non Af Amer >60 >60 mL/min   GFR calc Af Amer >60 >60 mL/min    Comment: (NOTE) The eGFR has been calculated using the CKD EPI equation. This calculation has not been validated in all clinical situations. eGFR's persistently <60 mL/min signify possible Chronic Kidney Disease.    Anion gap 9 5 - 15  Lipase, blood     Status: Abnormal   Collection Time: 07/09/16  4:35 AM  Result Value Ref Range   Lipase 116 (H) 11 - 51 U/L    No results found. ROS: Constitutional: normally feels quite well unless she is having one of her episodes HEENT: negative  Cardiovascular: no exertional chest pain, mild hypertension Respiratory: negative negative GI: patient denies chronic diarrhea or weight loss. GU: no dysuria Musculoskeletal: negative Neuro/Psychiatric: negative Endocrine/Heme: negative  CT report from [facility] in [place]:  1. Status post Whipple. Calcification in the pancreatic duct near the resection margin with ductal dilatation and mild peripancreatic inflammatory change consistent  with acute pancreatitis. There is slight haziness in the left upper quadrant in the  region of the splenic flexure which is also likely secondary to the pancreatitis rather than colitis. 2. Atherosclerosis. Thrombosed splenic artery aneurysm.  Result Narrative  TECHNIQUE: Axial CT abdomen and pelvis after 80cc Isovue-370 contrast. Radiation dose reduction was utilized (automated exposure control, mA or kV adjustment based on patient size, or iterative image reconstruction).  COMPARISON:None INDICATION: L flank/LLQ pain  FINDINGS:  CHEST: Subsegmental atelectasis or scarring in the bilateral lower lobes.  ABDOMEN AND PELVIS: Liver: Small cyst left hepatic lobe. Gallbladder and Bile ducts: Cholecystectomy and choledochoenterostomy. The left hepatic duct measures approximately 7 mm on coronal image 43. No peripheral intrahepatic dilatation. Pancreas: The patient is status post Whipple. There is abrupt dilation of the pancreatic duct at the level of the pancreatic body with a 4 mm calcification centrally within the duct, axial image 43. Lateral to this in the expected region of the  pancreatic head there is an abrupt change in the enhancement pattern likely due to mobilized small bowel in this region, as well as some calcification on axial image 61. There is mild haziness in the peripancreatic body/tail fat without a focal fluid  collection. Spleen: Within normal limits. Adrenals: Within normal limits.  GI tract:Distal gastric resection/Whipple. Mobilized small bowel in the right upper quadrant. Gastrojejunostomy appears patent. No small bowel obstruction or enteritis. Mild diverticulosis of the colon. There is a small amount of haziness in the fat of  the splenic flexure region. Appendix: Within normal limits.  Kidneys: Within normal limits.  Ureters: Within normal limits. Urinary bladder: Within normal limits. Reproductive: Within normal limits.  Vascular: Mild atherosclerosis. There  is a rim calcified 1 cm aneurysm of the splenic artery which appears thrombosed, axial image 33.  Peritoneum/Extraperitoneum: No free fluid or free air.  Musculoskeletal: No acute osseous abnormality.  Status Results Details    Hospital Encounter on 07/08/2016 [facility]")' [email or web address] Summary  2015           Blood pressure (!) 148/63, pulse 82, temperature 98.2 F (36.8 C), temperature source Oral, resp. rate 18, height 5' 5"  (1.651 m), weight 69.4 kg (153 lb), SpO2 96 %.  Physical exam:   General-- alert white female in absolutely no distress ENT-- nonicteric Neck-- no lymphadenopathy Heart-- regular rate and rhythm without murmurs are gallops Lungs-- clear Abdomen-- nondistended, bowel sounds present mild if any epigastric tenderness. Psych-- alert and oriented with appropriate responses   Assessment: 1. Acute pancreatitis. This is actually a more severe episode of a chronic problem. For the past several years she's been have been several attacks the year and this is her worst attack. Based on the CT result she may well have the pancreatic stone in the pancreatic duct that needs extraction. This is likely beyond the abilities of anyone in [place] and she will likely need to be seen in a major medical center. Fortunately, when she has these attacks she does get better and is relatively asymptomatic between them. If this is the case she could be discharged in referred as an outpatient. 2. Status post Whipple procedure 2012 for malignant GIST duodenal tumor.  Plan: at this point we will go ahead and continue to treat her for acute pancreatitis with conservative measures. Will place on fat-free diet and will add Creon. We will follow with you.   Quintavia Rogstad JR,Aydeen Blume L 07/09/2016, 7:25 AM   This note was created using voice recognition software and minor errors may Have occurred unintentionally. Pager:  [phone] If no answer or after hours call [phone]

## 2016-07-09 NOTE — Progress Notes (Signed)
[facility] PROGRESS NOTE  ARDEN AXON KGM:[id number] DOB: Aug 09, 1940 DOA: 07/08/2016 PCP: [name], MD  Interim summary and HPI 76 y.o. female with medical history significant of Whipple procedure in 2012 for a T2N0 malignant low grade GIST of the duodenum, no known recurrence.  Also h/o GERD, HLD, Ulcers.  Patient presents to the ED at [facility] with C/O gradually worsening abdominal pain since onset 3 days ago.  Pain radiates from epigastric area to midback.  Associated N/V/D.  Assessment/Plan: 1. Acute on chronic pancreatitis: patient with prior hx of whipple surgery and concerns for potential calcification or scar causing obstruction of pancreatic duct. Other concerns included stocked stone. -GI is on board and planning for refersal as an outpatient to pancreatic specialist center (for further evaluations and treatment) -meanwhile will continue conservative management and advance diet as tolerate -PO pain meds if possible -PRN antiemetics -start creon and continue supportive care -continue IVF resuscitation -follow lipase in am  2-diabetes mellitus -will continue SSI  3-hx of GIST -s/p resection -no signs of recurrence -continue outpatient follow up   4-GERD: -will continue PPI  5-HLD -will resume statins   6-HTN -taking B-blocker at home -will slowly resume when able to tolerate PO's  Code Status: full Family Communication: no family at bedside Disposition Plan: remains in the hospital; continue current treatment and supportive care. Hopefully home in 1-2 days.   Consultants:  GI  Procedures:  See below for x-ray reports   Antibiotics:  none  HPI/Subjective: Reports feeling somewhat better. No vomiting, but still nauseated and with some mid epigastric pain. No fever.  Objective: Vitals:   07/09/16 0955 07/09/16 1424  BP: (!) 152/67 (!) 158/62  Pulse: 73 78  Resp: 18 18  Temp: 98.9 F (37.2 C) 99.1 F (37.3 C)     Intake/Output Summary (Last 24 hours) at 07/09/16 1759 Last data filed at 07/09/16 1400  Gross per 24 hour  Intake          2391.67 ml  Output              905 ml  Net          1486.67 ml   Filed Weights   07/08/16 1857  Weight: 69.4 kg (153 lb)    Exam:   General:  Afebrile, still reporting mid epigastric discomfort and nausea; no further vomiting reported. Patient will like to try something to eat and some oral pain meds (hoping will last longer)  Cardiovascular: S1 and S2, no rubs, no gallops  Respiratory: CTA bilaterally  Abdomen: soft, ND, positive BS, no guarding  Musculoskeletal: no edema, no cyanosis  Data Reviewed: Basic Metabolic Panel:  Recent Labs Lab 07/09/16 0435  NA 136  K 3.6  CL 104  CO2 23  GLUCOSE 117*  BUN 9  CREATININE 0.55  CALCIUM 8.3*   Liver Function Tests:  Recent Labs Lab 07/09/16 0435  AST 13*  ALT 10*  ALKPHOS 66  BILITOT 0.6  PROT 6.5  ALBUMIN 3.3*    Recent Labs Lab 07/09/16 0435  LIPASE 116*   CBC:  Recent Labs Lab 07/09/16 0435  WBC 7.8  HGB 12.8  HCT 38.5  MCV 91.2  PLT 202   CBG:  Recent Labs Lab 07/08/16 2348 07/09/16 0324 07/09/16 0734 07/09/16 1138 07/09/16 1625  GLUCAP 136* 101* 144* 127* 112*    Studies: No results found.  Scheduled Meds: . enoxaparin (LOVENOX) injection  40 mg Subcutaneous Q24H  . insulin aspart  0-9  Units Subcutaneous Q4H  . [START ON 07/10/2016] lipase/protease/amylase  24,000 Units Oral TID AC  . pantoprazole  40 mg Oral Daily  . simvastatin  20 mg Oral Daily  . sucralfate  1 g Oral BID   Continuous Infusions: . sodium chloride 125 mL/hr at 07/08/16 2204    Principal Problem:   Acute pancreatitis Active Problems:   Secondary diabetes mellitus ([place])   History of partial pancreatectomy   History of malignant gastrointestinal stromal tumor (GIST)    Barton Dubois  [facility] Pager [phone]. If 7PM-7AM, please contact night-coverage at  [email or web address], password [email or web address] 07/09/2016, 5:59 PM  LOS: 1 day

## 2016-07-10 DIAGNOSIS — I10 Essential (primary) hypertension: Secondary | ICD-10-CM

## 2016-07-10 DIAGNOSIS — E785 Hyperlipidemia, unspecified: Secondary | ICD-10-CM

## 2016-07-10 DIAGNOSIS — K858 Other acute pancreatitis without necrosis or infection: Secondary | ICD-10-CM

## 2016-07-10 LAB — CBC
HCT: 37.1 % (ref 36.0–46.0)
Hemoglobin: 12.6 g/dL (ref 12.0–15.0)
MCH: 30.5 pg (ref 26.0–34.0)
MCHC: 34 g/dL (ref 30.0–36.0)
MCV: 89.8 fL (ref 78.0–100.0)
Platelets: 176 10*3/uL (ref 150–400)
RBC: 4.13 MIL/uL (ref 3.87–5.11)
RDW: 12.6 % (ref 11.5–15.5)
WBC: 8.6 10*3/uL (ref 4.0–10.5)

## 2016-07-10 LAB — GLUCOSE, CAPILLARY
Glucose-Capillary: 121 mg/dL — ABNORMAL HIGH (ref 65–99)
Glucose-Capillary: 123 mg/dL — ABNORMAL HIGH (ref 65–99)
Glucose-Capillary: 148 mg/dL — ABNORMAL HIGH (ref 65–99)
Glucose-Capillary: 126 mg/dL — ABNORMAL HIGH (ref 65–99)

## 2016-07-10 LAB — BASIC METABOLIC PANEL
Anion gap: 9 (ref 5–15)
BUN: 7 mg/dL (ref 6–20)
CO2: 21 mmol/L — ABNORMAL LOW (ref 22–32)
Calcium: 8.2 mg/dL — ABNORMAL LOW (ref 8.9–10.3)
Chloride: 104 mmol/L (ref 101–111)
Creatinine, Ser: 0.54 mg/dL (ref 0.44–1.00)
GFR calc Af Amer: >60 mL/min (ref >60)
GFR calc non Af Amer: >60 mL/min (ref >60)
Glucose, Bld: 132 mg/dL — ABNORMAL HIGH (ref 65–99)
Potassium: 3.5 mmol/L (ref 3.5–5.1)
Sodium: 134 mmol/L — ABNORMAL LOW (ref 135–145)

## 2016-07-10 MED ORDER — DOCUSATE SODIUM 50 MG PO CAPS: 50.0000 mg | ORAL_CAPSULE | Freq: Two times a day (BID) | ORAL | 0 refills | Status: DC

## 2016-07-10 MED ORDER — POLYETHYLENE GLYCOL 3350 17 G PO PACK
17.0000 g | PACK | Freq: Every day | ORAL | 0 refills | Status: DC | PRN
Start: 2016-07-10 — End: 2020-05-12

## 2016-07-10 MED ORDER — PANCRELIPASE (LIP-PROT-AMYL) 24000-76000 UNITS PO CPEP: 24000.0000 [IU] | ORAL_CAPSULE | Freq: Three times a day (TID) | ORAL | 1 refills | Status: DC

## 2016-07-10 MED ORDER — OXYCODONE HCL 5 MG PO TABS: 5.0000 mg | ORAL_TABLET | Freq: Four times a day (QID) | ORAL | 0 refills | Status: DC | PRN

## 2016-07-10 MED ORDER — ONDANSETRON 8 MG PO TBDP: 8.0000 mg | ORAL_TABLET | Freq: Three times a day (TID) | ORAL | 0 refills | Status: DC | PRN

## 2016-07-10 NOTE — Progress Notes (Signed)
Pt discharged home; discharge instructions explained to client, pt given a copy, all questions answered; IV removed with no complications noted; pt alert, oriented, and ambulatory

## 2016-07-10 NOTE — Discharge Summary (Signed)
Physician Discharge Summary  KARISSA MEENAN XNA:[id number] DOB: 1940/06/02 DOA: 74/03/2017  PCP: [name], MD  Admit date: 07/08/2016 Discharge date: 07/10/2016  Time spent: 35 minutes  Recommendations for Outpatient Follow-up:  Repeat CMET to follow electrolytes and renal function  Repeat Lipase level  Discharge Diagnoses:  Principal Problem:   Acute pancreatitis Active Problems:   Secondary diabetes mellitus ([place])   History of partial pancreatectomy   History of malignant gastrointestinal stromal tumor (GIST)   Essential hypertension GERD HTN HLD  Discharge Condition: stable and improved. Will discharge home. 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 Also h/o GERD, HLD, Ulcers. Patient presents to the ED at [facility] with C/O gradually worsening abdominal pain since onset 3 days ago. Pain radiates from epigastric area to midback. Associated N/V/D.  Hospital Course:  1-Acute on chronic pancreatitis: patient with prior hx of whipple surgery and concerns for potential calcification or scar causing obstruction of pancreatic duct. Other concerns included stocked stone. -GI recommended to start Creon and to follow low fat diet -will provide prescriptions for oral analgesics and antiemetics -will follow up with Dr. Amedeo Plenty on 2/16; most likely will be referred if needed to a pancreatic specialist   2-diabetes mellitus -will recommend to continue low carb diet   3-hx of GIST -s/p resection -no signs of recurrence -continue outpatient follow up   4-GERD: -will continue PPI  5-HLD -will resume statins   6-HTN -will resume home  antihypertensive regimen  -advise to follow heart healthy diet   Procedures:  See below for x-ray reports   Consultations:  GI   Discharge Exam: Vitals:   07/10/16 0541 07/10/16 1348  BP: (!) 145/74 (!) 155/69  Pulse: 70 72  Resp: 18 16  Temp: 98.2 F (36.8 C) 99.9 F (37.7 C)    General:  Afebrile, still reporting some mid epigastric discomfort and nausea; but deneis further vomiting. Patient tolerating CLD and some full liquid options. Endorses poor appetite. She wants to go home. Oral pain meds, controlling her discomfort well.  Cardiovascular: S1 and S2, no rubs, no gallops  Respiratory: CTA bilaterally  Abdomen: soft, ND, positive BS, no guarding  Musculoskeletal: no edema, no cyanosis   Discharge Instructions   Discharge Instructions    Discharge instructions    Complete by:  As directed    Keep yourself well hydrated Take medications as prescribed  Arrange follow up with PCP in 2 weeks Follow up with Dr. Amedeo Plenty on 07/13/16 (contact office for appointment details) Follow a low fat diet     Current Discharge Medication List    START taking these medications   Details  docusate sodium (COLACE) 50 MG capsule Take 1 capsule (50 mg total) by mouth 2 (two) times daily. Hold for diarrhea Qty: 40 capsule, Refills: 0    lipase/protease/amylase 24000-76000 units CPEP Take 1 capsule (24,000 Units total) by mouth 3 (three) times daily before meals. Qty: 270 capsule, Refills: 1    ondansetron (ZOFRAN ODT) 8 MG disintegrating tablet Take 1 tablet (8 mg total) by mouth every 8 (eight) hours as needed for nausea or vomiting. Qty: 20 tablet, Refills: 0    oxyCODONE (OXY IR/ROXICODONE) 5  MG immediate release tablet Take 1-2 tablets (5-10 mg total) by mouth every 6 (six) hours as needed for severe pain. Qty: 30 tablet, Refills: 0    polyethylene glycol (MIRALAX / GLYCOLAX) packet Take 17 g by mouth daily as needed for mild constipation. Qty: 28 each, Refills: 0       CONTINUE these medications which have NOT CHANGED   Details  aspirin EC 81 MG tablet Take 81 mg by mouth daily.    cholecalciferol (VITAMIN D) 1000 UNITS tablet Take 1,000 Units by mouth daily.     Coenzyme Q10 (CO Q 10 PO) Take 1 tablet by mouth daily.    Melatonin 10 MG TABS Take 10 mg by mouth at bedtime.    metoprolol succinate (TOPROL-XL) 100 MG 24 hr tablet Take 50-100 mg by mouth daily. Take '100mg'$  by mouth in the morning and '50mg'$  by mouth at night.    pantoprazole (PROTONIX) 40 MG tablet Take 40 mg by mouth daily.      simvastatin (ZOCOR) 20 MG tablet Take 20 mg by mouth daily.     sucralfate (CARAFATE) 1 g tablet Take 1 g by mouth 2 (two) times daily.       No Known Allergies Follow-up Information    HAYES,JOHN C, MD. Go on 07/13/2016.   Specialty:  Gastroenterology Why:  at 9:57 am Contact information: [address] [place] [address] [phone]        [name], MD. Schedule an appointment as soon as possible for a visit in 2 week(s).   Specialty:  Internal Medicine Contact information: [address] [place] [address] [phone]           The results of significant diagnostics from this hospitalization (including imaging, microbiology, ancillary and laboratory) are listed below for reference.    Significant Diagnostic Studies: Mm Digital Screening Bilateral  Result Date: 07/06/2016 CLINICAL DATA:  Screening. Prior bilateral breast implant explantation December 2015. EXAM: DIGITAL SCREENING BILATERAL MAMMOGRAM WITH CAD COMPARISON:  Previous exam(s). ACR Breast Density Category b: There are scattered areas of fibroglandular density. FINDINGS: In the left breast, calcifications warrant further evaluation. In the right breast, no findings suspicious for malignancy. Images were processed with CAD. IMPRESSION: Further evaluation is suggested for calcifications in the left breast. RECOMMENDATION: Diagnostic  mammogram of the left breast. (Code:[id number]) The patient will be contacted regarding the findings, and additional imaging will be scheduled. BI-RADS CATEGORY  0: Incomplete. Need additional imaging evaluation and/or prior mammograms for comparison. Electronically Signed   By: Everlean Alstrom M.D.   On: 07/06/2016 14:04   Labs: Basic Metabolic Panel:  Recent Labs Lab 07/09/16 0435 07/10/16 0444  NA 136 134*  K 3.6 3.5  CL 104 104  CO2 23 21*  GLUCOSE 117* 132*  BUN 9 7  CREATININE 0.55 0.54  CALCIUM 8.3* 8.2*   Liver Function Tests:  Recent Labs Lab 07/09/16 0435  AST 13*  ALT 10*  ALKPHOS 66  BILITOT 0.6  PROT 6.5  ALBUMIN 3.3*    Recent Labs Lab 07/09/16 0435 07/09/16 1817  LIPASE 116* 95*   CBC:  Recent Labs Lab 07/09/16 0435 07/10/16 0444  WBC 7.8 8.6  HGB 12.8 12.6  HCT 38.5 37.1  MCV 91.2 89.8  PLT 202 176   CBG:  Recent Labs Lab 07/09/16 1943 07/09/16 2337 07/10/16 0337 07/10/16 0729 07/10/16 1134  GLUCAP 124* 120* 121* 123* 148*    Signed:  Barton Dubois MD.  [facility] 07/10/2016, 3:47 PM

## 2016-07-10 NOTE — Progress Notes (Signed)
Pt discharged from the unit via wheelchair. Discharge instructions were reviewed with the pt and friend. Pt given prescriptions. No questions or concerns from the pt at this time.  Breckyn Troyer W Pauline Trainer, RN

## 2016-07-10 NOTE — Progress Notes (Signed)
[facility] PROGRESS NOTE Subjective patient states that she is feeling better since coming into the hospital is still having some slight pain and feeling somewhat sick. She's not vomited. Her labs have been okay in her lipase is slowly coming down. She's passing gas.  Objective: Vital signs in last 24 hours: Temp:  [98.2 F (36.8 C)-99.1 F (37.3 C)] 98.2 F (36.8 C) (02/13 0541) Pulse Rate:  [70-78] 70 (02/13 0541) Resp:  [16-18] 18 (02/13 0541) BP: (142-158)/(62-74) 145/74 (02/13 0541) SpO2:  [95 %-98 %] 95 % (02/13 0541) Last BM Date: 07/05/16  Intake/Output from previous day: 02/12 0701 - 02/13 0700 In: 1400 [P.O.:400; I.V.:1000] Out: 2310 [Urine:2310] Intake/Output this shift: Total I/O In: 240 [P.O.:240] Out: 300 [Urine:300]  PE: General-- white female in no acute distress  Abdomen-- minimal epigastric tenderness.  Lab Results:  Recent Labs  07/09/16 0435 07/10/16 0444  WBC 7.8 8.6  HGB 12.8 12.6  HCT 38.5 37.1  PLT 202 176   BMET  Recent Labs  07/09/16 0435 07/10/16 0444  NA 136 134*  K 3.6 3.5  CL 104 104  CO2 23 21*  CREATININE 0.55 0.54   LFT  Recent Labs  07/09/16 0435  PROT 6.5  AST 13*  ALT 10*  ALKPHOS 66  BILITOT 0.6   PT/INR No results for input(s): LABPROT, INR in the last 72 hours. PANCREAS  Recent Labs  07/09/16 0435 07/09/16 1817  LIPASE 116* 95*         Studies/Results: No results found.  Medications: I have reviewed the patient's current medications.  Assessment/Plan: 1. Acute pancreatitis. The patient has had previous Whipple procedure for GIS tumor. Her imaging studies suggest calcification in the pancreatic duct. She is clinically had several episodes like this over the past 2 years with resolution but they're getting worse. She will likely need to be referred to a major medical center would biliary expertise. She would like to go home since she is taking a diet and oral pain medications. I  would discharge her on a no fat diet and oral pain medicines. I made an appointment for her this Thursday 2/16 at 11 o'clock with her gastroenterologist Dr. Amedeo Plenty. This is discuss whether or not to refer to a medical center for biliary extraction of the pancreatic duct stones.   Antawan Mchugh JR,Sincerity Cedar L 07/10/2016, 9:51 AM  This note was created using voice recognition software. Minor errors may Have occurred unintentionally.  Pager: [phone] If no answer or after hours call [phone]

## 2016-07-10 NOTE — Progress Notes (Signed)
[facility] PROGRESS NOTE Subjective patient feeling some better but still a little uncomfortable. No nausea or vomiting she is urinating and passing gas.  Objective: Vital signs in last 24 hours: Temp:  [98.2 F (36.8 C)-99.1 F (37.3 C)] 98.2 F (36.8 C) (02/13 0541) Pulse Rate:  [70-78] 70 (02/13 0541) Resp:  [16-18] 18 (02/13 0541) BP: (142-158)/(62-74) 145/74 (02/13 0541) SpO2:  [95 %-98 %] 95 % (02/13 0541) Last BM Date: 07/05/16  Intake/Output from previous day: 02/12 0701 - 02/13 0700 In: 1400 [P.O.:400; I.V.:1000] Out: 2310 [Urine:2310] Intake/Output this shift: Total I/O In: 240 [P.O.:240] Out: 300 [Urine:300]  PE: General-- no acute distress  Abdomen-- minimal epigastric tenderness  Lab Results:  Recent Labs  07/09/16 0435 07/10/16 0444  WBC 7.8 8.6  HGB 12.8 12.6  HCT 38.5 37.1  PLT 202 176   BMET  Recent Labs  07/09/16 0435 07/10/16 0444  NA 136 134*  K 3.6 3.5  CL 104 104  CO2 23 21*  CREATININE 0.55 0.54   LFT  Recent Labs  07/09/16 0435  PROT 6.5  AST 13*  ALT 10*  ALKPHOS 66  BILITOT 0.6   PT/INR No results for input(s): LABPROT, INR in the last 72 hours. PANCREAS  Recent Labs  07/09/16 0435 07/09/16 1817  LIPASE 116* 95*         Studies/Results: No results found.  Medications: I have reviewed the patient's current medications.  Assessment/Plan: 1. Acute pancreatitis and woman who has had previous Whipple procedure. Imaging studies suggest dilated pancreatic duct with calcifications in the duct. She will likely need to be referred to a biliary center with expertise in pancreatic duct extraction of stones. She seems to be tolerating's moral food with oral pain medications and would like to be discharged home. Would center home made no fat diet and oral pain medications. Have made an appointment for her this Friday 2/16 at 11 AM with Dr. Amedeo Plenty to discuss referral to a major medical center with biliary  expertise.   Alyzza Andringa JR,Rockne Dearinger L 07/10/2016, 9:57 AM  This note was created using voice recognition software. Minor errors may Have occurred unintentionally.  Pager: [phone] If no answer or after hours call [phone]

## 2016-07-12 ENCOUNTER — Other Ambulatory Visit: Payer: Self-pay | Admitting: Internal Medicine

## 2016-07-12 DIAGNOSIS — R928 Other abnormal and inconclusive findings on diagnostic imaging of breast: Secondary | ICD-10-CM

## 2016-07-13 DIAGNOSIS — R748 Abnormal levels of other serum enzymes: Secondary | ICD-10-CM | POA: Diagnosis not present

## 2016-07-13 DIAGNOSIS — K8689 Other specified diseases of pancreas: Secondary | ICD-10-CM | POA: Diagnosis not present

## 2016-07-13 DIAGNOSIS — C49A9 Gastrointestinal stromal tumor of other sites: Secondary | ICD-10-CM | POA: Diagnosis not present

## 2016-07-25 ENCOUNTER — Ambulatory Visit
Admission: RE | Admit: 2016-07-25 | Discharge: 2016-07-25 | Disposition: A | Discharge status: Home / Self Care | Payer: Medicare HMO | Source: Ambulatory Visit | Attending: Internal Medicine | Admitting: Internal Medicine

## 2016-07-25 ENCOUNTER — Other Ambulatory Visit: Payer: Self-pay | Admitting: Internal Medicine

## 2016-07-25 DIAGNOSIS — E119 Type 2 diabetes mellitus without complications: Secondary | ICD-10-CM | POA: Diagnosis not present

## 2016-07-25 DIAGNOSIS — T8549XA Other mechanical complication of breast prosthesis and implant, initial encounter: Secondary | ICD-10-CM | POA: Diagnosis not present

## 2016-07-25 DIAGNOSIS — I2510 Atherosclerotic heart disease of native coronary artery without angina pectoris: Secondary | ICD-10-CM | POA: Diagnosis not present

## 2016-07-25 DIAGNOSIS — R928 Other abnormal and inconclusive findings on diagnostic imaging of breast: Secondary | ICD-10-CM | POA: Diagnosis not present

## 2016-07-25 DIAGNOSIS — R921 Mammographic calcification found on diagnostic imaging of breast: Secondary | ICD-10-CM | POA: Diagnosis not present

## 2016-07-25 DIAGNOSIS — K859 Acute pancreatitis without necrosis or infection, unspecified: Secondary | ICD-10-CM | POA: Diagnosis not present

## 2016-07-30 ENCOUNTER — Ambulatory Visit
Admission: RE | Admit: 2016-07-30 | Discharge: 2016-07-30 | Disposition: A | Discharge status: Home / Self Care | Payer: Medicare HMO | Source: Ambulatory Visit | Attending: Internal Medicine | Admitting: Internal Medicine

## 2016-07-30 ENCOUNTER — Other Ambulatory Visit: Payer: Self-pay | Admitting: Internal Medicine

## 2016-07-30 DIAGNOSIS — R928 Other abnormal and inconclusive findings on diagnostic imaging of breast: Secondary | ICD-10-CM

## 2016-07-30 DIAGNOSIS — R921 Mammographic calcification found on diagnostic imaging of breast: Secondary | ICD-10-CM | POA: Diagnosis not present

## 2016-07-30 DIAGNOSIS — N6012 Diffuse cystic mastopathy of left breast: Secondary | ICD-10-CM | POA: Diagnosis not present

## 2016-07-31 DIAGNOSIS — N898 Other specified noninflammatory disorders of vagina: Secondary | ICD-10-CM | POA: Diagnosis not present

## 2016-07-31 DIAGNOSIS — N950 Postmenopausal bleeding: Secondary | ICD-10-CM | POA: Diagnosis not present

## 2016-07-31 DIAGNOSIS — R102 Pelvic and perineal pain: Secondary | ICD-10-CM | POA: Diagnosis not present

## 2016-08-13 DIAGNOSIS — N6489 Other specified disorders of breast: Secondary | ICD-10-CM | POA: Diagnosis not present

## 2016-08-14 DIAGNOSIS — N950 Postmenopausal bleeding: Secondary | ICD-10-CM | POA: Diagnosis not present

## 2016-08-14 DIAGNOSIS — R102 Pelvic and perineal pain: Secondary | ICD-10-CM | POA: Diagnosis not present

## 2016-08-14 DIAGNOSIS — N882 Stricture and stenosis of cervix uteri: Secondary | ICD-10-CM | POA: Diagnosis not present

## 2016-08-20 ENCOUNTER — Other Ambulatory Visit: Payer: Self-pay | Admitting: Obstetrics and Gynecology

## 2016-08-20 DIAGNOSIS — N950 Postmenopausal bleeding: Secondary | ICD-10-CM | POA: Diagnosis not present

## 2016-08-20 DIAGNOSIS — R938 Abnormal findings on diagnostic imaging of other specified body structures: Secondary | ICD-10-CM | POA: Diagnosis not present

## 2016-08-20 DIAGNOSIS — C541 Malignant neoplasm of endometrium: Secondary | ICD-10-CM | POA: Diagnosis not present

## 2016-08-28 DIAGNOSIS — C55 Malignant neoplasm of uterus, part unspecified: Secondary | ICD-10-CM | POA: Diagnosis not present

## 2016-08-30 DIAGNOSIS — M810 Age-related osteoporosis without current pathological fracture: Secondary | ICD-10-CM | POA: Diagnosis not present

## 2016-08-30 DIAGNOSIS — M8588 Other specified disorders of bone density and structure, other site: Secondary | ICD-10-CM | POA: Diagnosis not present

## 2016-09-03 ENCOUNTER — Telehealth: Payer: Self-pay | Admitting: *Deleted

## 2016-09-03 NOTE — Telephone Encounter (Signed)
I have attempted to contact the patient to set up a new patient appt. I left her a message to call the office back

## 2016-09-04 ENCOUNTER — Telehealth: Payer: Self-pay | Admitting: *Deleted

## 2016-09-04 NOTE — Telephone Encounter (Signed)
Called the patient and gave the appt time/date

## 2016-09-10 ENCOUNTER — Ambulatory Visit: Payer: Medicare HMO | Attending: Gynecologic Oncology | Admitting: Gynecologic Oncology

## 2016-09-10 ENCOUNTER — Encounter: Payer: Self-pay | Admitting: Gynecologic Oncology

## 2016-09-10 VITALS — BP 118/57 | HR 65 | Temp 98.1°F | Resp 18 | Ht 65.0 in | Wt 146.1 lb

## 2016-09-10 DIAGNOSIS — N950 Postmenopausal bleeding: Secondary | ICD-10-CM | POA: Insufficient documentation

## 2016-09-10 DIAGNOSIS — E119 Type 2 diabetes mellitus without complications: Secondary | ICD-10-CM | POA: Insufficient documentation

## 2016-09-10 DIAGNOSIS — C541 Malignant neoplasm of endometrium: Secondary | ICD-10-CM | POA: Insufficient documentation

## 2016-09-10 DIAGNOSIS — K219 Gastro-esophageal reflux disease without esophagitis: Secondary | ICD-10-CM | POA: Insufficient documentation

## 2016-09-10 DIAGNOSIS — Z7902 Long term (current) use of antithrombotics/antiplatelets: Secondary | ICD-10-CM | POA: Insufficient documentation

## 2016-09-10 DIAGNOSIS — Z86718 Personal history of other venous thrombosis and embolism: Secondary | ICD-10-CM | POA: Diagnosis not present

## 2016-09-10 DIAGNOSIS — Z8500 Personal history of malignant neoplasm of unspecified digestive organ: Secondary | ICD-10-CM | POA: Insufficient documentation

## 2016-09-10 DIAGNOSIS — Z90411 Acquired partial absence of pancreas: Secondary | ICD-10-CM | POA: Insufficient documentation

## 2016-09-10 DIAGNOSIS — M4802 Spinal stenosis, cervical region: Secondary | ICD-10-CM | POA: Insufficient documentation

## 2016-09-10 DIAGNOSIS — I2510 Atherosclerotic heart disease of native coronary artery without angina pectoris: Secondary | ICD-10-CM | POA: Insufficient documentation

## 2016-09-10 DIAGNOSIS — Z9889 Other specified postprocedural states: Secondary | ICD-10-CM | POA: Diagnosis not present

## 2016-09-10 DIAGNOSIS — Z7982 Long term (current) use of aspirin: Secondary | ICD-10-CM | POA: Diagnosis not present

## 2016-09-10 DIAGNOSIS — Z9851 Tubal ligation status: Secondary | ICD-10-CM | POA: Insufficient documentation

## 2016-09-10 NOTE — Progress Notes (Signed)
Consult Note: Gyn-Onc  Consult was requested by Dr. Landry Mellow for the evaluation of [name] 76 y.o. female  CC:  Chief Complaint  Patient presents with  . Endometrial Adenocarcinoma    Assessment/Plan:  [name]  is a 76 y.o.  year old with grade 1 endometrioid endometrial cancer in the setting of a history of a Whipple procedure for duodenal GIST tumor in 2012 and a history of a postoperative DVT in 2012.  A detailed discussion was held with the patient and her family with regard to to her endometrial cancer diagnosis. We discussed the standard management options for uterine cancer which includes surgery followed possibly by adjuvant therapy depending on the results of surgery. The options for surgical management include a hysterectomy and removal of the tubes and ovaries possibly with removal of pelvic and para-aortic lymph nodes.If feasible, a minimally invasive approach including a robotic hysterectomy or laparoscopic hysterectomy have benefits including shorter hospital stay, recovery time and better wound healing than with open surgery. The patient has been counseled about these surgical options and the risks of surgery in general including infection, bleeding, damage to surrounding structures (including bowel, bladder, ureters, nerves or vessels), and the postoperative risks of PE/ DVT, and lymphedema. I extensively reviewed the additional risks of robotic hysterectomy including possible need for conversion to open laparotomy.  I discussed positioning during surgery of trendelenberg and risks of minor facial swelling and care we take in preoperative positioning.  After counseling and consideration of her options, she desires to proceed with robotic assisted total hysterectomy with bilateral sapingo-oophorectomy and SLN biopsy.   I explained that her prior whipple procedure and postop pancreatic leak puts her at increased likelihood for having significant abdominal  adhesive disease. As such, it may become necessary for [place] to convert to laparotomy to safely accomplish the surgery. I have scheduled additional time to factor this. I discussed that this also places her at increased risk for surgical complications. I discussed that due to her prior history of DVT, I am recommending postoperative Lovenox x 2 weeks (if laparoscopy, if laparotomy I will prescribe 4 weeks) to reduce the risk for postoperative VTE of which she is at greater risk.  She will be seen by anesthesia for preoperative clearance and discussion of postoperative pain management.  She was given the opportunity to ask questions, which were answered to her satisfaction, and she is agreement with the above mentioned plan of care.  As the patient has commitments on the last week of April and first week of May we have scheduled the surgery for 10/02/16 to accommodate her schedule.   HPI: [name] is a 76 year old woman who is seen in consultation at the request of Dr Landry Mellow for grade 1 endometrioid endometrial adenocarcinoma. The patient reports experiencing postmenopausal bleeding since December, 2017. She saw Dr Landry Mellow and an [place] was performed on 08/14/16 and showed a uterus measuring 6.2x3.8x4.6cm with a thickened endometrium at 1.5cm. The ovaries were normal.  An endometrial biopsy was initially attempted but unsuccessful due to cervical stenosis. Repeat procedure with cytotec facilitated sampling in the office on 08/20/16 which showed grade 1 endometrial adenocarcinoma of the endometrium.  The patient's past medical history is significant for a duodenal GIST tumor diagnosed in 2012. For this she underwent a laparotomy and Whipple procedure with Dr Barry Dienes. Postoperatively her course was complicated by a pancreatic leak requiring a drain placement. She also developed a postop DVT in the right lower extremity. This  was treated with lovenox for 6 months. She is now on ASA 81 mg daily. She has been tested for  familial hypercoagulopathy and is negative.  She has a personal hx of CAD (50% LAD occlusion), no stenting requiring in 2009. She was treated conservatively with plavix and ASA but taken off these when she experienced GI bleeding in 2009 (however, it appears this was due to her occult duodenal tumor). Her last stress test was within the year and was normal. She sees a cardiologist at [place].   She has had approximately 2 bouts of acute pancreatitis per year since her Whipple, the most serious was in February, 2018 for which she was hospitalized for 2 days. 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  Current Meds:  Outpatient Encounter Prescriptions as of 09/10/2016  Medication Sig  . aspirin EC 81 MG tablet Take 81 mg by mouth daily.  . cholecalciferol (VITAMIN D) 1000 UNITS tablet Take 1,000 Units by mouth daily.   . Coenzyme Q10 (CO Q 10 PO) Take 1 tablet by mouth daily.  Marland Kitchen docusate sodium (COLACE) 50 MG capsule Take 1 capsule (50 mg total) by mouth 2 (two) times daily. Hold for diarrhea  . lipase/protease/amylase 24000-76000 units CPEP Take 1 capsule (24,000 Units total) by mouth 3 (three) times daily before meals.  . Melatonin 10 MG TABS Take 10 mg by mouth at bedtime.  . metoprolol succinate (TOPROL-XL) 100 MG 24 hr tablet Take 50-100 mg by mouth daily. Take '100mg'$  by mouth in the morning and '50mg'$  by mouth at night.  . pantoprazole (PROTONIX) 40 MG tablet Take 40 mg by mouth daily.    . simvastatin (ZOCOR) 20 MG tablet Take 20 mg by mouth daily.   . sucralfate (CARAFATE) 1 g tablet Take 1 g by mouth 2 (two) times daily.  . ondansetron (ZOFRAN ODT) 8 MG disintegrating tablet Take 1 tablet (8 mg total) by mouth every 8 (eight) hours as needed for nausea or vomiting. (Patient not taking: Reported on 09/10/2016)  . oxyCODONE (OXY IR/ROXICODONE) 5 MG immediate release tablet Take 1-2 tablets (5-10 mg total)  by mouth every 6 (six) hours as needed for severe pain. (Patient not taking: Reported on 09/10/2016)  . polyethylene glycol (MIRALAX / GLYCOLAX) packet Take 17 g by mouth daily as needed for mild constipation. (Patient not taking: Reported on 09/10/2016)   No facility-administered encounter medications on file as of 09/10/2016.     Allergy: No Known Allergies  Social Hx:   Social History   Social History  . Marital status: Married    Spouse name: N/A  . Number of children: N/A  . Years of education: N/A   Occupational History  . Not on file.   Social History Main Topics  . Smoking status: Never Smoker  . Smokeless tobacco: Never Used  . Alcohol use No  . Drug use: No  . Sexual activity: Not on file   Other Topics Concern  . Not on file   Social History Narrative   ** Merged History Encounter **        Past Surgical Hx:  Past Surgical History:  Procedure Laterality Date  . BREAST SURGERY    . PANCREATICODUODENECTOMY  06/27/2010  . TUBAL LIGATION      Past Medical Hx:  Past Medical History:  Diagnosis Date  . Cancer ([place])   . Diabetes mellitus without complication ([place])  pre-diabetic  . DVT of lower extremity (deep venous thrombosis) [facility]) March 2012  . GIST (gastrointestinal stromal tumor), malignant ([facility])    T2N0  . Reflux   . Ulcer     Past Gynecological History:  SVD x 1 No LMP recorded. Patient is postmenopausal.  Family Hx: History reviewed. No pertinent family history.  Review of Systems:  Constitutional  Feels well,    ENT Normal appearing ears and nares bilaterally Skin/Breast  No rash, sores, jaundice, itching, dryness Cardiovascular  No chest pain, shortness of breath, or edema  Pulmonary  No cough or wheeze.  Gastro Intestinal  No nausea, vomitting, or diarrhoea. No bright red blood per rectum, change in bowel movement, or constipation. + intermittent epigastric pain with bouts of pancreatitis Genito Urinary  No frequency, urgency,  dysuria, + postmenopausal bleeding Musculo Skeletal  No myalgia, arthralgia, joint swelling or pain  Neurologic  No weakness, numbness, change in gait,  Psychology  No depression, anxiety, insomnia.   Vitals:  Blood pressure (!) 118/57, pulse 65, temperature 98.1 F (36.7 C), temperature source Oral, resp. rate 18, height '5\' 5"'$  (1.651 m), weight 146 lb 1.6 oz (66.3 kg).  Physical Exam: WD in NAD Neck  Supple NROM, without any enlargements.  Lymph Node Survey No cervical supraclavicular or inguinal adenopathy Cardiovascular  Pulse normal rate, regularity and rhythm. S1 and S2 normal.  Lungs  Clear to auscultation bilateraly, without wheezes/crackles/rhonchi. Good air movement.  Skin  No rash/lesions/breakdown  Psychiatry  Alert and oriented to person, place, and time  Abdomen  Normoactive bowel sounds, abdomen soft, non-tender and nonobese without evidence of hernia. Large scar in upper abdomen and across right mid abdomen - well healed. Back No CVA tenderness Genito Urinary  Vulva/vagina: Normal external female genitalia.   No lesions. No discharge or bleeding.  Bladder/urethra:  No lesions or masses, well supported bladder  Vagina: normal  Cervix: Normal appearing, no lesions.  Uterus:  Small, mobile, no parametrial involvement or nodularity.  Adnexa: no palpable masses. Rectal  deferred Extremities  No bilateral cyanosis, clubbing or edema.   Donaciano Eva, MD  09/10/2016, 11:58 AM

## 2016-09-10 NOTE — Patient Instructions (Signed)
Preparing for your Surgery  Plan for surgery on May 8th 2018 with Dr. Everitt Amber.  Pre-operative Testing -You will receive a phone call from presurgical testing at [facility] to arrange for a pre-operative testing appointment before your surgery.  This appointment normally occurs one to two weeks before your scheduled surgery.   -Bring your insurance card, copy of an advanced directive if applicable, medication list  -At that visit, you will be asked to sign a consent for a possible blood transfusion in case a transfusion becomes necessary during surgery.  The need for a blood transfusion is rare but having consent is a necessary part of your care.     -You should not be taking blood thinners or aspirin at least ten days prior to surgery unless instructed by your surgeon.  Day Before Surgery at [place] will be asked to take in a light diet the day before surgery.  Avoid carbonated beverages.  You will be advised to have nothing to eat or drink after midnight the evening before.     Eat a light diet the day before surgery.  Examples including soups, broths,  toast, yogurt, mashed potatoes.  Things to avoid include carbonated beverages  (fizzy beverages), raw fruits and raw vegetables, or beans.    If your bowels are filled with gas, your surgeon will have difficulty  visualizing your pelvic organs which increases your surgical risks.  Your role in recovery Your role is to become active as soon as directed by your doctor, while still giving yourself time to heal.  Rest when you feel tired. You will be asked to do the following in order to speed your recovery:  - Cough and breathe deeply. This helps toclear and expand your lungs and can prevent pneumonia. You may be given a spirometer to practice deep breathing. A staff member will show you how to use the spirometer. - Do mild physical activity. Walking or moving your legs help your circulation and body functions return  to normal. A staff member will help you when you try to walk and will provide you with simple exercises. Do not try to get up or walk alone the first time. - Actively manage your pain. Managing your pain lets you move in comfort. We will ask you to rate your pain on a scale of zero to 10. It is your responsibility to tell your doctor or nurse where and how much you hurt so your pain can be treated.  Special Considerations -If you are diabetic, you may be placed on insulin after surgery to have closer control over your blood sugars to promote healing and recovery.  This does not mean that you will be discharged on insulin.  If applicable, your oral antidiabetics will be resumed when you are tolerating a solid diet.  -Your final pathology results from surgery should be available by the Friday after surgery and the results will be relayed to you when available.  Eat a light diet the day before surgery.  Examples including soups, broths, toast, yogurt, mashed potatoes.  Things to avoid include carbonated beverages (fizzy beverages), raw fruits and raw vegetables, or beans.   If your bowels are filled with gas, your surgeon will have difficulty visualizing your pelvic organs which increases your surgical risks. Blood Transfusion Information WHAT IS A BLOOD TRANSFUSION? A transfusion is the replacement of blood or some of its parts. Blood is made up of multiple cells which provide different functions.  Red blood cells  carry oxygen and are used for blood loss replacement.  White blood cells fight against infection.  Platelets control bleeding.  Plasma helps clot blood.  Other blood products are available for specialized needs, such as hemophilia or other clotting disorders. BEFORE THE TRANSFUSION  Who gives blood for transfusions?   You may be able to donate blood to be used at a later date on yourself (autologous donation).  Relatives can be asked to donate blood. This is generally not any  safer than if you have received blood from a stranger. The same precautions are taken to ensure safety when a relative's blood is donated.  Healthy volunteers who are fully evaluated to make sure their blood is safe. This is blood bank blood. Transfusion therapy is the safest it has ever been in the practice of medicine. Before blood is taken from a donor, a complete history is taken to make sure that person has no history of diseases nor engages in risky social behavior (examples are intravenous drug use or sexual activity with multiple partners). The donor's travel history is screened to minimize risk of transmitting infections, such as malaria. The donated blood is tested for signs of infectious diseases, such as HIV and hepatitis. The blood is then tested to be sure it is compatible with you in order to minimize the chance of a transfusion reaction. If you or a relative donates blood, this is often done in anticipation of surgery and is not appropriate for emergency situations. It takes many days to process the donated blood. RISKS AND COMPLICATIONS Although transfusion therapy is very safe and saves many lives, the main dangers of transfusion include:   Getting an infectious disease.  Developing a transfusion reaction. This is an allergic reaction to something in the blood you were given. Every precaution is taken to prevent this. The decision to have a blood transfusion has been considered carefully by your caregiver before blood is given. Blood is not given unless the benefits outweigh the risks. Total Laparoscopic Hysterectomy A total laparoscopic hysterectomy is a minimally invasive surgery to remove your uterus and cervix. This surgery is performed by making several small cuts (incisions) in your abdomen. It can also be done with a thin, lighted tube (laparoscope) inserted into two small incisions in your lower abdomen. Your fallopian tubes and ovaries can be removed (bilateral  salpingo-oophorectomy) during this surgery as well.Benefits of minimally invasive surgery include:  Less pain.  Less risk of blood loss.  Less risk of infection.  Quicker return to normal activities. Tell a health care provider about:  Any allergies you have.  All medicines you are taking, including vitamins, herbs, eye drops, creams, and over-the-counter medicines.  Any problems you or family members have had with anesthetic medicines.  Any blood disorders you have.  Any surgeries you have had.  Any medical conditions you have. What are the risks? Generally, this is a safe procedure. However, as with any procedure, complications can occur. Possible complications include:  Bleeding.  Blood clots in the legs or lung.  Infection.  Injury to surrounding organs.  Problems with anesthesia.  Early menopause symptoms (hot flashes, night sweats, insomnia).  Risk of conversion to an open abdominal incision. What happens before the procedure?  Ask your health care provider about changing or stopping your regular medicines.  Do not take aspirin or blood thinners (anticoagulants) for 1 week before the surgery or as told by your health care provider.  Do not eat or drink anything for 8  hours before the surgery or as told by your health care provider.  Quit smoking if you smoke.  Arrange for a ride home after surgery and for someone to help you at home during recovery. What happens during the procedure?  You will be given antibiotic medicine.  An IV tube will be placed in your arm. You will be given medicine to make you sleep (general anesthetic).  A gas (carbon dioxide) will be used to inflate your abdomen. This will allow your surgeon to look inside your abdomen, perform your surgery, and treat any other problems found if necessary.  Three or four small incisions (often less than 1/2 inch) will be made in your abdomen. One of these incisions will be made in the area of  your belly button (navel). The laparoscope will be inserted into the incision. Your surgeon will look through the laparoscope while doing your procedure.  Other surgical instruments will be inserted through the other incisions.  Your uterus may be removed through your vagina or cut into small pieces and removed through the small incisions.  Your incisions will be closed. What happens after the procedure?  The gas will be released from inside your abdomen.  You will be taken to the recovery area where a nurse will watch and check your progress. Once you are awake, stable, and taking fluids well, without other problems, you will return to your room or be allowed to go home.  There is usually minimal discomfort following the surgery because the incisions are so small.  You will be given pain medicine while you are in the hospital and for when you go home. This information is not intended to replace advice given to you by your health care provider. Make sure you discuss any questions you have with your health care provider. Document Released: 03/11/2007 Document Revised: 10/20/2015 Document Reviewed: 12/02/2012 Elsevier Interactive Patient Education  2017 [employer] Bilateral Salpingo-Oophorectomy Bilateral salpingo-oophorectomy is the surgical removal of both fallopian tubes and both ovaries. The ovaries are small organs that produce eggs in women. The fallopian tubes transport the egg from the ovary to the womb (uterus). Usually, when this surgery is done, the uterus was previously removed. A bilateral salpingo-oophorectomy may be done to treat cancer or to reduce the risk of cancer in women who are at high risk. Removing both fallopian tubes and both ovaries will make you unable to become pregnant (sterile). It will also put you into menopause so that you will no longer have menstrual periods and may have menopausal symptoms such as hot flashes, night sweats, and mood changes. It will not  affect your sex drive. Tell a health care provider about:  Any allergies you have.  All medicines you are taking, including vitamins, herbs, eye drops, creams, and over-the-counter medicines.  Previous problems you or family members have had with anesthetic medicines.  Any blood disorders you have.  Previous surgeries you have had.  Any medical conditions you have. What are the risks? Generally, this is a safe procedure. However, as with any procedure, complications can occur. Possible complications include:  Injury to surrounding organs.  Bleeding.  Infection.  Blood clots in the legs or lungs.  Problems related to anesthesia. What happens before the procedure?  Ask your health care provider about changing or stopping your regular medicines. You may need to stop taking certain medicines, such as aspirin or blood thinners, at least 1 week before the surgery.  Do not eat or drink anything for at  least 8 hours before the surgery.  If you smoke, do not smoke for at least 2 weeks before the surgery.  Make plans to have someone drive you home after the procedure or after your hospital stay. Also arrange for someone to help you with activities during recovery. What happens during the procedure?  You will be given medicine to help you relax before the procedure (sedative). You will then be given medicine to make you sleep through the procedure (general anesthetic). These medicines will be given through an IV access tube that is put into one of your veins.  Once you are asleep, your lower abdomen will be shaved and cleaned. A thin, flexible tube (catheter) will be placed in your bladder.  The surgeon may use a laparoscopic, robotic, or open technique for this surgery:  In the laparoscopic technique, the surgery is done through two small cuts (incisions) in the abdomen. A thin, lighted tube with a tiny camera on the end (laparoscope) is inserted into one of the incisions. The tools  needed for the procedure are put through the other incision.  A robotic technique may be chosen to perform complex surgery in a small space. In the robotic technique, small incisions will be made. A camera and surgical instruments are passed through the incisions. Surgical instruments will be controlled with the help of a robotic arm.  In the open technique, the surgery is done through one large incision in the abdomen.  Using any of these techniques, the surgeon removes the fallopian tubes and ovaries. The blood vessels will be clamped and tied.  The surgeon then uses staples or stitches to close the incision or incisions. What happens after the procedure?  You will be taken to a recovery area where you will be monitored for 1 to 3 hours. Your blood pressure, pulse, and temperature will be checked often. You will remain in the recovery area until you are stable and waking up.  If the laparoscopic technique was used, you may be allowed to go home after several hours. You may have some shoulder pain after the laparoscopic procedure. This is normal and usually goes away in a day or two.  If the open technique was used, you will be admitted to the hospital for a couple of days.  You will be given pain medicine as needed.  The IV access tube and catheter will be removed before you are discharged. This information is not intended to replace advice given to you by your health care provider. Make sure you discuss any questions you have with your health care provider. Document Released: 05/14/2005 Document Revised: 04/13/2016 Document Reviewed: 11/05/2012 Elsevier Interactive Patient Education  2017 [employer].

## 2016-09-17 ENCOUNTER — Ambulatory Visit: Payer: Medicare HMO | Admitting: Gynecologic Oncology

## 2016-09-18 DIAGNOSIS — I2510 Atherosclerotic heart disease of native coronary artery without angina pectoris: Secondary | ICD-10-CM | POA: Diagnosis not present

## 2016-09-18 DIAGNOSIS — Z9049 Acquired absence of other specified parts of digestive tract: Secondary | ICD-10-CM | POA: Diagnosis not present

## 2016-09-18 DIAGNOSIS — Z79899 Other long term (current) drug therapy: Secondary | ICD-10-CM | POA: Diagnosis not present

## 2016-09-18 DIAGNOSIS — K219 Gastro-esophageal reflux disease without esophagitis: Secondary | ICD-10-CM | POA: Diagnosis not present

## 2016-09-18 DIAGNOSIS — Z9889 Other specified postprocedural states: Secondary | ICD-10-CM | POA: Diagnosis not present

## 2016-09-18 DIAGNOSIS — E119 Type 2 diabetes mellitus without complications: Secondary | ICD-10-CM | POA: Diagnosis not present

## 2016-09-18 DIAGNOSIS — K8689 Other specified diseases of pancreas: Secondary | ICD-10-CM | POA: Diagnosis not present

## 2016-09-18 DIAGNOSIS — R1013 Epigastric pain: Secondary | ICD-10-CM | POA: Diagnosis not present

## 2016-09-18 DIAGNOSIS — E7800 Pure hypercholesterolemia, unspecified: Secondary | ICD-10-CM | POA: Diagnosis not present

## 2016-09-19 DIAGNOSIS — K219 Gastro-esophageal reflux disease without esophagitis: Secondary | ICD-10-CM | POA: Diagnosis not present

## 2016-09-19 DIAGNOSIS — E119 Type 2 diabetes mellitus without complications: Secondary | ICD-10-CM | POA: Diagnosis not present

## 2016-09-19 DIAGNOSIS — I2510 Atherosclerotic heart disease of native coronary artery without angina pectoris: Secondary | ICD-10-CM | POA: Diagnosis not present

## 2016-09-19 DIAGNOSIS — Z86718 Personal history of other venous thrombosis and embolism: Secondary | ICD-10-CM | POA: Diagnosis not present

## 2016-09-19 DIAGNOSIS — R739 Hyperglycemia, unspecified: Secondary | ICD-10-CM | POA: Diagnosis not present

## 2016-09-19 DIAGNOSIS — N952 Postmenopausal atrophic vaginitis: Secondary | ICD-10-CM | POA: Diagnosis not present

## 2016-09-19 DIAGNOSIS — Z1389 Encounter for screening for other disorder: Secondary | ICD-10-CM | POA: Diagnosis not present

## 2016-09-19 DIAGNOSIS — Z0000 Encounter for general adult medical examination without abnormal findings: Secondary | ICD-10-CM | POA: Diagnosis not present

## 2016-09-19 DIAGNOSIS — M810 Age-related osteoporosis without current pathological fracture: Secondary | ICD-10-CM | POA: Diagnosis not present

## 2016-09-19 DIAGNOSIS — R7302 Impaired glucose tolerance (oral): Secondary | ICD-10-CM | POA: Diagnosis not present

## 2016-09-19 DIAGNOSIS — Z86711 Personal history of pulmonary embolism: Secondary | ICD-10-CM | POA: Diagnosis not present

## 2016-09-20 ENCOUNTER — Telehealth: Payer: Self-pay

## 2016-09-20 DIAGNOSIS — Z86718 Personal history of other venous thrombosis and embolism: Secondary | ICD-10-CM | POA: Diagnosis not present

## 2016-09-20 DIAGNOSIS — E1165 Type 2 diabetes mellitus with hyperglycemia: Secondary | ICD-10-CM | POA: Diagnosis not present

## 2016-09-20 DIAGNOSIS — M810 Age-related osteoporosis without current pathological fracture: Secondary | ICD-10-CM | POA: Diagnosis not present

## 2016-09-20 DIAGNOSIS — I2510 Atherosclerotic heart disease of native coronary artery without angina pectoris: Secondary | ICD-10-CM | POA: Diagnosis not present

## 2016-09-20 NOTE — Telephone Encounter (Signed)
Ms Casas called to inform Dr. Denman George that she was just diagnosed with diabetes. Told her to bring any medications for diabetes  to preop testing appointment on May 4,2018.  Notified Shame[place]ia  in pre-op testing of new diagnosis.

## 2016-09-27 NOTE — Patient Instructions (Addendum)
[name]  09/27/2016   Your procedure is scheduled on: 10-02-16  Report to [facility] Main  Entrance Take [place]  elevators to 3rd floor to  [place] at [phone].   Call this number if you have problems the morning of surgery [phone]    Remember: ONLY 1 PERSON MAY GO WITH YOU TO SHORT STAY TO GET  READY MORNING OF [place].  Do not eat food or drink liquids :After Midnight.     Take these medicines the morning of surgery with A SIP OF WATER: metoprolol, pantoprazole(protonix), sucralfate(carafate), oxycodone as needed                                 You may not have any metal on your body including hair pins and              piercings  Do not wear jewelry, make-up, lotions, powders or perfumes, deodorant             Do not wear nail polish.  Do not shave  48 hours prior to surgery.     Do not bring valuables to the hospital. [place].  Contacts, dentures or bridgework may not be worn into surgery.  Leave suitcase in the car. After surgery it may be brought to your room.                 Please read over the following fact sheets you were given: _____________________________________________________________________             Eat a light diet the day before surgery.  Examples including soups, broths, toast, yogurt, mashed potatoes.  Things to avoid include carbonated beverages (fizzy beverages), raw fruits and raw vegetables, or beans.   If your bowels are filled with gas, your surgeon will have difficulty visualizing your pelvic organs which increases your surgical risks.  How to Manage Your Diabetes Before and After Surgery  Why is it important to control my blood sugar before and after surgery? . Improving blood sugar levels before and after surgery helps healing and can limit problems. . A way of improving blood sugar control is eating a healthy diet by: o  Eating less sugar  and carbohydrates o  Increasing activity/exercise o  Talking with your doctor about reaching your blood sugar goals . High blood sugars (greater than 180 mg/dL) can raise your risk of infections and slow your recovery, so you will need to focus on controlling your diabetes during the weeks before surgery. . Make sure that the doctor who takes care of your diabetes knows about your planned surgery including the date and location.  How do I manage my blood sugar before surgery? . Check your blood sugar at least 4 times a day, starting 2 days before surgery, to make sure that the level is not too high or low. o Check your blood sugar the morning of your surgery when you wake up and every 2 hours until you get to the Short Stay unit. . If your blood sugar is less than 70 mg/dL, you will need to treat for low blood sugar: o Do not take insulin. o Treat a low blood sugar (less than 70 mg/dL) with  cup of clear juice (cranberry or apple), 4 glucose tablets, OR glucose gel. o Recheck blood sugar in 15 minutes after treatment (to make sure it is greater than 70 mg/dL). If your blood sugar is not greater than 70 mg/dL on recheck, call [phone] for further instructions. . Report your blood sugar to the short stay nurse when you get to Short Stay.  . If you are admitted to the hospital after surgery: o Your blood sugar will be checked by the staff and you will probably be given insulin after surgery (instead of oral diabetes medicines) to make sure you have good blood sugar levels. o The goal for blood sugar control after surgery is 80-180 mg/dL.   WHAT DO I DO ABOUT MY DIABETES MEDICATION?    . THE NIGHT BEFORE SURGERY 10-01-16,    take   5  units of   LANTUS    insulin.       Take breakfast and lunch dose of NOVOLOG FLEX PEN insulin as needed; do not take EVENING dose   . THE MORNING OF SURGERY 10-02-16,    do not take any Lantus insulin    Only take NOVOLOG FLEX PEN if your blood sugar is  greater than 220; if so take 1/2 your normal dose    Patient Signature:  Date:   Nurse Signature:  Date:   Reviewed and Endorsed by [place] Patient Education Committee, August 2015  [facility] - Preparing for Surgery Before surgery, you can play an important role.  Because skin is not sterile, your skin needs to be as free of germs as possible.  You can reduce the number of germs on your skin by washing with CHG (chlorahexidine gluconate) soap before surgery.  CHG is an antiseptic cleaner which kills germs and bonds with the skin to continue killing germs even after washing. Please DO NOT use if you have an allergy to CHG or antibacterial soaps.  If your skin becomes reddened/irritated stop using the CHG and inform your nurse when you arrive at Short Stay. Do not shave (including legs and underarms) for at least 48 hours prior to the first CHG shower.  You may shave your face/neck. Please follow these instructions carefully:  1.  Shower with CHG Soap the night before surgery and the  morning of Surgery.  2.  If you choose to wash your hair, wash your hair first as usual with your  normal  shampoo.  3.  After you shampoo, rinse your hair and body thoroughly to remove the  shampoo.                           4.  Use CHG as you would any other liquid soap.  You can apply chg directly  to the skin and wash                       Gently with a scrungie or clean washcloth.  5.  Apply the CHG Soap to your body ONLY FROM THE NECK DOWN.   Do not use on face/ open                           Wound or open sores. Avoid contact with eyes, ears mouth and genitals (private parts).  Wash face,  Genitals (private parts) with your normal soap.             6.  Wash thoroughly, paying special attention to the area where your surgery  will be performed.  7.  Thoroughly rinse your body with warm water from the neck down.  8.  DO NOT shower/wash with your normal soap after using and rinsing  off  the CHG Soap.                9.  Pat yourself dry with a clean towel.            10.  Wear clean pajamas.            11.  Place clean sheets on your bed the night of your first shower and do not  sleep with pets. Day of Surgery : Do not apply any lotions/deodorants the morning of surgery.  Please wear clean clothes to the hospital/surgery center.  FAILURE TO FOLLOW THESE INSTRUCTIONS MAY RESULT IN THE CANCELLATION OF YOUR SURGERY PATIENT SIGNATURE_________________________________  NURSE SIGNATURE__________________________________  ________________________________________________________________________   [name]  An incentive spirometer is a tool that can help keep your lungs clear and active. This tool measures how well you are filling your lungs with each breath. Taking long deep breaths may help reverse or decrease the chance of developing breathing (pulmonary) problems (especially infection) following:  A long period of time when you are unable to move or be active. BEFORE THE PROCEDURE   If the spirometer includes an indicator to show your best effort, your nurse or respiratory therapist will set it to a desired goal.  If possible, sit up straight or lean slightly forward. Try not to slouch.  Hold the incentive spirometer in an upright position. INSTRUCTIONS FOR USE  1. Sit on the edge of your bed if possible, or sit up as far as you can in bed or on a chair. 2. Hold the incentive spirometer in an upright position. 3. Breathe out normally. 4. Place the mouthpiece in your mouth and seal your lips tightly around it. 5. Breathe in slowly and as deeply as possible, raising the piston or the ball toward the top of the column. 6. Hold your breath for 3-5 seconds or for as long as possible. Allow the piston or ball to fall to the bottom of the column. 7. Remove the mouthpiece from your mouth and breathe out normally. 8. Rest for a few seconds and repeat Steps 1  through 7 at least 10 times every 1-2 hours when you are awake. Take your time and take a few normal breaths between deep breaths. 9. The spirometer may include an indicator to show your best effort. Use the indicator as a goal to work toward during each repetition. 10. After each set of 10 deep breaths, practice coughing to be sure your lungs are clear. If you have an incision (the cut made at the time of surgery), support your incision when coughing by placing a pillow or rolled up towels firmly against it. Once you are able to get out of bed, walk around indoors and cough well. You may stop using the incentive spirometer when instructed by your caregiver.  RISKS AND COMPLICATIONS  Take your time so you do not get dizzy or light-headed.  If you are in pain, you may need to take or ask for pain medication before doing incentive spirometry. It is harder to take a deep breath if you are having  pain. AFTER USE  Rest and breathe slowly and easily.  It can be helpful to keep track of a log of your progress. Your caregiver can provide you with a simple table to help with this. If you are using the spirometer at home, follow these instructions: [place] IF:   You are having difficultly using the spirometer.  You have trouble using the spirometer as often as instructed.  Your pain medication is not giving enough relief while using the spirometer.  You develop fever of 100.5 F (38.1 C) or higher. SEEK IMMEDIATE MEDICAL CARE IF:   You cough up bloody sputum that had not been present before.  You develop fever of 102 F (38.9 C) or greater.  You develop worsening pain at or near the incision site. MAKE SURE YOU:   Understand these instructions.  Will watch your condition.  Will get help right away if you are not doing well or get worse. Document Released: 09/24/2006 Document Revised: 08/06/2011 Document Reviewed: 11/25/2006 ExitCare Patient Information 2014 ExitCare,  [place].   ________________________________________________________________________  WHAT IS A BLOOD TRANSFUSION? Blood Transfusion Information  A transfusion is the replacement of blood or some of its parts. Blood is made up of multiple cells which provide different functions.  Red blood cells carry oxygen and are used for blood loss replacement.  White blood cells fight against infection.  Platelets control bleeding.  Plasma helps clot blood.  Other blood products are available for specialized needs, such as hemophilia or other clotting disorders. BEFORE THE TRANSFUSION  Who gives blood for transfusions?   Healthy volunteers who are fully evaluated to make sure their blood is safe. This is blood bank blood. Transfusion therapy is the safest it has ever been in the practice of medicine. Before blood is taken from a donor, a complete history is taken to make sure that person has no history of diseases nor engages in risky social behavior (examples are intravenous drug use or sexual activity with multiple partners). The donor's travel history is screened to minimize risk of transmitting infections, such as malaria. The donated blood is tested for signs of infectious diseases, such as HIV and hepatitis. The blood is then tested to be sure it is compatible with you in order to minimize the chance of a transfusion reaction. If you or a relative donates blood, this is often done in anticipation of surgery and is not appropriate for emergency situations. It takes many days to process the donated blood. RISKS AND COMPLICATIONS Although transfusion therapy is very safe and saves many lives, the main dangers of transfusion include:   Getting an infectious disease.  Developing a transfusion reaction. This is an allergic reaction to something in the blood you were given. Every precaution is taken to prevent this. The decision to have a blood transfusion has been considered carefully by your caregiver  before blood is given. Blood is not given unless the benefits outweigh the risks. AFTER THE TRANSFUSION  Right after receiving a blood transfusion, you will usually feel much better and more energetic. This is especially true if your red blood cells have gotten low (anemic). The transfusion raises the level of the red blood cells which carry oxygen, and this usually causes an energy increase.  The nurse administering the transfusion will monitor you carefully for complications. HOME CARE INSTRUCTIONS  No special instructions are needed after a transfusion. You may find your energy is better. Speak with your caregiver about any limitations on activity for underlying  diseases you may have. SEEK MEDICAL CARE IF:   Your condition is not improving after your transfusion.  You develop redness or irritation at the intravenous (IV) site. SEEK IMMEDIATE MEDICAL CARE IF:  Any of the following symptoms occur over the next 12 hours:  Shaking chills.  You have a temperature by mouth above 102 F (38.9 C), not controlled by medicine.  Chest, back, or muscle pain.  People around you feel you are not acting correctly or are confused.  Shortness of breath or difficulty breathing.  Dizziness and fainting.  You get a rash or develop hives.  You have a decrease in urine output.  Your urine turns a dark color or changes to pink, red, or brown. Any of the following symptoms occur over the next 10 days:  You have a temperature by mouth above 102 F (38.9 C), not controlled by medicine.  Shortness of breath.  Weakness after normal activity.  The white part of the eye turns yellow (jaundice).  You have a decrease in the amount of urine or are urinating less often.  Your urine turns a dark color or changes to pink, red, or brown. Document Released: 05/11/2000 Document Revised: 08/06/2011 Document Reviewed: 12/29/2007 [facility] Patient Information 2014 [place],  [place].  _______________________________________________________________________

## 2016-09-27 NOTE — Progress Notes (Signed)
lov cardiology dr berry 05-14-16 epic Stress test 05-16-16 epic ekg 05-14-16 epic

## 2016-09-28 ENCOUNTER — Ambulatory Visit (HOSPITAL_COMMUNITY)
Admission: RE | Admit: 2016-09-28 | Discharge: 2016-09-28 | Disposition: A | Discharge status: Home / Self Care | Payer: Medicare HMO | Source: Ambulatory Visit | Attending: Gynecologic Oncology | Admitting: Gynecologic Oncology

## 2016-09-28 ENCOUNTER — Encounter (HOSPITAL_COMMUNITY): Payer: Self-pay

## 2016-09-28 ENCOUNTER — Encounter (HOSPITAL_COMMUNITY)
Admission: RE | Admit: 2016-09-28 | Discharge: 2016-09-28 | Disposition: A | Discharge status: Home / Self Care | Payer: Medicare HMO | Source: Ambulatory Visit | Attending: Gynecologic Oncology | Admitting: Gynecologic Oncology

## 2016-09-28 DIAGNOSIS — Z01818 Encounter for other preprocedural examination: Secondary | ICD-10-CM

## 2016-09-28 DIAGNOSIS — C801 Malignant (primary) neoplasm, unspecified: Secondary | ICD-10-CM | POA: Diagnosis not present

## 2016-09-28 DIAGNOSIS — Z01812 Encounter for preprocedural laboratory examination: Secondary | ICD-10-CM | POA: Diagnosis not present

## 2016-09-28 DIAGNOSIS — Z90710 Acquired absence of both cervix and uterus: Secondary | ICD-10-CM | POA: Diagnosis not present

## 2016-09-28 LAB — CBC WITH DIFFERENTIAL/PLATELET
Basophils Absolute: 0 10*3/uL (ref 0.0–0.1)
Basophils Relative: 0 %
Eosinophils Absolute: 0.2 10*3/uL (ref 0.0–0.7)
Eosinophils Relative: 3 %
HCT: 40.7 % (ref 36.0–46.0)
Hemoglobin: 13.5 g/dL (ref 12.0–15.0)
Lymphocytes Relative: 35 %
Lymphs Abs: 2.3 10*3/uL (ref 0.7–4.0)
MCH: 29.8 pg (ref 26.0–34.0)
MCHC: 33.2 g/dL (ref 30.0–36.0)
MCV: 89.8 fL (ref 78.0–100.0)
Monocytes Absolute: 0.4 10*3/uL (ref 0.1–1.0)
Monocytes Relative: 6 %
Neutro Abs: 3.6 10*3/uL (ref 1.7–7.7)
Neutrophils Relative %: 56 %
Platelets: 198 10*3/uL (ref 150–400)
RBC: 4.53 MIL/uL (ref 3.87–5.11)
RDW: 14.4 % (ref 11.5–15.5)
WBC: 6.6 10*3/uL (ref 4.0–10.5)

## 2016-09-28 LAB — URINALYSIS, MICROSCOPIC (REFLEX)

## 2016-09-28 LAB — URINALYSIS, ROUTINE W REFLEX MICROSCOPIC
Glucose, UA: NEGATIVE mg/dL
Nitrite: NEGATIVE
Specific Gravity, Urine: 1.02 (ref 1.005–1.030)
pH: 5.5 (ref 5.0–8.0)

## 2016-09-28 LAB — COMPREHENSIVE METABOLIC PANEL
ALT: 14 U/L (ref 14–54)
AST: 22 U/L (ref 15–41)
Albumin: 3.9 g/dL (ref 3.5–5.0)
Alkaline Phosphatase: 72 U/L (ref 38–126)
Anion gap: 7 (ref 5–15)
BUN: 15 mg/dL (ref 6–20)
CO2: 26 mmol/L (ref 22–32)
Calcium: 9.4 mg/dL (ref 8.9–10.3)
Chloride: 106 mmol/L (ref 101–111)
Creatinine, Ser: 0.7 mg/dL (ref 0.44–1.00)
GFR calc Af Amer: >60 mL/min (ref >60)
GFR calc non Af Amer: >60 mL/min (ref >60)
Glucose, Bld: 106 mg/dL — ABNORMAL HIGH (ref 65–99)
Potassium: 4.8 mmol/L (ref 3.5–5.1)
Sodium: 139 mmol/L (ref 135–145)
Total Bilirubin: 0.6 mg/dL (ref 0.3–1.2)
Total Protein: 7.3 g/dL (ref 6.5–8.1)

## 2016-09-28 LAB — ABO/RH: ABO/RH(D): O POS

## 2016-09-28 LAB — GLUCOSE, CAPILLARY: Glucose-Capillary: 103 mg/dL — ABNORMAL HIGH (ref 65–99)

## 2016-09-28 NOTE — Progress Notes (Signed)
UA results routed to [place] via epic

## 2016-09-29 LAB — HEMOGLOBIN A1C
Hgb A1c MFr Bld: 9.9 % — ABNORMAL HIGH (ref 4.8–5.6)
Mean Plasma Glucose: 237 mg/dL

## 2016-10-01 NOTE — Progress Notes (Signed)
09-28-16 HGA1C sent to Dr. Denman George for review.

## 2016-10-02 ENCOUNTER — Ambulatory Visit (HOSPITAL_COMMUNITY): Payer: Medicare HMO | Admitting: Certified Registered"

## 2016-10-02 ENCOUNTER — Telehealth: Payer: Self-pay | Admitting: *Deleted

## 2016-10-02 ENCOUNTER — Encounter (HOSPITAL_COMMUNITY)
Admission: RE | Disposition: A | Discharge status: Home / Self Care | Payer: Self-pay | Source: Ambulatory Visit | Attending: Gynecologic Oncology

## 2016-10-02 ENCOUNTER — Ambulatory Visit (HOSPITAL_COMMUNITY)
Admission: RE | Admit: 2016-10-02 | Discharge: 2016-10-03 | Disposition: A | Discharge status: Home / Self Care | Payer: Medicare HMO | Source: Ambulatory Visit | Attending: Gynecologic Oncology | Admitting: Gynecologic Oncology

## 2016-10-02 ENCOUNTER — Encounter (HOSPITAL_COMMUNITY): Payer: Self-pay | Admitting: *Deleted

## 2016-10-02 DIAGNOSIS — Z86718 Personal history of other venous thrombosis and embolism: Secondary | ICD-10-CM | POA: Insufficient documentation

## 2016-10-02 DIAGNOSIS — Z7982 Long term (current) use of aspirin: Secondary | ICD-10-CM | POA: Diagnosis not present

## 2016-10-02 DIAGNOSIS — K219 Gastro-esophageal reflux disease without esophagitis: Secondary | ICD-10-CM | POA: Diagnosis not present

## 2016-10-02 DIAGNOSIS — E119 Type 2 diabetes mellitus without complications: Secondary | ICD-10-CM | POA: Insufficient documentation

## 2016-10-02 DIAGNOSIS — I10 Essential (primary) hypertension: Secondary | ICD-10-CM | POA: Diagnosis not present

## 2016-10-02 DIAGNOSIS — N736 Female pelvic peritoneal adhesions (postinfective): Secondary | ICD-10-CM | POA: Diagnosis not present

## 2016-10-02 DIAGNOSIS — Z794 Long term (current) use of insulin: Secondary | ICD-10-CM | POA: Insufficient documentation

## 2016-10-02 DIAGNOSIS — Z792 Long term (current) use of antibiotics: Secondary | ICD-10-CM

## 2016-10-02 DIAGNOSIS — I2510 Atherosclerotic heart disease of native coronary artery without angina pectoris: Secondary | ICD-10-CM | POA: Diagnosis not present

## 2016-10-02 DIAGNOSIS — Z79899 Other long term (current) drug therapy: Secondary | ICD-10-CM | POA: Diagnosis not present

## 2016-10-02 DIAGNOSIS — Z299 Encounter for prophylactic measures, unspecified: Secondary | ICD-10-CM

## 2016-10-02 DIAGNOSIS — C541 Malignant neoplasm of endometrium: Secondary | ICD-10-CM | POA: Diagnosis present

## 2016-10-02 HISTORY — PX: ROBOTIC ASSISTED TOTAL HYSTERECTOMY WITH BILATERAL SALPINGO OOPHERECTOMY: SHX6086

## 2016-10-02 LAB — GLUCOSE, CAPILLARY
Glucose-Capillary: 159 mg/dL — ABNORMAL HIGH (ref 65–99)
Glucose-Capillary: 158 mg/dL — ABNORMAL HIGH (ref 65–99)
Glucose-Capillary: 197 mg/dL — ABNORMAL HIGH (ref 65–99)
Glucose-Capillary: 251 mg/dL — ABNORMAL HIGH (ref 65–99)

## 2016-10-02 LAB — TYPE AND SCREEN
ABO/RH(D): O POS
Antibody Screen: NEGATIVE

## 2016-10-02 SURGERY — HYSTERECTOMY, TOTAL, ROBOT-ASSISTED, LAPAROSCOPIC, WITH BILATERAL SALPINGO-OOPHORECTOMY
Anesthesia: General | Laterality: Bilateral

## 2016-10-02 MED ORDER — ENOXAPARIN SODIUM 40 MG/0.4ML ~~LOC~~ SOLN
40.0000 mg | SUBCUTANEOUS | Status: DC
  Administered 2016-10-03: 40 mg via SUBCUTANEOUS
  Filled 2016-10-02: qty 0.4

## 2016-10-02 MED ORDER — LACTATED RINGERS IV SOLN
INTRAVENOUS | Status: DC | PRN
  Administered 2016-10-02: 1000 mL

## 2016-10-02 MED ORDER — ROCURONIUM BROMIDE 10 MG/ML (PF) SYRINGE
PREFILLED_SYRINGE | INTRAVENOUS | Status: DC | PRN
  Administered 2016-10-02: 40 mg via INTRAVENOUS
  Administered 2016-10-02 (×3): 10 mg via INTRAVENOUS

## 2016-10-02 MED ORDER — CEFAZOLIN SODIUM-DEXTROSE 2-4 GM/100ML-% IV SOLN
2.0000 g | INTRAVENOUS | Status: AC
  Administered 2016-10-02: 2 g via INTRAVENOUS

## 2016-10-02 MED ORDER — STERILE WATER FOR INJECTION IJ SOLN
INTRAMUSCULAR | Status: DC | PRN
  Administered 2016-10-02: 4 mL via VAGINAL

## 2016-10-02 MED ORDER — CEFAZOLIN SODIUM-DEXTROSE 2-4 GM/100ML-% IV SOLN
INTRAVENOUS | Status: AC
  Filled 2016-10-02: qty 100

## 2016-10-02 MED ORDER — ONDANSETRON HCL 4 MG/2ML IJ SOLN
INTRAMUSCULAR | Status: DC | PRN
  Administered 2016-10-02: 4 mg via INTRAVENOUS

## 2016-10-02 MED ORDER — HYDROMORPHONE HCL 1 MG/ML IJ SOLN: 0.2500 mg | INTRAMUSCULAR | Status: DC | PRN

## 2016-10-02 MED ORDER — NITROGLYCERIN 0.4 MG SL SUBL: 0.4000 mg | SUBLINGUAL_TABLET | SUBLINGUAL | Status: DC | PRN

## 2016-10-02 MED ORDER — PROPOFOL 10 MG/ML IV BOLUS
INTRAVENOUS | Status: AC
  Filled 2016-10-02: qty 20

## 2016-10-02 MED ORDER — DEXAMETHASONE SODIUM PHOSPHATE 10 MG/ML IJ SOLN
INTRAMUSCULAR | Status: DC | PRN
  Administered 2016-10-02: 10 mg via INTRAVENOUS

## 2016-10-02 MED ORDER — INSULIN ASPART 100 UNIT/ML ~~LOC~~ SOLN
0.0000 [IU] | Freq: Three times a day (TID) | SUBCUTANEOUS | Status: DC
  Administered 2016-10-02 – 2016-10-03 (×2): 3 [IU] via SUBCUTANEOUS

## 2016-10-02 MED ORDER — SUGAMMADEX SODIUM 500 MG/5ML IV SOLN
INTRAVENOUS | Status: DC | PRN
  Administered 2016-10-02: 150 mg via INTRAVENOUS

## 2016-10-02 MED ORDER — ONDANSETRON HCL 4 MG/2ML IJ SOLN
INTRAMUSCULAR | Status: AC
  Filled 2016-10-02: qty 2

## 2016-10-02 MED ORDER — SODIUM CHLORIDE 0.9 % IJ SOLN
INTRAMUSCULAR | Status: DC | PRN
  Administered 2016-10-02: 10 mL

## 2016-10-02 MED ORDER — METOPROLOL TARTRATE 50 MG PO TABS
50.0000 mg | ORAL_TABLET | Freq: Every day | ORAL | Status: DC
  Administered 2016-10-03: 50 mg via ORAL
  Filled 2016-10-02: qty 1

## 2016-10-02 MED ORDER — SIMVASTATIN 20 MG PO TABS
20.0000 mg | ORAL_TABLET | Freq: Every evening | ORAL | Status: DC
  Administered 2016-10-02: 20 mg via ORAL
  Filled 2016-10-02: qty 1

## 2016-10-02 MED ORDER — GABAPENTIN 300 MG PO CAPS
300.0000 mg | ORAL_CAPSULE | Freq: Every day | ORAL | Status: DC
  Filled 2016-10-02: qty 1

## 2016-10-02 MED ORDER — INSULIN GLARGINE 100 UNIT/ML ~~LOC~~ SOLN
80.0000 [IU] | Freq: Every day | SUBCUTANEOUS | Status: DC
  Filled 2016-10-02: qty 0.8

## 2016-10-02 MED ORDER — ONDANSETRON HCL 4 MG PO TABS: 4.0000 mg | ORAL_TABLET | Freq: Four times a day (QID) | ORAL | Status: DC | PRN

## 2016-10-02 MED ORDER — BUPIVACAINE HCL (PF) 0.25 % IJ SOLN
INTRAMUSCULAR | Status: DC | PRN
  Administered 2016-10-02: 20 mL

## 2016-10-02 MED ORDER — BUPIVACAINE LIPOSOME 1.3 % IJ SUSP
20.0000 mL | Freq: Once | INTRAMUSCULAR | Status: AC
  Administered 2016-10-02: 20 mL
  Filled 2016-10-02: qty 20

## 2016-10-02 MED ORDER — LIDOCAINE 2% (20 MG/ML) 5 ML SYRINGE
INTRAMUSCULAR | Status: DC | PRN
  Administered 2016-10-02: 100 mg via INTRAVENOUS

## 2016-10-02 MED ORDER — HYDROMORPHONE HCL 1 MG/ML IJ SOLN
0.2000 mg | INTRAMUSCULAR | Status: DC | PRN
  Filled 2016-10-02: qty 0.5

## 2016-10-02 MED ORDER — SUGAMMADEX SODIUM 200 MG/2ML IV SOLN
INTRAVENOUS | Status: AC
  Filled 2016-10-02: qty 2

## 2016-10-02 MED ORDER — LIP MEDEX EX OINT
TOPICAL_OINTMENT | CUTANEOUS | Status: AC
  Filled 2016-10-02: qty 7

## 2016-10-02 MED ORDER — HYDROMORPHONE HCL 1 MG/ML IJ SOLN
INTRAMUSCULAR | Status: DC | PRN
  Administered 2016-10-02: 0.5 mg via INTRAVENOUS
  Administered 2016-10-02: 1 mg via INTRAVENOUS
  Administered 2016-10-02: 0.5 mg via INTRAVENOUS

## 2016-10-02 MED ORDER — SODIUM CHLORIDE 0.9 % IJ SOLN
INTRAMUSCULAR | Status: AC
  Filled 2016-10-02: qty 20

## 2016-10-02 MED ORDER — OXYCODONE-ACETAMINOPHEN 5-325 MG PO TABS: 1.0000 | ORAL_TABLET | ORAL | Status: DC | PRN

## 2016-10-02 MED ORDER — BUPIVACAINE HCL (PF) 0.25 % IJ SOLN
INTRAMUSCULAR | Status: AC
  Filled 2016-10-02: qty 30

## 2016-10-02 MED ORDER — STERILE WATER FOR IRRIGATION IR SOLN
Status: DC | PRN
  Administered 2016-10-02: 1000 mL

## 2016-10-02 MED ORDER — ASPIRIN EC 81 MG PO TBEC
81.0000 mg | DELAYED_RELEASE_TABLET | Freq: Every day | ORAL | Status: DC
  Administered 2016-10-02 – 2016-10-03 (×2): 81 mg via ORAL
  Filled 2016-10-02 (×2): qty 1

## 2016-10-02 MED ORDER — PANTOPRAZOLE SODIUM 40 MG PO TBEC
40.0000 mg | DELAYED_RELEASE_TABLET | Freq: Every day | ORAL | Status: DC
  Administered 2016-10-03: 40 mg via ORAL
  Filled 2016-10-02: qty 1

## 2016-10-02 MED ORDER — FENTANYL CITRATE (PF) 100 MCG/2ML IJ SOLN
INTRAMUSCULAR | Status: DC | PRN
  Administered 2016-10-02 (×5): 50 ug via INTRAVENOUS

## 2016-10-02 MED ORDER — KCL IN DEXTROSE-NACL 20-5-0.45 MEQ/L-%-% IV SOLN
INTRAVENOUS | Status: DC
  Administered 2016-10-02: 17:00:00 via INTRAVENOUS
  Filled 2016-10-02 (×2): qty 1000

## 2016-10-02 MED ORDER — IBUPROFEN 400 MG PO TABS: 800.0000 mg | ORAL_TABLET | Freq: Three times a day (TID) | ORAL | Status: DC | PRN

## 2016-10-02 MED ORDER — ROCURONIUM BROMIDE 50 MG/5ML IV SOSY
PREFILLED_SYRINGE | INTRAVENOUS | Status: AC
  Filled 2016-10-02: qty 10

## 2016-10-02 MED ORDER — PROPOFOL 10 MG/ML IV BOLUS
INTRAVENOUS | Status: DC | PRN
Start: 2016-10-02 — End: 2016-10-02
  Administered 2016-10-02: 160 mg via INTRAVENOUS

## 2016-10-02 MED ORDER — PROMETHAZINE HCL 25 MG/ML IJ SOLN
6.2500 mg | INTRAMUSCULAR | Status: AC | PRN
  Administered 2016-10-02 (×2): 6.25 mg via INTRAVENOUS

## 2016-10-02 MED ORDER — FENTANYL CITRATE (PF) 250 MCG/5ML IJ SOLN
INTRAMUSCULAR | Status: AC
  Filled 2016-10-02: qty 5

## 2016-10-02 MED ORDER — LIDOCAINE 2% (20 MG/ML) 5 ML SYRINGE
INTRAMUSCULAR | Status: AC
  Filled 2016-10-02: qty 5

## 2016-10-02 MED ORDER — ONDANSETRON HCL 4 MG/2ML IJ SOLN: 4.0000 mg | Freq: Four times a day (QID) | INTRAMUSCULAR | Status: DC | PRN

## 2016-10-02 MED ORDER — BUPIVACAINE LIPOSOME 1.3 % IJ SUSP
20.0000 mL | Freq: Once | INTRAMUSCULAR | Status: DC
  Filled 2016-10-02: qty 20

## 2016-10-02 MED ORDER — STERILE WATER FOR INJECTION IJ SOLN
INTRAMUSCULAR | Status: AC
  Filled 2016-10-02: qty 10

## 2016-10-02 MED ORDER — TRAMADOL HCL 50 MG PO TABS: 100.0000 mg | ORAL_TABLET | Freq: Two times a day (BID) | ORAL | Status: DC | PRN

## 2016-10-02 MED ORDER — PROMETHAZINE HCL 25 MG/ML IJ SOLN
INTRAMUSCULAR | Status: AC
  Filled 2016-10-02: qty 1

## 2016-10-02 MED ORDER — LACTATED RINGERS IV SOLN
INTRAVENOUS | Status: DC | PRN
  Administered 2016-10-02 (×2): via INTRAVENOUS

## 2016-10-02 MED ORDER — DEXAMETHASONE SODIUM PHOSPHATE 10 MG/ML IJ SOLN
INTRAMUSCULAR | Status: AC
  Filled 2016-10-02: qty 1

## 2016-10-02 MED ORDER — ENOXAPARIN SODIUM 40 MG/0.4ML ~~LOC~~ SOLN
40.0000 mg | SUBCUTANEOUS | Status: AC
  Administered 2016-10-02: 40 mg via SUBCUTANEOUS
  Filled 2016-10-02: qty 0.4

## 2016-10-02 MED ORDER — INSULIN GLARGINE 100 UNIT/ML ~~LOC~~ SOLN
8.0000 [IU] | Freq: Once | SUBCUTANEOUS | Status: AC
  Administered 2016-10-02: 8 [IU] via SUBCUTANEOUS
  Filled 2016-10-02: qty 0.08

## 2016-10-02 MED ORDER — LACTATED RINGERS IV SOLN: INTRAVENOUS | Status: DC

## 2016-10-02 MED ORDER — HYDROMORPHONE HCL 2 MG/ML IJ SOLN
INTRAMUSCULAR | Status: AC
  Filled 2016-10-02: qty 1

## 2016-10-02 MED ORDER — METOPROLOL TARTRATE 25 MG PO TABS
25.0000 mg | ORAL_TABLET | Freq: Every day | ORAL | Status: DC
  Administered 2016-10-02: 25 mg via ORAL
  Filled 2016-10-02: qty 1

## 2016-10-02 SURGICAL SUPPLY — 69 items
APPLICATOR SURGIFLO ENDO (HEMOSTASIS) IMPLANT
BAG LAPAROSCOPIC 12 15 PORT 16 (BASKET) IMPLANT
BAG RETRIEVAL 12/15 (BASKET)
CHLORAPREP W/TINT 26ML (MISCELLANEOUS) ×2 IMPLANT
COVER BACK TABLE 60X90IN (DRAPES) ×2 IMPLANT
COVER SURGICAL LIGHT HANDLE (MISCELLANEOUS) ×2 IMPLANT
COVER TIP SHEARS 8 DVNC (MISCELLANEOUS) ×2 IMPLANT
COVER TIP SHEARS 8MM DA VINCI (MISCELLANEOUS) ×2
DERMABOND ADVANCED (GAUZE/BANDAGES/DRESSINGS) ×1
DERMABOND ADVANCED .7 DNX12 (GAUZE/BANDAGES/DRESSINGS) ×1 IMPLANT
DRAPE ARM DVNC X/XI (DISPOSABLE) ×4 IMPLANT
DRAPE COLUMN DVNC XI (DISPOSABLE) ×1 IMPLANT
DRAPE DA VINCI XI ARM (DISPOSABLE) ×4
DRAPE DA VINCI XI COLUMN (DISPOSABLE) ×1
DRAPE SHEET LG 3/4 BI-LAMINATE (DRAPES) ×4 IMPLANT
DRAPE SURG IRRIG POUCH 19X23 (DRAPES) ×2 IMPLANT
ELECT REM PT RETURN 15FT ADLT (MISCELLANEOUS) ×2 IMPLANT
GAUZE VASELINE 1X8 (GAUZE/BANDAGES/DRESSINGS) ×2 IMPLANT
GLOVE BIO SURGEON STRL SZ 6 (GLOVE) ×8 IMPLANT
GLOVE BIO SURGEON STRL SZ 6.5 (GLOVE) ×4 IMPLANT
GOWN STRL REUS W/ TWL LRG LVL3 (GOWN DISPOSABLE) ×2 IMPLANT
GOWN STRL REUS W/TWL LRG LVL3 (GOWN DISPOSABLE) ×2
HOLDER FOLEY CATH W/STRAP (MISCELLANEOUS) ×2 IMPLANT
IRRIG SUCT STRYKERFLOW 2 WTIP (MISCELLANEOUS) ×2
IRRIGATION SUCT STRKRFLW 2 WTP (MISCELLANEOUS) ×1 IMPLANT
KIT BASIN OR (CUSTOM PROCEDURE TRAY) ×2 IMPLANT
KIT PROCEDURE DA VINCI SI (MISCELLANEOUS) ×1
KIT PROCEDURE DVNC SI (MISCELLANEOUS) ×1 IMPLANT
MANIPULATOR UTERINE 4.5 ZUMI (MISCELLANEOUS) ×2 IMPLANT
MARKER SKIN DUAL TIP RULER LAB (MISCELLANEOUS) ×2 IMPLANT
NDL SAFETY ECLIPSE 18X1.5 (NEEDLE) ×1 IMPLANT
NEEDLE HYPO 18GX1.5 SHARP (NEEDLE) ×1
NEEDLE SPNL 18GX3.5 QUINCKE PK (NEEDLE) ×2 IMPLANT
OBTURATOR OPTICAL STANDARD 8MM (TROCAR) ×1
OBTURATOR OPTICAL STND 8 DVNC (TROCAR) ×1
OBTURATOR OPTICALSTD 8 DVNC (TROCAR) ×1 IMPLANT
OCCLUDER COLPOPNEUMO (BALLOONS) ×2 IMPLANT
PAD POSITIONING PINK XL (MISCELLANEOUS) ×2 IMPLANT
PORT ACCESS TROCAR AIRSEAL 12 (TROCAR) ×1 IMPLANT
PORT ACCESS TROCAR AIRSEAL 5M (TROCAR) ×1
POUCH SPECIMEN RETRIEVAL 10MM (ENDOMECHANICALS) IMPLANT
SCISSORS LAP 5X35 DISP (ENDOMECHANICALS) ×2 IMPLANT
SEAL CANN UNIV 5-8 DVNC XI (MISCELLANEOUS) ×4 IMPLANT
SEAL XI 5MM-8MM UNIVERSAL (MISCELLANEOUS) ×4
SEALER TISSUE G2 CVD JAW 45CM (ENDOMECHANICALS) ×2 IMPLANT
SET TRI-LUMEN FLTR TB AIRSEAL (TUBING) ×2 IMPLANT
SHEET LAVH (DRAPES) ×2 IMPLANT
SOLUTION ELECTROLUBE (MISCELLANEOUS) ×2 IMPLANT
SURGIFLO W/THROMBIN 8M KIT (HEMOSTASIS) IMPLANT
SUT MNCRL AB 4-0 PS2 18 (SUTURE) ×4 IMPLANT
SUT PDS AB 0 CT 36 (SUTURE) ×2 IMPLANT
SUT VIC AB 0 CT1 27 (SUTURE) ×1
SUT VIC AB 0 CT1 27XBRD ANTBC (SUTURE) ×1 IMPLANT
SUT VIC AB 2-0 SH 27 (SUTURE) ×1
SUT VIC AB 2-0 SH 27X BRD (SUTURE) ×1 IMPLANT
SUT VIC AB 3-0 SH 27 (SUTURE) ×2
SUT VIC AB 3-0 SH 27X BRD (SUTURE) ×2 IMPLANT
SUT VICRYL 0 UR6 27IN ABS (SUTURE) ×4 IMPLANT
SYR 10ML LL (SYRINGE) ×2 IMPLANT
SYR 50ML LL SCALE MARK (SYRINGE) ×2 IMPLANT
TOWEL OR 17X26 10 PK STRL BLUE (TOWEL DISPOSABLE) ×4 IMPLANT
TOWEL OR NON WOVEN STRL DISP B (DISPOSABLE) ×2 IMPLANT
TRAP SPECIMEN MUCOUS 40CC (MISCELLANEOUS) IMPLANT
TRAY FOLEY W/METER SILVER 16FR (SET/KITS/TRAYS/PACK) ×2 IMPLANT
TRAY LAPAROSCOPIC (CUSTOM PROCEDURE TRAY) ×2 IMPLANT
TROCAR BLADELESS OPT 5 100 (ENDOMECHANICALS) ×2 IMPLANT
TROCAR XCEL BLUNT TIP 100MML (ENDOMECHANICALS) ×2 IMPLANT
UNDERPAD 30X30 (UNDERPADS AND DIAPERS) ×4 IMPLANT
WATER STERILE IRR 1500ML POUR (IV SOLUTION) ×4 IMPLANT

## 2016-10-02 NOTE — H&P (View-Only) (Signed)
Consult Note: Gyn-Onc  Consult was requested by Dr. Landry Mellow for the evaluation of [name] 76 y.o. female  CC:  Chief Complaint  Patient presents with  . Endometrial Adenocarcinoma    Assessment/Plan:  Ms. JOSLYNNE KLATT  is a 76 y.o.  year old with grade 1 endometrioid endometrial cancer in the setting of a history of a Whipple procedure for duodenal GIST tumor in 2012 and a history of a postoperative DVT in 2012.  A detailed discussion was held with the patient and her family with regard to to her endometrial cancer diagnosis. We discussed the standard management options for uterine cancer which includes surgery followed possibly by adjuvant therapy depending on the results of surgery. The options for surgical management include a hysterectomy and removal of the tubes and ovaries possibly with removal of pelvic and para-aortic lymph nodes.If feasible, a minimally invasive approach including a robotic hysterectomy or laparoscopic hysterectomy have benefits including shorter hospital stay, recovery time and better wound healing than with open surgery. The patient has been counseled about these surgical options and the risks of surgery in general including infection, bleeding, damage to surrounding structures (including bowel, bladder, ureters, nerves or vessels), and the postoperative risks of PE/ DVT, and lymphedema. I extensively reviewed the additional risks of robotic hysterectomy including possible need for conversion to open laparotomy.  I discussed positioning during surgery of trendelenberg and risks of minor facial swelling and care we take in preoperative positioning.  After counseling and consideration of her options, she desires to proceed with robotic assisted total hysterectomy with bilateral sapingo-oophorectomy and SLN biopsy.   I explained that her prior whipple procedure and postop pancreatic leak puts her at increased likelihood for having significant abdominal  adhesive disease. As such, it may become necessary for [place] to convert to laparotomy to safely accomplish the surgery. I have scheduled additional time to factor this. I discussed that this also places her at increased risk for surgical complications. I discussed that due to her prior history of DVT, I am recommending postoperative Lovenox x 2 weeks (if laparoscopy, if laparotomy I will prescribe 4 weeks) to reduce the risk for postoperative VTE of which she is at greater risk.  She will be seen by anesthesia for preoperative clearance and discussion of postoperative pain management.  She was given the opportunity to ask questions, which were answered to her satisfaction, and she is agreement with the above mentioned plan of care.  As the patient has commitments on the last week of April and first week of May we have scheduled the surgery for 10/02/16 to accommodate her schedule.   HPI: [name] is a 76 year old woman who is seen in consultation at the request of Dr Landry Mellow for grade 1 endometrioid endometrial adenocarcinoma. The patient reports experiencing postmenopausal bleeding since December, 2017. She saw Dr Landry Mellow and an [place] was performed on 08/14/16 and showed a uterus measuring 6.2x3.8x4.6cm with a thickened endometrium at 1.5cm. The ovaries were normal.  An endometrial biopsy was initially attempted but unsuccessful due to cervical stenosis. Repeat procedure with cytotec facilitated sampling in the office on 08/20/16 which showed grade 1 endometrial adenocarcinoma of the endometrium.  The patient's past medical history is significant for a duodenal GIST tumor diagnosed in 2012. For this she underwent a laparotomy and Whipple procedure with Dr Barry Dienes. Postoperatively her course was complicated by a pancreatic leak requiring a drain placement. She also developed a postop DVT in the right lower extremity. This  was treated with lovenox for 6 months. She is now on ASA 81 mg daily. She has been tested for  familial hypercoagulopathy and is negative.  She has a personal hx of CAD (50% LAD occlusion), no stenting requiring in 2009. She was treated conservatively with plavix and ASA but taken off these when she experienced GI bleeding in 2009 (however, it appears this was due to her occult duodenal tumor). Her last stress test was within the year and was normal. She sees a cardiologist at [place].   She has had approximately 2 bouts of acute pancreatitis per year since her Whipple, the most serious was in February, 2018 for which she was hospitalized for 2 days. 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  Current Meds:  Outpatient Encounter Prescriptions as of 09/10/2016  Medication Sig  . aspirin EC 81 MG tablet Take 81 mg by mouth daily.  . cholecalciferol (VITAMIN D) 1000 UNITS tablet Take 1,000 Units by mouth daily.   . Coenzyme Q10 (CO Q 10 PO) Take 1 tablet by mouth daily.  Marland Kitchen docusate sodium (COLACE) 50 MG capsule Take 1 capsule (50 mg total) by mouth 2 (two) times daily. Hold for diarrhea  . lipase/protease/amylase 24000-76000 units CPEP Take 1 capsule (24,000 Units total) by mouth 3 (three) times daily before meals.  . Melatonin 10 MG TABS Take 10 mg by mouth at bedtime.  . metoprolol succinate (TOPROL-XL) 100 MG 24 hr tablet Take 50-100 mg by mouth daily. Take '100mg'$  by mouth in the morning and '50mg'$  by mouth at night.  . pantoprazole (PROTONIX) 40 MG tablet Take 40 mg by mouth daily.    . simvastatin (ZOCOR) 20 MG tablet Take 20 mg by mouth daily.   . sucralfate (CARAFATE) 1 g tablet Take 1 g by mouth 2 (two) times daily.  . ondansetron (ZOFRAN ODT) 8 MG disintegrating tablet Take 1 tablet (8 mg total) by mouth every 8 (eight) hours as needed for nausea or vomiting. (Patient not taking: Reported on 09/10/2016)  . oxyCODONE (OXY IR/ROXICODONE) 5 MG immediate release tablet Take 1-2 tablets (5-10 mg total)  by mouth every 6 (six) hours as needed for severe pain. (Patient not taking: Reported on 09/10/2016)  . polyethylene glycol (MIRALAX / GLYCOLAX) packet Take 17 g by mouth daily as needed for mild constipation. (Patient not taking: Reported on 09/10/2016)   No facility-administered encounter medications on file as of 09/10/2016.     Allergy: No Known Allergies  Social Hx:   Social History   Social History  . Marital status: Married    Spouse name: N/A  . Number of children: N/A  . Years of education: N/A   Occupational History  . Not on file.   Social History Main Topics  . Smoking status: Never Smoker  . Smokeless tobacco: Never Used  . Alcohol use No  . Drug use: No  . Sexual activity: Not on file   Other Topics Concern  . Not on file   Social History Narrative   ** Merged History Encounter **        Past Surgical Hx:  Past Surgical History:  Procedure Laterality Date  . BREAST SURGERY    . PANCREATICODUODENECTOMY  06/27/2010  . TUBAL LIGATION      Past Medical Hx:  Past Medical History:  Diagnosis Date  . Cancer ([place])   . Diabetes mellitus without complication ([place])  pre-diabetic  . DVT of lower extremity (deep venous thrombosis) [facility]) March 2012  . GIST (gastrointestinal stromal tumor), malignant ([facility])    T2N0  . Reflux   . Ulcer     Past Gynecological History:  SVD x 1 No LMP recorded. Patient is postmenopausal.  Family Hx: History reviewed. No pertinent family history.  Review of Systems:  Constitutional  Feels well,    ENT Normal appearing ears and nares bilaterally Skin/Breast  No rash, sores, jaundice, itching, dryness Cardiovascular  No chest pain, shortness of breath, or edema  Pulmonary  No cough or wheeze.  Gastro Intestinal  No nausea, vomitting, or diarrhoea. No bright red blood per rectum, change in bowel movement, or constipation. + intermittent epigastric pain with bouts of pancreatitis Genito Urinary  No frequency, urgency,  dysuria, + postmenopausal bleeding Musculo Skeletal  No myalgia, arthralgia, joint swelling or pain  Neurologic  No weakness, numbness, change in gait,  Psychology  No depression, anxiety, insomnia.   Vitals:  Blood pressure (!) 118/57, pulse 65, temperature 98.1 F (36.7 C), temperature source Oral, resp. rate 18, height '5\' 5"'$  (1.651 m), weight 146 lb 1.6 oz (66.3 kg).  Physical Exam: WD in NAD Neck  Supple NROM, without any enlargements.  Lymph Node Survey No cervical supraclavicular or inguinal adenopathy Cardiovascular  Pulse normal rate, regularity and rhythm. S1 and S2 normal.  Lungs  Clear to auscultation bilateraly, without wheezes/crackles/rhonchi. Good air movement.  Skin  No rash/lesions/breakdown  Psychiatry  Alert and oriented to person, place, and time  Abdomen  Normoactive bowel sounds, abdomen soft, non-tender and nonobese without evidence of hernia. Large scar in upper abdomen and across right mid abdomen - well healed. Back No CVA tenderness Genito Urinary  Vulva/vagina: Normal external female genitalia.   No lesions. No discharge or bleeding.  Bladder/urethra:  No lesions or masses, well supported bladder  Vagina: normal  Cervix: Normal appearing, no lesions.  Uterus:  Small, mobile, no parametrial involvement or nodularity.  Adnexa: no palpable masses. Rectal  deferred Extremities  No bilateral cyanosis, clubbing or edema.   Donaciano Eva, MD  09/10/2016, 11:58 AM

## 2016-10-02 NOTE — Transfer of Care (Signed)
Immediate Anesthesia Transfer of Care Note  Patient: [name]  Procedure(s) Performed: Procedure(s): XI ROBOTIC ASSISTED TOTAL HYSTERECTOMY WITH BILATERAL SALPINGO OOPHORECTOMY WITH SENTINAL LYMPH NODE BIOPSY LYSIS OF ADHESIONS, EXPLORATORY LAPAROTOMY (Bilateral)  Patient Location: PACU  Anesthesia Type:General  Level of Consciousness: awake, alert  and oriented  Airway & Oxygen Therapy: Patient Spontanous Breathing and Patient connected to face mask oxygen  Post-op Assessment: Report given to RN and Post -op Vital signs reviewed and stable  Post vital signs: Reviewed and stable  Last Vitals:  Vitals:   10/02/16 1043 10/02/16 1045  BP:    Pulse: (P) 78 (P) 75  Resp: (P) 12 (P) 13  Temp:      Last Pain:  Vitals:   10/02/16 0537  TempSrc:   PainSc: 0-No pain      Patients Stated Pain Goal: 3 (73/22/02 5427)  Complications: No apparent anesthesia complications

## 2016-10-02 NOTE — Anesthesia Procedure Notes (Signed)
Procedure Name: Intubation Date/Time: 10/02/2016 7:37 AM Performed by: Noralyn Pick D Pre-anesthesia Checklist: Patient identified, Emergency Drugs available, Suction available and Patient being monitored Patient Re-evaluated:Patient Re-evaluated prior to inductionOxygen Delivery Method: Circle system utilized Preoxygenation: Pre-oxygenation with 100% oxygen Intubation Type: IV induction Ventilation: Mask ventilation without difficulty Laryngoscope Size: Mac and 3 Grade View: Grade II Tube type: Oral Number of attempts: 1 Airway Equipment and Method: Stylet and Bougie stylet Placement Confirmation: ETT inserted through vocal cords under direct vision,  positive ETCO2 and breath sounds checked- equal and bilateral Tube secured with: Tape Dental Injury: Teeth and Oropharynx as per pre-operative assessment

## 2016-10-02 NOTE — Interval H&P Note (Signed)
History and Physical Interval Note:  10/02/2016 7:07 AM  [name]  has presented today for surgery, with the diagnosis of endomentrial cancer  The various methods of treatment have been discussed with the patient and family. After consideration of risks, benefits and other options for treatment, the patient has consented to  Procedure(s): XI ROBOTIC ASSISTED TOTAL HYSTERECTOMY WITH BILATERAL SALPINGO OOPHORECTOMY WITH SENTINAL LYMPH NODE BIOPSY (Bilateral) as a surgical intervention .  The patient's history has been reviewed, patient examined, no change in status, stable for surgery.  I have reviewed the patient's chart and labs.  Questions were answered to the patient's satisfaction.     Donaciano Eva

## 2016-10-02 NOTE — Anesthesia Preprocedure Evaluation (Signed)
Anesthesia Evaluation  Patient identified by MRN, date of birth, ID band Patient awake    Reviewed: Allergy & Precautions, NPO status , Patient's Chart, lab work & pertinent test results  Airway Mallampati: II  TM Distance: >3 FB Neck ROM: Full    Dental no notable dental hx.    Pulmonary neg pulmonary ROS,    Pulmonary exam normal breath sounds clear to auscultation       Cardiovascular hypertension, Pt. on medications and Pt. on home beta blockers Normal cardiovascular exam Rhythm:Regular Rate:Normal     Neuro/Psych negative neurological ROS  negative psych ROS   GI/Hepatic negative GI ROS, Neg liver ROS,   Endo/Other  diabetes, Insulin Dependent  Renal/GU negative Renal ROS  negative genitourinary   Musculoskeletal negative musculoskeletal ROS (+)   Abdominal   Peds negative pediatric ROS (+)  Hematology negative hematology ROS (+)   Anesthesia Other Findings   Reproductive/Obstetrics negative OB ROS                             Anesthesia Physical Anesthesia Plan  ASA: II  Anesthesia Plan: General   Post-op Pain Management:    Induction: Intravenous  Airway Management Planned: Oral ETT  Additional Equipment:   Intra-op Plan:   Post-operative Plan: Extubation in OR  Informed Consent: I have reviewed the patients History and Physical, chart, labs and discussed the procedure including the risks, benefits and alternatives for the proposed anesthesia with the patient or authorized representative who has indicated his/her understanding and acceptance.   Dental advisory given  Plan Discussed with: CRNA and Surgeon  Anesthesia Plan Comments:         Anesthesia Quick Evaluation

## 2016-10-02 NOTE — Op Note (Signed)
OPERATIVE NOTE 10/02/16  Surgeon: Donaciano Eva   Assistants: Dr Lahoma Crocker (an MD assistant was necessary for tissue manipulation, management of robotic instrumentation, retraction and positioning due to the complexity of the case and hospital policies).   Anesthesia: General endotracheal anesthesia  ASA Class: 3   Pre-operative Diagnosis:  grade 1 endometrial cancer  Post-operative Diagnosis: same  Operation: Robotic-assisted laparoscopic total hysterectomy with bilateral salpingoophorectomy, lysis of adhesions, exploratory laparotomy.  Surgeon: Donaciano Eva  Assistant Surgeon: Lahoma Crocker MD  Anesthesia: GET  Urine Output: 300  Operative Findings:  : dense upper abdominal adhesions between bowel and omentum and anterior abdominal wall, normal appearing tubes and ovaries, no suspicious nodes.  Estimated Blood Loss:  less than 50 mL      Total IV Fluids: 800 ml         Specimens: right internal iliac SLN, left obturator SLN, uterus with cervix, bilateral tubes and ovaries.         Complications:  None; patient tolerated the procedure well.         Disposition: PACU - hemodynamically stable.  Procedure Details  The patient was seen in the Holding Room. The risks, benefits, complications, treatment options, and expected outcomes were discussed with the patient.  The patient concurred with the proposed plan, giving informed consent.  The site of surgery properly noted/marked. The patient was identified as [name] and the procedure verified as a Robotic-assisted hysterectomy with bilateral salpingo oophorectomy. A Time Out was held and the above information confirmed.  After induction of anesthesia, the patient was draped and prepped in the usual sterile manner. Pt was placed in supine position after anesthesia and draped and prepped in the usual sterile manner. The abdominal drape was placed after the CholoraPrep had been allowed to dry for  3 minutes.  Her arms were tucked to her side with all appropriate precautions.  The shoulders were stabilized with padded shoulder blocks applied to the acromium processes.  The patient was placed in the semi-lithotomy position in [place].  The perineum was prepped with Betadine. The patient was then prepped. Foley catheter was placed.  A sterile speculum was placed in the vagina.  The cervix was grasped with a single-tooth tenaculum and dilated with Kennon Rounds dilators.  '1mg'$  total of ICG was injected into the cervical stroma at 2 and 9 o'clock at a 64m depth (concentration 0..'5mg'$ /ml). The ZUMI uterine manipulator with a medium colpotomizer ring was placed without difficulty.  A pneum occluder balloon was placed over the manipulator.  OG tube placement was confirmed and to suction.   Next, a 5 mm skin incision was made 1 cm below the subcostal margin in the midclavicular line.  The 5 mm Optiview port and scope was used for direct entry.There were dense adhesions surrounding the port though no apparent visceral injury. Unfortunately it was not possible to visualize the abdominal cavity and therefore this port was not used for additional interventions.  A 2cm incision was made in the umbilicus and the fascia was identified, grasped and elevated with kocher clamps. A scalpel was used to incise the fascia and peritoneum. A zero vicryl was used to secure the fascia on the superior and inferior edges. Adhesions between ileum and the anterior abdominal wall were present immediately underlying the peritoneum. In the process of performing sharp adhesiolysis to dissect the bowel from the anterior abdominal wall at the umbilicus there were 2 deserosalizations of the ileum. These were oversewn with 2-0 vicryl  in imbricating lemberts sutures.  The hassan trochar was placed into the umbilical incision and secured with vicryl. The left mid and lower abdomen were free of adhesions. Under direct visualization 2 42m robotic  ports were placed in the mid left abdomen without contact with visceral structures. This allowed for introduction of laparoscopic endoshears which were used to dissect the small intestine free from the right mid abdomen. When adequate clearance of the anterior abdominal wall had taken place, the 841mrobotic port and a 1250mssistant port were placed in the right mid and upper abdomen without contact with underlying viscera. The patient was placed in steep Trendelenburg.  Bowel was folded away into the upper abdomen.  The robot was docked in the normal manner.  Prior to proceeding with the pelvic surgery the camera was spun and the upper abdomen was evaluated. Sharp dissection with robotic scissors was employed to dissect the small intestine and omentum free from the site where the initial left upper quadrant port was placed. Close inspection of the bowel and omentum confirmed no apparent injury.   Attention was again turned to the pelvis. The right and left peritoneum were opened parallel to the IP ligament to open the retroperitoneal spaces bilaterally. The SLN mapping was performed in bilateral pelvic basins. The para rectal and paravesical spaces were opened up. Lymphatic channels were identified travelling to the following visualized sentinel lymph node's: right internal iliac, left obturator. These SLN's were separated from their surrounding lymphatic tissue, removed and sent for permanent pathology.  The hysterectomy was started after the round ligament on the right side was incised and the retroperitoneum was entered and the pararectal space was developed.  The ureter was noted to be on the medial leaf of the broad ligament.  The peritoneum above the ureter was incised and stretched and the infundibulopelvic ligament was skeletonized, cauterized and cut.  The posterior peritoneum was taken down to the level of the KOH ring.  The anterior peritoneum was also taken down.  The bladder flap was created to  the level of the KOH ring.  The uterine artery on the right side was skeletonized, cauterized and cut in the normal manner.  A similar procedure was performed on the left.  The colpotomy was made and the uterus, cervix, bilateral ovaries and tubes were amputated and delivered through the vagina.  Pedicles were inspected and excellent hemostasis was achieved.    The colpotomy at the vaginal cuff was closed with Vicryl on a CT1 needle in a running manner.  Irrigation was used and excellent hemostasis was achieved.  At this point in the procedure was completed.  Robotic instruments were removed under direct visulaization.  The robot was undocked.   The umbilical incision was extended to 4cm to allow visualization of the underlying viscera. Sharp dissection was performed to free small bowel loops from the anterior abdominal wall and to ensure there was tension free imbrication of the ileal serosal sutures. The is incision was then closed with 0-prolene at the fascia. Exparel was infiltrated into the incisions and fascia.   The 10 mm ports were closed with Vicryl on a UR-5 needle and the fascia was closed with 0 Vicryl on a UR-5 needle.  The skin was closed with 4-0 Vicryl in a subcuticular manner.  Dermabond was applied.  Sponge, lap and needle counts correct x 2.  The patient was taken to the recovery room in stable condition.  The vagina was swabbed with  minimal bleeding noted.  All instrument and needle counts were correct x  3.   The patient was transferred to the recovery room in a stable condition.  Donaciano Eva, MD

## 2016-10-02 NOTE — Telephone Encounter (Signed)
I have called the sixth floor and gave them her post op appt. Also the appt will print on her discharge summary.

## 2016-10-02 NOTE — Anesthesia Postprocedure Evaluation (Signed)
Anesthesia Post Note  Patient: [name]  Procedure(s) Performed: Procedure(s) (LRB): XI ROBOTIC ASSISTED TOTAL HYSTERECTOMY WITH BILATERAL SALPINGO OOPHORECTOMY WITH SENTINAL LYMPH NODE BIOPSY LYSIS OF ADHESIONS, EXPLORATORY LAPAROTOMY (Bilateral)  Patient location during evaluation: PACU Anesthesia Type: General Level of consciousness: awake and alert Pain management: pain level controlled Vital Signs Assessment: post-procedure vital signs reviewed and stable Respiratory status: spontaneous breathing, nonlabored ventilation, respiratory function stable and patient connected to nasal cannula oxygen Cardiovascular status: blood pressure returned to baseline and stable Postop Assessment: no signs of nausea or vomiting Anesthetic complications: no       Last Vitals:  Vitals:   10/02/16 1344 10/02/16 1441  BP: (!) 139/55 (!) 118/59  Pulse: 66 69  Resp: 14 14  Temp: 36.7 C 36.6 C    Last Pain:  Vitals:   10/02/16 1441  TempSrc: Oral  PainSc:                  [name]

## 2016-10-03 ENCOUNTER — Encounter (HOSPITAL_COMMUNITY): Payer: Self-pay | Admitting: Gynecologic Oncology

## 2016-10-03 DIAGNOSIS — Z794 Long term (current) use of insulin: Secondary | ICD-10-CM | POA: Diagnosis not present

## 2016-10-03 DIAGNOSIS — I10 Essential (primary) hypertension: Secondary | ICD-10-CM | POA: Diagnosis not present

## 2016-10-03 DIAGNOSIS — K219 Gastro-esophageal reflux disease without esophagitis: Secondary | ICD-10-CM | POA: Diagnosis not present

## 2016-10-03 DIAGNOSIS — C541 Malignant neoplasm of endometrium: Secondary | ICD-10-CM | POA: Diagnosis not present

## 2016-10-03 DIAGNOSIS — E119 Type 2 diabetes mellitus without complications: Secondary | ICD-10-CM | POA: Diagnosis not present

## 2016-10-03 DIAGNOSIS — I2510 Atherosclerotic heart disease of native coronary artery without angina pectoris: Secondary | ICD-10-CM | POA: Diagnosis not present

## 2016-10-03 DIAGNOSIS — Z79899 Other long term (current) drug therapy: Secondary | ICD-10-CM | POA: Diagnosis not present

## 2016-10-03 DIAGNOSIS — Z86718 Personal history of other venous thrombosis and embolism: Secondary | ICD-10-CM | POA: Diagnosis not present

## 2016-10-03 DIAGNOSIS — Z7982 Long term (current) use of aspirin: Secondary | ICD-10-CM | POA: Diagnosis not present

## 2016-10-03 LAB — BASIC METABOLIC PANEL
Anion gap: 6 (ref 5–15)
BUN: 12 mg/dL (ref 6–20)
CO2: 26 mmol/L (ref 22–32)
Calcium: 8.3 mg/dL — ABNORMAL LOW (ref 8.9–10.3)
Chloride: 105 mmol/L (ref 101–111)
Creatinine, Ser: 0.7 mg/dL (ref 0.44–1.00)
GFR calc Af Amer: >60 mL/min (ref >60)
GFR calc non Af Amer: >60 mL/min (ref >60)
Glucose, Bld: 210 mg/dL — ABNORMAL HIGH (ref 65–99)
Potassium: 4.5 mmol/L (ref 3.5–5.1)
Sodium: 137 mmol/L (ref 135–145)

## 2016-10-03 LAB — CBC
HCT: 30.9 % — ABNORMAL LOW (ref 36.0–46.0)
Hemoglobin: 10.2 g/dL — ABNORMAL LOW (ref 12.0–15.0)
MCH: 29.8 pg (ref 26.0–34.0)
MCHC: 33 g/dL (ref 30.0–36.0)
MCV: 90.4 fL (ref 78.0–100.0)
Platelets: 189 10*3/uL (ref 150–400)
RBC: 3.42 MIL/uL — ABNORMAL LOW (ref 3.87–5.11)
RDW: 15.1 % (ref 11.5–15.5)
WBC: 8.2 10*3/uL (ref 4.0–10.5)

## 2016-10-03 LAB — GLUCOSE, CAPILLARY: Glucose-Capillary: 178 mg/dL — ABNORMAL HIGH (ref 65–99)

## 2016-10-03 MED ORDER — IBUPROFEN 800 MG PO TABS: 800.0000 mg | ORAL_TABLET | Freq: Three times a day (TID) | ORAL | 0 refills | Status: DC | PRN

## 2016-10-03 MED ORDER — SENNA 8.6 MG PO TABS: 1.0000 | ORAL_TABLET | Freq: Every day | ORAL | 0 refills | Status: DC

## 2016-10-03 MED ORDER — ENOXAPARIN SODIUM 40 MG/0.4ML ~~LOC~~ SOLN: 40.0000 mg | SUBCUTANEOUS | 0 refills | Status: DC

## 2016-10-03 NOTE — Progress Notes (Signed)
Pt states that she takes 10 units of lantus at home at bedtime. Pt has been ordered 80 units of lantus for HS. Notified on-call MD Dr. Delsa Sale who ordered for the patient to have 8 units of lantus for tonight. Will continue to monitor.

## 2016-10-03 NOTE — Progress Notes (Signed)
Discharge planning, no HH needs identified. [phone] 

## 2016-10-03 NOTE — Discharge Summary (Signed)
Physician Discharge Summary  Patient ID: [name] MRN: [id number] DOB/AGE: 1941/01/07 76 y.o.  Admit date: 10/02/2016 Discharge date: 10/03/2016  Admission Diagnoses: Endometrial adenocarcinoma [facility])  Discharge Diagnoses:  Principal Problem:   Endometrial adenocarcinoma [facility]) Active Problems:   Endometrial cancer [facility])   Discharged Condition: good  Hospital Course:  1/ patient was admitted on 10/02/16 for a robotic assisted total hysterectomy, BSO, sentinel lymph node biopsy and lysis of adhesions with minilaparotomy for oversewing of deserosalized ileum. 2/ surgery was complicated by dense upper abdominal adhesive disease requiring extensive lysis of adhesions. 3/ on postoperative day 1 the patient was meeting discharge criteria: tolerating PO, voiding urine, ambulating, pain well controlled on oral medications.  4/ new medications on discharge include Lovenox x 2 weeks. She will hold aspirin while on lovenox.  She was advised that because her surgery was complicated by extensive lysis of adhesions, she is at increased risk for postop bowel complications, addtionally, being on lovenox places her at increased postop bleeding risk. She was counseled regarding the importance of reaching out to our office should her condition deteriorate in the coming weeks.  Consults: None  Significant Diagnostic Studies: labs:  CBC    Component Value Date/Time   WBC 8.2 10/03/2016 0425   RBC 3.42 (L) 10/03/2016 0425   HGB 10.2 (L) 10/03/2016 0425   HGB 13.0 12/13/2010 1226   HCT 30.9 (L) 10/03/2016 0425   HCT 39.7 12/13/2010 1226   PLT 189 10/03/2016 0425   PLT 239 12/13/2010 1226   MCV 90.4 10/03/2016 0425   MCV 90.6 12/13/2010 1226   MCH 29.8 10/03/2016 0425   MCHC 33.0 10/03/2016 0425   RDW 15.1 10/03/2016 0425   RDW 14.6 (H) 12/13/2010 1226   LYMPHSABS 2.3 09/28/2016 1042   LYMPHSABS 2.0 12/13/2010 1226   MONOABS 0.4 09/28/2016 1042   MONOABS 0.5 12/13/2010 1226   EOSABS 0.2  09/28/2016 1042   EOSABS 0.4 12/13/2010 1226   BASOSABS 0.0 09/28/2016 1042   BASOSABS 0.0 12/13/2010 1226    Treatments: surgery: see above  Discharge Exam: Blood pressure (!) 105/45, pulse 66, temperature 98.3 F (36.8 C), temperature source Oral, resp. rate 17, height '5\' 5"'$  (1.651 m), weight 143 lb (64.9 kg), SpO2 100 %. General appearance: alert GI: soft, non-tender; bowel sounds normal; no masses,  no organomegaly Incision/Wound: clean, dry and in tact x 6  Disposition: 01-Home or Self Care  Discharge Instructions    (HEART FAILURE PATIENTS) Call MD:  Anytime you have any of the following symptoms: 1) 3 pound weight gain in 24 hours or 5 pounds in 1 week 2) shortness of breath, with or without a dry hacking cough 3) swelling in the hands, feet or stomach 4) if you have to sleep on extra pillows at night in order to breathe.    Complete by:  As directed    Call MD for:  difficulty breathing, headache or visual disturbances    Complete by:  As directed    Call MD for:  extreme fatigue    Complete by:  As directed    Call MD for:  hives    Complete by:  As directed    Call MD for:  persistant dizziness or light-headedness    Complete by:  As directed    Call MD for:  persistant nausea and vomiting    Complete by:  As directed    Call MD for:  redness, tenderness, or signs of infection (pain, swelling, redness, odor or  green/yellow discharge around incision site)    Complete by:  As directed    Call MD for:  severe uncontrolled pain    Complete by:  As directed    Call MD for:  temperature >100.4    Complete by:  As directed    Diet - low sodium heart healthy    Complete by:  As directed    Diet general    Complete by:  As directed    Driving Restrictions    Complete by:  As directed    No driving for 7 days or until off narcotic pain medication   Increase activity slowly    Complete by:  As directed    Remove dressing in 24 hours    Complete by:  As directed    Sexual  Activity Restrictions    Complete by:  As directed    No intercourse for 6 weeks     Allergies as of 10/03/2016   No Known Allergies     Medication List    STOP taking these medications   aspirin EC 81 MG tablet     TAKE these medications   cholecalciferol 1000 units tablet Commonly known as:  VITAMIN D Take 1,000 Units by mouth daily.   CoQ-10 100 MG Caps Take 100 mg by mouth daily.   docusate sodium 100 MG capsule Commonly known as:  COLACE Take 100 mg by mouth daily.   enoxaparin 40 MG/0.4ML injection Commonly known as:  LOVENOX Inject 0.4 mLs (40 mg total) into the skin daily. Start taking on:  10/04/2016   ibuprofen 800 MG tablet Commonly known as:  ADVIL,MOTRIN Take 1 tablet (800 mg total) by mouth every 8 (eight) hours as needed (mild pain).   insulin aspart 100 UNIT/ML injection Commonly known as:  novoLOG Inject into the skin See admin instructions. Sliding scale  For meal coverage   insulin glargine 100 UNIT/ML injection Commonly known as:  LANTUS Inject 10 Units into the skin at bedtime.   Melatonin 10 MG Tabs Take 10 mg by mouth at bedtime.   metoprolol 50 MG tablet Commonly known as:  LOPRESSOR Take 25-50 mg by mouth 2 (two) times daily. Take '50mg'$ s in the morning and '25mg'$ s in the evening   nitroGLYCERIN 0.4 MG SL tablet Commonly known as:  NITROSTAT Place 0.4 mg under the tongue every 5 (five) minutes as needed for chest pain.   ondansetron 8 MG disintegrating tablet Commonly known as:  ZOFRAN ODT Take 1 tablet (8 mg total) by mouth every 8 (eight) hours as needed for nausea or vomiting.   oxyCODONE 5 MG immediate release tablet Commonly known as:  Oxy IR/ROXICODONE Take 1-2 tablets (5-10 mg total) by mouth every 6 (six) hours as needed for severe pain. What changed:  how much to take   Pancrelipase (Lip-Prot-Amyl) 24000-76000 units Cpep Take 1 capsule (24,000 Units total) by mouth 3 (three) times daily before meals.   pantoprazole 40 MG  tablet Commonly known as:  PROTONIX Take 40 mg by mouth daily before breakfast.   polyethylene glycol packet Commonly known as:  MIRALAX / GLYCOLAX Take 17 g by mouth daily as needed for mild constipation.   senna 8.6 MG Tabs tablet Commonly known as:  SENOKOT Take 1 tablet (8.6 mg total) by mouth at bedtime.   simvastatin 20 MG tablet Commonly known as:  ZOCOR Take 20 mg by mouth every evening.   sucralfate 1 g tablet Commonly known as:  CARAFATE Take 1 g  by mouth 2 (two) times daily.        Signed: Donaciano Eva 10/03/2016, 8:45 AM

## 2016-10-03 NOTE — Discharge Instructions (Signed)
10/02/2016  Return to work: 4 weeks  Activity: 1. Be up and out of the bed during the day.  Take a nap if needed.  You may walk up steps but be careful and use the hand rail.  Stair climbing will tire you more than you think, you may need to stop part way and rest.   2. No lifting or straining for 6 weeks.  3. No driving for 1 weeks.  Do Not drive if you are taking narcotic pain medicine.  4. Shower daily.  Use soap and water on your incision and pat dry; don't rub.   5. No sexual activity and nothing in the vagina for 8 weeks.  Medications:  - Take ibuprofen and tylenol first line for pain control. Take these regularly (every 6 hours) to decrease the build up of pain.  - If necessary, for severe pain not relieved by ibuprofen, take percocet.  - While taking percocet you should take sennakot every night to reduce the likelihood of constipation. If this causes diarrhea, stop its use.  - continue to not take your aspirin while you are on the Lovenox shots. Once you stop lovenox in 2 weeks time, you can restart aspirin.  - You have been prescribed lovenox shots to take for 2 weeks postoperatively.  Diet: 1. Low sodium Heart Healthy Diet is recommended.  2. It is safe to use a laxative if you have difficulty moving your bowels.   Wound Care: 1. Keep clean and dry.  Shower daily.  Reasons to call the Doctor:   Fever - Oral temperature greater than 100.4 degrees Fahrenheit  Foul-smelling vaginal discharge  Difficulty urinating  Nausea and vomiting  Increased pain at the site of the incision that is unrelieved with pain medicine.  Difficulty breathing with or without chest pain  New calf pain especially if only on one side  Sudden, continuing increased vaginal bleeding with or without clots.   Follow-up: 1. See Everitt Amber in 3 weeks.  Contacts: For questions or concerns you should contact:  Dr. Everitt Amber at [phone] After hours and on week-ends call [phone] and ask to speak to the physician on call for Gynecologic Oncology

## 2016-10-04 ENCOUNTER — Telehealth: Payer: Self-pay | Admitting: Gynecologic Oncology

## 2016-10-04 NOTE — Telephone Encounter (Signed)
Post op telephone call to check patient status.  Patient describes expected post operative status.  Adequate PO intake reported.  Bladder functioning without difficulty.  Taking medication to assist with having a bowel movement.  Pain minimal.  Reportable signs and symptoms reviewed.  Follow up appt given.  Final path discussed with Dr. Serita Grit recommendations for no further treatment.  Advised to call for any needs or concerns.

## 2016-10-18 DIAGNOSIS — C541 Malignant neoplasm of endometrium: Secondary | ICD-10-CM | POA: Diagnosis not present

## 2016-10-18 DIAGNOSIS — E1165 Type 2 diabetes mellitus with hyperglycemia: Secondary | ICD-10-CM | POA: Diagnosis not present

## 2016-10-18 DIAGNOSIS — Z8719 Personal history of other diseases of the digestive system: Secondary | ICD-10-CM | POA: Diagnosis not present

## 2016-10-18 DIAGNOSIS — Z794 Long term (current) use of insulin: Secondary | ICD-10-CM | POA: Diagnosis not present

## 2016-10-18 DIAGNOSIS — R42 Dizziness and giddiness: Secondary | ICD-10-CM | POA: Diagnosis not present

## 2016-10-24 ENCOUNTER — Telehealth: Payer: Self-pay

## 2016-10-24 ENCOUNTER — Encounter: Payer: Self-pay | Admitting: Gynecologic Oncology

## 2016-10-24 ENCOUNTER — Ambulatory Visit: Payer: Medicare HMO | Attending: Gynecologic Oncology | Admitting: Gynecologic Oncology

## 2016-10-24 ENCOUNTER — Ambulatory Visit (HOSPITAL_BASED_OUTPATIENT_CLINIC_OR_DEPARTMENT_OTHER): Payer: Medicare HMO

## 2016-10-24 VITALS — BP 124/61 | HR 67 | Temp 98.4°F | Resp 18

## 2016-10-24 DIAGNOSIS — Z7982 Long term (current) use of aspirin: Secondary | ICD-10-CM | POA: Insufficient documentation

## 2016-10-24 DIAGNOSIS — M4802 Spinal stenosis, cervical region: Secondary | ICD-10-CM | POA: Insufficient documentation

## 2016-10-24 DIAGNOSIS — K219 Gastro-esophageal reflux disease without esophagitis: Secondary | ICD-10-CM | POA: Insufficient documentation

## 2016-10-24 DIAGNOSIS — Z90710 Acquired absence of both cervix and uterus: Secondary | ICD-10-CM | POA: Diagnosis not present

## 2016-10-24 DIAGNOSIS — Z7902 Long term (current) use of antithrombotics/antiplatelets: Secondary | ICD-10-CM | POA: Insufficient documentation

## 2016-10-24 DIAGNOSIS — Z9049 Acquired absence of other specified parts of digestive tract: Secondary | ICD-10-CM | POA: Diagnosis not present

## 2016-10-24 DIAGNOSIS — Z86718 Personal history of other venous thrombosis and embolism: Secondary | ICD-10-CM | POA: Diagnosis not present

## 2016-10-24 DIAGNOSIS — Z794 Long term (current) use of insulin: Secondary | ICD-10-CM | POA: Insufficient documentation

## 2016-10-24 DIAGNOSIS — D62 Acute posthemorrhagic anemia: Secondary | ICD-10-CM | POA: Diagnosis not present

## 2016-10-24 DIAGNOSIS — E119 Type 2 diabetes mellitus without complications: Secondary | ICD-10-CM | POA: Insufficient documentation

## 2016-10-24 DIAGNOSIS — C541 Malignant neoplasm of endometrium: Secondary | ICD-10-CM | POA: Insufficient documentation

## 2016-10-24 DIAGNOSIS — Z8500 Personal history of malignant neoplasm of unspecified digestive organ: Secondary | ICD-10-CM | POA: Insufficient documentation

## 2016-10-24 DIAGNOSIS — Z9889 Other specified postprocedural states: Secondary | ICD-10-CM | POA: Diagnosis not present

## 2016-10-24 DIAGNOSIS — R42 Dizziness and giddiness: Secondary | ICD-10-CM

## 2016-10-24 DIAGNOSIS — Z90722 Acquired absence of ovaries, bilateral: Secondary | ICD-10-CM | POA: Diagnosis not present

## 2016-10-24 DIAGNOSIS — K859 Acute pancreatitis without necrosis or infection, unspecified: Secondary | ICD-10-CM | POA: Diagnosis not present

## 2016-10-24 DIAGNOSIS — I2510 Atherosclerotic heart disease of native coronary artery without angina pectoris: Secondary | ICD-10-CM | POA: Diagnosis not present

## 2016-10-24 LAB — CBC WITH DIFFERENTIAL/PLATELET
BASO%: 0.4 % (ref 0.0–2.0)
Basophils Absolute: 0 10*3/uL (ref 0.0–0.1)
EOS%: 3.9 % (ref 0.0–7.0)
Eosinophils Absolute: 0.2 10*3/uL (ref 0.0–0.5)
HCT: 36.1 % (ref 34.8–46.6)
HGB: 11.5 g/dL — ABNORMAL LOW (ref 11.6–15.9)
LYMPH%: 26.5 % (ref 14.0–49.7)
MCH: 29.9 pg (ref 25.1–34.0)
MCHC: 31.9 g/dL (ref 31.5–36.0)
MCV: 94 fL (ref 79.5–101.0)
MONO#: 0.4 10*3/uL (ref 0.1–0.9)
MONO%: 7.9 % (ref 0.0–14.0)
NEUT#: 3.3 10*3/uL (ref 1.5–6.5)
NEUT%: 61.3 % (ref 38.4–76.8)
Platelets: 240 10*3/uL (ref 145–400)
RBC: 3.84 10*6/uL (ref 3.70–5.45)
RDW: 15.5 % — ABNORMAL HIGH (ref 11.2–14.5)
WBC: 5.4 10*3/uL (ref 3.9–10.3)
lymph#: 1.4 10*3/uL (ref 0.9–3.3)

## 2016-10-24 NOTE — Progress Notes (Signed)
Consult Note: Gyn-Onc  Consult was requested by Dr. Landry Mellow for the evaluation of [name] 76 y.o. female  CC:  Chief Complaint  Patient presents with  . Endometrial Cancer    Assessment/Plan:  [name]  is a 76 y.o.  year old with stage IA grade 1 endometrioid endometrial cancer s/p robotic hysterectomy, BSO, SLN biopsy and lysis of adhesions with oversewing of small bowel on 10/02/16.  Pathology revealed low risk factors for recurrence, therefore no adjuvant therapy is recommended according to NCCN guidelines.  I discussed risk for recurrence and typical symptoms encouraged her to notify us of these should they develop between visits.  I recommend she have follow-up every 6 months for 5 years in accordance with NCCN guidelines. Those visits should include symptom assessment, physical exam and pelvic examination. Pap smears are not indicated or recommended in the routine surveillance of endometrial cancer.  I will see her back in 6 months and Dr Landry Mellow will see her back in 12 months.  Acute blood loss anemia and symptoms of dizziness: recheck Hb.  HPI: [name] is a 76 year old woman who is seen in consultation at the request of Dr Landry Mellow for grade 1 endometrioid endometrial adenocarcinoma. The patient reports experiencing postmenopausal bleeding since December, 2017. She saw Dr Landry Mellow and an [place] was performed on 08/14/16 and showed a uterus measuring 6.2x3.8x4.6cm with a thickened endometrium at 1.5cm. The ovaries were normal.  An endometrial biopsy was initially attempted but unsuccessful due to cervical stenosis. Repeat procedure with cytotec facilitated sampling in the office on 08/20/16 which showed grade 1 endometrial adenocarcinoma of the endometrium.  The patient's past medical history is significant for a duodenal GIST tumor diagnosed in 2012. For this she underwent a laparotomy and Whipple procedure with Dr Barry Dienes. Postoperatively her course was complicated  by a pancreatic leak requiring a drain placement. She also developed a postop DVT in the right lower extremity. This was treated with lovenox for 6 months. She is now on ASA 81 mg daily. She has been tested for familial hypercoagulopathy and is negative.  She has a personal hx of CAD (50% LAD occlusion), no stenting requiring in 2009. She was treated conservatively with plavix and ASA but taken off these when she experienced GI bleeding in 2009 (however, it appears this was due to her occult duodenal tumor). Her last stress test was within the year and was normal. She sees a cardiologist at [place].   She has had approximately 2 bouts of acute pancreatitis per year since her Whipple, the most serious was in February, 2018 for which she was hospitalized for 2 days. 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  Interval Hx: On 10/02/16 she underwent robotic assisted total hysterectomy, BSO and SLN biopsy with extensive lysis of adhesions and minilap for oversewing of small bowel deserosalization. Surgery was complicated by extensive upper abdominal adhesive disease. Final pathology revealed a 4cm grade 1 endometrioid endometrial tumor with 0.4 of 1.1cm myometrial invasion (inner halv), no LVSI, and negative SLN.  She was diagnosed with low risk (for recurrence) endometrial cancer and no adjuvant therapy was recommended in accordance with NCCN guidelines.  Postoperative she has done well overall. She had no GI complications. She has felt intermittently dizzy, particularly when shifting from supine to sitting. Or changing from standing to supine.  She has had minimal vaginal discharge.  Current Meds:  Outpatient Encounter Prescriptions as of 10/24/2016  Medication Sig  . cholecalciferol (VITAMIN D) 1000 UNITS tablet Take 1,000 Units by mouth daily.   . Coenzyme Q10 (COQ-10) 100 MG CAPS Take 100 mg by mouth daily.  Marland Kitchen  docusate sodium (COLACE) 100 MG capsule Take 100 mg by mouth daily.  Marland Kitchen ibuprofen (ADVIL,MOTRIN) 800 MG tablet Take 1 tablet (800 mg total) by mouth every 8 (eight) hours as needed (mild pain).  . insulin aspart (NOVOLOG) 100 UNIT/ML injection Inject into the skin See admin instructions. Sliding scale  For meal coverage  . insulin glargine (LANTUS) 100 UNIT/ML injection Inject 10 Units into the skin at bedtime.  . lipase/protease/amylase 24000-76000 units CPEP Take 1 capsule (24,000 Units total) by mouth 3 (three) times daily before meals.  . Melatonin 10 MG TABS Take 10 mg by mouth at bedtime.  . metoprolol (LOPRESSOR) 50 MG tablet Take 25-50 mg by mouth 2 (two) times daily. Take 50mg s in the morning and 25mg s in the evening  . nitroGLYCERIN (NITROSTAT) 0.4 MG SL tablet Place 0.4 mg under the tongue every 5 (five) minutes as needed for chest pain.  Marland Kitchen oxyCODONE (OXY IR/ROXICODONE) 5 MG immediate release tablet Take 1-2 tablets (5-10 mg total) by mouth every 6 (six) hours as needed for severe pain. (Patient taking differently: Take 5 mg by mouth every 6 (six) hours as needed for severe pain. )  . pantoprazole (PROTONIX) 40 MG tablet Take 40 mg by mouth daily before breakfast.   . polyethylene glycol (MIRALAX / GLYCOLAX) packet Take 17 g by mouth daily as needed for mild constipation.  . senna (SENOKOT) 8.6 MG TABS tablet Take 1 tablet (8.6 mg total) by mouth at bedtime.  . simvastatin (ZOCOR) 20 MG tablet Take 20 mg by mouth every evening.   . sucralfate (CARAFATE) 1 g tablet Take 1 g by mouth 2 (two) times daily.  . [DISCONTINUED] enoxaparin (LOVENOX) 40 MG/0.4ML injection Inject 0.4 mLs (40 mg total) into the skin daily.  . [DISCONTINUED] ondansetron (ZOFRAN ODT) 8 MG disintegrating tablet Take 1 tablet (8 mg total) by mouth every 8 (eight) hours as needed for nausea or vomiting.   No facility-administered encounter medications on file as of 10/24/2016.     Allergy: No Known Allergies  Social  Hx:   Social History   Social History  . Marital status: Widowed    Spouse name: N/A  . Number of children: N/A  . Years of education: N/A   Occupational History  . Not on file.   Social History Main Topics  . Smoking status: Never Smoker  . Smokeless tobacco: Never Used  . Alcohol use No  . Drug use: No  . Sexual activity: Not on file   Other Topics Concern  . Not on file   Social History Narrative   ** Merged History Encounter **        Past Surgical Hx:  Past Surgical History:  Procedure Laterality Date  . BREAST ENHANCEMENT SURGERY     since removed in 2016  . BREAST SURGERY    . CHOLECYSTECTOMY    . PANCREATICODUODENECTOMY  06/27/2010   whipple   . ROBOTIC ASSISTED TOTAL HYSTERECTOMY WITH BILATERAL SALPINGO OOPHERECTOMY Bilateral 10/02/2016   Procedure: XI ROBOTIC ASSISTED TOTAL HYSTERECTOMY WITH BILATERAL SALPINGO OOPHORECTOMY WITH SENTINAL LYMPH NODE BIOPSY LYSIS OF ADHESIONS, EXPLORATORY LAPAROTOMY;  Surgeon: Everitt Amber, MD;  Location: [facility];  Service: Gynecology;  Laterality: Bilateral;  . TUBAL LIGATION      Past  Medical Hx:  Past Medical History:  Diagnosis Date  . Cancer ([place])   . Diabetes mellitus without complication ([place])    pre-diabetic  . DVT of lower extremity (deep venous thrombosis) [facility]) March 2012  . GIST (gastrointestinal stromal tumor), malignant (HCC)    T2N0  . Reflux   . Ulcer    epigastric     Past Gynecological History:  SVD x 1 No LMP recorded. Patient is postmenopausal.  Family Hx: History reviewed. No pertinent family history.  Review of Systems:  Constitutional  Feels well,    ENT Normal appearing ears and nares bilaterally Skin/Breast  No rash, sores, jaundice, itching, dryness Cardiovascular  No chest pain, shortness of breath, or edema  Pulmonary  No cough or wheeze.  Gastro Intestinal  No nausea, vomitting, or diarrhoea. No bright red blood per rectum, change in bowel movement, or constipation. + intermittent  epigastric pain with bouts of pancreatitis Genito Urinary  No frequency, urgency, dysuria, no bleeding Musculo Skeletal  No myalgia, arthralgia, joint swelling or pain  Neurologic  No weakness, numbness, change in gait, + dizziness Psychology  No depression, anxiety, insomnia.   Vitals:  Blood pressure 124/61, pulse 67, temperature 98.4 F (36.9 C), temperature source Oral, resp. rate 18.  Physical Exam: WD in NAD Neck  Supple NROM, without any enlargements.  Lymph Node Survey No cervical supraclavicular or inguinal adenopathy Cardiovascular  Pulse normal rate, regularity and rhythm. S1 and S2 normal.  Lungs  Clear to auscultation bilateraly, without wheezes/crackles/rhonchi. Good air movement.  Skin  No rash/lesions/breakdown  Psychiatry  Alert and oriented to person, place, and time  Abdomen  Normoactive bowel sounds, abdomen soft, non-tender and nonobese without evidence of hernia. Minilap incision well healed. Laparoscopic sites in tact and healing normally. Nontender abdomen. Back No CVA tenderness Genito Urinary  Vaginal cuff healing well, in tact, no blood, no palpable hematomas. Rectal  deferred Extremities  No bilateral cyanosis, clubbing or edema.  Labs:   CBC    Component Value Date/Time   WBC 5.4 10/24/2016 1551   WBC 8.2 10/03/2016 0425   RBC 3.84 10/24/2016 1551   RBC 3.42 (L) 10/03/2016 0425   HGB 11.5 (L) 10/24/2016 1551   HCT 36.1 10/24/2016 1551   PLT 240 10/24/2016 1551   MCV 94.0 10/24/2016 1551   MCH 29.9 10/24/2016 1551   MCH 29.8 10/03/2016 0425   MCHC 31.9 10/24/2016 1551   MCHC 33.0 10/03/2016 0425   RDW 15.5 (H) 10/24/2016 1551   LYMPHSABS 1.4 10/24/2016 1551   MONOABS 0.4 10/24/2016 1551   EOSABS 0.2 10/24/2016 1551   BASOSABS 0.0 10/24/2016 1551    30 minutes of direct face to face counseling time was spent with the patient. This included discussion about prognosis, therapy recommendations and postoperative side effects and are  beyond the scope of routine postoperative care.    Donaciano Eva, MD  10/24/2016, 3:21 PM

## 2016-10-24 NOTE — Telephone Encounter (Signed)
LM for [name] that her Hgb was up from 3 weeks ago to 11.5.  Dr. Denman George said that she is mildly anemic and does not require further treatment. She can call the office if she has any questions or concerns.

## 2016-10-24 NOTE — Patient Instructions (Signed)
Follow up with Dr. Denman George  On April 22, 2017 as scheduled. Call Dr. Serita Grit office if you have any concerns or questions.

## 2016-10-29 NOTE — Anesthesia Postprocedure Evaluation (Signed)
Anesthesia Post Note  Patient: [name]  Procedure(s) Performed: Procedure(s) (LRB): XI ROBOTIC ASSISTED TOTAL HYSTERECTOMY WITH BILATERAL SALPINGO OOPHORECTOMY WITH SENTINAL LYMPH NODE BIOPSY LYSIS OF ADHESIONS, EXPLORATORY LAPAROTOMY (Bilateral)     Anesthesia Post Evaluation  Last Vitals:  Vitals:   10/03/16 0458 10/03/16 1004  BP: (!) 105/45 (!) 120/47  Pulse: 66 77  Resp: 17 17  Temp: 36.8 C 36.8 C    Last Pain:  Vitals:   10/03/16 1004  TempSrc: Oral  PainSc:                  Shaunna Rosetti S

## 2016-10-29 NOTE — Addendum Note (Signed)
Addendum  created 10/29/16 1428 by Myrtie Soman, MD   Sign clinical note

## 2016-11-05 DIAGNOSIS — Z8509 Personal history of malignant neoplasm of other digestive organs: Secondary | ICD-10-CM | POA: Diagnosis not present

## 2016-11-05 DIAGNOSIS — K8689 Other specified diseases of pancreas: Secondary | ICD-10-CM | POA: Diagnosis not present

## 2016-11-05 DIAGNOSIS — Z9049 Acquired absence of other specified parts of digestive tract: Secondary | ICD-10-CM | POA: Diagnosis not present

## 2016-11-05 DIAGNOSIS — K7689 Other specified diseases of liver: Secondary | ICD-10-CM | POA: Diagnosis not present

## 2016-12-19 DIAGNOSIS — D509 Iron deficiency anemia, unspecified: Secondary | ICD-10-CM | POA: Diagnosis not present

## 2016-12-19 DIAGNOSIS — E119 Type 2 diabetes mellitus without complications: Secondary | ICD-10-CM | POA: Diagnosis not present

## 2016-12-19 DIAGNOSIS — R928 Other abnormal and inconclusive findings on diagnostic imaging of breast: Secondary | ICD-10-CM | POA: Diagnosis not present

## 2016-12-19 DIAGNOSIS — I2510 Atherosclerotic heart disease of native coronary artery without angina pectoris: Secondary | ICD-10-CM | POA: Diagnosis not present

## 2016-12-19 DIAGNOSIS — K219 Gastro-esophageal reflux disease without esophagitis: Secondary | ICD-10-CM | POA: Diagnosis not present

## 2017-01-15 DIAGNOSIS — E119 Type 2 diabetes mellitus without complications: Secondary | ICD-10-CM | POA: Diagnosis not present

## 2017-01-15 DIAGNOSIS — H25813 Combined forms of age-related cataract, bilateral: Secondary | ICD-10-CM | POA: Diagnosis not present

## 2017-01-15 DIAGNOSIS — Z0100 Encounter for examination of eyes and vision without abnormal findings: Secondary | ICD-10-CM | POA: Diagnosis not present

## 2017-01-25 DIAGNOSIS — Z8542 Personal history of malignant neoplasm of other parts of uterus: Secondary | ICD-10-CM | POA: Diagnosis not present

## 2017-01-25 DIAGNOSIS — Z9189 Other specified personal risk factors, not elsewhere classified: Secondary | ICD-10-CM | POA: Diagnosis not present

## 2017-01-31 ENCOUNTER — Other Ambulatory Visit: Payer: Self-pay | Admitting: General Surgery

## 2017-01-31 DIAGNOSIS — N6489 Other specified disorders of breast: Secondary | ICD-10-CM

## 2017-02-05 ENCOUNTER — Ambulatory Visit
Admission: RE | Admit: 2017-02-05 | Discharge: 2017-02-05 | Disposition: A | Discharge status: Home / Self Care | Payer: Medicare HMO | Source: Ambulatory Visit | Attending: General Surgery | Admitting: General Surgery

## 2017-02-05 DIAGNOSIS — N6489 Other specified disorders of breast: Secondary | ICD-10-CM

## 2017-02-05 DIAGNOSIS — R928 Other abnormal and inconclusive findings on diagnostic imaging of breast: Secondary | ICD-10-CM | POA: Diagnosis not present

## 2017-03-20 DIAGNOSIS — Z794 Long term (current) use of insulin: Secondary | ICD-10-CM | POA: Diagnosis not present

## 2017-03-20 DIAGNOSIS — E1165 Type 2 diabetes mellitus with hyperglycemia: Secondary | ICD-10-CM | POA: Diagnosis not present

## 2017-03-22 DIAGNOSIS — E1165 Type 2 diabetes mellitus with hyperglycemia: Secondary | ICD-10-CM | POA: Diagnosis not present

## 2017-03-22 DIAGNOSIS — Z794 Long term (current) use of insulin: Secondary | ICD-10-CM | POA: Diagnosis not present

## 2017-04-22 ENCOUNTER — Ambulatory Visit: Payer: Medicare HMO | Admitting: Gynecologic Oncology

## 2017-12-16 ENCOUNTER — Other Ambulatory Visit: Payer: Self-pay | Admitting: Internal Medicine

## 2017-12-16 ENCOUNTER — Ambulatory Visit
Admission: RE | Admit: 2017-12-16 | Discharge: 2017-12-16 | Disposition: A | Discharge status: Home / Self Care | Payer: Medicare HMO | Source: Ambulatory Visit | Attending: Internal Medicine | Admitting: Internal Medicine

## 2017-12-16 DIAGNOSIS — Z1239 Encounter for other screening for malignant neoplasm of breast: Secondary | ICD-10-CM | POA: Diagnosis not present

## 2017-12-16 DIAGNOSIS — Z1231 Encounter for screening mammogram for malignant neoplasm of breast: Secondary | ICD-10-CM | POA: Diagnosis not present

## 2017-12-16 DIAGNOSIS — Z86718 Personal history of other venous thrombosis and embolism: Secondary | ICD-10-CM | POA: Diagnosis not present

## 2017-12-16 DIAGNOSIS — Z1389 Encounter for screening for other disorder: Secondary | ICD-10-CM | POA: Diagnosis not present

## 2017-12-16 DIAGNOSIS — I739 Peripheral vascular disease, unspecified: Secondary | ICD-10-CM | POA: Diagnosis not present

## 2017-12-16 DIAGNOSIS — K219 Gastro-esophageal reflux disease without esophagitis: Secondary | ICD-10-CM | POA: Diagnosis not present

## 2017-12-16 DIAGNOSIS — M810 Age-related osteoporosis without current pathological fracture: Secondary | ICD-10-CM | POA: Diagnosis not present

## 2017-12-16 DIAGNOSIS — I2510 Atherosclerotic heart disease of native coronary artery without angina pectoris: Secondary | ICD-10-CM | POA: Diagnosis not present

## 2017-12-16 DIAGNOSIS — Z0000 Encounter for general adult medical examination without abnormal findings: Secondary | ICD-10-CM | POA: Diagnosis not present

## 2017-12-16 DIAGNOSIS — E1165 Type 2 diabetes mellitus with hyperglycemia: Secondary | ICD-10-CM | POA: Diagnosis not present

## 2017-12-18 DIAGNOSIS — I10 Essential (primary) hypertension: Secondary | ICD-10-CM | POA: Diagnosis not present

## 2017-12-18 DIAGNOSIS — Z8542 Personal history of malignant neoplasm of other parts of uterus: Secondary | ICD-10-CM | POA: Diagnosis not present

## 2017-12-18 DIAGNOSIS — C541 Malignant neoplasm of endometrium: Secondary | ICD-10-CM | POA: Diagnosis not present

## 2017-12-18 DIAGNOSIS — E785 Hyperlipidemia, unspecified: Secondary | ICD-10-CM | POA: Diagnosis not present

## 2017-12-18 DIAGNOSIS — E119 Type 2 diabetes mellitus without complications: Secondary | ICD-10-CM | POA: Diagnosis not present

## 2017-12-18 DIAGNOSIS — I2510 Atherosclerotic heart disease of native coronary artery without angina pectoris: Secondary | ICD-10-CM | POA: Diagnosis not present

## 2017-12-18 DIAGNOSIS — M810 Age-related osteoporosis without current pathological fracture: Secondary | ICD-10-CM | POA: Diagnosis not present

## 2018-01-18 DIAGNOSIS — N3090 Cystitis, unspecified without hematuria: Secondary | ICD-10-CM | POA: Diagnosis not present

## 2018-01-22 DIAGNOSIS — I2510 Atherosclerotic heart disease of native coronary artery without angina pectoris: Secondary | ICD-10-CM | POA: Diagnosis not present

## 2018-01-22 DIAGNOSIS — E119 Type 2 diabetes mellitus without complications: Secondary | ICD-10-CM | POA: Diagnosis not present

## 2018-01-22 DIAGNOSIS — R300 Dysuria: Secondary | ICD-10-CM | POA: Diagnosis not present

## 2018-01-22 DIAGNOSIS — C541 Malignant neoplasm of endometrium: Secondary | ICD-10-CM | POA: Diagnosis not present

## 2018-01-22 DIAGNOSIS — D509 Iron deficiency anemia, unspecified: Secondary | ICD-10-CM | POA: Diagnosis not present

## 2018-01-22 DIAGNOSIS — Z8542 Personal history of malignant neoplasm of other parts of uterus: Secondary | ICD-10-CM | POA: Diagnosis not present

## 2018-01-22 DIAGNOSIS — M810 Age-related osteoporosis without current pathological fracture: Secondary | ICD-10-CM | POA: Diagnosis not present

## 2018-01-22 DIAGNOSIS — E1165 Type 2 diabetes mellitus with hyperglycemia: Secondary | ICD-10-CM | POA: Diagnosis not present

## 2018-01-22 DIAGNOSIS — Z794 Long term (current) use of insulin: Secondary | ICD-10-CM | POA: Diagnosis not present

## 2018-01-29 DIAGNOSIS — Z8542 Personal history of malignant neoplasm of other parts of uterus: Secondary | ICD-10-CM | POA: Diagnosis not present

## 2018-01-29 DIAGNOSIS — Z9189 Other specified personal risk factors, not elsewhere classified: Secondary | ICD-10-CM | POA: Diagnosis not present

## 2018-01-29 DIAGNOSIS — H25813 Combined forms of age-related cataract, bilateral: Secondary | ICD-10-CM | POA: Diagnosis not present

## 2018-01-29 DIAGNOSIS — E119 Type 2 diabetes mellitus without complications: Secondary | ICD-10-CM | POA: Diagnosis not present

## 2018-01-29 DIAGNOSIS — N898 Other specified noninflammatory disorders of vagina: Secondary | ICD-10-CM | POA: Diagnosis not present

## 2018-01-29 DIAGNOSIS — H43313 Vitreous membranes and strands, bilateral: Secondary | ICD-10-CM | POA: Diagnosis not present

## 2018-02-19 DIAGNOSIS — H02883 Meibomian gland dysfunction of right eye, unspecified eyelid: Secondary | ICD-10-CM | POA: Diagnosis not present

## 2018-02-19 DIAGNOSIS — H35371 Puckering of macula, right eye: Secondary | ICD-10-CM | POA: Diagnosis not present

## 2018-02-19 DIAGNOSIS — H04123 Dry eye syndrome of bilateral lacrimal glands: Secondary | ICD-10-CM | POA: Diagnosis not present

## 2018-02-19 DIAGNOSIS — H25813 Combined forms of age-related cataract, bilateral: Secondary | ICD-10-CM | POA: Diagnosis not present

## 2018-02-19 DIAGNOSIS — Z79899 Other long term (current) drug therapy: Secondary | ICD-10-CM | POA: Diagnosis not present

## 2018-02-19 DIAGNOSIS — H02886 Meibomian gland dysfunction of left eye, unspecified eyelid: Secondary | ICD-10-CM | POA: Diagnosis not present

## 2018-03-04 DIAGNOSIS — N898 Other specified noninflammatory disorders of vagina: Secondary | ICD-10-CM | POA: Diagnosis not present

## 2018-03-17 DIAGNOSIS — H35373 Puckering of macula, bilateral: Secondary | ICD-10-CM | POA: Diagnosis not present

## 2018-03-17 DIAGNOSIS — H35372 Puckering of macula, left eye: Secondary | ICD-10-CM | POA: Diagnosis not present

## 2018-03-17 DIAGNOSIS — E119 Type 2 diabetes mellitus without complications: Secondary | ICD-10-CM | POA: Diagnosis not present

## 2018-03-17 DIAGNOSIS — H25813 Combined forms of age-related cataract, bilateral: Secondary | ICD-10-CM | POA: Diagnosis not present

## 2018-03-17 DIAGNOSIS — Z794 Long term (current) use of insulin: Secondary | ICD-10-CM | POA: Diagnosis not present

## 2018-03-17 DIAGNOSIS — H35371 Puckering of macula, right eye: Secondary | ICD-10-CM | POA: Diagnosis not present

## 2018-03-17 DIAGNOSIS — H33312 Horseshoe tear of retina without detachment, left eye: Secondary | ICD-10-CM | POA: Diagnosis not present

## 2018-03-20 DIAGNOSIS — I2510 Atherosclerotic heart disease of native coronary artery without angina pectoris: Secondary | ICD-10-CM | POA: Diagnosis not present

## 2018-03-20 DIAGNOSIS — K219 Gastro-esophageal reflux disease without esophagitis: Secondary | ICD-10-CM | POA: Diagnosis not present

## 2018-03-20 DIAGNOSIS — E119 Type 2 diabetes mellitus without complications: Secondary | ICD-10-CM | POA: Diagnosis not present

## 2018-03-20 DIAGNOSIS — K8689 Other specified diseases of pancreas: Secondary | ICD-10-CM | POA: Diagnosis not present

## 2018-03-22 DIAGNOSIS — I2510 Atherosclerotic heart disease of native coronary artery without angina pectoris: Secondary | ICD-10-CM | POA: Diagnosis not present

## 2018-03-22 DIAGNOSIS — C541 Malignant neoplasm of endometrium: Secondary | ICD-10-CM | POA: Diagnosis not present

## 2018-03-22 DIAGNOSIS — E1165 Type 2 diabetes mellitus with hyperglycemia: Secondary | ICD-10-CM | POA: Diagnosis not present

## 2018-03-22 DIAGNOSIS — M810 Age-related osteoporosis without current pathological fracture: Secondary | ICD-10-CM | POA: Diagnosis not present

## 2018-03-22 DIAGNOSIS — D509 Iron deficiency anemia, unspecified: Secondary | ICD-10-CM | POA: Diagnosis not present

## 2018-03-22 DIAGNOSIS — E119 Type 2 diabetes mellitus without complications: Secondary | ICD-10-CM | POA: Diagnosis not present

## 2018-03-22 DIAGNOSIS — Z8542 Personal history of malignant neoplasm of other parts of uterus: Secondary | ICD-10-CM | POA: Diagnosis not present

## 2018-06-09 DIAGNOSIS — Z8542 Personal history of malignant neoplasm of other parts of uterus: Secondary | ICD-10-CM | POA: Diagnosis not present

## 2018-06-09 DIAGNOSIS — D509 Iron deficiency anemia, unspecified: Secondary | ICD-10-CM | POA: Diagnosis not present

## 2018-06-09 DIAGNOSIS — C541 Malignant neoplasm of endometrium: Secondary | ICD-10-CM | POA: Diagnosis not present

## 2018-06-09 DIAGNOSIS — E119 Type 2 diabetes mellitus without complications: Secondary | ICD-10-CM | POA: Diagnosis not present

## 2018-06-09 DIAGNOSIS — I2510 Atherosclerotic heart disease of native coronary artery without angina pectoris: Secondary | ICD-10-CM | POA: Diagnosis not present

## 2018-06-09 DIAGNOSIS — M810 Age-related osteoporosis without current pathological fracture: Secondary | ICD-10-CM | POA: Diagnosis not present

## 2018-06-09 DIAGNOSIS — E1165 Type 2 diabetes mellitus with hyperglycemia: Secondary | ICD-10-CM | POA: Diagnosis not present

## 2018-07-21 DIAGNOSIS — M810 Age-related osteoporosis without current pathological fracture: Secondary | ICD-10-CM | POA: Diagnosis not present

## 2018-07-21 DIAGNOSIS — I2510 Atherosclerotic heart disease of native coronary artery without angina pectoris: Secondary | ICD-10-CM | POA: Diagnosis not present

## 2018-07-21 DIAGNOSIS — K219 Gastro-esophageal reflux disease without esophagitis: Secondary | ICD-10-CM | POA: Diagnosis not present

## 2018-07-21 DIAGNOSIS — E1165 Type 2 diabetes mellitus with hyperglycemia: Secondary | ICD-10-CM | POA: Diagnosis not present

## 2018-07-21 DIAGNOSIS — E119 Type 2 diabetes mellitus without complications: Secondary | ICD-10-CM | POA: Diagnosis not present

## 2018-07-21 DIAGNOSIS — C541 Malignant neoplasm of endometrium: Secondary | ICD-10-CM | POA: Diagnosis not present

## 2018-07-21 DIAGNOSIS — Z8542 Personal history of malignant neoplasm of other parts of uterus: Secondary | ICD-10-CM | POA: Diagnosis not present

## 2018-07-21 DIAGNOSIS — D509 Iron deficiency anemia, unspecified: Secondary | ICD-10-CM | POA: Diagnosis not present

## 2018-07-21 DIAGNOSIS — I739 Peripheral vascular disease, unspecified: Secondary | ICD-10-CM | POA: Diagnosis not present

## 2018-08-18 DIAGNOSIS — E119 Type 2 diabetes mellitus without complications: Secondary | ICD-10-CM | POA: Diagnosis not present

## 2018-08-18 DIAGNOSIS — D509 Iron deficiency anemia, unspecified: Secondary | ICD-10-CM | POA: Diagnosis not present

## 2018-08-18 DIAGNOSIS — C541 Malignant neoplasm of endometrium: Secondary | ICD-10-CM | POA: Diagnosis not present

## 2018-08-18 DIAGNOSIS — Z8542 Personal history of malignant neoplasm of other parts of uterus: Secondary | ICD-10-CM | POA: Diagnosis not present

## 2018-08-18 DIAGNOSIS — E1165 Type 2 diabetes mellitus with hyperglycemia: Secondary | ICD-10-CM | POA: Diagnosis not present

## 2018-08-18 DIAGNOSIS — I2510 Atherosclerotic heart disease of native coronary artery without angina pectoris: Secondary | ICD-10-CM | POA: Diagnosis not present

## 2018-08-18 DIAGNOSIS — M810 Age-related osteoporosis without current pathological fracture: Secondary | ICD-10-CM | POA: Diagnosis not present

## 2018-09-25 DIAGNOSIS — E119 Type 2 diabetes mellitus without complications: Secondary | ICD-10-CM | POA: Diagnosis not present

## 2018-09-25 DIAGNOSIS — C541 Malignant neoplasm of endometrium: Secondary | ICD-10-CM | POA: Diagnosis not present

## 2018-09-25 DIAGNOSIS — I2510 Atherosclerotic heart disease of native coronary artery without angina pectoris: Secondary | ICD-10-CM | POA: Diagnosis not present

## 2018-09-25 DIAGNOSIS — M810 Age-related osteoporosis without current pathological fracture: Secondary | ICD-10-CM | POA: Diagnosis not present

## 2018-09-25 DIAGNOSIS — Z8542 Personal history of malignant neoplasm of other parts of uterus: Secondary | ICD-10-CM | POA: Diagnosis not present

## 2018-09-25 DIAGNOSIS — E1165 Type 2 diabetes mellitus with hyperglycemia: Secondary | ICD-10-CM | POA: Diagnosis not present

## 2018-09-25 DIAGNOSIS — D509 Iron deficiency anemia, unspecified: Secondary | ICD-10-CM | POA: Diagnosis not present

## 2018-10-13 ENCOUNTER — Other Ambulatory Visit: Payer: Self-pay

## 2018-10-13 NOTE — Patient Outreach (Signed)
[facility]) Care Management  10/13/2018  [name] Oct 16, 1940 [id number]    Late entry  2 attempts to outreach the patient in April.  No answer.  HIPAA compliant voicemail left with contact information.  Plan: RN Health Coach will send letter. [place] will make outreach attempt to the patient within thirty business days.   Lazaro Arms RN, BSN, [place] Direct Dial:  [phone]  Fax: [phone]

## 2018-10-14 DIAGNOSIS — D509 Iron deficiency anemia, unspecified: Secondary | ICD-10-CM | POA: Diagnosis not present

## 2018-10-14 DIAGNOSIS — I2510 Atherosclerotic heart disease of native coronary artery without angina pectoris: Secondary | ICD-10-CM | POA: Diagnosis not present

## 2018-10-14 DIAGNOSIS — Z8542 Personal history of malignant neoplasm of other parts of uterus: Secondary | ICD-10-CM | POA: Diagnosis not present

## 2018-10-14 DIAGNOSIS — C541 Malignant neoplasm of endometrium: Secondary | ICD-10-CM | POA: Diagnosis not present

## 2018-10-14 DIAGNOSIS — E119 Type 2 diabetes mellitus without complications: Secondary | ICD-10-CM | POA: Diagnosis not present

## 2018-10-14 DIAGNOSIS — M810 Age-related osteoporosis without current pathological fracture: Secondary | ICD-10-CM | POA: Diagnosis not present

## 2018-10-14 DIAGNOSIS — E1165 Type 2 diabetes mellitus with hyperglycemia: Secondary | ICD-10-CM | POA: Diagnosis not present

## 2018-12-03 ENCOUNTER — Other Ambulatory Visit: Payer: Self-pay

## 2018-12-03 NOTE — Patient Outreach (Signed)
[facility]) Care Management  12/03/2018  CHIANN GOFFREDO 10/30/1940 [id number]   Late entry.   RN Health Coach closing the program.  Patient is transitioning to external program [facility] for continued case management.  Lazaro Arms RN, BSN, [place] Direct Dial:  [phone]  Fax: [phone]

## 2018-12-22 DIAGNOSIS — C541 Malignant neoplasm of endometrium: Secondary | ICD-10-CM | POA: Diagnosis not present

## 2018-12-22 DIAGNOSIS — Z7984 Long term (current) use of oral hypoglycemic drugs: Secondary | ICD-10-CM | POA: Diagnosis not present

## 2018-12-22 DIAGNOSIS — E119 Type 2 diabetes mellitus without complications: Secondary | ICD-10-CM | POA: Diagnosis not present

## 2018-12-22 DIAGNOSIS — M810 Age-related osteoporosis without current pathological fracture: Secondary | ICD-10-CM | POA: Diagnosis not present

## 2018-12-22 DIAGNOSIS — Z8542 Personal history of malignant neoplasm of other parts of uterus: Secondary | ICD-10-CM | POA: Diagnosis not present

## 2018-12-22 DIAGNOSIS — E1165 Type 2 diabetes mellitus with hyperglycemia: Secondary | ICD-10-CM | POA: Diagnosis not present

## 2018-12-22 DIAGNOSIS — D509 Iron deficiency anemia, unspecified: Secondary | ICD-10-CM | POA: Diagnosis not present

## 2018-12-22 DIAGNOSIS — I2510 Atherosclerotic heart disease of native coronary artery without angina pectoris: Secondary | ICD-10-CM | POA: Diagnosis not present

## 2018-12-30 ENCOUNTER — Other Ambulatory Visit: Payer: Self-pay

## 2019-01-28 ENCOUNTER — Other Ambulatory Visit: Payer: Self-pay | Admitting: Dermatology

## 2019-01-28 DIAGNOSIS — D229 Melanocytic nevi, unspecified: Secondary | ICD-10-CM | POA: Diagnosis not present

## 2019-01-28 DIAGNOSIS — D485 Neoplasm of uncertain behavior of skin: Secondary | ICD-10-CM | POA: Diagnosis not present

## 2019-01-28 DIAGNOSIS — L570 Actinic keratosis: Secondary | ICD-10-CM | POA: Diagnosis not present

## 2019-01-28 DIAGNOSIS — R202 Paresthesia of skin: Secondary | ICD-10-CM | POA: Diagnosis not present

## 2019-01-28 DIAGNOSIS — C44622 Squamous cell carcinoma of skin of right upper limb, including shoulder: Secondary | ICD-10-CM | POA: Diagnosis not present

## 2019-02-16 DIAGNOSIS — D509 Iron deficiency anemia, unspecified: Secondary | ICD-10-CM | POA: Diagnosis not present

## 2019-02-16 DIAGNOSIS — E785 Hyperlipidemia, unspecified: Secondary | ICD-10-CM | POA: Diagnosis not present

## 2019-02-16 DIAGNOSIS — E119 Type 2 diabetes mellitus without complications: Secondary | ICD-10-CM | POA: Diagnosis not present

## 2019-02-16 DIAGNOSIS — E1169 Type 2 diabetes mellitus with other specified complication: Secondary | ICD-10-CM | POA: Diagnosis not present

## 2019-02-16 DIAGNOSIS — M810 Age-related osteoporosis without current pathological fracture: Secondary | ICD-10-CM | POA: Diagnosis not present

## 2019-02-16 DIAGNOSIS — Z8542 Personal history of malignant neoplasm of other parts of uterus: Secondary | ICD-10-CM | POA: Diagnosis not present

## 2019-02-16 DIAGNOSIS — E1165 Type 2 diabetes mellitus with hyperglycemia: Secondary | ICD-10-CM | POA: Diagnosis not present

## 2019-02-16 DIAGNOSIS — C541 Malignant neoplasm of endometrium: Secondary | ICD-10-CM | POA: Diagnosis not present

## 2019-02-17 DIAGNOSIS — M810 Age-related osteoporosis without current pathological fracture: Secondary | ICD-10-CM | POA: Diagnosis not present

## 2019-02-17 DIAGNOSIS — K219 Gastro-esophageal reflux disease without esophagitis: Secondary | ICD-10-CM | POA: Diagnosis not present

## 2019-02-17 DIAGNOSIS — E785 Hyperlipidemia, unspecified: Secondary | ICD-10-CM | POA: Diagnosis not present

## 2019-02-17 DIAGNOSIS — Z23 Encounter for immunization: Secondary | ICD-10-CM | POA: Diagnosis not present

## 2019-02-17 DIAGNOSIS — Z1231 Encounter for screening mammogram for malignant neoplasm of breast: Secondary | ICD-10-CM | POA: Diagnosis not present

## 2019-02-17 DIAGNOSIS — E1169 Type 2 diabetes mellitus with other specified complication: Secondary | ICD-10-CM | POA: Diagnosis not present

## 2019-02-17 DIAGNOSIS — I2510 Atherosclerotic heart disease of native coronary artery without angina pectoris: Secondary | ICD-10-CM | POA: Diagnosis not present

## 2019-03-09 ENCOUNTER — Other Ambulatory Visit: Payer: Self-pay | Admitting: Internal Medicine

## 2019-03-09 DIAGNOSIS — Z1231 Encounter for screening mammogram for malignant neoplasm of breast: Secondary | ICD-10-CM

## 2019-03-13 DIAGNOSIS — E1165 Type 2 diabetes mellitus with hyperglycemia: Secondary | ICD-10-CM | POA: Diagnosis not present

## 2019-03-13 DIAGNOSIS — D509 Iron deficiency anemia, unspecified: Secondary | ICD-10-CM | POA: Diagnosis not present

## 2019-03-13 DIAGNOSIS — E1169 Type 2 diabetes mellitus with other specified complication: Secondary | ICD-10-CM | POA: Diagnosis not present

## 2019-03-13 DIAGNOSIS — M810 Age-related osteoporosis without current pathological fracture: Secondary | ICD-10-CM | POA: Diagnosis not present

## 2019-03-13 DIAGNOSIS — C541 Malignant neoplasm of endometrium: Secondary | ICD-10-CM | POA: Diagnosis not present

## 2019-03-13 DIAGNOSIS — Z7984 Long term (current) use of oral hypoglycemic drugs: Secondary | ICD-10-CM | POA: Diagnosis not present

## 2019-03-13 DIAGNOSIS — Z8542 Personal history of malignant neoplasm of other parts of uterus: Secondary | ICD-10-CM | POA: Diagnosis not present

## 2019-03-13 DIAGNOSIS — E785 Hyperlipidemia, unspecified: Secondary | ICD-10-CM | POA: Diagnosis not present

## 2019-04-22 ENCOUNTER — Other Ambulatory Visit: Payer: Self-pay

## 2019-04-22 ENCOUNTER — Ambulatory Visit
Admission: RE | Admit: 2019-04-22 | Discharge: 2019-04-22 | Disposition: A | Discharge status: Home / Self Care | Payer: PPO | Source: Ambulatory Visit | Attending: Internal Medicine | Admitting: Internal Medicine

## 2019-04-22 DIAGNOSIS — C541 Malignant neoplasm of endometrium: Secondary | ICD-10-CM | POA: Diagnosis not present

## 2019-04-22 DIAGNOSIS — K219 Gastro-esophageal reflux disease without esophagitis: Secondary | ICD-10-CM | POA: Diagnosis not present

## 2019-04-22 DIAGNOSIS — Z1231 Encounter for screening mammogram for malignant neoplasm of breast: Secondary | ICD-10-CM

## 2019-04-22 DIAGNOSIS — Z0000 Encounter for general adult medical examination without abnormal findings: Secondary | ICD-10-CM | POA: Diagnosis not present

## 2019-04-22 DIAGNOSIS — M85859 Other specified disorders of bone density and structure, unspecified thigh: Secondary | ICD-10-CM | POA: Diagnosis not present

## 2019-04-22 DIAGNOSIS — Z1389 Encounter for screening for other disorder: Secondary | ICD-10-CM | POA: Diagnosis not present

## 2019-04-22 DIAGNOSIS — E785 Hyperlipidemia, unspecified: Secondary | ICD-10-CM | POA: Diagnosis not present

## 2019-04-22 DIAGNOSIS — E1169 Type 2 diabetes mellitus with other specified complication: Secondary | ICD-10-CM | POA: Diagnosis not present

## 2019-04-22 DIAGNOSIS — Z23 Encounter for immunization: Secondary | ICD-10-CM | POA: Diagnosis not present

## 2019-04-22 DIAGNOSIS — Z8542 Personal history of malignant neoplasm of other parts of uterus: Secondary | ICD-10-CM | POA: Diagnosis not present

## 2019-04-22 DIAGNOSIS — D509 Iron deficiency anemia, unspecified: Secondary | ICD-10-CM | POA: Diagnosis not present

## 2019-04-29 ENCOUNTER — Other Ambulatory Visit: Payer: Self-pay | Admitting: Internal Medicine

## 2019-04-29 DIAGNOSIS — M85859 Other specified disorders of bone density and structure, unspecified thigh: Secondary | ICD-10-CM

## 2019-05-26 DIAGNOSIS — Z8542 Personal history of malignant neoplasm of other parts of uterus: Secondary | ICD-10-CM | POA: Diagnosis not present

## 2019-05-26 DIAGNOSIS — D509 Iron deficiency anemia, unspecified: Secondary | ICD-10-CM | POA: Diagnosis not present

## 2019-05-26 DIAGNOSIS — C541 Malignant neoplasm of endometrium: Secondary | ICD-10-CM | POA: Diagnosis not present

## 2019-05-26 DIAGNOSIS — M810 Age-related osteoporosis without current pathological fracture: Secondary | ICD-10-CM | POA: Diagnosis not present

## 2019-05-26 DIAGNOSIS — E785 Hyperlipidemia, unspecified: Secondary | ICD-10-CM | POA: Diagnosis not present

## 2019-05-26 DIAGNOSIS — E1165 Type 2 diabetes mellitus with hyperglycemia: Secondary | ICD-10-CM | POA: Diagnosis not present

## 2019-05-26 DIAGNOSIS — E119 Type 2 diabetes mellitus without complications: Secondary | ICD-10-CM | POA: Diagnosis not present

## 2019-05-29 DIAGNOSIS — H269 Unspecified cataract: Secondary | ICD-10-CM

## 2019-05-29 HISTORY — DX: Unspecified cataract: H26.9

## 2019-05-29 HISTORY — PX: CATARACT EXTRACTION: SUR2

## 2019-06-03 DIAGNOSIS — H524 Presbyopia: Secondary | ICD-10-CM | POA: Diagnosis not present

## 2019-06-16 DIAGNOSIS — H35373 Puckering of macula, bilateral: Secondary | ICD-10-CM | POA: Diagnosis not present

## 2019-06-16 DIAGNOSIS — H25812 Combined forms of age-related cataract, left eye: Secondary | ICD-10-CM | POA: Diagnosis not present

## 2019-06-16 DIAGNOSIS — H25811 Combined forms of age-related cataract, right eye: Secondary | ICD-10-CM | POA: Diagnosis not present

## 2019-06-23 DIAGNOSIS — E1169 Type 2 diabetes mellitus with other specified complication: Secondary | ICD-10-CM | POA: Diagnosis not present

## 2019-06-23 DIAGNOSIS — M810 Age-related osteoporosis without current pathological fracture: Secondary | ICD-10-CM | POA: Diagnosis not present

## 2019-06-23 DIAGNOSIS — D509 Iron deficiency anemia, unspecified: Secondary | ICD-10-CM | POA: Diagnosis not present

## 2019-06-23 DIAGNOSIS — Z8542 Personal history of malignant neoplasm of other parts of uterus: Secondary | ICD-10-CM | POA: Diagnosis not present

## 2019-06-23 DIAGNOSIS — Z7984 Long term (current) use of oral hypoglycemic drugs: Secondary | ICD-10-CM | POA: Diagnosis not present

## 2019-06-23 DIAGNOSIS — C541 Malignant neoplasm of endometrium: Secondary | ICD-10-CM | POA: Diagnosis not present

## 2019-06-23 DIAGNOSIS — E119 Type 2 diabetes mellitus without complications: Secondary | ICD-10-CM | POA: Diagnosis not present

## 2019-06-23 DIAGNOSIS — E785 Hyperlipidemia, unspecified: Secondary | ICD-10-CM | POA: Diagnosis not present

## 2019-06-23 DIAGNOSIS — E1165 Type 2 diabetes mellitus with hyperglycemia: Secondary | ICD-10-CM | POA: Diagnosis not present

## 2019-07-03 DIAGNOSIS — Z01818 Encounter for other preprocedural examination: Secondary | ICD-10-CM | POA: Diagnosis not present

## 2019-07-06 DIAGNOSIS — H35373 Puckering of macula, bilateral: Secondary | ICD-10-CM | POA: Diagnosis not present

## 2019-07-06 DIAGNOSIS — E785 Hyperlipidemia, unspecified: Secondary | ICD-10-CM | POA: Diagnosis not present

## 2019-07-06 DIAGNOSIS — H25813 Combined forms of age-related cataract, bilateral: Secondary | ICD-10-CM | POA: Diagnosis not present

## 2019-07-06 DIAGNOSIS — Z79899 Other long term (current) drug therapy: Secondary | ICD-10-CM | POA: Diagnosis not present

## 2019-07-06 DIAGNOSIS — E1136 Type 2 diabetes mellitus with diabetic cataract: Secondary | ICD-10-CM | POA: Diagnosis not present

## 2019-07-06 DIAGNOSIS — I10 Essential (primary) hypertension: Secondary | ICD-10-CM | POA: Diagnosis not present

## 2019-07-06 DIAGNOSIS — H25811 Combined forms of age-related cataract, right eye: Secondary | ICD-10-CM | POA: Diagnosis not present

## 2019-07-06 DIAGNOSIS — Z7984 Long term (current) use of oral hypoglycemic drugs: Secondary | ICD-10-CM | POA: Diagnosis not present

## 2019-07-18 DIAGNOSIS — Z01818 Encounter for other preprocedural examination: Secondary | ICD-10-CM | POA: Diagnosis not present

## 2019-07-20 DIAGNOSIS — Z8541 Personal history of malignant neoplasm of cervix uteri: Secondary | ICD-10-CM | POA: Diagnosis not present

## 2019-07-20 DIAGNOSIS — Z7982 Long term (current) use of aspirin: Secondary | ICD-10-CM | POA: Diagnosis not present

## 2019-07-20 DIAGNOSIS — E785 Hyperlipidemia, unspecified: Secondary | ICD-10-CM | POA: Diagnosis not present

## 2019-07-20 DIAGNOSIS — H25812 Combined forms of age-related cataract, left eye: Secondary | ICD-10-CM | POA: Diagnosis not present

## 2019-07-20 DIAGNOSIS — I10 Essential (primary) hypertension: Secondary | ICD-10-CM | POA: Diagnosis not present

## 2019-07-20 DIAGNOSIS — E1136 Type 2 diabetes mellitus with diabetic cataract: Secondary | ICD-10-CM | POA: Diagnosis not present

## 2019-07-20 DIAGNOSIS — Z79899 Other long term (current) drug therapy: Secondary | ICD-10-CM | POA: Diagnosis not present

## 2019-07-20 DIAGNOSIS — H2512 Age-related nuclear cataract, left eye: Secondary | ICD-10-CM | POA: Diagnosis not present

## 2019-07-24 ENCOUNTER — Other Ambulatory Visit: Payer: PPO

## 2019-07-26 DIAGNOSIS — E1165 Type 2 diabetes mellitus with hyperglycemia: Secondary | ICD-10-CM | POA: Diagnosis not present

## 2019-07-26 DIAGNOSIS — D509 Iron deficiency anemia, unspecified: Secondary | ICD-10-CM | POA: Diagnosis not present

## 2019-07-26 DIAGNOSIS — E119 Type 2 diabetes mellitus without complications: Secondary | ICD-10-CM | POA: Diagnosis not present

## 2019-07-26 DIAGNOSIS — E1169 Type 2 diabetes mellitus with other specified complication: Secondary | ICD-10-CM | POA: Diagnosis not present

## 2019-07-26 DIAGNOSIS — E785 Hyperlipidemia, unspecified: Secondary | ICD-10-CM | POA: Diagnosis not present

## 2019-07-26 DIAGNOSIS — Z7984 Long term (current) use of oral hypoglycemic drugs: Secondary | ICD-10-CM | POA: Diagnosis not present

## 2019-07-26 DIAGNOSIS — Z8542 Personal history of malignant neoplasm of other parts of uterus: Secondary | ICD-10-CM | POA: Diagnosis not present

## 2019-07-26 DIAGNOSIS — C541 Malignant neoplasm of endometrium: Secondary | ICD-10-CM | POA: Diagnosis not present

## 2019-07-26 DIAGNOSIS — M810 Age-related osteoporosis without current pathological fracture: Secondary | ICD-10-CM | POA: Diagnosis not present

## 2019-08-19 DIAGNOSIS — H35353 Cystoid macular degeneration, bilateral: Secondary | ICD-10-CM | POA: Diagnosis not present

## 2019-08-19 DIAGNOSIS — H26493 Other secondary cataract, bilateral: Secondary | ICD-10-CM | POA: Diagnosis not present

## 2019-08-19 DIAGNOSIS — H35373 Puckering of macula, bilateral: Secondary | ICD-10-CM | POA: Diagnosis not present

## 2019-08-26 DIAGNOSIS — M810 Age-related osteoporosis without current pathological fracture: Secondary | ICD-10-CM | POA: Diagnosis not present

## 2019-08-26 DIAGNOSIS — E1169 Type 2 diabetes mellitus with other specified complication: Secondary | ICD-10-CM | POA: Diagnosis not present

## 2019-08-26 DIAGNOSIS — C541 Malignant neoplasm of endometrium: Secondary | ICD-10-CM | POA: Diagnosis not present

## 2019-08-26 DIAGNOSIS — E119 Type 2 diabetes mellitus without complications: Secondary | ICD-10-CM | POA: Diagnosis not present

## 2019-08-26 DIAGNOSIS — Z7984 Long term (current) use of oral hypoglycemic drugs: Secondary | ICD-10-CM | POA: Diagnosis not present

## 2019-08-26 DIAGNOSIS — E785 Hyperlipidemia, unspecified: Secondary | ICD-10-CM | POA: Diagnosis not present

## 2019-08-26 DIAGNOSIS — D509 Iron deficiency anemia, unspecified: Secondary | ICD-10-CM | POA: Diagnosis not present

## 2019-08-26 DIAGNOSIS — Z8542 Personal history of malignant neoplasm of other parts of uterus: Secondary | ICD-10-CM | POA: Diagnosis not present

## 2019-08-26 DIAGNOSIS — E1165 Type 2 diabetes mellitus with hyperglycemia: Secondary | ICD-10-CM | POA: Diagnosis not present

## 2019-09-11 DIAGNOSIS — H35353 Cystoid macular degeneration, bilateral: Secondary | ICD-10-CM | POA: Diagnosis not present

## 2019-09-11 DIAGNOSIS — H35373 Puckering of macula, bilateral: Secondary | ICD-10-CM | POA: Diagnosis not present

## 2019-09-11 DIAGNOSIS — H26493 Other secondary cataract, bilateral: Secondary | ICD-10-CM | POA: Diagnosis not present

## 2019-09-23 DIAGNOSIS — Z8542 Personal history of malignant neoplasm of other parts of uterus: Secondary | ICD-10-CM | POA: Diagnosis not present

## 2019-09-23 DIAGNOSIS — Z7984 Long term (current) use of oral hypoglycemic drugs: Secondary | ICD-10-CM | POA: Diagnosis not present

## 2019-09-23 DIAGNOSIS — M810 Age-related osteoporosis without current pathological fracture: Secondary | ICD-10-CM | POA: Diagnosis not present

## 2019-09-23 DIAGNOSIS — E1165 Type 2 diabetes mellitus with hyperglycemia: Secondary | ICD-10-CM | POA: Diagnosis not present

## 2019-09-23 DIAGNOSIS — E119 Type 2 diabetes mellitus without complications: Secondary | ICD-10-CM | POA: Diagnosis not present

## 2019-09-23 DIAGNOSIS — D509 Iron deficiency anemia, unspecified: Secondary | ICD-10-CM | POA: Diagnosis not present

## 2019-09-23 DIAGNOSIS — C541 Malignant neoplasm of endometrium: Secondary | ICD-10-CM | POA: Diagnosis not present

## 2019-09-23 DIAGNOSIS — E785 Hyperlipidemia, unspecified: Secondary | ICD-10-CM | POA: Diagnosis not present

## 2019-09-23 DIAGNOSIS — E1169 Type 2 diabetes mellitus with other specified complication: Secondary | ICD-10-CM | POA: Diagnosis not present

## 2019-10-21 DIAGNOSIS — E1165 Type 2 diabetes mellitus with hyperglycemia: Secondary | ICD-10-CM | POA: Diagnosis not present

## 2019-10-21 DIAGNOSIS — M810 Age-related osteoporosis without current pathological fracture: Secondary | ICD-10-CM | POA: Diagnosis not present

## 2019-10-21 DIAGNOSIS — E1169 Type 2 diabetes mellitus with other specified complication: Secondary | ICD-10-CM | POA: Diagnosis not present

## 2019-10-21 DIAGNOSIS — D509 Iron deficiency anemia, unspecified: Secondary | ICD-10-CM | POA: Diagnosis not present

## 2019-10-21 DIAGNOSIS — Z8542 Personal history of malignant neoplasm of other parts of uterus: Secondary | ICD-10-CM | POA: Diagnosis not present

## 2019-10-21 DIAGNOSIS — C541 Malignant neoplasm of endometrium: Secondary | ICD-10-CM | POA: Diagnosis not present

## 2019-10-21 DIAGNOSIS — E785 Hyperlipidemia, unspecified: Secondary | ICD-10-CM | POA: Diagnosis not present

## 2019-10-21 DIAGNOSIS — E119 Type 2 diabetes mellitus without complications: Secondary | ICD-10-CM | POA: Diagnosis not present

## 2019-12-25 DIAGNOSIS — E785 Hyperlipidemia, unspecified: Secondary | ICD-10-CM | POA: Diagnosis not present

## 2019-12-25 DIAGNOSIS — M810 Age-related osteoporosis without current pathological fracture: Secondary | ICD-10-CM | POA: Diagnosis not present

## 2019-12-25 DIAGNOSIS — D509 Iron deficiency anemia, unspecified: Secondary | ICD-10-CM | POA: Diagnosis not present

## 2019-12-25 DIAGNOSIS — E1165 Type 2 diabetes mellitus with hyperglycemia: Secondary | ICD-10-CM | POA: Diagnosis not present

## 2019-12-25 DIAGNOSIS — E119 Type 2 diabetes mellitus without complications: Secondary | ICD-10-CM | POA: Diagnosis not present

## 2019-12-25 DIAGNOSIS — E1169 Type 2 diabetes mellitus with other specified complication: Secondary | ICD-10-CM | POA: Diagnosis not present

## 2019-12-25 DIAGNOSIS — C541 Malignant neoplasm of endometrium: Secondary | ICD-10-CM | POA: Diagnosis not present

## 2019-12-25 DIAGNOSIS — Z8542 Personal history of malignant neoplasm of other parts of uterus: Secondary | ICD-10-CM | POA: Diagnosis not present

## 2020-01-20 DIAGNOSIS — D509 Iron deficiency anemia, unspecified: Secondary | ICD-10-CM | POA: Diagnosis not present

## 2020-01-20 DIAGNOSIS — E1165 Type 2 diabetes mellitus with hyperglycemia: Secondary | ICD-10-CM | POA: Diagnosis not present

## 2020-01-20 DIAGNOSIS — Z8542 Personal history of malignant neoplasm of other parts of uterus: Secondary | ICD-10-CM | POA: Diagnosis not present

## 2020-01-20 DIAGNOSIS — M810 Age-related osteoporosis without current pathological fracture: Secondary | ICD-10-CM | POA: Diagnosis not present

## 2020-01-20 DIAGNOSIS — E1169 Type 2 diabetes mellitus with other specified complication: Secondary | ICD-10-CM | POA: Diagnosis not present

## 2020-01-20 DIAGNOSIS — E785 Hyperlipidemia, unspecified: Secondary | ICD-10-CM | POA: Diagnosis not present

## 2020-01-20 DIAGNOSIS — C541 Malignant neoplasm of endometrium: Secondary | ICD-10-CM | POA: Diagnosis not present

## 2020-01-20 DIAGNOSIS — E119 Type 2 diabetes mellitus without complications: Secondary | ICD-10-CM | POA: Diagnosis not present

## 2020-02-20 DIAGNOSIS — Z8542 Personal history of malignant neoplasm of other parts of uterus: Secondary | ICD-10-CM | POA: Diagnosis not present

## 2020-02-20 DIAGNOSIS — M810 Age-related osteoporosis without current pathological fracture: Secondary | ICD-10-CM | POA: Diagnosis not present

## 2020-02-20 DIAGNOSIS — E785 Hyperlipidemia, unspecified: Secondary | ICD-10-CM | POA: Diagnosis not present

## 2020-02-20 DIAGNOSIS — E1169 Type 2 diabetes mellitus with other specified complication: Secondary | ICD-10-CM | POA: Diagnosis not present

## 2020-02-20 DIAGNOSIS — D509 Iron deficiency anemia, unspecified: Secondary | ICD-10-CM | POA: Diagnosis not present

## 2020-02-20 DIAGNOSIS — E119 Type 2 diabetes mellitus without complications: Secondary | ICD-10-CM | POA: Diagnosis not present

## 2020-02-20 DIAGNOSIS — E1165 Type 2 diabetes mellitus with hyperglycemia: Secondary | ICD-10-CM | POA: Diagnosis not present

## 2020-02-20 DIAGNOSIS — C541 Malignant neoplasm of endometrium: Secondary | ICD-10-CM | POA: Diagnosis not present

## 2020-03-16 DIAGNOSIS — H35353 Cystoid macular degeneration, bilateral: Secondary | ICD-10-CM | POA: Diagnosis not present

## 2020-03-16 DIAGNOSIS — H35373 Puckering of macula, bilateral: Secondary | ICD-10-CM | POA: Diagnosis not present

## 2020-03-16 DIAGNOSIS — H26493 Other secondary cataract, bilateral: Secondary | ICD-10-CM | POA: Diagnosis not present

## 2020-04-18 DIAGNOSIS — C541 Malignant neoplasm of endometrium: Secondary | ICD-10-CM | POA: Diagnosis not present

## 2020-04-18 DIAGNOSIS — E1169 Type 2 diabetes mellitus with other specified complication: Secondary | ICD-10-CM | POA: Diagnosis not present

## 2020-04-18 DIAGNOSIS — E1165 Type 2 diabetes mellitus with hyperglycemia: Secondary | ICD-10-CM | POA: Diagnosis not present

## 2020-04-18 DIAGNOSIS — Z8542 Personal history of malignant neoplasm of other parts of uterus: Secondary | ICD-10-CM | POA: Diagnosis not present

## 2020-04-18 DIAGNOSIS — E119 Type 2 diabetes mellitus without complications: Secondary | ICD-10-CM | POA: Diagnosis not present

## 2020-04-18 DIAGNOSIS — D509 Iron deficiency anemia, unspecified: Secondary | ICD-10-CM | POA: Diagnosis not present

## 2020-04-18 DIAGNOSIS — E785 Hyperlipidemia, unspecified: Secondary | ICD-10-CM | POA: Diagnosis not present

## 2020-04-18 DIAGNOSIS — G4700 Insomnia, unspecified: Secondary | ICD-10-CM | POA: Diagnosis not present

## 2020-04-18 DIAGNOSIS — M810 Age-related osteoporosis without current pathological fracture: Secondary | ICD-10-CM | POA: Diagnosis not present

## 2020-04-25 ENCOUNTER — Other Ambulatory Visit: Payer: Self-pay | Admitting: Internal Medicine

## 2020-04-25 DIAGNOSIS — Z1231 Encounter for screening mammogram for malignant neoplasm of breast: Secondary | ICD-10-CM

## 2020-05-03 ENCOUNTER — Ambulatory Visit
Admission: RE | Admit: 2020-05-03 | Discharge: 2020-05-03 | Disposition: A | Discharge status: Home / Self Care | Payer: PPO | Source: Ambulatory Visit | Attending: Internal Medicine | Admitting: Internal Medicine

## 2020-05-03 ENCOUNTER — Other Ambulatory Visit: Payer: Self-pay

## 2020-05-03 DIAGNOSIS — F32A Depression, unspecified: Secondary | ICD-10-CM | POA: Diagnosis not present

## 2020-05-03 DIAGNOSIS — N939 Abnormal uterine and vaginal bleeding, unspecified: Secondary | ICD-10-CM | POA: Diagnosis not present

## 2020-05-03 DIAGNOSIS — Z86711 Personal history of pulmonary embolism: Secondary | ICD-10-CM | POA: Diagnosis not present

## 2020-05-03 DIAGNOSIS — Z86718 Personal history of other venous thrombosis and embolism: Secondary | ICD-10-CM | POA: Diagnosis not present

## 2020-05-03 DIAGNOSIS — Z7984 Long term (current) use of oral hypoglycemic drugs: Secondary | ICD-10-CM | POA: Diagnosis not present

## 2020-05-03 DIAGNOSIS — C49A9 Gastrointestinal stromal tumor of other sites: Secondary | ICD-10-CM | POA: Diagnosis not present

## 2020-05-03 DIAGNOSIS — Z23 Encounter for immunization: Secondary | ICD-10-CM | POA: Diagnosis not present

## 2020-05-03 DIAGNOSIS — G2581 Restless legs syndrome: Secondary | ICD-10-CM | POA: Diagnosis not present

## 2020-05-03 DIAGNOSIS — Z1231 Encounter for screening mammogram for malignant neoplasm of breast: Secondary | ICD-10-CM | POA: Diagnosis not present

## 2020-05-03 DIAGNOSIS — C55 Malignant neoplasm of uterus, part unspecified: Secondary | ICD-10-CM | POA: Diagnosis not present

## 2020-05-03 DIAGNOSIS — E1165 Type 2 diabetes mellitus with hyperglycemia: Secondary | ICD-10-CM | POA: Diagnosis not present

## 2020-05-03 DIAGNOSIS — Z0000 Encounter for general adult medical examination without abnormal findings: Secondary | ICD-10-CM | POA: Diagnosis not present

## 2020-05-03 DIAGNOSIS — N952 Postmenopausal atrophic vaginitis: Secondary | ICD-10-CM | POA: Diagnosis not present

## 2020-05-06 ENCOUNTER — Telehealth: Payer: Self-pay | Admitting: *Deleted

## 2020-05-06 DIAGNOSIS — N898 Other specified noninflammatory disorders of vagina: Secondary | ICD-10-CM | POA: Diagnosis not present

## 2020-05-06 NOTE — Telephone Encounter (Signed)
Called, spoke with the patient and scheduled a new patient appt on 12/17 at 10:30 am with Dr Denman George. Patient given the address and phone number for the clinic. Patient also given the policy for mask and visitors

## 2020-05-09 ENCOUNTER — Encounter: Payer: Self-pay | Admitting: Gynecologic Oncology

## 2020-05-09 ENCOUNTER — Telehealth: Payer: Self-pay

## 2020-05-09 DIAGNOSIS — C801 Malignant (primary) neoplasm, unspecified: Secondary | ICD-10-CM | POA: Insufficient documentation

## 2020-05-09 NOTE — Telephone Encounter (Signed)
Ms Romana Juniper

## 2020-05-09 NOTE — Telephone Encounter (Signed)
Ms Faraci was calling to see what would be done at visit Friday with Dr. Denman George. Told her that Dr. Denman George would probably attempt an internal visit. Ms Bayon stated that she had 2 attempts last week na d they were excruciating even with a small speculum. She also had some significant vaginal bleeding after exam. Told her that she can discuss this with Dr. Denman George at [place]  And see what she recommends. She can always refuse the internal exam. Pt verbalized understanding.

## 2020-05-11 DIAGNOSIS — C541 Malignant neoplasm of endometrium: Secondary | ICD-10-CM | POA: Diagnosis not present

## 2020-05-11 DIAGNOSIS — E1169 Type 2 diabetes mellitus with other specified complication: Secondary | ICD-10-CM | POA: Diagnosis not present

## 2020-05-11 DIAGNOSIS — E119 Type 2 diabetes mellitus without complications: Secondary | ICD-10-CM | POA: Diagnosis not present

## 2020-05-11 DIAGNOSIS — Z8542 Personal history of malignant neoplasm of other parts of uterus: Secondary | ICD-10-CM | POA: Diagnosis not present

## 2020-05-11 DIAGNOSIS — E785 Hyperlipidemia, unspecified: Secondary | ICD-10-CM | POA: Diagnosis not present

## 2020-05-11 DIAGNOSIS — G4700 Insomnia, unspecified: Secondary | ICD-10-CM | POA: Diagnosis not present

## 2020-05-11 DIAGNOSIS — E1165 Type 2 diabetes mellitus with hyperglycemia: Secondary | ICD-10-CM | POA: Diagnosis not present

## 2020-05-11 DIAGNOSIS — M810 Age-related osteoporosis without current pathological fracture: Secondary | ICD-10-CM | POA: Diagnosis not present

## 2020-05-11 DIAGNOSIS — D509 Iron deficiency anemia, unspecified: Secondary | ICD-10-CM | POA: Diagnosis not present

## 2020-05-12 ENCOUNTER — Encounter: Payer: Self-pay | Admitting: Gynecologic Oncology

## 2020-05-13 ENCOUNTER — Inpatient Hospital Stay: Payer: PPO | Attending: Gynecologic Oncology | Admitting: Gynecologic Oncology

## 2020-05-13 ENCOUNTER — Other Ambulatory Visit: Payer: Self-pay

## 2020-05-13 ENCOUNTER — Encounter: Payer: Self-pay | Admitting: Oncology

## 2020-05-13 VITALS — BP 145/51 | HR 57 | Temp 97.8°F | Resp 16 | Ht 65.0 in | Wt 136.8 lb

## 2020-05-13 DIAGNOSIS — M8580 Other specified disorders of bone density and structure, unspecified site: Secondary | ICD-10-CM | POA: Diagnosis not present

## 2020-05-13 DIAGNOSIS — E785 Hyperlipidemia, unspecified: Secondary | ICD-10-CM | POA: Diagnosis not present

## 2020-05-13 DIAGNOSIS — Z8542 Personal history of malignant neoplasm of other parts of uterus: Secondary | ICD-10-CM | POA: Insufficient documentation

## 2020-05-13 DIAGNOSIS — D398 Neoplasm of uncertain behavior of other specified female genital organs: Secondary | ICD-10-CM | POA: Insufficient documentation

## 2020-05-13 DIAGNOSIS — Z90722 Acquired absence of ovaries, bilateral: Secondary | ICD-10-CM | POA: Diagnosis not present

## 2020-05-13 DIAGNOSIS — E7800 Pure hypercholesterolemia, unspecified: Secondary | ICD-10-CM | POA: Insufficient documentation

## 2020-05-13 DIAGNOSIS — Z79899 Other long term (current) drug therapy: Secondary | ICD-10-CM | POA: Insufficient documentation

## 2020-05-13 DIAGNOSIS — Z90710 Acquired absence of both cervix and uterus: Secondary | ICD-10-CM | POA: Insufficient documentation

## 2020-05-13 DIAGNOSIS — E1136 Type 2 diabetes mellitus with diabetic cataract: Secondary | ICD-10-CM | POA: Insufficient documentation

## 2020-05-13 DIAGNOSIS — Z7982 Long term (current) use of aspirin: Secondary | ICD-10-CM | POA: Insufficient documentation

## 2020-05-13 DIAGNOSIS — I10 Essential (primary) hypertension: Secondary | ICD-10-CM | POA: Insufficient documentation

## 2020-05-13 DIAGNOSIS — I2510 Atherosclerotic heart disease of native coronary artery without angina pectoris: Secondary | ICD-10-CM | POA: Insufficient documentation

## 2020-05-13 DIAGNOSIS — Z86718 Personal history of other venous thrombosis and embolism: Secondary | ICD-10-CM | POA: Diagnosis not present

## 2020-05-13 DIAGNOSIS — C7982 Secondary malignant neoplasm of genital organs: Secondary | ICD-10-CM | POA: Insufficient documentation

## 2020-05-13 DIAGNOSIS — C541 Malignant neoplasm of endometrium: Secondary | ICD-10-CM | POA: Diagnosis not present

## 2020-05-13 DIAGNOSIS — C801 Malignant (primary) neoplasm, unspecified: Secondary | ICD-10-CM

## 2020-05-13 DIAGNOSIS — N898 Other specified noninflammatory disorders of vagina: Secondary | ICD-10-CM

## 2020-05-13 DIAGNOSIS — Z923 Personal history of irradiation: Secondary | ICD-10-CM | POA: Insufficient documentation

## 2020-05-13 DIAGNOSIS — C52 Malignant neoplasm of vagina: Secondary | ICD-10-CM | POA: Diagnosis not present

## 2020-05-13 NOTE — Progress Notes (Signed)
Requested MMR testing on accession [id number] with [facility] via email.

## 2020-05-13 NOTE — Progress Notes (Signed)
Consult Note: Gyn-Onc  Consult was requested by Dr. Delora Fuel for the evaluation of [name] 79 y.o. female  CC:  Chief Complaint  Patient presents with  . Vaginal mass  . Endometrial adenocarcinoma    Assessment/Plan:  [name]  is a 79 y.o.  year old with concern for recurrent endometrial cancer at the vaginal cuff and posterior vaginal wall.  I explained to [name] that my exam findings were concerning for recurrent endometrial cancer.  I explained that we will need to follow-up the results of the biopsy.  Exam was somewhat limited today due to discomfort.  Therefore if the biopsy is nondiagnostic, she will require an examination under anesthesia biopsy of the vagina for more definitive diagnosis.  Should today's biopsy demonstrate recurrent endometrial cancer, the neck strategy will be imaging with CT chest abdomen and pelvis or PET/CT.  If disease is localized to the vaginal cuff and pelvis, she would be a candidate for definitive salvage external beam radiation with consideration for brachytherapy.  If imaging revealed disseminated disease she would require consideration for systemic salvage therapies.   HPI: [name] is a 79 year old woman with a history of stage IA grade 1 endometrial cancer in 2018who was seen in consultation at the request of Dr Delora Fuel for evaluation of a vaginal mass.  The patient underwent surgical staging for her endometrial cancer in May 2018.  This included a robotic total hysterectomy with BSO and sentinel lymph node biopsy.  Her surgery was complicated by extensive adhesions from her prior Whipple procedure.  Final pathology revealed a grade 1 endometrioid endometrial adenocarcinoma that measured 4 cm.  There was inner half myometrial invasion and negative sentinel lymph nodes with no lymphovascular space invasion.  She was characterizes having low risk endometrial cancer and therefore no adjuvant therapy was recommended  in accordance with NCCN guidelines. MMR testing was not performed at that time.   She received subsequent follow-up with her benign gynecologist and was lost to follow-up with [place].  In early December 2021 she began experiencing postmenopausal bleeding.  She reported this to her primary care doctor who attempted speculum exam which was unsuccessful due to discomfort.  The patient was then seen by Dr. Delora Fuel who performed a limited speculum exam due to patient discomfort and recognized a vaginal mass.  She subsequently referred the patient to see GYN oncology.  Current Meds:  Outpatient Encounter Medications as of 05/13/2020  Medication Sig  . Apoaequorin (PREVAGEN PO) Take 1 tablet by mouth daily.  . cholecalciferol (VITAMIN D) 1000 UNITS tablet Take 1,000 Units by mouth daily.   . Coenzyme Q10 (COQ-10) 100 MG CAPS Take 100 mg by mouth daily.  . empagliflozin (JARDIANCE) 10 MG TABS tablet Take 1 tablet by mouth daily.  . lipase/protease/amylase 24000-76000 units CPEP Take 1 capsule (24,000 Units total) by mouth 3 (three) times daily before meals. (Patient taking differently: Take 24,000 Units by mouth 2 (two) times daily with a meal.)  . Melatonin 10 MG TABS Take 10 mg by mouth at bedtime as needed.  . metFORMIN (GLUCOPHAGE-XR) 500 MG 24 hr tablet Take 2 tablets by mouth at bedtime.  . metoprolol succinate (TOPROL-XL) 25 MG 24 hr tablet Take 1 tablet by mouth 2 (two) times daily.  . simvastatin (ZOCOR) 20 MG tablet Take 20 mg by mouth every evening.  . sucralfate (CARAFATE) 1 g tablet Take 1 g by mouth 2 (two) times daily.  . vitamin E 180 MG (400  UNITS) capsule Take 400 Units by mouth daily.  Marland Kitchen aspirin 81 MG chewable tablet Chew 1 tablet by mouth daily. (Patient not taking: Reported on 05/12/2020)  . nitroGLYCERIN (NITROSTAT) 0.4 MG SL tablet Place 0.4 mg under the tongue every 5 (five) minutes as needed for chest pain. (Patient not taking: Reported on 05/12/2020)  . [DISCONTINUED] docusate  sodium (COLACE) 100 MG capsule Take 100 mg by mouth daily.  . [DISCONTINUED] ibuprofen (ADVIL,MOTRIN) 800 MG tablet Take 1 tablet (800 mg total) by mouth every 8 (eight) hours as needed (mild pain).  . [DISCONTINUED] insulin aspart (NOVOLOG) 100 UNIT/ML injection Inject into the skin See admin instructions. Sliding scale  For meal coverage  . [DISCONTINUED] insulin glargine (LANTUS) 100 UNIT/ML injection Inject 10 Units into the skin at bedtime.  . [DISCONTINUED] metoprolol (LOPRESSOR) 50 MG tablet   . [DISCONTINUED] oxyCODONE (OXY IR/ROXICODONE) 5 MG immediate release tablet Take 1-2 tablets (5-10 mg total) by mouth every 6 (six) hours as needed for severe pain. (Patient taking differently: Take 5 mg by mouth every 6 (six) hours as needed for severe pain. )  . [DISCONTINUED] pantoprazole (PROTONIX) 40 MG tablet Take 40 mg by mouth daily before breakfast.   . [DISCONTINUED] polyethylene glycol (MIRALAX / GLYCOLAX) packet Take 17 g by mouth daily as needed for mild constipation.  . [DISCONTINUED] senna (SENOKOT) 8.6 MG TABS tablet Take 1 tablet (8.6 mg total) by mouth at bedtime.   No facility-administered encounter medications on file as of 05/13/2020.    Allergy:  Allergies  Allergen Reactions  . Ibuprofen Hives    Social Hx:   Social History   Socioeconomic History  . Marital status: Widowed    Spouse name: Not on file  . Number of children: 1  . Years of education: Not on file  . Highest education level: Not on file  Occupational History  . Occupation: Press photographer    Comment: retired  Tobacco Use  . Smoking status: Never Smoker  . Smokeless tobacco: Never Used  Substance and Sexual Activity  . Alcohol use: No  . Drug use: No  . Sexual activity: Not on file  Other Topics Concern  . Not on file  Social History Narrative   ** Merged History Encounter **       Social Determinants of Health   Financial Resource Strain: Not on file  Food Insecurity: Not on file   Transportation Needs: Not on file  Physical Activity: Not on file  Stress: Not on file  Social Connections: Not on file  Intimate Partner Violence: Not on file    Past Surgical Hx:  Past Surgical History:  Procedure Laterality Date  . ABDOMINAL HYSTERECTOMY    . AUGMENTATION MAMMAPLASTY    . BREAST ENHANCEMENT SURGERY     since removed in 2016  . BREAST SURGERY    . CARDIAC CATHETERIZATION    . CATARACT EXTRACTION Bilateral 2021  . CHOLECYSTECTOMY    . PANCREATICODUODENECTOMY  06/27/2010   whipple   . ROBOTIC ASSISTED TOTAL HYSTERECTOMY WITH BILATERAL SALPINGO OOPHERECTOMY Bilateral 10/02/2016   Procedure: XI ROBOTIC ASSISTED TOTAL HYSTERECTOMY WITH BILATERAL SALPINGO OOPHORECTOMY WITH SENTINAL LYMPH NODE BIOPSY LYSIS OF ADHESIONS, EXPLORATORY LAPAROTOMY;  Surgeon: Everitt Amber, MD;  Location: [facility];  Service: Gynecology;  Laterality: Bilateral;  . TUBAL LIGATION    . WHIPPLE PROCEDURE  2012    Past Medical Hx:  Past Medical History:  Diagnosis Date  . Anemia   . BCE (basal cell epithelioma), leg, right   .  Cataract 2021  . Colon polyps   . Coronary artery disease 2008  . Coronary atherosclerosis   . Diabetes mellitus without complication ([place])    pre-diabetic  . DVT of lower extremity (deep venous thrombosis) [facility]) March 2012  . endometrioid endometrial   . GI bleed 2009  . GIST (gastrointestinal stromal tumor), malignant (HCC)    T2N0  . H pylori ulcer 2001  . Hypercholesteremia   . Hyperlipidemia   . Hypertension    per patient-on medication for preventative/has not diagnosed with HTN   . Insomnia   . Iron deficiency   . Joint pain   . Osteopenia   . Reflux   . Ulcer    epigastric     Past Gynecological History:  See HPI No LMP recorded. Patient is postmenopausal.  Family Hx:  Family History  Problem Relation Age of Onset  . Diabetes Mother   . Dementia Mother   . Hypertension Mother   . Heart attack Father   . Heart attack Brother   . Cancer  Brother        melanoma    Review of Systems:  Constitutional  Feels well,    ENT Normal appearing ears and nares bilaterally Skin/Breast  No rash, sores, jaundice, itching, dryness Cardiovascular  No chest pain, shortness of breath, or edema  Pulmonary  No cough or wheeze.  Gastro Intestinal  No nausea, vomitting, or diarrhoea. No bright red blood per rectum, no abdominal pain, change in bowel movement, or constipation.  Genito Urinary  No frequency, urgency, dysuria, + postmenopausal bleeding and pain with speculum exams Musculo Skeletal  No myalgia, arthralgia, joint swelling or pain  Neurologic  No weakness, numbness, change in gait,  Psychology  No depression, anxiety, insomnia.   Vitals:  Blood pressure (!) 145/51, pulse (!) 57, temperature 97.8 F (36.6 C), temperature source Tympanic, resp. rate 16, height _0  (1.651 m), weight 136 lb 12.8 oz (62.1 kg), SpO2 99 %.  Physical Exam: WD in NAD Neck  Supple NROM, without any enlargements.  Lymph Node Survey No cervical supraclavicular or inguinal adenopathy Cardiovascular  Well perfused peripheries Lungs  No increased WOB Skin  No rash/lesions/breakdown  Psychiatry  Alert and oriented to person, place, and time  Abdomen  Normoactive bowel sounds, abdomen soft, non-tender and nonobese without evidence of hernia.  Back No CVA tenderness Genito Urinary  Vulva/vagina: Normal external female genitalia.   No lesions. No discharge or bleeding.  Bladder/urethra:  No lesions or masses, well supported bladder  Vagina: The posterior left vaginal wall is replaced by friable plaque.  The vaginal apex is firm and rigid with infiltration into the paracolpos.  The exam findings are consistent with recurrent endometrial cancer in the vagina and perivaginal tissues.  Rectal  Good tone, no masses no cul de sac nodularity.  The tumor does not apparently breach the rectal mucosa on digital exam. Extremities  No bilateral  cyanosis, clubbing or edema.  Procedure Note:  Preop Dx: Vaginal mass, history of endometrial cancer Postop Dx: Same Procedure: Vaginal biopsy Surgeon: Dorann Ou, MD EBL: 5 cc Specimens: Vaginal wall Complications: None Procedure Details: The patient provided verbal consent and a verbal timeout was performed.  The speculum was inserted to the vagina and the friable plaque at the posterior vaginal wall was appreciated.  A Rainbow Tischler forcep was used to take a representative sample of this.  Small amount of bleeding was encountered which was made hemostatic with colpostat.  The patient  tolerated the procedure well.  Specimen was sent for histopathology.   60 minutes of total time was spent for this patient encounter, including preparation, face-to-face counseling with the patient and coordination of care, review of imaging (results and images), communication with the referring provider and documentation of the encounter.   Thereasa Solo, MD  05/13/2020, 10:26 AM

## 2020-05-13 NOTE — Patient Instructions (Signed)
Dr Denman George is concerned that there is a recurrence of your endometrial cancer growing in the vagina.  She will follow-up on the biopsy results. If these are non-diagnostic (meaning do not show obvious benign or cancerous cells) we will need to take you to the operating room to reattempt biopsies when you are more comfortable.   If this biopsy shows cancer, Dr Denman George will order scans to determine if the cancer is contained to the vaginal tissues or if it has spread further. Depending on the results of the scans, a decision would be made regarding whether radiation or chemotherapy is recommended.

## 2020-05-17 ENCOUNTER — Other Ambulatory Visit: Payer: Self-pay | Admitting: Gynecologic Oncology

## 2020-05-17 ENCOUNTER — Telehealth: Payer: Self-pay

## 2020-05-17 DIAGNOSIS — C541 Malignant neoplasm of endometrium: Secondary | ICD-10-CM

## 2020-05-17 NOTE — Progress Notes (Signed)
PET scan ordered to evaluate for signs of metastatic disease. Biopsy has returned as recurrent endometrial cancer.

## 2020-05-17 NOTE — Telephone Encounter (Signed)
Told Ms Riess that the biopsy came back showing recurrent endometrial cancer. Dr. Denman George would like to get a PET scan to determine if the cancer is contained to the vaginal tissues or if it has spread further. Will call her with the Pet Scan appointment. Ms Wildes verbalized understanding.

## 2020-05-17 NOTE — Telephone Encounter (Signed)
Gave [name] the appointment for Pet Scan on 05-31-20 at [facility] radiology at 12 pm She needs to arrive at 1130. She cannot eat or drink anything after 6 am 06-01-19.  Pt verbalized understanding.

## 2020-05-23 ENCOUNTER — Telehealth: Payer: Self-pay | Admitting: Oncology

## 2020-05-23 ENCOUNTER — Other Ambulatory Visit: Payer: Self-pay | Admitting: Oncology

## 2020-05-23 DIAGNOSIS — C541 Malignant neoplasm of endometrium: Secondary | ICD-10-CM

## 2020-05-23 NOTE — Progress Notes (Signed)
Gynecologic Oncology Multi-Disciplinary Disposition Conference Note  Date of the Conference: 05/23/2020  Patient Name: [name]  Referring Provider: Dr. Delora Fuel Primary GYN Oncologist: Dr. Denman George  Stage/Disposition:  Recurrent stage IA, grade 1 endometrial cancer. Disposition is for PET imaging followed by definitive salvage external beam radiation with consideration for brachytherapy.   This Multidisciplinary conference took place involving physicians from [place], [place], Radiation Oncology, Pathology, Radiology along with the Gynecologic Oncology Nurse Practitioner and RN.  Comprehensive assessment of the patient's malignancy, staging, need for surgery, chemotherapy, radiation therapy, and need for further testing were reviewed. Supportive measures, both inpatient and following discharge were also discussed. The recommended plan of care is documented. Greater than 35 minutes were spent correlating and coordinating this patient's care.

## 2020-05-23 NOTE — Telephone Encounter (Signed)
[place] and advised her of recommendations from the [place] this morning for PET scan followed by radiation therapy.  Notified her of appointment to see Dr. Sondra Come on 06/06/20 (1:30 nurse eval, 2:00 consult).  She verbalized understanding and agreement.  Gave her my contact number in case she has any questions.

## 2020-05-31 ENCOUNTER — Ambulatory Visit (HOSPITAL_COMMUNITY)
Admission: RE | Admit: 2020-05-31 | Discharge: 2020-05-31 | Disposition: A | Discharge status: Home / Self Care | Payer: PPO | Source: Ambulatory Visit | Attending: Gynecologic Oncology | Admitting: Gynecologic Oncology

## 2020-05-31 ENCOUNTER — Other Ambulatory Visit: Payer: Self-pay

## 2020-05-31 DIAGNOSIS — C541 Malignant neoplasm of endometrium: Secondary | ICD-10-CM | POA: Diagnosis not present

## 2020-05-31 DIAGNOSIS — I700 Atherosclerosis of aorta: Secondary | ICD-10-CM | POA: Diagnosis not present

## 2020-05-31 DIAGNOSIS — K7689 Other specified diseases of liver: Secondary | ICD-10-CM | POA: Diagnosis not present

## 2020-05-31 DIAGNOSIS — I2510 Atherosclerotic heart disease of native coronary artery without angina pectoris: Secondary | ICD-10-CM | POA: Insufficient documentation

## 2020-05-31 LAB — GLUCOSE, CAPILLARY: Glucose-Capillary: 159 mg/dL — ABNORMAL HIGH (ref 70–99)

## 2020-05-31 MED ORDER — FLUDEOXYGLUCOSE F - 18 (FDG) INJECTION
6.7600 | Freq: Once | INTRAVENOUS | Status: AC | PRN
  Administered 2020-05-31: 6.76 via INTRAVENOUS

## 2020-06-01 ENCOUNTER — Telehealth: Payer: Self-pay

## 2020-06-01 NOTE — Telephone Encounter (Signed)
Told Ms Tailor that the PET showed no gross evidence of spread of disease to the lymph nodes or other parts of the body. She is to proceed with the consult with Dr. Sondra Come as scheduled on 06-06-20 per Joylene John, NP. Pt verbalized understanding.

## 2020-06-02 ENCOUNTER — Telehealth: Payer: Self-pay

## 2020-06-02 NOTE — Telephone Encounter (Signed)
Patient's call returned as she had several questions about her consult on 06/06/20. Patient lives an hour away and was wanting to know when her daughter should come with her to her appointments. The daughter is a Education officer, museum and it is difficult for her to get away from work unless it is necessary. Patient given basic information about coming in for treatments and advised patient that no specific information about radiation treatments can be given at this time. Advised she needs to write down her questions and bring them when she sees Dr. Sondra Come and write down the answers if her daughter cannot come and cannot be on the phone with her. Patient advised that Dr. Sondra Come may or may not do a pelvic exam. She reports she has a few recently. Patient advised to call if she has further questions.

## 2020-06-02 NOTE — Progress Notes (Signed)
GYN Location of Tumor / Histology: Endometrial  [name] presented with symptoms of: Bleeding, pain/pressure in the pevlic area  Biopsies revealed: Recurrent Endometrial adenocarcinoma    Past/Anticipated interventions by Gyn/Onc surgery, if any: May 2018 robotic total hysterectomy with BSO and sentinel lymph node biopsy.  Past/Anticipated interventions by medical oncology, if any: None  Weight changes, if any: 40 lb weight loss r/t loss of husband/new diagnosis of DM, over past 5 years.  Bowel/Bladder complaints, if any: Yes.  , urinary incontinence/trouble emptying bladder,  Nausea/Vomiting, if any: nausea occasionally  Pain issues, if any: pressure in the pelvic area 1 out of 10  SAFETY ISSUES: Prior radiation? No Pacemaker/ICD? no Possible current pregnancy? no Is the patient on methotrexate? no  Current Complaints / other details:  Live by self in [place] (over 1 hour's drive to [place]).  Husband passed away 5 years ago.  Daughter lives 3 miles away from patient.  Vitals:   06/06/20 1327  BP: (!) 134/52  Pulse: (!) 46  Resp: 18  Temp: 97.8 F (36.6 C)  SpO2: 100%  Weight: 137 lb (62.1 kg)  Height: 5' 4.5" (1.638 m)

## 2020-06-06 ENCOUNTER — Other Ambulatory Visit: Payer: Self-pay | Admitting: Oncology

## 2020-06-06 ENCOUNTER — Encounter: Payer: Self-pay | Admitting: Oncology

## 2020-06-06 ENCOUNTER — Ambulatory Visit
Admission: RE | Admit: 2020-06-06 | Discharge: 2020-06-06 | Disposition: A | Discharge status: Home / Self Care | Payer: PPO | Source: Ambulatory Visit | Attending: Radiation Oncology | Admitting: Radiation Oncology

## 2020-06-06 ENCOUNTER — Other Ambulatory Visit: Payer: Self-pay

## 2020-06-06 VITALS — BP 134/52 | HR 46 | Temp 97.8°F | Resp 18 | Ht 64.5 in | Wt 137.0 lb

## 2020-06-06 DIAGNOSIS — Z7984 Long term (current) use of oral hypoglycemic drugs: Secondary | ICD-10-CM | POA: Insufficient documentation

## 2020-06-06 DIAGNOSIS — Z90722 Acquired absence of ovaries, bilateral: Secondary | ICD-10-CM | POA: Diagnosis not present

## 2020-06-06 DIAGNOSIS — E7800 Pure hypercholesterolemia, unspecified: Secondary | ICD-10-CM | POA: Insufficient documentation

## 2020-06-06 DIAGNOSIS — Z90710 Acquired absence of both cervix and uterus: Secondary | ICD-10-CM | POA: Diagnosis not present

## 2020-06-06 DIAGNOSIS — Z85828 Personal history of other malignant neoplasm of skin: Secondary | ICD-10-CM | POA: Insufficient documentation

## 2020-06-06 DIAGNOSIS — Z86718 Personal history of other venous thrombosis and embolism: Secondary | ICD-10-CM | POA: Insufficient documentation

## 2020-06-06 DIAGNOSIS — E119 Type 2 diabetes mellitus without complications: Secondary | ICD-10-CM | POA: Diagnosis not present

## 2020-06-06 DIAGNOSIS — C541 Malignant neoplasm of endometrium: Secondary | ICD-10-CM | POA: Diagnosis not present

## 2020-06-06 DIAGNOSIS — Z86010 Personal history of colonic polyps: Secondary | ICD-10-CM | POA: Insufficient documentation

## 2020-06-06 DIAGNOSIS — I2510 Atherosclerotic heart disease of native coronary artery without angina pectoris: Secondary | ICD-10-CM | POA: Insufficient documentation

## 2020-06-06 DIAGNOSIS — E785 Hyperlipidemia, unspecified: Secondary | ICD-10-CM | POA: Insufficient documentation

## 2020-06-06 DIAGNOSIS — I10 Essential (primary) hypertension: Secondary | ICD-10-CM | POA: Diagnosis not present

## 2020-06-06 DIAGNOSIS — M8580 Other specified disorders of bone density and structure, unspecified site: Secondary | ICD-10-CM | POA: Insufficient documentation

## 2020-06-06 DIAGNOSIS — Z7982 Long term (current) use of aspirin: Secondary | ICD-10-CM | POA: Insufficient documentation

## 2020-06-06 DIAGNOSIS — Z79899 Other long term (current) drug therapy: Secondary | ICD-10-CM | POA: Diagnosis not present

## 2020-06-06 NOTE — Progress Notes (Signed)
See doctor's visit for nurse encounter

## 2020-06-06 NOTE — Progress Notes (Signed)
[facility] with referral per Dr. Sondra Come.  They requested to have the referral and records faxed to [phone].  Fax sent successfully.

## 2020-06-06 NOTE — Progress Notes (Signed)
Gynecologic Oncology Multi-Disciplinary Disposition Conference Note  Date of the Conference: 06/06/2020  Patient Name: [name]  Referring Provider: Dr. Toribio Harbour Primary GYN Oncologist: Dr. Denman George  Stage/Disposition:  Stage IA, grade 1 recurrent endometrial cancer. Disposition is to external beam radiation and brachytherapy. MMR and ER/PR testing has been requested.   This Multidisciplinary conference took place involving physicians from [place], [place], Radiation Oncology, Pathology, Radiology along with the Gynecologic Oncology Nurse Practitioner and RN.  Comprehensive assessment of the patient's malignancy, staging, need for surgery, chemotherapy, radiation therapy, and need for further testing were reviewed. Supportive measures, both inpatient and following discharge were also discussed. The recommended plan of care is documented. Greater than 35 minutes were spent correlating and coordinating this patient's care.

## 2020-06-06 NOTE — Progress Notes (Signed)
Radiation Oncology         [phone] ________________________________  Initial Outpatient Consultation  Name: [name] MRN: [id number]  Date: 06/06/2020  DOB: 11/05/1940  [id number], Ronie Spies, MD  Everitt Amber, MD   REFERRING PHYSICIAN: Everitt Amber, MD  DIAGNOSIS: The encounter diagnosis was Endometrial cancer [facility]).  Recurrent stage IA, grade 1 endometrial cancer  HISTORY OF PRESENT ILLNESS::[name] is a 80 y.o. female who is seen as a courtesy of Dr. Denman George for an opinion concerning radiation therapy as part of management for her recurrent endometrial cancer at the vaginal cuff and posterior vaginal wall. . The patient was initially seen by Dr. Denman George in 2018 for evaluation of endometrial cancer. She underwent surgical staging in May of 2018, which included a robotic-assisted total hysterectomy with bilateral salpingo-oophorectomy and sentinel lymph node biopsy. Her surgery was complicated by extensive adhesions from her prior Whipple procedure. Final pathology revealed a grade 1 endometrioid endometrial adenocarcinoma that measured 4 cm. There was inner half myometrial invasion and negative sentinel lymph nodes with no lymphovascular space invasion. She was characterized to have low-risk endometrial cancer, therefore no adjuvant therapy was recommended.   Most recently, in December of 2021, she began to experience postmenopausal bleeding. She was seen by her PCP, who attempted a speculum exam but was unsuccessful due to discomfort. She was then seen by Dr. Delora Fuel, OB-GYN, who performed a limited speculum exam and recognized a vaginal mass.  Subsequently, the patient was referred back to Dr. Denman George and was seen on 05/13/2020. Exam findings were concerning for recurrent endometrial cancer. It was recommended that she undergo a biopsy, which revealed adenocarcinoma.  The patient's case was discussed at the Gynecologic Oncology Multi-Disciplinary Conference on  05/23/2020. It was recommended that she proceed with PET imaging followed by definitive salvage external beam radiation with consideration for brachytherapy.  PET scan on 05/31/2020 showed hypermetabolism noted in the vaginal cuff and upper 1/3 of the vagina, consistent with known neoplasm. There was no locoregional lymphadenopathy or distant metastatic disease.   PREVIOUS RADIATION THERAPY: No  PAST MEDICAL HISTORY:  Past Medical History:  Diagnosis Date  . Anemia   . BCE (basal cell epithelioma), leg, right   . Cataract 2021  . Colon polyps   . Coronary artery disease 2008  . Coronary atherosclerosis   . Diabetes mellitus without complication ([place])    pre-diabetic  . DVT of lower extremity (deep venous thrombosis) [facility]) March 2012  . endometrioid endometrial   . GI bleed 2009  . GIST (gastrointestinal stromal tumor), malignant ([facility])    T2N0  . H pylori ulcer 2001  . Hypercholesteremia   . Hyperlipidemia   . Hypertension    per patient-on medication for preventative/has not diagnosed with HTN   . Insomnia   . Iron deficiency   . Joint pain   . Osteopenia   . Reflux   . Ulcer    epigastric     PAST SURGICAL HISTORY: Past Surgical History:  Procedure Laterality Date  . ABDOMINAL HYSTERECTOMY    . AUGMENTATION MAMMAPLASTY    . BREAST ENHANCEMENT SURGERY     since removed in 2016  . BREAST SURGERY    . CARDIAC CATHETERIZATION    . CATARACT EXTRACTION Bilateral 2021  . CHOLECYSTECTOMY    . PANCREATICODUODENECTOMY  06/27/2010   whipple   . ROBOTIC ASSISTED TOTAL HYSTERECTOMY WITH BILATERAL SALPINGO OOPHERECTOMY Bilateral 10/02/2016   Procedure: XI ROBOTIC ASSISTED TOTAL HYSTERECTOMY WITH BILATERAL SALPINGO OOPHORECTOMY WITH SENTINAL  LYMPH NODE BIOPSY LYSIS OF ADHESIONS, EXPLORATORY LAPAROTOMY;  Surgeon: Everitt Amber, MD;  Location: [facility];  Service: Gynecology;  Laterality: Bilateral;  . TUBAL LIGATION    . WHIPPLE PROCEDURE  2012    FAMILY HISTORY:  Family History   Problem Relation Age of Onset  . Diabetes Mother   . Dementia Mother   . Hypertension Mother   . Heart attack Father   . Heart attack Brother   . Cancer Brother        melanoma    SOCIAL HISTORY:  Social History   Tobacco Use  . Smoking status: Never Smoker  . Smokeless tobacco: Never Used  Substance Use Topics  . Alcohol use: No  . Drug use: No    ALLERGIES:  Allergies  Allergen Reactions  . Ibuprofen Hives    MEDICATIONS:  Current Outpatient Medications  Medication Sig Dispense Refill  . Apoaequorin (PREVAGEN PO) Take 1 tablet by mouth daily.    Marland Kitchen aspirin 81 MG chewable tablet Chew 1 tablet by mouth daily.    . cholecalciferol (VITAMIN D) 1000 UNITS tablet Take 1,000 Units by mouth daily.     . Coenzyme Q10 (COQ-10) 100 MG CAPS Take 100 mg by mouth daily.    . empagliflozin (JARDIANCE) 10 MG TABS tablet Take 1 tablet by mouth daily.    . lipase/protease/amylase 24000-76000 units CPEP Take 1 capsule (24,000 Units total) by mouth 3 (three) times daily before meals. (Patient taking differently: Take 24,000 Units by mouth 2 (two) times daily with a meal.) 270 capsule 1  . Melatonin 10 MG TABS Take 10 mg by mouth at bedtime as needed. (Patient not taking: Reported on 06/06/2020)    . metFORMIN (GLUCOPHAGE-XR) 500 MG 24 hr tablet Take 2 tablets by mouth at bedtime.    . metoprolol succinate (TOPROL-XL) 25 MG 24 hr tablet Take 1 tablet by mouth 2 (two) times daily.    . simvastatin (ZOCOR) 20 MG tablet Take 20 mg by mouth every evening.    . sucralfate (CARAFATE) 1 g tablet Take 1 g by mouth 2 (two) times daily.    . vitamin E 180 MG (400 UNITS) capsule Take 400 Units by mouth daily.     No current facility-administered medications for this encounter.    REVIEW OF SYSTEMS:  A 10+ POINT REVIEW OF SYSTEMS WAS OBTAINED including neurology, dermatology, psychiatry, cardiac, respiratory, lymph, extremities, GI, GU, musculoskeletal, constitutional, reproductive, HEENT.  The  patient reports minimal vaginal bleeding at this time. She does wear a pad in light of her urinary incontinence which has been a chronic problem for her.  She denies any pain within the pelvis region or low back pain.  She denies any abdominal bloating.   PHYSICAL EXAM:  height is 5' 4.5" (1.638 m) and weight is 137 lb (62.1 kg). Her temperature is 97.8 F (36.6 C). Her blood pressure is 134/52 (abnormal) and her pulse is 46 (abnormal). Her respiration is 18 and oxygen saturation is 100%.   General: Alert and oriented, in no acute distress HEENT: Head is normocephalic. Extraocular movements are intact.  Neck: Neck is supple, no palpable cervical or supraclavicular lymphadenopathy. Heart: Regular in rate and rhythm with no murmurs, rubs, or gallops. Chest: Clear to auscultation bilaterally, with no rhonchi, wheezes, or rales. Abdomen: Soft, nontender, nondistended, with no rigidity or guarding. Extremities: No cyanosis or edema. Lymphatics: see Neck Exam Skin: No concerning lesions. Musculoskeletal: symmetric strength and muscle tone throughout. Neurologic: Cranial nerves II through  XII are grossly intact. No obvious focalities. Speech is fluent. Coordination is intact. Psychiatric: Judgment and insight are intact. Affect is appropriate. Pelvic exam deferred in light of patient's request as as she is requesting a treatment facility closer to her home in [place]  ECOG = 1  0 - Asymptomatic (Fully active, able to carry on all predisease activities without restriction)  1 - Symptomatic but completely ambulatory (Restricted in physically strenuous activity but ambulatory and able to carry out work of a light or sedentary nature. For example, light housework, office work)  2 - Symptomatic, <50% in bed during the day (Ambulatory and capable of all self care but unable to carry out any work activities. Up and about more than 50% of waking hours)  3 - Symptomatic, >50% in bed, but not  bedbound (Capable of only limited self-care, confined to bed or chair 50% or more of waking hours)  4 - Bedbound (Completely disabled. Cannot carry on any self-care. Totally confined to bed or chair)  5 - Death   Eustace Pen MM, Creech RH, Tormey DC, et al. [phone]). "Toxicity and response criteria of the [facility]". [place] Oncol. 5 (6): 649-55  LABORATORY DATA:  Lab Results  Component Value Date   WBC 5.4 10/24/2016   HGB 11.5 (L) 10/24/2016   HCT 36.1 10/24/2016   MCV 94.0 10/24/2016   PLT 240 10/24/2016   NEUTROABS 3.3 10/24/2016   Lab Results  Component Value Date   NA 137 10/03/2016   K 4.5 10/03/2016   CL 105 10/03/2016   CO2 26 10/03/2016   GLUCOSE 210 (H) 10/03/2016   CREATININE 0.70 10/03/2016   CALCIUM 8.3 (L) 10/03/2016      RADIOGRAPHY: NM PET Image Initial (PI) Skull Base To Thigh  Result Date: 05/31/2020 CLINICAL DATA:  Initial treatment strategy for endometrial carcinoma at the vaginal cuff. Remote history of hysterectomy in 2018. EXAM: NUCLEAR MEDICINE PET SKULL BASE TO THIGH TECHNIQUE: 6.76 mCi F-18 FDG was injected intravenously. Full-ring PET imaging was performed from the skull base to thigh after the radiotracer. CT data was obtained and used for attenuation correction and anatomic localization. Fasting blood glucose: 157 mg/dl COMPARISON:  CT scan from 09/16/2015 FINDINGS: Mediastinal blood pool activity: SUV max 2.71 Liver activity: SUV max NA NECK: No hypermetabolic lymph nodes in the neck. Incidental CT findings: none CHEST: No hypermetabolic mediastinal or hilar nodes. No suspicious pulmonary nodules on the CT scan. No breast masses, supraclavicular or axillary adenopathy. Incidental CT findings: Aortic and three-vessel coronary artery calcifications are noted. ABDOMEN/PELVIS: Moderate diffuse FDG uptake is noted in the vaginal cuff and upper third of the vagina consistent with known neoplasm. SUV max is 11.28. No adjacent  lymphadenopathy. No enlarged or hypermetabolic lymph nodes are demonstrated in the mesorectum, sigmoid mesocolon or pelvic sidewall. No retroperitoneal or mesenteric adenopathy. No omental or peritoneal surface disease identified. Incidental CT findings: Moderate vascular calcifications no aneurysm. Stable left hepatic lobe cyst. Status post hysterectomy and bilateral oophorectomy. SKELETON: No focal hypermetabolic activity to suggest skeletal metastasis. Incidental CT findings: none IMPRESSION: 1. Hypermetabolism noted in the vaginal cuff and upper 1/3 of the vagina consistent with known neoplasm. No locoregional lymphadenopathy or distant metastatic disease. 2. Atherosclerotic calcifications involving the thoracic and abdominal aorta and branch vessels including three-vessel coronary artery calcifications. Electronically Signed   By: Marijo Sanes M.D.   On: 05/31/2020 14:01      IMPRESSION: Recurrent stage IA, grade 1 endometrial cancer  The patient would be a good candidate for salvage radiation therapy with pelvic radiation therapy followed by intracavitary brachytherapy treatments (pending detailed pelvic exam). I discussed the general course of treatment side effects and potential toxicities of radiation therapy in this situation with the patient.  She may potentially benefit from a pelvic MRI to help in further evaluation of her recurrence but will leave this up to her radiation oncologist at [facility].  The patient is requesting treatment facility closer to her home which is at least an hour from [place].  We have checked with her insurance and she would be In- network at [facility] and we have made referral to this facility for consideration for radiation therapy.  PLAN: As needed follow-up in radiation oncology in [place].  After she completes her salvage radiation therapy she will continue close follow-up with Dr. Everitt Amber.  Total time spent in this  encounter was 45 minutes which included reviewing the patient's most recent follow-ups, biopsy, pathology report, PET scan, physical examination, and documentation.  ------------------------------------------------  Blair Promise, PhD, MD  This document serves as a record of services personally performed by Gery Pray, MD. It was created on his behalf by Clerance Lav, a trained medical scribe. The creation of this record is based on the scribe's personal observations and the provider's statements to them. This document has been checked and approved by the attending provider.

## 2020-06-07 ENCOUNTER — Telehealth: Payer: Self-pay | Admitting: *Deleted

## 2020-06-07 ENCOUNTER — Encounter: Payer: Self-pay | Admitting: Oncology

## 2020-06-07 LAB — SURGICAL PATHOLOGY

## 2020-06-07 NOTE — Progress Notes (Signed)
Requested [facility] Hypermethylation testing on accession [id number] with [facility] via email.

## 2020-06-07 NOTE — Telephone Encounter (Signed)
Patient called to report that she received a referral from Dr Sondra Come to receive treatment local to her home in [place].  That office isn't able to see her for consultation until  January 28th.  Patient wants to know if waiting that long is ok for her current diagnosis and if not can treatment begin here and then transfer.  Routed to Dr Sondra Come.

## 2020-06-08 ENCOUNTER — Telehealth: Payer: Self-pay | Admitting: *Deleted

## 2020-06-08 ENCOUNTER — Telehealth: Payer: Self-pay | Admitting: Oncology

## 2020-06-08 ENCOUNTER — Telehealth: Payer: Self-pay | Admitting: Emergency Medicine

## 2020-06-08 ENCOUNTER — Institutional Professional Consult (permissible substitution): Payer: PPO | Admitting: Radiation Oncology

## 2020-06-08 NOTE — Telephone Encounter (Signed)
[place] regarding the referral to [facility].  She said she could only get an appointment on 1/28 because the doctor was out.  She has decided to have her treatments in [place] because she feels more comfortable here.  Offered to call [facility] to see if her appointment can be moved up and she said she would just like to have her treatments in [place].  Advised her that Dr. Sondra Come will be notified and we will be calling her for an appointment.

## 2020-06-08 NOTE — Telephone Encounter (Signed)
Patient called and stated "you all were going to set up for me to have radiation treatments in [place], but they can't see me until 1/28. I called yesterday to see if that would be ok. But after thinking about it, I want to come there for the treatments. I have for confidence in you all." Explained that I will forwarded the message to Dr Sondra Come and his nurse. Patient also transferred to Dr Sondra Come nurse

## 2020-06-13 ENCOUNTER — Ambulatory Visit: Payer: PPO | Admitting: Radiation Oncology

## 2020-06-15 ENCOUNTER — Other Ambulatory Visit: Payer: Self-pay

## 2020-06-15 ENCOUNTER — Ambulatory Visit
Admission: RE | Admit: 2020-06-15 | Discharge: 2020-06-15 | Disposition: A | Discharge status: Home / Self Care | Payer: PPO | Source: Ambulatory Visit | Attending: Radiation Oncology | Admitting: Radiation Oncology

## 2020-06-15 DIAGNOSIS — C541 Malignant neoplasm of endometrium: Secondary | ICD-10-CM | POA: Insufficient documentation

## 2020-06-15 DIAGNOSIS — Z510 Encounter for antineoplastic radiation therapy: Secondary | ICD-10-CM | POA: Insufficient documentation

## 2020-06-20 DIAGNOSIS — Z510 Encounter for antineoplastic radiation therapy: Secondary | ICD-10-CM | POA: Diagnosis not present

## 2020-06-20 DIAGNOSIS — C541 Malignant neoplasm of endometrium: Secondary | ICD-10-CM | POA: Diagnosis not present

## 2020-06-21 ENCOUNTER — Other Ambulatory Visit: Payer: Self-pay

## 2020-06-21 ENCOUNTER — Ambulatory Visit
Admission: RE | Admit: 2020-06-21 | Discharge: 2020-06-21 | Disposition: A | Discharge status: Home / Self Care | Payer: PPO | Source: Ambulatory Visit | Attending: Radiation Oncology | Admitting: Radiation Oncology

## 2020-06-21 DIAGNOSIS — C541 Malignant neoplasm of endometrium: Secondary | ICD-10-CM

## 2020-06-21 DIAGNOSIS — Z510 Encounter for antineoplastic radiation therapy: Secondary | ICD-10-CM | POA: Diagnosis not present

## 2020-06-22 ENCOUNTER — Other Ambulatory Visit: Payer: Self-pay

## 2020-06-22 ENCOUNTER — Ambulatory Visit
Admission: RE | Admit: 2020-06-22 | Discharge: 2020-06-22 | Disposition: A | Discharge status: Home / Self Care | Payer: PPO | Source: Ambulatory Visit | Attending: Radiation Oncology | Admitting: Radiation Oncology

## 2020-06-22 DIAGNOSIS — C541 Malignant neoplasm of endometrium: Secondary | ICD-10-CM | POA: Diagnosis not present

## 2020-06-22 DIAGNOSIS — Z510 Encounter for antineoplastic radiation therapy: Secondary | ICD-10-CM | POA: Diagnosis not present

## 2020-06-23 ENCOUNTER — Ambulatory Visit
Admission: RE | Admit: 2020-06-23 | Discharge: 2020-06-23 | Disposition: A | Discharge status: Home / Self Care | Payer: PPO | Source: Ambulatory Visit | Attending: Radiation Oncology | Admitting: Radiation Oncology

## 2020-06-23 DIAGNOSIS — Z510 Encounter for antineoplastic radiation therapy: Secondary | ICD-10-CM | POA: Diagnosis not present

## 2020-06-23 DIAGNOSIS — C541 Malignant neoplasm of endometrium: Secondary | ICD-10-CM | POA: Diagnosis not present

## 2020-06-24 ENCOUNTER — Ambulatory Visit
Admission: RE | Admit: 2020-06-24 | Discharge: 2020-06-24 | Disposition: A | Discharge status: Home / Self Care | Payer: PPO | Source: Ambulatory Visit | Attending: Radiation Oncology | Admitting: Radiation Oncology

## 2020-06-24 DIAGNOSIS — E1169 Type 2 diabetes mellitus with other specified complication: Secondary | ICD-10-CM | POA: Diagnosis not present

## 2020-06-24 DIAGNOSIS — E119 Type 2 diabetes mellitus without complications: Secondary | ICD-10-CM | POA: Diagnosis not present

## 2020-06-24 DIAGNOSIS — E1165 Type 2 diabetes mellitus with hyperglycemia: Secondary | ICD-10-CM | POA: Diagnosis not present

## 2020-06-24 DIAGNOSIS — Z510 Encounter for antineoplastic radiation therapy: Secondary | ICD-10-CM | POA: Diagnosis not present

## 2020-06-24 DIAGNOSIS — M810 Age-related osteoporosis without current pathological fracture: Secondary | ICD-10-CM | POA: Diagnosis not present

## 2020-06-24 DIAGNOSIS — E785 Hyperlipidemia, unspecified: Secondary | ICD-10-CM | POA: Diagnosis not present

## 2020-06-24 DIAGNOSIS — Z8542 Personal history of malignant neoplasm of other parts of uterus: Secondary | ICD-10-CM | POA: Diagnosis not present

## 2020-06-24 DIAGNOSIS — D509 Iron deficiency anemia, unspecified: Secondary | ICD-10-CM | POA: Diagnosis not present

## 2020-06-24 DIAGNOSIS — G4700 Insomnia, unspecified: Secondary | ICD-10-CM | POA: Diagnosis not present

## 2020-06-24 DIAGNOSIS — C541 Malignant neoplasm of endometrium: Secondary | ICD-10-CM | POA: Diagnosis not present

## 2020-06-27 ENCOUNTER — Ambulatory Visit
Admission: RE | Admit: 2020-06-27 | Discharge: 2020-06-27 | Disposition: A | Discharge status: Home / Self Care | Payer: PPO | Source: Ambulatory Visit | Attending: Radiation Oncology | Admitting: Radiation Oncology

## 2020-06-27 DIAGNOSIS — Z510 Encounter for antineoplastic radiation therapy: Secondary | ICD-10-CM | POA: Diagnosis not present

## 2020-06-27 DIAGNOSIS — C541 Malignant neoplasm of endometrium: Secondary | ICD-10-CM | POA: Diagnosis not present

## 2020-06-28 ENCOUNTER — Ambulatory Visit
Admission: RE | Admit: 2020-06-28 | Discharge: 2020-06-28 | Disposition: A | Discharge status: Home / Self Care | Payer: PPO | Source: Ambulatory Visit | Attending: Radiation Oncology | Admitting: Radiation Oncology

## 2020-06-28 DIAGNOSIS — C801 Malignant (primary) neoplasm, unspecified: Secondary | ICD-10-CM | POA: Diagnosis not present

## 2020-06-28 DIAGNOSIS — R300 Dysuria: Secondary | ICD-10-CM | POA: Diagnosis not present

## 2020-06-28 DIAGNOSIS — C541 Malignant neoplasm of endometrium: Secondary | ICD-10-CM | POA: Diagnosis not present

## 2020-06-29 ENCOUNTER — Other Ambulatory Visit: Payer: Self-pay

## 2020-06-29 ENCOUNTER — Ambulatory Visit
Admission: RE | Admit: 2020-06-29 | Discharge: 2020-06-29 | Disposition: A | Discharge status: Home / Self Care | Payer: PPO | Source: Ambulatory Visit | Attending: Radiation Oncology | Admitting: Radiation Oncology

## 2020-06-29 DIAGNOSIS — C541 Malignant neoplasm of endometrium: Secondary | ICD-10-CM | POA: Diagnosis not present

## 2020-06-30 ENCOUNTER — Ambulatory Visit
Admission: RE | Admit: 2020-06-30 | Discharge: 2020-06-30 | Disposition: A | Discharge status: Home / Self Care | Payer: PPO | Source: Ambulatory Visit | Attending: Radiation Oncology | Admitting: Radiation Oncology

## 2020-06-30 ENCOUNTER — Other Ambulatory Visit: Payer: Self-pay

## 2020-06-30 DIAGNOSIS — C541 Malignant neoplasm of endometrium: Secondary | ICD-10-CM | POA: Diagnosis not present

## 2020-07-01 ENCOUNTER — Other Ambulatory Visit: Payer: Self-pay

## 2020-07-01 ENCOUNTER — Ambulatory Visit
Admission: RE | Admit: 2020-07-01 | Discharge: 2020-07-01 | Disposition: A | Discharge status: Home / Self Care | Payer: PPO | Source: Ambulatory Visit | Attending: Radiation Oncology | Admitting: Radiation Oncology

## 2020-07-01 DIAGNOSIS — C541 Malignant neoplasm of endometrium: Secondary | ICD-10-CM | POA: Diagnosis not present

## 2020-07-04 ENCOUNTER — Ambulatory Visit
Admission: RE | Admit: 2020-07-04 | Discharge: 2020-07-04 | Disposition: A | Discharge status: Home / Self Care | Payer: PPO | Source: Ambulatory Visit | Attending: Radiation Oncology | Admitting: Radiation Oncology

## 2020-07-04 DIAGNOSIS — C541 Malignant neoplasm of endometrium: Secondary | ICD-10-CM | POA: Diagnosis not present

## 2020-07-05 ENCOUNTER — Ambulatory Visit
Admission: RE | Admit: 2020-07-05 | Discharge: 2020-07-05 | Disposition: A | Discharge status: Home / Self Care | Payer: PPO | Source: Ambulatory Visit | Attending: Radiation Oncology | Admitting: Radiation Oncology

## 2020-07-05 DIAGNOSIS — C541 Malignant neoplasm of endometrium: Secondary | ICD-10-CM | POA: Diagnosis not present

## 2020-07-06 ENCOUNTER — Other Ambulatory Visit: Payer: Self-pay

## 2020-07-06 ENCOUNTER — Ambulatory Visit
Admission: RE | Admit: 2020-07-06 | Discharge: 2020-07-06 | Disposition: A | Discharge status: Home / Self Care | Payer: PPO | Source: Ambulatory Visit | Attending: Radiation Oncology | Admitting: Radiation Oncology

## 2020-07-06 DIAGNOSIS — C541 Malignant neoplasm of endometrium: Secondary | ICD-10-CM | POA: Diagnosis not present

## 2020-07-07 ENCOUNTER — Other Ambulatory Visit: Payer: Self-pay

## 2020-07-07 ENCOUNTER — Ambulatory Visit
Admission: RE | Admit: 2020-07-07 | Discharge: 2020-07-07 | Disposition: A | Discharge status: Home / Self Care | Payer: PPO | Source: Ambulatory Visit | Attending: Radiation Oncology | Admitting: Radiation Oncology

## 2020-07-07 DIAGNOSIS — C541 Malignant neoplasm of endometrium: Secondary | ICD-10-CM | POA: Diagnosis not present

## 2020-07-08 ENCOUNTER — Ambulatory Visit
Admission: RE | Admit: 2020-07-08 | Discharge: 2020-07-08 | Disposition: A | Discharge status: Home / Self Care | Payer: PPO | Source: Ambulatory Visit | Attending: Radiation Oncology | Admitting: Radiation Oncology

## 2020-07-08 DIAGNOSIS — C541 Malignant neoplasm of endometrium: Secondary | ICD-10-CM | POA: Diagnosis not present

## 2020-07-11 ENCOUNTER — Other Ambulatory Visit: Payer: Self-pay

## 2020-07-11 ENCOUNTER — Ambulatory Visit
Admission: RE | Admit: 2020-07-11 | Discharge: 2020-07-11 | Disposition: A | Discharge status: Home / Self Care | Payer: PPO | Source: Ambulatory Visit | Attending: Radiation Oncology | Admitting: Radiation Oncology

## 2020-07-11 DIAGNOSIS — C541 Malignant neoplasm of endometrium: Secondary | ICD-10-CM | POA: Diagnosis not present

## 2020-07-12 ENCOUNTER — Other Ambulatory Visit: Payer: Self-pay

## 2020-07-12 ENCOUNTER — Ambulatory Visit
Admission: RE | Admit: 2020-07-12 | Discharge: 2020-07-12 | Disposition: A | Discharge status: Home / Self Care | Payer: PPO | Source: Ambulatory Visit | Attending: Radiation Oncology | Admitting: Radiation Oncology

## 2020-07-12 DIAGNOSIS — D509 Iron deficiency anemia, unspecified: Secondary | ICD-10-CM | POA: Diagnosis not present

## 2020-07-12 DIAGNOSIS — E1165 Type 2 diabetes mellitus with hyperglycemia: Secondary | ICD-10-CM | POA: Diagnosis not present

## 2020-07-12 DIAGNOSIS — E119 Type 2 diabetes mellitus without complications: Secondary | ICD-10-CM | POA: Diagnosis not present

## 2020-07-12 DIAGNOSIS — E785 Hyperlipidemia, unspecified: Secondary | ICD-10-CM | POA: Diagnosis not present

## 2020-07-12 DIAGNOSIS — M810 Age-related osteoporosis without current pathological fracture: Secondary | ICD-10-CM | POA: Diagnosis not present

## 2020-07-12 DIAGNOSIS — E1169 Type 2 diabetes mellitus with other specified complication: Secondary | ICD-10-CM | POA: Diagnosis not present

## 2020-07-12 DIAGNOSIS — Z8542 Personal history of malignant neoplasm of other parts of uterus: Secondary | ICD-10-CM | POA: Diagnosis not present

## 2020-07-12 DIAGNOSIS — G4700 Insomnia, unspecified: Secondary | ICD-10-CM | POA: Diagnosis not present

## 2020-07-12 DIAGNOSIS — I2510 Atherosclerotic heart disease of native coronary artery without angina pectoris: Secondary | ICD-10-CM | POA: Diagnosis not present

## 2020-07-12 DIAGNOSIS — C541 Malignant neoplasm of endometrium: Secondary | ICD-10-CM | POA: Diagnosis not present

## 2020-07-13 ENCOUNTER — Ambulatory Visit
Admission: RE | Admit: 2020-07-13 | Discharge: 2020-07-13 | Disposition: A | Discharge status: Home / Self Care | Payer: PPO | Source: Ambulatory Visit | Attending: Radiation Oncology | Admitting: Radiation Oncology

## 2020-07-13 ENCOUNTER — Ambulatory Visit: Payer: PPO | Admitting: Dermatology

## 2020-07-13 ENCOUNTER — Encounter: Payer: Self-pay | Admitting: Dermatology

## 2020-07-13 DIAGNOSIS — D0461 Carcinoma in situ of skin of right upper limb, including shoulder: Secondary | ICD-10-CM

## 2020-07-13 DIAGNOSIS — Z85828 Personal history of other malignant neoplasm of skin: Secondary | ICD-10-CM | POA: Diagnosis not present

## 2020-07-13 DIAGNOSIS — L570 Actinic keratosis: Secondary | ICD-10-CM | POA: Diagnosis not present

## 2020-07-13 DIAGNOSIS — D485 Neoplasm of uncertain behavior of skin: Secondary | ICD-10-CM

## 2020-07-13 DIAGNOSIS — D045 Carcinoma in situ of skin of trunk: Secondary | ICD-10-CM

## 2020-07-13 DIAGNOSIS — Z1283 Encounter for screening for malignant neoplasm of skin: Secondary | ICD-10-CM | POA: Diagnosis not present

## 2020-07-13 DIAGNOSIS — C541 Malignant neoplasm of endometrium: Secondary | ICD-10-CM | POA: Diagnosis not present

## 2020-07-13 NOTE — Patient Instructions (Signed)

## 2020-07-14 ENCOUNTER — Ambulatory Visit
Admission: RE | Admit: 2020-07-14 | Discharge: 2020-07-14 | Disposition: A | Discharge status: Home / Self Care | Payer: PPO | Source: Ambulatory Visit | Attending: Radiation Oncology | Admitting: Radiation Oncology

## 2020-07-14 ENCOUNTER — Other Ambulatory Visit: Payer: Self-pay

## 2020-07-14 DIAGNOSIS — C541 Malignant neoplasm of endometrium: Secondary | ICD-10-CM | POA: Diagnosis not present

## 2020-07-15 ENCOUNTER — Ambulatory Visit
Admission: RE | Admit: 2020-07-15 | Discharge: 2020-07-15 | Disposition: A | Discharge status: Home / Self Care | Payer: PPO | Source: Ambulatory Visit | Attending: Radiation Oncology | Admitting: Radiation Oncology

## 2020-07-15 DIAGNOSIS — C541 Malignant neoplasm of endometrium: Secondary | ICD-10-CM | POA: Diagnosis not present

## 2020-07-17 ENCOUNTER — Encounter: Payer: Self-pay | Admitting: Dermatology

## 2020-07-17 NOTE — Progress Notes (Signed)
Follow-Up Visit   Subjective  [name] is a 80 y.o. female who presents for the following: Annual Exam (Spine lesion she has scraped the top off itching also itchy spot on right forearm).  Annual skin examination, several new and changing lesions. Location:  Duration:  Quality:  Associated Signs/Symptoms: Modifying Factors:  Severity:  Timing: Context: History of multiple skin cancers  Objective  Well appearing patient in no apparent distress; mood and affect are within normal limits. Objective  Right Buccal Cheek, Right Forehead, Right Lower Leg - Anterior: Right cheek scar is near but not contiguous with new crust.  Others show no sign of residual.  Images      Objective  Head to Waist: No atypical moles or melanoma  Objective  Right Malar Cheek: 1.8 cm eroded ill-defined flat pink papule     Objective  Mid Upper Back: Hemorrhagic 35mm crust       Objective  Right Forearm - Posterior: 1.4 cm eroded pink crust       Objective  Right Elbow - Posterior: Check 32mm pink crust        All skin waist up examined.   Assessment & Plan    History of basal cell carcinoma (BCC) (3) Right Lower Leg - Anterior; Right Buccal Cheek; Right Forehead  Check as needed change  Skin exam for malignant neoplasm Head to Waist  Annual skin check  Neoplasm of uncertain behavior of skin (4) Right Malar Cheek  Skin / nail biopsy Type of biopsy: tangential   Informed consent: discussed and consent obtained   Timeout: patient name, date of birth, surgical site, and procedure verified   Procedure prep:  Patient was prepped and draped in usual sterile fashion (Non sterile) Prep type:  Chlorhexidine Anesthesia: the lesion was anesthetized in a standard fashion   Anesthetic:  1% lidocaine w/ epinephrine 1-100,000 local infiltration Instrument used: flexible razor blade   Hemostasis achieved with: ferric subsulfate   Outcome: patient  tolerated procedure well   Post-procedure details: sterile dressing applied and wound care instructions given   Dressing type: bandage and petrolatum    Specimen 1 - Surgical pathology Differential Diagnosis: R/O BCC vs SCC  Check Margins: No  Mid Upper Back  Skin / nail biopsy Type of biopsy: tangential   Informed consent: discussed and consent obtained   Timeout: patient name, date of birth, surgical site, and procedure verified   Procedure prep:  Patient was prepped and draped in usual sterile fashion (Non sterile) Prep type:  Isopropyl alcohol Anesthesia: the lesion was anesthetized in a standard fashion   Anesthetic:  1% lidocaine w/ epinephrine 1-100,000 local infiltration Instrument used: flexible razor blade   Hemostasis achieved with: ferric subsulfate and electrodesiccation   Outcome: patient tolerated procedure well   Post-procedure details: sterile dressing applied and wound care instructions given   Dressing type: bandage and petrolatum    Specimen 2 - Surgical pathology Differential Diagnosis: R/O BCC vs SCC  Check Margins: No  Cautery after biopsy  Right Forearm - Posterior  Skin / nail biopsy Type of biopsy: tangential   Informed consent: discussed and consent obtained   Timeout: patient name, date of birth, surgical site, and procedure verified   Procedure prep:  Patient was prepped and draped in usual sterile fashion (Non sterile) Prep type:  Chlorhexidine Anesthesia: the lesion was anesthetized in a standard fashion   Anesthetic:  1% lidocaine w/ epinephrine 1-100,000 local infiltration Instrument used: flexible razor blade  Hemostasis achieved with: ferric subsulfate   Outcome: patient tolerated procedure well   Post-procedure details: sterile dressing applied and wound care instructions given   Dressing type: bandage and petrolatum    Specimen 3 - Surgical pathology Differential Diagnosis: R/O BCC vs SCC  Check Margins: No  Right Elbow -  Posterior  Skin / nail biopsy Type of biopsy: tangential   Informed consent: discussed and consent obtained   Timeout: patient name, date of birth, surgical site, and procedure verified   Procedure prep:  Patient was prepped and draped in usual sterile fashion (Non sterile) Prep type:  Chlorhexidine Anesthesia: the lesion was anesthetized in a standard fashion   Anesthetic:  1% lidocaine w/ epinephrine 1-100,000 local infiltration Instrument used: flexible razor blade   Hemostasis achieved with: ferric subsulfate   Outcome: patient tolerated procedure well   Post-procedure details: sterile dressing applied and wound care instructions given   Dressing type: bandage and petrolatum    Specimen 4 - Surgical pathology Differential Diagnosis: R/O BCC vs SCC  Check Margins: No     I, Lavonna Monarch, MD, have reviewed all documentation for this visit.  The documentation on 07/17/20 for the exam, diagnosis, procedures, and orders are all accurate and complete.

## 2020-07-18 ENCOUNTER — Ambulatory Visit
Admission: RE | Admit: 2020-07-18 | Discharge: 2020-07-18 | Disposition: A | Discharge status: Home / Self Care | Payer: PPO | Source: Ambulatory Visit | Attending: Radiation Oncology | Admitting: Radiation Oncology

## 2020-07-18 ENCOUNTER — Other Ambulatory Visit: Payer: Self-pay

## 2020-07-18 DIAGNOSIS — C541 Malignant neoplasm of endometrium: Secondary | ICD-10-CM

## 2020-07-18 DIAGNOSIS — C801 Malignant (primary) neoplasm, unspecified: Secondary | ICD-10-CM

## 2020-07-18 MED ORDER — SONAFINE EX EMUL
1.0000 "application " | Freq: Two times a day (BID) | CUTANEOUS | Status: DC
  Administered 2020-07-18: 1 via TOPICAL

## 2020-07-19 ENCOUNTER — Ambulatory Visit
Admission: RE | Admit: 2020-07-19 | Discharge: 2020-07-19 | Disposition: A | Discharge status: Home / Self Care | Payer: PPO | Source: Ambulatory Visit | Attending: Radiation Oncology | Admitting: Radiation Oncology

## 2020-07-19 DIAGNOSIS — C541 Malignant neoplasm of endometrium: Secondary | ICD-10-CM | POA: Diagnosis not present

## 2020-07-20 ENCOUNTER — Ambulatory Visit
Admission: RE | Admit: 2020-07-20 | Discharge: 2020-07-20 | Disposition: A | Discharge status: Home / Self Care | Payer: PPO | Source: Ambulatory Visit | Attending: Radiation Oncology | Admitting: Radiation Oncology

## 2020-07-20 DIAGNOSIS — C541 Malignant neoplasm of endometrium: Secondary | ICD-10-CM | POA: Diagnosis not present

## 2020-07-21 ENCOUNTER — Other Ambulatory Visit: Payer: Self-pay

## 2020-07-21 ENCOUNTER — Ambulatory Visit
Admission: RE | Admit: 2020-07-21 | Discharge: 2020-07-21 | Disposition: A | Discharge status: Home / Self Care | Payer: PPO | Source: Ambulatory Visit | Attending: Radiation Oncology | Admitting: Radiation Oncology

## 2020-07-21 ENCOUNTER — Telehealth: Payer: Self-pay | Admitting: Dermatology

## 2020-07-21 DIAGNOSIS — C541 Malignant neoplasm of endometrium: Secondary | ICD-10-CM | POA: Diagnosis not present

## 2020-07-21 NOTE — Telephone Encounter (Signed)
Phone call to patient to inform her that he pathology results aren't back yet and that once we receive them and Dr. Denna Haggard reviews them then we will give her a call.  Patient aware.

## 2020-07-21 NOTE — Telephone Encounter (Signed)
Patient is calling for pathology results from last visit with Stuart Tafeen, MD 

## 2020-07-22 ENCOUNTER — Other Ambulatory Visit: Payer: Self-pay

## 2020-07-22 ENCOUNTER — Ambulatory Visit
Admission: RE | Admit: 2020-07-22 | Discharge: 2020-07-22 | Disposition: A | Discharge status: Home / Self Care | Payer: PPO | Source: Ambulatory Visit | Attending: Radiation Oncology | Admitting: Radiation Oncology

## 2020-07-22 DIAGNOSIS — C541 Malignant neoplasm of endometrium: Secondary | ICD-10-CM | POA: Diagnosis not present

## 2020-07-25 ENCOUNTER — Telehealth: Payer: Self-pay

## 2020-07-25 ENCOUNTER — Ambulatory Visit
Admission: RE | Admit: 2020-07-25 | Discharge: 2020-07-25 | Disposition: A | Discharge status: Home / Self Care | Payer: PPO | Source: Ambulatory Visit | Attending: Radiation Oncology | Admitting: Radiation Oncology

## 2020-07-25 ENCOUNTER — Other Ambulatory Visit: Payer: Self-pay

## 2020-07-25 DIAGNOSIS — C541 Malignant neoplasm of endometrium: Secondary | ICD-10-CM | POA: Diagnosis not present

## 2020-07-25 LAB — URINALYSIS, COMPLETE (UACMP) WITH MICROSCOPIC
Bacteria, UA: NONE SEEN
Bilirubin Urine: NEGATIVE
Glucose, UA: >=500 mg/dL — AB
Ketones, ur: 5 mg/dL — AB
Nitrite: NEGATIVE
Protein, ur: 100 mg/dL — AB
Specific Gravity, Urine: 1.03 (ref 1.005–1.030)
WBC, UA: >50 WBC/hpf — ABNORMAL HIGH (ref 0–5)
pH: 5 (ref 5.0–8.0)

## 2020-07-25 NOTE — Telephone Encounter (Signed)
-----   Message from Lavonna Monarch, MD sent at 07/22/2020  9:12 AM EST ----- Schedule surgery with Dr. Darene Lamer

## 2020-07-25 NOTE — Telephone Encounter (Signed)
Path to patient she already has 1 hour in system

## 2020-07-27 LAB — URINE CULTURE

## 2020-08-02 DIAGNOSIS — M810 Age-related osteoporosis without current pathological fracture: Secondary | ICD-10-CM | POA: Diagnosis not present

## 2020-08-02 DIAGNOSIS — C541 Malignant neoplasm of endometrium: Secondary | ICD-10-CM | POA: Diagnosis not present

## 2020-08-02 DIAGNOSIS — Z86711 Personal history of pulmonary embolism: Secondary | ICD-10-CM | POA: Diagnosis not present

## 2020-08-02 DIAGNOSIS — E119 Type 2 diabetes mellitus without complications: Secondary | ICD-10-CM | POA: Diagnosis not present

## 2020-08-02 DIAGNOSIS — E1169 Type 2 diabetes mellitus with other specified complication: Secondary | ICD-10-CM | POA: Diagnosis not present

## 2020-08-02 DIAGNOSIS — Z862 Personal history of diseases of the blood and blood-forming organs and certain disorders involving the immune mechanism: Secondary | ICD-10-CM | POA: Diagnosis not present

## 2020-08-02 DIAGNOSIS — I2510 Atherosclerotic heart disease of native coronary artery without angina pectoris: Secondary | ICD-10-CM | POA: Diagnosis not present

## 2020-08-02 DIAGNOSIS — G4700 Insomnia, unspecified: Secondary | ICD-10-CM | POA: Diagnosis not present

## 2020-08-02 DIAGNOSIS — Z7984 Long term (current) use of oral hypoglycemic drugs: Secondary | ICD-10-CM | POA: Diagnosis not present

## 2020-08-02 DIAGNOSIS — C49A9 Gastrointestinal stromal tumor of other sites: Secondary | ICD-10-CM | POA: Diagnosis not present

## 2020-08-03 ENCOUNTER — Telehealth: Payer: Self-pay | Admitting: *Deleted

## 2020-08-03 NOTE — Telephone Encounter (Signed)
CALLED PATIENT TO INFORM OF NEW [name] FOR 08-09-20, LVM FOR A RETURN CALL

## 2020-08-04 ENCOUNTER — Ambulatory Visit: Payer: PPO | Admitting: Radiation Oncology

## 2020-08-04 ENCOUNTER — Ambulatory Visit: Payer: Self-pay | Admitting: Radiation Oncology

## 2020-08-05 DIAGNOSIS — M810 Age-related osteoporosis without current pathological fracture: Secondary | ICD-10-CM | POA: Diagnosis not present

## 2020-08-05 DIAGNOSIS — E785 Hyperlipidemia, unspecified: Secondary | ICD-10-CM | POA: Diagnosis not present

## 2020-08-05 DIAGNOSIS — I2510 Atherosclerotic heart disease of native coronary artery without angina pectoris: Secondary | ICD-10-CM | POA: Diagnosis not present

## 2020-08-05 DIAGNOSIS — E1169 Type 2 diabetes mellitus with other specified complication: Secondary | ICD-10-CM | POA: Diagnosis not present

## 2020-08-05 DIAGNOSIS — D509 Iron deficiency anemia, unspecified: Secondary | ICD-10-CM | POA: Diagnosis not present

## 2020-08-05 DIAGNOSIS — E1165 Type 2 diabetes mellitus with hyperglycemia: Secondary | ICD-10-CM | POA: Diagnosis not present

## 2020-08-05 DIAGNOSIS — G4700 Insomnia, unspecified: Secondary | ICD-10-CM | POA: Diagnosis not present

## 2020-08-08 ENCOUNTER — Telehealth: Payer: Self-pay | Admitting: *Deleted

## 2020-08-08 NOTE — Telephone Encounter (Signed)
CALLED PATIENT TO REMIND OF NEW HDR [place] FOR 08-09-20, LVM FOR A RETURN CALL

## 2020-08-09 ENCOUNTER — Ambulatory Visit
Admission: RE | Admit: 2020-08-09 | Discharge: 2020-08-09 | Disposition: A | Discharge status: Home / Self Care | Payer: PPO | Source: Ambulatory Visit | Attending: Radiation Oncology | Admitting: Radiation Oncology

## 2020-08-09 ENCOUNTER — Encounter: Payer: Self-pay | Admitting: Radiation Oncology

## 2020-08-09 ENCOUNTER — Other Ambulatory Visit: Payer: Self-pay

## 2020-08-09 VITALS — BP 143/56 | HR 56 | Temp 96.7°F | Resp 18 | Ht 64.5 in | Wt 134.4 lb

## 2020-08-09 DIAGNOSIS — Z7984 Long term (current) use of oral hypoglycemic drugs: Secondary | ICD-10-CM | POA: Insufficient documentation

## 2020-08-09 DIAGNOSIS — Z79899 Other long term (current) drug therapy: Secondary | ICD-10-CM | POA: Diagnosis not present

## 2020-08-09 DIAGNOSIS — C541 Malignant neoplasm of endometrium: Secondary | ICD-10-CM

## 2020-08-09 DIAGNOSIS — Z7982 Long term (current) use of aspirin: Secondary | ICD-10-CM | POA: Insufficient documentation

## 2020-08-09 DIAGNOSIS — Z923 Personal history of irradiation: Secondary | ICD-10-CM | POA: Diagnosis not present

## 2020-08-09 NOTE — Progress Notes (Signed)
Radiation Oncology         [phone] ________________________________  Name: [name] MRN: [id number]  Date: 08/09/2020  DOB: 04/01/1941  Vaginal Brachytherapy Procedure Note  CC: [name], MD Everitt Amber, MD    ICD-10-CM   1. Endometrial adenocarcinoma (HCC)  C54.1     Diagnosis: Recurrent stage IA, grade 1 endometrial cancer  Anticipated Radiation Treatment Dates: 08/09/2020, 08/17/2020, 08/25/2020, 08/31/2020  Narrative: She returns today for vaginal cylinder fitting. She underwent IMRT directed at the pelvis from 06/21/2020 to 07/25/20.   On review of systems, she reports improvement in her discomfort in the vaginal area. She denies vaginal bleeding or significant discharge.  She denies any hematuria.  She denies any further problems with diarrhea..  ALLERGIES: is allergic to ibuprofen.  Meds: Current Outpatient Medications  Medication Sig Dispense Refill  . Apoaequorin (PREVAGEN PO) Take 1 tablet by mouth daily.    Marland Kitchen aspirin 81 MG chewable tablet Chew 1 tablet by mouth daily.    . cholecalciferol (VITAMIN D) 1000 UNITS tablet Take 1,000 Units by mouth daily.     . Coenzyme Q10 (COQ-10) 100 MG CAPS Take 100 mg by mouth daily.    . empagliflozin (JARDIANCE) 10 MG TABS tablet Take 1 tablet by mouth daily.    . lipase/protease/amylase 24000-76000 units CPEP Take 1 capsule (24,000 Units total) by mouth 3 (three) times daily before meals. (Patient taking differently: Take 24,000 Units by mouth 2 (two) times daily with a meal.) 270 capsule 1  . Melatonin 10 MG TABS Take 10 mg by mouth at bedtime as needed.    . metFORMIN (GLUCOPHAGE-XR) 500 MG 24 hr tablet Take 2 tablets by mouth at bedtime.    . metoprolol tartrate (LOPRESSOR) 50 MG tablet Take 0.5 tablets by mouth 2 (two) times daily.    . simvastatin (ZOCOR) 20 MG tablet Take 20 mg by mouth every evening.    . sucralfate (CARAFATE) 1 g tablet Take 1 g by mouth 2 (two) times daily.    . vitamin E  180 MG (400 UNITS) capsule Take 400 Units by mouth daily.     No current facility-administered medications for this encounter.    Physical Findings: The patient is in no acute distress. Patient is alert and oriented.  height is 5' 4.5" (1.638 m) and weight is 134 lb 6 oz (61 kg). Her temporal temperature is 96.7 F (35.9 C) (abnormal). Her blood pressure is 143/56 (abnormal) and her pulse is 56 (abnormal). Her respiration is 18 and oxygen saturation is 97%.   No palpable cervical, supraclavicular or axillary lymphoadenopathy. The heart has a regular rate and rhythm. The lungs are clear to auscultation. Abdomen soft and non-tender.  On pelvic examination the external genitalia were unremarkable. A speculum exam was performed. Vaginal cuff intact, lesion palpated along the superiior posterior vaginal wall.  Lesion bleeds slightly with manipulation. overall smaller compared to pretreatment.. On bimanual exam there were no other pelvic masses appreciated.  Lab Findings: Lab Results  Component Value Date   WBC 5.4 10/24/2016   HGB 11.5 (L) 10/24/2016   HCT 36.1 10/24/2016   MCV 94.0 10/24/2016   PLT 240 10/24/2016    Radiographic Findings: No results found.  Impression: Recurrent stage IA, grade 1 endometrial cancer  Patient was fitted for a vaginal cylinder. The patient will be treated with a 2.0 cm diameter cylinder with a treatment length of 3.5-4.0 cm. This distended the vaginal vault without undue discomfort. The patient tolerated the  procedure well.  The patient was successfully fitted for a vaginal cylinder. The patient is appropriate to begin vaginal brachytherapy.   Plan: The patient will proceed with CT simulation and first vaginal brachytherapy treatment today.  Iridium 192 will be the high-dose-rate source.  _______________________________   Blair Promise, PhD, MD  This document serves as a record of services personally performed by Gery Pray, MD. It was created on  his behalf by Clerance Lav, a trained medical scribe. The creation of this record is based on the scribe's personal observations and the provider's statements to them. This document has been checked and approved by the attending provider.

## 2020-08-09 NOTE — Progress Notes (Signed)
  Radiation Oncology         [phone]) [phone] ________________________________  Name: [name] MRN: [id number]  Date: 08/09/2020  DOB: [birth year]  CC: [name], MD  [name],*  HDR BRACHYTHERAPY NOTE  DIAGNOSIS: Recurrent stage IA, grade 1 endometrial cancer   Simple treatment device note: Patient had construction of her custom vaginal cylinder. She will be treated with a 2.0 cm diameter segmented cylinder. This conforms to her anatomy without undue discomfort.  Vaginal brachytherapy procedure node: The patient was brought to the [place] suite. Identity was confirmed. All relevant records and images related to the planned course of therapy were reviewed. The patient freely provided informed written consent to proceed with treatment after reviewing the details related to the planned course of therapy. The consent form was witnessed and verified by the simulation staff. Then, the patient was set-up in a stable reproducible supine position for radiation therapy. Pelvic exam revealed the vaginal cuff to be intact . The patient's custom vaginal cylinder was placed in the proximal vagina. This was affixed to the CT/MR stabilization plate to prevent slippage. Patient tolerated the placement well.  Verification simulation note:  A fiducial marker was placed within the vaginal cylinder. An AP and lateral film was then obtained through the pelvis area. This documented accurate position of the vaginal cylinder for treatment.  HDR BRACHYTHERAPY TREATMENT  The remote afterloading device was affixed to the vaginal cylinder by catheter. Patient then proceeded to undergo her first high-dose-rate treatment directed at the proximal vagina. The patient was prescribed a dose of 6.0 gray to be delivered to the mucosal surface. Treatment length was 4.0 cm. Patient was treated with 1 channel using 9 dwell positions. Treatment time was 234.5 seconds. Iridium 192 was the high-dose-rate  source for treatment. The patient tolerated the treatment well. After completion of her therapy, a radiation survey was performed documenting return of the iridium source into the [facility] safe.   PLAN: The patient will return next week for her second high-dose-rate treatment. ________________________________    Blair Promise, PhD, MD  This document serves as a record of services personally performed by Gery Pray, MD. It was created on his behalf by Clerance Lav, a trained medical scribe. The creation of this record is based on the scribe's personal observations and the provider's statements to them. This document has been checked and approved by the attending provider.

## 2020-08-09 NOTE — Progress Notes (Signed)
GYN Location of Tumor / Histology:  Recurrent stage IA, grade 1 endometrial cancer  Biopsies revealed:  05/13/2020 FINAL MICROSCOPIC DIAGNOSIS:  A. VAGINAL BIOPSY:  - Adenocarcinoma, see comment.  COMMENT:  The history and morphology are consistent with endometrioid adenocarcinoma.   Past/Anticipated interventions by Gyn/Onc surgery, if any:  10/02/2016 Dr. Everitt Amber Robotic-assisted laparoscopic total hysterectomy with bilateral salpingoophorectomy, lysis of adhesions, exploratory laparotomy  Past/Anticipated interventions by medical oncology, if any: No referral placed at this time  Weight changes, if any: Patient denies any continued unintentional weight loss  Bowel/Bladder complaints, if any: Reports dysuria and irritation during IMRT, but reports symptoms have resolved. Denies any bowel concerns (had an episode of diarrhea during the last week of IMRT, but now reports occasional constipation which she manages with OTC medications)  Nausea/Vomiting, if any: Reports depressed appetite, but denies any issues with nausea or vomiting. States she is diligent about making herself eat at least 3 meals a day because of her diabetes   Pain issues, if any:  Denies pain, but does reports vaginal irritation and swelling   SAFETY ISSUES:  Prior radiation? Yes: IMRT directed at the pelvis from 06/21/2020 to 07/25/20.   Pacemaker/ICD? No  Possible current pregnancy? No--hysterectomy  Is the patient on methotrexate? No   Current Complaints / other details:   Nothing else of note

## 2020-08-10 ENCOUNTER — Ambulatory Visit: Payer: PPO | Admitting: Radiation Oncology

## 2020-08-16 ENCOUNTER — Telehealth: Payer: Self-pay | Admitting: *Deleted

## 2020-08-16 NOTE — Telephone Encounter (Signed)
CALLED PATIENT TO REMIND OF HDR [place] 08-17-20 @ 11 AM, SPOKE WITH PATIENT AND SHE IS AWARE OF THIS [place].

## 2020-08-17 ENCOUNTER — Ambulatory Visit
Admission: RE | Admit: 2020-08-17 | Discharge: 2020-08-17 | Disposition: A | Discharge status: Home / Self Care | Payer: PPO | Source: Ambulatory Visit | Attending: Radiation Oncology | Admitting: Radiation Oncology

## 2020-08-17 ENCOUNTER — Telehealth: Payer: Self-pay | Admitting: *Deleted

## 2020-08-17 ENCOUNTER — Other Ambulatory Visit: Payer: Self-pay

## 2020-08-17 DIAGNOSIS — C541 Malignant neoplasm of endometrium: Secondary | ICD-10-CM | POA: Diagnosis not present

## 2020-08-17 NOTE — Telephone Encounter (Signed)
CALLED PATIENT TO ASK ABOUT HAVING A HDR TX. ON 08-23-20, PATIENT AGREED TO THIS DATE AND TIME

## 2020-08-17 NOTE — Progress Notes (Signed)
  Radiation Oncology         [phone] ________________________________  Name: [name] MRN: [id number]  Date: 08/17/2020  DOB: 03/13/1941  CC: [name], MD  [name],*  HDR BRACHYTHERAPY NOTE  DIAGNOSIS: Recurrent stage IA, grade 1 endometrial cancer   Simple treatment device note: Patient had construction of her custom vaginal cylinder. She will be treated with a 2.0 cm diameter segmented cylinder. This conforms to her anatomy without undue discomfort.  Vaginal brachytherapy procedure node: The patient was brought to the [place] suite. Identity was confirmed. All relevant records and images related to the planned course of therapy were reviewed. The patient freely provided informed written consent to proceed with treatment after reviewing the details related to the planned course of therapy. The consent form was witnessed and verified by the simulation staff. Then, the patient was set-up in a stable reproducible supine position for radiation therapy. Pelvic exam revealed the vaginal cuff to be intact . The patient's custom vaginal cylinder was placed in the proximal vagina. This was affixed to the CT/MR stabilization plate to prevent slippage. Patient tolerated the placement well.  Verification simulation note:  A fiducial marker was placed within the vaginal cylinder. An AP and lateral film was then obtained through the pelvis area. This documented accurate position of the vaginal cylinder for treatment.  HDR BRACHYTHERAPY TREATMENT  The remote afterloading device was affixed to the vaginal cylinder by catheter. Patient then proceeded to undergo her second high-dose-rate treatment directed at the proximal vagina. The patient was prescribed a dose of 6.0 gray to be delivered to the mucosal surface. Treatment length was 4.0 cm. Patient was treated with 1 channel using 9 dwell positions. Treatment time was 252.9 seconds. Iridium 192 was the high-dose-rate  source for treatment. The patient tolerated the treatment well. After completion of her therapy, a radiation survey was performed documenting return of the iridium source into the [facility] safe.   PLAN: The patient will return next week for her third high-dose-rate treatment. ________________________________    Blair Promise, PhD, MD  This document serves as a record of services personally performed by Gery Pray, MD. It was created on his behalf by Clerance Lav, a trained medical scribe. The creation of this record is based on the scribe's personal observations and the provider's statements to them. This document has been checked and approved by the attending provider.

## 2020-08-18 ENCOUNTER — Telehealth: Payer: Self-pay | Admitting: *Deleted

## 2020-08-18 NOTE — Telephone Encounter (Signed)
CALLED PATIENT TO INFORM THAT HDR [place]. FOR 08-25-20 HAS BEEN MOVED TO 11 AM, LVM FOR A RETURN CALL

## 2020-08-22 ENCOUNTER — Telehealth: Payer: Self-pay | Admitting: *Deleted

## 2020-08-22 NOTE — Telephone Encounter (Signed)
CALLED PATIENT TO REMIND OF HDR [place] 08-23-20 @ 2 PM, SPOKE WITH PATIENT AND SHE IS AWARE OF THIS [place].

## 2020-08-23 ENCOUNTER — Other Ambulatory Visit: Payer: Self-pay

## 2020-08-23 ENCOUNTER — Ambulatory Visit
Admission: RE | Admit: 2020-08-23 | Discharge: 2020-08-23 | Disposition: A | Discharge status: Home / Self Care | Payer: PPO | Source: Ambulatory Visit | Attending: Radiation Oncology | Admitting: Radiation Oncology

## 2020-08-23 DIAGNOSIS — C541 Malignant neoplasm of endometrium: Secondary | ICD-10-CM | POA: Diagnosis not present

## 2020-08-23 NOTE — Progress Notes (Signed)
  Radiation Oncology         (336) [phone] ________________________________  Name: [name] MRN: [id number]  Date: 08/23/2020  DOB: 05-May-1941  CC: [name], MD  [name],*  HDR BRACHYTHERAPY NOTE  DIAGNOSIS: Recurrent stage IA, grade 1 endometrial cancer   Simple treatment device note: Patient had construction of her custom vaginal cylinder. She will be treated with a 2.0 cm diameter segmented cylinder. This conforms to her anatomy without undue discomfort.  Vaginal brachytherapy procedure node: The patient was brought to the [place] suite. Identity was confirmed. All relevant records and images related to the planned course of therapy were reviewed. The patient freely provided informed written consent to proceed with treatment after reviewing the details related to the planned course of therapy. The consent form was witnessed and verified by the simulation staff. Then, the patient was set-up in a stable reproducible supine position for radiation therapy. Pelvic exam revealed the vaginal cuff to be intact. The patient's custom vaginal cylinder was placed in the proximal vagina. This was affixed to the CT/MR stabilization plate to prevent slippage. Patient tolerated the placement well.  Verification simulation note:  A fiducial marker was placed within the vaginal cylinder. An AP and lateral film was then obtained through the pelvis area. This documented accurate position of the vaginal cylinder for treatment.  HDR BRACHYTHERAPY TREATMENT  The remote afterloading device was affixed to the vaginal cylinder by catheter. Patient then proceeded to undergo her third high-dose-rate treatment directed at the proximal vagina. The patient was prescribed a dose of 6.0 gray to be delivered to the mucosal surface. Treatment length was 4.0 cm. Patient was treated with 1 channel using 9 dwell positions. Treatment time was 267.5 seconds. Iridium 192 was the high-dose-rate  source for treatment. The patient tolerated the treatment well. After completion of her therapy, a radiation survey was performed documenting return of the iridium source into the [facility] safe.   PLAN: The patient will return in two days for her fourth and final high-dose-rate treatment. ________________________________    Blair Promise, PhD, MD  This document serves as a record of services personally performed by Gery Pray, MD. It was created on his behalf by Clerance Lav, a trained medical scribe. The creation of this record is based on the scribe's personal observations and the provider's statements to them. This document has been checked and approved by the attending provider.

## 2020-08-24 ENCOUNTER — Telehealth: Payer: Self-pay | Admitting: *Deleted

## 2020-08-24 NOTE — Telephone Encounter (Signed)
Called patient to remind of HDR Tx. for 08-25-20 @ 11 am, spoke with patient and she is aware of this tx.

## 2020-08-25 ENCOUNTER — Ambulatory Visit
Admission: RE | Admit: 2020-08-25 | Discharge: 2020-08-25 | Disposition: A | Discharge status: Home / Self Care | Payer: PPO | Source: Ambulatory Visit | Attending: Radiation Oncology | Admitting: Radiation Oncology

## 2020-08-25 ENCOUNTER — Other Ambulatory Visit: Payer: Self-pay

## 2020-08-25 ENCOUNTER — Encounter: Payer: Self-pay | Admitting: Radiation Oncology

## 2020-08-25 ENCOUNTER — Ambulatory Visit: Payer: Self-pay | Admitting: Radiation Oncology

## 2020-08-25 DIAGNOSIS — C541 Malignant neoplasm of endometrium: Secondary | ICD-10-CM

## 2020-08-25 NOTE — Progress Notes (Signed)
  Radiation Oncology         (336) [phone] ________________________________  Name: [name] MRN: [id number]  Date: 08/25/2020  DOB: 1941/02/25  CC: [name], MD  [name],*  HDR BRACHYTHERAPY NOTE  DIAGNOSIS: Recurrent stage IA, grade 1 endometrial cancer   Simple treatment device note: Patient had construction of her custom vaginal cylinder. She will be treated with a 2.0 cm diameter segmented cylinder. This conforms to her anatomy without undue discomfort.  Vaginal brachytherapy procedure node: The patient was brought to the [place] suite. Identity was confirmed. All relevant records and images related to the planned course of therapy were reviewed. The patient freely provided informed written consent to proceed with treatment after reviewing the details related to the planned course of therapy. The consent form was witnessed and verified by the simulation staff. Then, the patient was set-up in a stable reproducible supine position for radiation therapy. Pelvic exam revealed the vaginal cuff to be intact. The patient's custom vaginal cylinder was placed in the proximal vagina. This was affixed to the CT/MR stabilization plate to prevent slippage. Patient tolerated the placement well.  Verification simulation note:  A fiducial marker was placed within the vaginal cylinder. An AP and lateral film was then obtained through the pelvis area. This documented accurate position of the vaginal cylinder for treatment.  HDR BRACHYTHERAPY TREATMENT  The remote afterloading device was affixed to the vaginal cylinder by catheter. Patient then proceeded to undergo her fourth high-dose-rate treatment directed at the proximal vagina. The patient was prescribed a dose of 6.0 gray to be delivered to the mucosal surface. Treatment length was 4.0 cm. Patient was treated with 1 channel using 9 dwell positions. Treatment time was 272.7 seconds. Iridium 192 was the high-dose-rate  source for treatment. The patient tolerated the treatment well. After completion of her therapy, a radiation survey was performed documenting return of the iridium source into the [employer] safe.   PLAN: She has completed her planned course of external beam and brachytherapy for management of her recurrent endometrial cancer. She will return in one month for routine follow-up. ________________________________    Blair Promise, PhD, MD  This document serves as a record of services personally performed by Gery Pray, MD. It was created on his behalf by Clerance Lav, a trained medical scribe. The creation of this record is based on the scribe's personal observations and the provider's statements to them. This document has been checked and approved by the attending provider.

## 2020-08-31 ENCOUNTER — Ambulatory Visit: Payer: PPO | Admitting: Radiation Oncology

## 2020-09-19 ENCOUNTER — Encounter: Payer: Self-pay | Admitting: Radiation Oncology

## 2020-09-22 ENCOUNTER — Ambulatory Visit (INDEPENDENT_AMBULATORY_CARE_PROVIDER_SITE_OTHER): Payer: PPO | Admitting: Dermatology

## 2020-09-22 ENCOUNTER — Other Ambulatory Visit: Payer: Self-pay

## 2020-09-22 ENCOUNTER — Encounter: Payer: Self-pay | Admitting: Dermatology

## 2020-09-22 DIAGNOSIS — D099 Carcinoma in situ, unspecified: Secondary | ICD-10-CM

## 2020-09-22 DIAGNOSIS — D0461 Carcinoma in situ of skin of right upper limb, including shoulder: Secondary | ICD-10-CM | POA: Diagnosis not present

## 2020-09-22 DIAGNOSIS — D045 Carcinoma in situ of skin of trunk: Secondary | ICD-10-CM

## 2020-09-22 DIAGNOSIS — L570 Actinic keratosis: Secondary | ICD-10-CM

## 2020-09-22 NOTE — Patient Instructions (Signed)

## 2020-09-29 ENCOUNTER — Other Ambulatory Visit: Payer: Self-pay

## 2020-09-29 ENCOUNTER — Encounter: Payer: Self-pay | Admitting: Radiation Oncology

## 2020-09-29 ENCOUNTER — Telehealth: Payer: Self-pay | Admitting: *Deleted

## 2020-09-29 ENCOUNTER — Ambulatory Visit
Admission: RE | Admit: 2020-09-29 | Discharge: 2020-09-29 | Disposition: A | Discharge status: Home / Self Care | Payer: PPO | Source: Ambulatory Visit | Attending: Radiation Oncology | Admitting: Radiation Oncology

## 2020-09-29 VITALS — BP 140/48 | HR 65 | Temp 97.6°F | Resp 18 | Ht 65.0 in | Wt 133.8 lb

## 2020-09-29 DIAGNOSIS — R32 Unspecified urinary incontinence: Secondary | ICD-10-CM | POA: Insufficient documentation

## 2020-09-29 DIAGNOSIS — Z7984 Long term (current) use of oral hypoglycemic drugs: Secondary | ICD-10-CM | POA: Diagnosis not present

## 2020-09-29 DIAGNOSIS — R5383 Other fatigue: Secondary | ICD-10-CM | POA: Diagnosis not present

## 2020-09-29 DIAGNOSIS — Z79899 Other long term (current) drug therapy: Secondary | ICD-10-CM | POA: Diagnosis not present

## 2020-09-29 DIAGNOSIS — Z923 Personal history of irradiation: Secondary | ICD-10-CM | POA: Diagnosis not present

## 2020-09-29 DIAGNOSIS — N898 Other specified noninflammatory disorders of vagina: Secondary | ICD-10-CM | POA: Diagnosis not present

## 2020-09-29 DIAGNOSIS — C541 Malignant neoplasm of endometrium: Secondary | ICD-10-CM | POA: Diagnosis not present

## 2020-09-29 DIAGNOSIS — Z7982 Long term (current) use of aspirin: Secondary | ICD-10-CM | POA: Insufficient documentation

## 2020-09-29 HISTORY — DX: Personal history of irradiation: Z92.3

## 2020-09-29 NOTE — Progress Notes (Signed)
[name] is here today for follow up post radiation to the pelvic.  They completed their radiation on: 08/25/2020   Does the patient complain of any of the following:  . Pain: denies . Abdominal bloating: denies . Diarrhea/Constipation: reports occasional constipation . Nausea/Vomiting: denies . Vaginal Discharge: reports sticky discharge . Blood in Urine or Stool: denies . Urinary Issues (dysuria/incomplete emptying/ incontinence/ increased frequency/urgency): reports incontinence, dribbling, nocturia 0-1 time per night. Reports burning with urination. . Does patient report using vaginal dilator 2-3 times a week and/or sexually active 2-3 weeks: patient was given vaginal dilator at this visit and instructions were given on use . Post radiation skin changes: denies   Additional comments if applicable: none    Vitals:   09/29/20 0947  BP: (!) 140/48  Pulse: 65  Resp: 18  Temp: 97.6 F (36.4 C)  SpO2: 100%  Weight: 133 lb 12.8 oz (60.7 kg)  Height: 5\' 5"  (1.651 m)

## 2020-09-29 NOTE — Telephone Encounter (Signed)
Called patient to inform of fu with Dr. Denman George on 10-31-20 - arrival time- 1 pm, spoke with patient and she is aware of this appt.

## 2020-09-29 NOTE — Progress Notes (Signed)
Radiation Oncology         [phone] ________________________________  Name: [name] MRN: [id number]  Date: 09/29/2020  DOB: Oct 30, 1940  Follow-Up Visit Note  CC: [name], MD  [name],*    ICD-10-CM   1. Endometrial adenocarcinoma (HCC)  C54.1     Diagnosis: Recurrent stage IA, grade 1 endometrial cancer  Interval Since Last Radiation: One month  Radiation Treatment Dates: 06/21/2020 through 08/25/2020  Site: Vagina: pelvis Technique: IMRT Total Dose (Gy): 45/45 Dose per Fx (Gy): 1.8 Completed Fx: 25/25 Beam Energies: 6X  Site: Vagina: pelvis boost Technique: HDR-brachytherapy Total Dose (Gy): 24/24 Dose per Fx (Gy): 6 Completed Fx: 4/4 Beam Energies: Ir-192  Narrative:  The patient returns today for routine follow-up. No significant interval history since the end of treatment.  On review of systems, she reports ongoing fatigue but this is slowly improving she in addition notices some vaginal discharge. She denies pelvic pain or vaginal bleeding.  She has some urinary incontinence which she had prior to starting radiation therapy..                             ALLERGIES:  is allergic to ibuprofen.  Meds: Current Outpatient Medications  Medication Sig Dispense Refill  . Apoaequorin (PREVAGEN PO) Take 1 tablet by mouth daily.    . Ascorbic Acid (VITAMIN C) 1000 MG tablet Take 1,000 mg by mouth daily.    Marland Kitchen aspirin 81 MG chewable tablet Chew 1 tablet by mouth daily.    . cholecalciferol (VITAMIN D) 1000 UNITS tablet Take 1,000 Units by mouth daily.     . Coenzyme Q10 (COQ-10) 100 MG CAPS Take 100 mg by mouth daily.    . empagliflozin (JARDIANCE) 10 MG TABS tablet Take 1 tablet by mouth daily.    . lipase/protease/amylase 24000-76000 units CPEP Take 1 capsule (24,000 Units total) by mouth 3 (three) times daily before meals. (Patient taking differently: Take 24,000 Units by mouth 2 (two) times daily with a meal.) 270 capsule 1   . Melatonin 10 MG TABS Take 10 mg by mouth at bedtime as needed.    . metFORMIN (GLUCOPHAGE-XR) 500 MG 24 hr tablet Take 2 tablets by mouth at bedtime.    . metoprolol tartrate (LOPRESSOR) 50 MG tablet Take 0.5 tablets by mouth 2 (two) times daily.    . simvastatin (ZOCOR) 20 MG tablet Take 20 mg by mouth every evening.    . sucralfate (CARAFATE) 1 g tablet Take 1 g by mouth 2 (two) times daily.    . vitamin E 180 MG (400 UNITS) capsule Take 400 Units by mouth daily.    Marland Kitchen zinc gluconate 50 MG tablet Take 50 mg by mouth daily.    Glory Rosebush VERIO test strip daily.     No current facility-administered medications for this encounter.    Physical Findings: The patient is in no acute distress. Patient is alert and oriented.  height is 5\' 5"  (1.651 m) and weight is 133 lb 12.8 oz (60.7 kg). Her temperature is 97.6 F (36.4 C). Her blood pressure is 140/48 (abnormal) and her pulse is 65. Her respiration is 18 and oxygen saturation is 100%.   Lungs are clear to auscultation bilaterally. Heart has regular rate and rhythm. No palpable cervical, supraclavicular, or axillary adenopathy. Abdomen soft, non-tender, normal bowel sounds. Pelvic exam deferred in light of recent treatment.   Lab Findings: Lab Results  Component Value Date  WBC 5.4 10/24/2016   HGB 11.5 (L) 10/24/2016   HCT 36.1 10/24/2016   MCV 94.0 10/24/2016   PLT 240 10/24/2016    Radiographic Findings: No results found.  Impression: Recurrent stage IA, grade 1 endometrial cancer  The patient is recovering from the effects of radiation.    Plan: The patient will follow up with Dr. Denman George in  early June and with radiation oncology in September. she plans after exam with Dr. Denman George (assuming no plans for additional treatment) to spend most of her summer in [place] with her 4 sisters.   ____________________________________   Blair Promise, PhD, MD  This document serves as a record of services personally performed by Gery Pray, MD. It was created on his behalf by Clerance Lav, a trained medical scribe. The creation of this record is based on the scribe's personal observations and the provider's statements to them. This document has been checked and approved by the attending provider.

## 2020-09-29 NOTE — Patient Instructions (Signed)

## 2020-09-29 NOTE — Progress Notes (Incomplete)
  Patient Name: [name] MRN: [id number] DOB: Aug 31, 1940 Referring Physician: Fara Olden RUPASHREE (Profile Not Attached) Date of Service: 08/25/2020 [facility], [place]                                                        End Of Treatment Note  Diagnoses: C54.1-Malignant neoplasm of endometrium  Cancer Staging: Recurrent stage IA, grade 1 endometrial cancer  Intent: Curative  Radiation Treatment Dates: 06/21/2020 through 08/25/2020  Site: Vagina: pelvis Technique: IMRT Total Dose (Gy): 45/45 Dose per Fx (Gy): 1.8 Completed Fx: 25/25 Beam Energies: 6X  Site: Vagina: pelvis boost Technique: HDR-brachytherapy Total Dose (Gy): 24/24 Dose per Fx (Gy): 6 Completed Fx: 4/4 Beam Energies: Ir-192  Narrative: The patient tolerated radiation therapy. She did report mild abdominal discomfort, red vaginal discharge that improved, poor appetite, fatigue, vaginal dryness and itching, diarrhea controlled with Imodium, urinary urgency/inability to empty bladder, dysuria, and mild urinary incontinence. She denied constipation. She was given Viscous Xylocaine and antibiotic ointment for vaginal irritation.   Plan: The patient will follow-up with radiation oncology in one month.  ________________________________________________   Blair Promise, PhD, MD  This document serves as a record of services personally performed by Gery Pray, MD. It was created on his behalf by Clerance Lav, a trained medical scribe. The creation of this record is based on the scribe's personal observations and the provider's statements to them. This document has been checked and approved by the attending provider.

## 2020-10-02 ENCOUNTER — Encounter: Payer: Self-pay | Admitting: Dermatology

## 2020-10-02 NOTE — Progress Notes (Signed)
Follow-Up Visit   Subjective  [name] is a 80 y.o. female who presents for the following: Procedure (Sccx 3 ).  Multiple nonmelanoma skin cancers Location:  Duration:  Quality:  Associated Signs/Symptoms: Modifying Factors:  Severity:  Timing: Context:   Objective  Well appearing patient in no apparent distress; mood and affect are within normal limits. Objective  Mid Upper Back: Lesion identified by Dr.Jasman Pfeifle and nurse in room.    Objective  Right Elbow - Posterior: Lesion identified by Dr.Elsia Lasota and nurse in room.    Objective  Right Forearm - Posterior: Lesion identified by Dr.Quanta Robertshaw and nurse in room.    Objective  Right Malar Cheek: Several small actinic keratoses, one slightly larger on right cheek.    A focused examination was performed including Arms, back, head, neck.. Relevant physical exam findings are noted in the Assessment and Plan.   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5 Hemostasis achieved with:  ferric subsulfate Outcome: patient tolerated procedure well with no complications   Post-procedure details: sterile dressing applied and wound care instructions given   Dressing type: bandage and petrolatum   Additional details:  Wound innoculated with 5 fluorouracil solution.  Right Elbow -  Posterior  Destruction of lesion Complexity: simple   Destruction method: electrodesiccation and curettage   Informed consent: discussed and consent obtained   Timeout:  patient name, date of birth, surgical site, and procedure verified Anesthesia: the lesion was anesthetized in a standard fashion   Anesthetic:  1% lidocaine w/ epinephrine 1-100,000 local infiltration Curettage performed in three different directions: Yes     Electrodesiccation performed over the curetted area: No   Curettage cycles:  3 Lesion length (cm):  1.3 Lesion width (cm):  1.3 Margin per side (cm):  0 Final wound size (cm):  1.3 Hemostasis achieved with:  ferric subsulfate Post-procedure details: sterile dressing applied and wound care instructions given   Dressing type: bandage and petrolatum   Additional details:  Wound innoculated with 5 fluorouracil solution.  Right Forearm - Posterior  Destruction of lesion Complexity: simple   Destruction method: electrodesiccation and curettage   Informed consent: discussed and consent obtained   Timeout:  patient name, date of birth, surgical site, and procedure verified Anesthesia: the lesion was anesthetized in a standard fashion   Anesthetic:  1% lidocaine w/ epinephrine 1-100,000 local infiltration Curettage performed in three different directions: Yes   Electrodesiccation performed over the curetted area: Yes   Curettage cycles:  3 Lesion length (cm):  3 Lesion width (cm):  3 Margin per side (cm):  0 Final wound size (cm):  3 Hemostasis achieved with:  ferric subsulfate and electrodesiccation Outcome: patient tolerated procedure well with no complications   Post-procedure details: sterile dressing applied and wound care instructions given   Dressing type: bandage and pressure dressing   Additional details:  Wound  innoculated with 5 fluorouracil solution.  AK (actinic keratosis) Right Malar Cheek  Declined LN2 patient will return to clinic after she  gets back from [place] to have area treated.      I, Lavonna Monarch, MD, have reviewed all documentation for this visit.  The documentation on 10/02/20 for the exam, diagnosis, procedures, and orders are all accurate and complete.

## 2020-10-06 DIAGNOSIS — I2510 Atherosclerotic heart disease of native coronary artery without angina pectoris: Secondary | ICD-10-CM | POA: Diagnosis not present

## 2020-10-06 DIAGNOSIS — E1165 Type 2 diabetes mellitus with hyperglycemia: Secondary | ICD-10-CM | POA: Diagnosis not present

## 2020-10-06 DIAGNOSIS — G4700 Insomnia, unspecified: Secondary | ICD-10-CM | POA: Diagnosis not present

## 2020-10-06 DIAGNOSIS — E119 Type 2 diabetes mellitus without complications: Secondary | ICD-10-CM | POA: Diagnosis not present

## 2020-10-06 DIAGNOSIS — E785 Hyperlipidemia, unspecified: Secondary | ICD-10-CM | POA: Diagnosis not present

## 2020-10-06 DIAGNOSIS — D509 Iron deficiency anemia, unspecified: Secondary | ICD-10-CM | POA: Diagnosis not present

## 2020-10-06 DIAGNOSIS — M810 Age-related osteoporosis without current pathological fracture: Secondary | ICD-10-CM | POA: Diagnosis not present

## 2020-10-06 DIAGNOSIS — E1169 Type 2 diabetes mellitus with other specified complication: Secondary | ICD-10-CM | POA: Diagnosis not present

## 2020-10-26 ENCOUNTER — Encounter: Payer: Self-pay | Admitting: Gynecologic Oncology

## 2020-10-31 ENCOUNTER — Inpatient Hospital Stay: Payer: PPO | Attending: Gynecologic Oncology | Admitting: Gynecologic Oncology

## 2020-10-31 ENCOUNTER — Other Ambulatory Visit: Payer: Self-pay

## 2020-10-31 VITALS — BP 156/58 | HR 52 | Temp 97.8°F | Resp 16 | Ht 65.0 in | Wt 132.4 lb

## 2020-10-31 DIAGNOSIS — I2510 Atherosclerotic heart disease of native coronary artery without angina pectoris: Secondary | ICD-10-CM | POA: Diagnosis not present

## 2020-10-31 DIAGNOSIS — E785 Hyperlipidemia, unspecified: Secondary | ICD-10-CM | POA: Diagnosis not present

## 2020-10-31 DIAGNOSIS — Z923 Personal history of irradiation: Secondary | ICD-10-CM | POA: Insufficient documentation

## 2020-10-31 DIAGNOSIS — E7800 Pure hypercholesterolemia, unspecified: Secondary | ICD-10-CM | POA: Diagnosis not present

## 2020-10-31 DIAGNOSIS — Z90710 Acquired absence of both cervix and uterus: Secondary | ICD-10-CM | POA: Diagnosis not present

## 2020-10-31 DIAGNOSIS — Z90722 Acquired absence of ovaries, bilateral: Secondary | ICD-10-CM | POA: Insufficient documentation

## 2020-10-31 DIAGNOSIS — C7982 Secondary malignant neoplasm of genital organs: Secondary | ICD-10-CM | POA: Diagnosis not present

## 2020-10-31 DIAGNOSIS — Z79899 Other long term (current) drug therapy: Secondary | ICD-10-CM | POA: Diagnosis not present

## 2020-10-31 DIAGNOSIS — I10 Essential (primary) hypertension: Secondary | ICD-10-CM | POA: Diagnosis not present

## 2020-10-31 DIAGNOSIS — Z85828 Personal history of other malignant neoplasm of skin: Secondary | ICD-10-CM | POA: Diagnosis not present

## 2020-10-31 DIAGNOSIS — C541 Malignant neoplasm of endometrium: Secondary | ICD-10-CM | POA: Diagnosis not present

## 2020-10-31 DIAGNOSIS — Z7982 Long term (current) use of aspirin: Secondary | ICD-10-CM | POA: Insufficient documentation

## 2020-10-31 DIAGNOSIS — Z86718 Personal history of other venous thrombosis and embolism: Secondary | ICD-10-CM | POA: Insufficient documentation

## 2020-10-31 DIAGNOSIS — E1136 Type 2 diabetes mellitus with diabetic cataract: Secondary | ICD-10-CM | POA: Insufficient documentation

## 2020-10-31 DIAGNOSIS — Z7984 Long term (current) use of oral hypoglycemic drugs: Secondary | ICD-10-CM | POA: Diagnosis not present

## 2020-10-31 DIAGNOSIS — K219 Gastro-esophageal reflux disease without esophagitis: Secondary | ICD-10-CM | POA: Insufficient documentation

## 2020-10-31 NOTE — Patient Instructions (Addendum)
Dr Denman George is recommending a repeat PET scan to evaluate for completeness of response to therapy.  Dr Sondra Come will see you back in 3 months.  Dr Denman George will see you back in 6 months.   FOR THE PET SCAN: -NOTHING TO EAT OR DRINK 6 HOURS BEFORE. AVOID CARBS IN THE MEAL BEFORE THIS TIME. NO GUM OR CANDY DURING THE 6 HOUR PERIOD.

## 2020-10-31 NOTE — Progress Notes (Signed)
Consult Note: Gyn-Onc  Consult was requested by Dr. Delora Fuel for the evaluation of [name] 80 y.o. female  CC:  Chief Complaint  Patient presents with  . Endometrial adenocarcinoma [facility])    Assessment/Plan:  [name]  is a 80 y.o.  year old with concern for recurrent endometrial cancer at the vaginal cuff and posterior vaginal wall diagnosed in December, 2021 after initial staging surgery/diagnosis in May, 2018.  S/p salvage RT to pelvis and vagina. Complete clinical response. Recommend re-evaluation for radiographic response with PET scan.  Recommend 3 monthly surveillance examinations.   HPI: [name] is a 80 year old woman with a history of stage IA grade 1 endometrial cancer in 2018who was seen in consultation at the request of Dr Delora Fuel for evaluation of a vaginal mass.  The patient underwent surgical staging for her endometrial cancer in May 2018.  This included a robotic total hysterectomy with BSO and sentinel lymph node biopsy.  Her surgery was complicated by extensive adhesions from her prior Whipple procedure.  Final pathology revealed a grade 1 endometrioid endometrial adenocarcinoma that measured 4 cm.  There was inner half myometrial invasion and negative sentinel lymph nodes with no lymphovascular space invasion.  She was characterizes having low risk endometrial cancer and therefore no adjuvant therapy was recommended in accordance with NCCN guidelines. MMR testing was not performed at that time, but later requested revealing loss of expression of MLH1 and PMS2 (MSI high), and ER /PR strongly positive.  She received subsequent follow-up with her benign gynecologist and was lost to follow-up with [place].  In early December 2021 she began experiencing postmenopausal bleeding.  She reported this to her primary care doctor who attempted speculum exam which was unsuccessful due to discomfort.  The patient was then seen by Dr. Delora Fuel who performed  a limited speculum exam due to patient discomfort and recognized a vaginal mass.  She subsequently referred the patient to see GYN oncology.  Interval Hx:  The patient was seen by me on 05/13/21 and on vaginal examination the posterior left vaginal wall/apex was replaced by tumor. Biopsy was performed which confirmed recurrent endometrioid endometrial cancer.   She received restaging with PET scan on 06/18/20 which revealed hypermetabolism noted in the vaginal cuff and upper 1/3 of the vagina consistent with known neoplasm. No locoregional lymphadenopathy or distant metastatic disease was seen. This suggested a localized pelvic recurrence and treatment with salvage radiation was recommended.  She received salvage radiation between 06/21/2020 through 08/25/2020.  This included external beam radiation with IMRT to the vagina and pelvis, total dose 45 Gy.  Additionally received she received vaginal and pelvic boost with HDR brachytherapy total dose of 24 Gy.  She tolerated therapy fairly well though did report skin burn and vaginal irritation.    Post-treatment imaging has not yet been ordered.   Current Meds:  Outpatient Encounter Medications as of 10/31/2020  Medication Sig  . Apoaequorin (PREVAGEN PO) Take 1 tablet by mouth daily.  . Ascorbic Acid (VITAMIN C) 1000 MG tablet Take 1,000 mg by mouth daily.  . cholecalciferol (VITAMIN D) 1000 UNITS tablet Take 1,000 Units by mouth daily.   . Coenzyme Q10 (COQ-10) 100 MG CAPS Take 100 mg by mouth daily.  . empagliflozin (JARDIANCE) 10 MG TABS tablet Take 1 tablet by mouth daily.  . lipase/protease/amylase 24000-76000 units CPEP Take 1 capsule (24,000 Units total) by mouth 3 (three) times daily before meals. (Patient taking differently: Take 24,000 Units by  mouth 2 (two) times daily with a meal.)  . metFORMIN (GLUCOPHAGE-XR) 500 MG 24 hr tablet Take 2 tablets by mouth at bedtime.  . metoprolol tartrate (LOPRESSOR) 50 MG tablet Take 0.5 tablets by mouth 2  (two) times daily.  Glory Rosebush VERIO test strip daily.  . simvastatin (ZOCOR) 20 MG tablet Take 20 mg by mouth every evening.  . sucralfate (CARAFATE) 1 g tablet Take 1 g by mouth 2 (two) times daily.  . vitamin E 180 MG (400 UNITS) capsule Take 400 Units by mouth daily.  Marland Kitchen zinc gluconate 50 MG tablet Take 50 mg by mouth daily.  Marland Kitchen aspirin 81 MG chewable tablet Chew 1 tablet by mouth daily. (Patient not taking: Reported on 10/26/2020)  . Melatonin 10 MG TABS Take 10 mg by mouth at bedtime as needed. (Patient not taking: Reported on 10/26/2020)   No facility-administered encounter medications on file as of 10/31/2020.    Allergy:  Allergies  Allergen Reactions  . Ibuprofen Hives    Social Hx:   Social History   Socioeconomic History  . Marital status: Widowed    Spouse name: Not on file  . Number of children: 1  . Years of education: Not on file  . Highest education level: Not on file  Occupational History  . Occupation: Press photographer    Comment: retired  Tobacco Use  . Smoking status: Never Smoker  . Smokeless tobacco: Never Used  Vaping Use  . Vaping Use: Never used  Substance and Sexual Activity  . Alcohol use: No  . Drug use: No  . Sexual activity: Not Currently    Birth control/protection: Surgical  Other Topics Concern  . Not on file  Social History Narrative   ** Merged History Encounter **       Social Determinants of Health   Financial Resource Strain: Not on file  Food Insecurity: Not on file  Transportation Needs: Not on file  Physical Activity: Not on file  Stress: Not on file  Social Connections: Not on file  Intimate Partner Violence: Not on file    Past Surgical Hx:  Past Surgical History:  Procedure Laterality Date  . ABDOMINAL HYSTERECTOMY    . AUGMENTATION MAMMAPLASTY    . BREAST ENHANCEMENT SURGERY     since removed in 2016  . BREAST SURGERY    . CARDIAC CATHETERIZATION    . CATARACT EXTRACTION Bilateral 2021  . CHOLECYSTECTOMY    .  PANCREATICODUODENECTOMY  06/27/2010   whipple   . ROBOTIC ASSISTED TOTAL HYSTERECTOMY WITH BILATERAL SALPINGO OOPHERECTOMY Bilateral 10/02/2016   Procedure: XI ROBOTIC ASSISTED TOTAL HYSTERECTOMY WITH BILATERAL SALPINGO OOPHORECTOMY WITH SENTINAL LYMPH NODE BIOPSY LYSIS OF ADHESIONS, EXPLORATORY LAPAROTOMY;  Surgeon: Everitt Amber, MD;  Location: [facility];  Service: Gynecology;  Laterality: Bilateral;  . TUBAL LIGATION    . WHIPPLE PROCEDURE  2012    Past Medical Hx:  Past Medical History:  Diagnosis Date  . Anemia   . Atypical mole 05/15/2005   Left Mid Outer Back (slight to moderate)  . BCC (basal cell carcinoma of skin) 11/17/1991   Right Cheek (excision)  . BCC (basal cell carcinoma of skin) 05/30/1994   Left Upper Forehead (curet and 5FU)  . BCC (basal cell carcinoma of skin) 05/04/1997   Right upper Forehead/hairline (curet and excision)  . BCC (basal cell carcinoma of skin) 06/25/1997   Right Upper Forehead/hairline (+margin) (MOH's)  . BCC (basal cell carcinoma of skin) 11/12/2006   Left Cheek (curet and excision)  .  BCC (basal cell carcinoma of skin) 08/15/2009   Right Lower Leg (curet 5FU)  . BCE (basal cell epithelioma), leg, right   . Cataract 2021  . Colon polyps   . Coronary artery disease 2008  . Coronary atherosclerosis   . Diabetes mellitus without complication ([place])    pre-diabetic  . DVT of lower extremity (deep venous thrombosis) [facility]) March 2012  . endometrioid endometrial   . GI bleed 2009  . GIST (gastrointestinal stromal tumor), malignant (HCC)    T2N0  . H pylori ulcer 2001  . History of radiation therapy 06/21/20-07/25/20   endometrium, IMRT     Dr. Sondra Come  . History of radiation therapy 08/17/2020, 08/23/2020, 08/25/2020   vaginal brachytherapy     Dr Gery Pray  . Hypercholesteremia   . Hyperlipidemia   . Hypertension    per patient-on medication for preventative/has not diagnosed with HTN   . Insomnia   . Iron deficiency   . Joint pain   .  Osteopenia   . Reflux   . SCCA (squamous cell carcinoma) of skin 05/15/2005   Left Chest (in situ)  . SCCA (squamous cell carcinoma) of skin 10/24/2011   Left Side of Chest Med (in situ) (tx p bx)  . SCCA (squamous cell carcinoma) of skin 01/28/2019   Right Forearm inf (Keratoacanthoma) (tx p bx)  . Superficial basal cell carcinoma (BCC) 06/25/1995   Upper Forehead at scar (curet and 5FU)  . Ulcer    epigastric     Past Gynecological History:  See HPI No LMP recorded. Patient is postmenopausal.  Family Hx:  Family History  Problem Relation Age of Onset  . Diabetes Mother   . Dementia Mother   . Hypertension Mother   . Heart attack Father   . Heart attack Brother   . Cancer Brother        melanoma    Review of Systems:  Constitutional  Feels well,    ENT Normal appearing ears and nares bilaterally Skin/Breast  No rash, sores, jaundice, itching, dryness Cardiovascular  No chest pain, shortness of breath, or edema  Pulmonary  No cough or wheeze.  Gastro Intestinal  No nausea, vomitting, or diarrhoea. No bright red blood per rectum, no abdominal pain, change in bowel movement, or constipation.  Genito Urinary  No frequency, urgency, dysuria, + postmenopausal bleeding and pain with speculum exams Musculo Skeletal  No myalgia, arthralgia, joint swelling or pain  Neurologic  No weakness, numbness, change in gait,  Psychology  No depression, anxiety, insomnia.   Vitals:  Blood pressure (!) 156/58, pulse (!) 52, temperature 97.8 F (36.6 C), temperature source Tympanic, resp. rate 16, height 5' 5"  (1.651 m), weight 132 lb 6.4 oz (60.1 kg), SpO2 98 %.  Physical Exam: WD in NAD Neck  Supple NROM, without any enlargements.  Lymph Node Survey No cervical supraclavicular or inguinal adenopathy Cardiovascular  Well perfused peripheries Lungs  No increased WOB Skin  No rash/lesions/breakdown  Psychiatry  Alert and oriented to person, place, and time  Abdomen   Normoactive bowel sounds, abdomen soft, non-tender and nonobese without evidence of hernia.  Back No CVA tenderness Genito Urinary  Vulva/vagina: Normal external female genitalia.   No lesions. No discharge or bleeding.  Bladder/urethra:  No lesions or masses, well supported bladder  Vagina: There is no gross residual disease.  There is vaginal agglutination and shortening.  The vaginal walls are smooth with no palpable nodularity or pelvic masses.  There is some skin  changes from radiation to the external genitalia.  The vagina is very narrow Rectal  No palpable masses.  Extremities  No bilateral cyanosis, clubbing or edema.  Thereasa Solo, MD  10/31/2020, 2:02 PM

## 2020-11-10 ENCOUNTER — Other Ambulatory Visit: Payer: Self-pay

## 2020-11-10 ENCOUNTER — Ambulatory Visit (HOSPITAL_COMMUNITY)
Admission: RE | Admit: 2020-11-10 | Discharge: 2020-11-10 | Disposition: A | Discharge status: Home / Self Care | Payer: PPO | Source: Ambulatory Visit | Attending: Gynecologic Oncology | Admitting: Gynecologic Oncology

## 2020-11-10 DIAGNOSIS — E1165 Type 2 diabetes mellitus with hyperglycemia: Secondary | ICD-10-CM | POA: Diagnosis not present

## 2020-11-10 DIAGNOSIS — R911 Solitary pulmonary nodule: Secondary | ICD-10-CM | POA: Insufficient documentation

## 2020-11-10 DIAGNOSIS — C7982 Secondary malignant neoplasm of genital organs: Secondary | ICD-10-CM | POA: Insufficient documentation

## 2020-11-10 DIAGNOSIS — D509 Iron deficiency anemia, unspecified: Secondary | ICD-10-CM | POA: Diagnosis not present

## 2020-11-10 DIAGNOSIS — E119 Type 2 diabetes mellitus without complications: Secondary | ICD-10-CM | POA: Diagnosis not present

## 2020-11-10 DIAGNOSIS — I2510 Atherosclerotic heart disease of native coronary artery without angina pectoris: Secondary | ICD-10-CM | POA: Diagnosis not present

## 2020-11-10 DIAGNOSIS — E785 Hyperlipidemia, unspecified: Secondary | ICD-10-CM | POA: Diagnosis not present

## 2020-11-10 DIAGNOSIS — M810 Age-related osteoporosis without current pathological fracture: Secondary | ICD-10-CM | POA: Diagnosis not present

## 2020-11-10 DIAGNOSIS — C541 Malignant neoplasm of endometrium: Secondary | ICD-10-CM | POA: Diagnosis not present

## 2020-11-10 DIAGNOSIS — G4700 Insomnia, unspecified: Secondary | ICD-10-CM | POA: Diagnosis not present

## 2020-11-10 DIAGNOSIS — E1169 Type 2 diabetes mellitus with other specified complication: Secondary | ICD-10-CM | POA: Diagnosis not present

## 2020-11-10 DIAGNOSIS — Z8542 Personal history of malignant neoplasm of other parts of uterus: Secondary | ICD-10-CM | POA: Diagnosis not present

## 2020-11-10 LAB — GLUCOSE, CAPILLARY: Glucose-Capillary: 140 mg/dL — ABNORMAL HIGH (ref 70–99)

## 2020-11-10 MED ORDER — FLUDEOXYGLUCOSE F - 18 (FDG) INJECTION
6.8000 | Freq: Once | INTRAVENOUS | Status: AC
  Administered 2020-11-10: 6.7 via INTRAVENOUS

## 2020-11-14 ENCOUNTER — Telehealth: Payer: Self-pay

## 2020-11-14 NOTE — Telephone Encounter (Signed)
Told [name] that the PET scan showed no evidence of residual cancer in the vagina. Anew pulmonary nodule was noted in the left upper lobe. This will be presented and lung tumor board on Thursday 11-17-20  per Joylene John, NP.  She will be called with recommendations from the tumor board review. Pt verbalized understanding.

## 2020-11-17 ENCOUNTER — Other Ambulatory Visit: Payer: Self-pay | Admitting: *Deleted

## 2020-11-17 ENCOUNTER — Other Ambulatory Visit: Payer: Self-pay | Admitting: Gynecologic Oncology

## 2020-11-17 DIAGNOSIS — R911 Solitary pulmonary nodule: Secondary | ICD-10-CM

## 2020-11-17 DIAGNOSIS — C541 Malignant neoplasm of endometrium: Secondary | ICD-10-CM

## 2020-11-17 NOTE — Progress Notes (Signed)
Patient discussed at lung tumor conference. Recommendation for referral to thoracic surgery for further evaluation. The patient states she is in [place] until the end of August and the referral will have to wait until her return.

## 2020-11-17 NOTE — Telephone Encounter (Signed)
Spoke with [name] regarding lung tumor board recommendations. They are recommending a referral to Dr. Roxan Hockey a thoracic surgeon. Patient wanted to know the size and location of lung nodule. Informed it is a 12 mm nodule in the left upper lobe. Patient verbalized understanding. She requests an appointment in September as she is in [place] until August 22 nd. Let her know we are working on the referral now.

## 2020-11-17 NOTE — Progress Notes (Signed)
The proposed treatment discussed in cancer conference 11/17/20 is for discussion purpose only and is not a binding recommendation.  The patient was not physically examined nor present for their treatment options. Therefore, final treatment plans cannot be decided.

## 2020-11-18 ENCOUNTER — Telehealth: Payer: Self-pay

## 2020-11-18 NOTE — Telephone Encounter (Signed)
Spoke with Ms. Cassetta and scheduled a 10:00 am phone visit with Dr. Denman George on 11/22/20. Patient is in agreement of date and time. She states she's in [place] and doesn't have the best phone reception right now.

## 2020-11-18 NOTE — Telephone Encounter (Signed)
Left message for [name] requesting a return call to schedule a phone visit with Dr. Denman George for next week.

## 2020-11-22 ENCOUNTER — Other Ambulatory Visit: Payer: Self-pay | Admitting: Gynecologic Oncology

## 2020-11-22 ENCOUNTER — Inpatient Hospital Stay (HOSPITAL_BASED_OUTPATIENT_CLINIC_OR_DEPARTMENT_OTHER): Payer: PPO | Admitting: Gynecologic Oncology

## 2020-11-22 DIAGNOSIS — C541 Malignant neoplasm of endometrium: Secondary | ICD-10-CM

## 2020-11-22 DIAGNOSIS — C7982 Secondary malignant neoplasm of genital organs: Secondary | ICD-10-CM

## 2020-11-22 DIAGNOSIS — R911 Solitary pulmonary nodule: Secondary | ICD-10-CM

## 2020-11-22 NOTE — Progress Notes (Signed)
Gynecologic Oncology Telehealth Follow-up Note  I connected with [name] on 11/22/20 at 10:00 AM EDT by telephone and verified that I am speaking with the correct person using two identifiers.  I discussed the limitations, risks, security and privacy concerns of performing an evaluation and management service by telemedicine and the availability of in-person appointments. I also discussed with the patient that there may be a patient responsible charge related to this service. The patient expressed understanding and agreed to proceed.  Other persons participating in the visit and their role in the encounter: none.  Patient's location: [place] Provider's location: [facility]  Chief Complaint:  Chief Complaint  Patient presents with   pulmonary nodule   recurrent endometrial cancer     Assessment/Plan:  [name]  is a 80 y.o.  year old with history of recurrent endometrioid endometrial cancer at the vaginal cuff and posterior vaginal wall (MMR deficient, ER/PR positive) diagnosed in December, 2021 after initial staging surgery/diagnosis in May, 2018. S/p salvage RT to pelvis and vagina. Complete clinical response. New radiographic finding on PET (June, 2022) concerning for left upper lobe lung metastasis.  Presented at thoracic multidisciplinary conference: recommended to undergo evaluation with thoracic surgery (Dr Roxan Hockey). Patient is in [place] at her [place], and returns on the last week of August. She would prefer to not return and if this needed sooner evaluation she would want to be treated in [place]. I explained that coordination of care in [place] would take at least 2 months until we were ready to begin intervention, therefore there is no added benefit to attempting to establish care there (additionally she is in a remote location of [place]).   I am recommending that she return at the end of August as planned and immediately have  re-evaluation with repeat imaging (eg PET or chest CT). This will assess if there has been any progression / change or new lesions. She will then see Dr Roxan Hockey (will plan for this visit within days of her scan) to discuss definitive diagnostic work-up/intervention.  If this lesion is metastatic, would recommend systemic treatment (eg hormonal therapy), in addition to consideration for surgical resection (for an isolated lesion). If repeat imaging shows multifocal disease, would not recommend surgical intervention, but rather, proceed with systemic therapy (carb/tax, followed by pembro for persistent or subsequent recurrences given MMRd disease).   HPI: [name] is a 80 year old woman with a history of stage IA grade 1 endometrial cancer in 2018 who was seen in consultation at the request of Dr Delora Fuel for evaluation of a vaginal mass.  The patient underwent surgical staging for her endometrial cancer in May 2018.  This included a robotic total hysterectomy with BSO and sentinel lymph node biopsy.  Her surgery was complicated by extensive adhesions from her prior Whipple procedure.  Final pathology revealed a grade 1 endometrioid endometrial adenocarcinoma that measured 4 cm.  There was inner half myometrial invasion and negative sentinel lymph nodes with no lymphovascular space invasion.  She was characterizes having low risk endometrial cancer and therefore no adjuvant therapy was recommended in accordance with NCCN guidelines. MMR testing was not performed at that time, but later requested revealing loss of expression of MLH1 and PMS2 (MSI high), and ER /PR strongly positive.  She received subsequent follow-up with her benign gynecologist and was lost to follow-up with [place].  In early December 2021 she began experiencing postmenopausal bleeding.  She reported this to her primary  care doctor who attempted speculum exam which was unsuccessful due to discomfort.  The patient was then seen  by Dr. Delora Fuel who performed a limited speculum exam due to patient discomfort and recognized a vaginal mass.  She subsequently referred the patient to see GYN oncology.  The patient was then seen by me on 05/13/21 and on vaginal examination the posterior left vaginal wall/apex was replaced by tumor. Biopsy was performed which confirmed recurrent endometrioid endometrial cancer.   She received restaging with PET scan on 06/18/20 which revealed hypermetabolism noted in the vaginal cuff and upper 1/3 of the vagina consistent with known neoplasm. No locoregional lymphadenopathy or distant metastatic disease was seen. This suggested a localized pelvic recurrence and treatment with salvage radiation was recommended.  She received salvage radiation between 06/21/2020 through 08/25/2020.  This included external beam radiation with IMRT to the vagina and pelvis, total dose 45 Gy.  Additionally received she received vaginal and pelvic boost with HDR brachytherapy total dose of 24 Gy.  Interval Hx:  After being seeing for post-treatment surveillance in June, 2022, she was scheduled to have a post-treatment surveillance/evaluation PET scan on 11/11/20. This revealed complete resolution at the vaginal/pelvic recurrence site, however there was a new 12 mm posterior left upper lobe pulmonary nodule/metastasis seen.  The patient then immediately travelled to [place] where she had planned to spend 3 months [place]   Her case was presented at the thoracic multidisciplinary conference and the recommendation was made for referral/consultation with Dr Roxan Hockey from Thoracic Surgery.  I contacted the patient by phone to discuss the PET findings and thoracic conference recommendations.   Current Meds:  Outpatient Encounter Medications as of 11/22/2020  Medication Sig   Apoaequorin (PREVAGEN PO) Take 1 tablet by mouth daily.   Ascorbic Acid (VITAMIN C) 1000 MG tablet Take  1,000 mg by mouth daily.   aspirin 81 MG chewable tablet Chew 1 tablet by mouth daily. (Patient not taking: Reported on 10/26/2020)   cholecalciferol (VITAMIN D) 1000 UNITS tablet Take 1,000 Units by mouth daily.    Coenzyme Q10 (COQ-10) 100 MG CAPS Take 100 mg by mouth daily.   empagliflozin (JARDIANCE) 10 MG TABS tablet Take 1 tablet by mouth daily.   lipase/protease/amylase 24000-76000 units CPEP Take 1 capsule (24,000 Units total) by mouth 3 (three) times daily before meals. (Patient taking differently: Take 24,000 Units by mouth 2 (two) times daily with a meal.)   Melatonin 10 MG TABS Take 10 mg by mouth at bedtime as needed. (Patient not taking: Reported on 10/26/2020)   metFORMIN (GLUCOPHAGE-XR) 500 MG 24 hr tablet Take 2 tablets by mouth at bedtime.   metoprolol tartrate (LOPRESSOR) 50 MG tablet Take 0.5 tablets by mouth 2 (two) times daily.   ONETOUCH VERIO test strip daily.   simvastatin (ZOCOR) 20 MG tablet Take 20 mg by mouth every evening.   sucralfate (CARAFATE) 1 g tablet Take 1 g by mouth 2 (two) times daily.   vitamin E 180 MG (400 UNITS) capsule Take 400 Units by mouth daily.   zinc gluconate 50 MG tablet Take 50 mg by mouth daily.   No facility-administered encounter medications on file as of 11/22/2020.    Allergy:  Allergies  Allergen Reactions   Ibuprofen Hives    Social Hx:   Social History   Socioeconomic History   Marital status: Widowed    Spouse name: Not on file   Number of children: 1  Years of education: Not on file   Highest education level: Not on file  Occupational History   Occupation: accounting    Comment: retired  Tobacco Use   Smoking status: Never   Smokeless tobacco: Never  Vaping Use   Vaping Use: Never used  Substance and Sexual Activity   Alcohol use: No   Drug use: No   Sexual activity: Not Currently    Birth control/protection: Surgical  Other Topics Concern   Not on file  Social History Narrative   ** Merged History Encounter  **       Social Determinants of Health   Financial Resource Strain: Not on file  Food Insecurity: Not on file  Transportation Needs: Not on file  Physical Activity: Not on file  Stress: Not on file  Social Connections: Not on file  Intimate Partner Violence: Not on file    Past Surgical Hx:  Past Surgical History:  Procedure Laterality Date   ABDOMINAL HYSTERECTOMY     AUGMENTATION MAMMAPLASTY     BREAST ENHANCEMENT SURGERY     since removed in 2016   [place] Bilateral 2021   CHOLECYSTECTOMY     PANCREATICODUODENECTOMY  06/27/2010   whipple    ROBOTIC ASSISTED TOTAL HYSTERECTOMY WITH BILATERAL SALPINGO OOPHERECTOMY Bilateral 10/02/2016   Procedure: XI ROBOTIC ASSISTED TOTAL HYSTERECTOMY WITH BILATERAL SALPINGO OOPHORECTOMY WITH SENTINAL LYMPH NODE BIOPSY LYSIS OF ADHESIONS, EXPLORATORY LAPAROTOMY;  Surgeon: Everitt Amber, MD;  Location: [facility];  Service: Gynecology;  Laterality: Bilateral;   TUBAL LIGATION     WHIPPLE PROCEDURE  2012    Past Medical Hx:  Past Medical History:  Diagnosis Date   Anemia    Atypical mole 05/15/2005   Left Mid Outer Back (slight to moderate)   BCC (basal cell carcinoma of skin) 11/17/1991   Right Cheek (excision)   BCC (basal cell carcinoma of skin) 05/30/1994   Left Upper Forehead (curet and 5FU)   BCC (basal cell carcinoma of skin) 05/04/1997   Right upper Forehead/hairline (curet and excision)   BCC (basal cell carcinoma of skin) 06/25/1997   Right Upper Forehead/hairline (+margin) (MOH's)   BCC (basal cell carcinoma of skin) 11/12/2006   Left Cheek (curet and excision)   BCC (basal cell carcinoma of skin) 08/15/2009   Right Lower Leg (curet 5FU)   BCE (basal cell epithelioma), leg, right    Cataract 2021   Colon polyps    Coronary artery disease 2008   Coronary atherosclerosis    Diabetes mellitus without complication ([place])    pre-diabetic   DVT of lower extremity (deep  venous thrombosis) ([place]) March 2012   endometrioid endometrial    GI bleed 2009   GIST (gastrointestinal stromal tumor), malignant ([place])    T2N0   H pylori ulcer 2001   History of radiation therapy 06/21/20-07/25/20   endometrium, IMRT     Dr. Sondra Come   History of radiation therapy 08/17/2020, 08/23/2020, 08/25/2020   vaginal brachytherapy     Dr Gery Pray   Hypercholesteremia    Hyperlipidemia    Hypertension    per patient-on medication for preventative/has not diagnosed with HTN    Insomnia    Iron deficiency    Joint pain    Osteopenia    Reflux    SCCA (squamous cell carcinoma) of skin 05/15/2005   Left Chest (in situ)   SCCA (squamous cell carcinoma) of skin 10/24/2011   Left  Side of Chest Med (in situ) (tx p bx)   SCCA (squamous cell carcinoma) of skin 01/28/2019   Right Forearm inf (Keratoacanthoma) (tx p bx)   Superficial basal cell carcinoma (BCC) 06/25/1995   Upper Forehead at scar (curet and 5FU)   Ulcer    epigastric     Past Gynecological History:  See HPI No LMP recorded. Patient is postmenopausal.  Family Hx:  Family History  Problem Relation Age of Onset   Diabetes Mother    Dementia Mother    Hypertension Mother    Heart attack Father    Heart attack Brother    Cancer Brother        melanoma    Review of Systems:  Constitutional  Feels well,    ENT Normal appearing ears and nares bilaterally Skin/Breast  No rash, sores, jaundice, itching, dryness Cardiovascular  No chest pain, shortness of breath, or edema  Pulmonary  No cough or wheeze.  Gastro Intestinal  No nausea, vomitting, or diarrhoea. No bright red blood per rectum, no abdominal pain, change in bowel movement, or constipation.  Genito Urinary  No frequency, urgency, dysuria, no bleeding  Musculo Skeletal  No myalgia, arthralgia, joint swelling or pain  Neurologic  No weakness, numbness, change in gait,  Psychology  No depression, anxiety, insomnia.   Vitals:  There were no  vitals taken for this visit.  Physical Exam: Deferred due to phone visit.  I discussed the assessment and treatment plan with the patient. The patient was provided with an opportunity to ask questions and all were answered. The patient agreed with the plan and demonstrated an understanding of the instructions.   The patient was advised to call back or see an in-person evaluation if the symptoms worsen or if the condition fails to improve as anticipated.   I provided 30 minutes of non face-to-face telephone visit time during this encounter, and > 50% was spent counseling as documented under my assessment & plan.   Thereasa Solo, MD  11/22/2020, 11:27 AM

## 2020-11-22 NOTE — Progress Notes (Signed)
CT chest ordered per Dr. Serita Grit recommendations prior to the patient's evaluation with cardiothoracic surgery.

## 2020-11-25 ENCOUNTER — Telehealth: Payer: Self-pay | Admitting: *Deleted

## 2020-11-25 NOTE — Telephone Encounter (Signed)
Per order scheduled a CT scan for 9/1 with a lab appt before. Called and left the patient a message to call the office back

## 2021-01-05 DIAGNOSIS — E1169 Type 2 diabetes mellitus with other specified complication: Secondary | ICD-10-CM | POA: Diagnosis not present

## 2021-01-05 DIAGNOSIS — C541 Malignant neoplasm of endometrium: Secondary | ICD-10-CM | POA: Diagnosis not present

## 2021-01-05 DIAGNOSIS — M810 Age-related osteoporosis without current pathological fracture: Secondary | ICD-10-CM | POA: Diagnosis not present

## 2021-01-05 DIAGNOSIS — I2510 Atherosclerotic heart disease of native coronary artery without angina pectoris: Secondary | ICD-10-CM | POA: Diagnosis not present

## 2021-01-05 DIAGNOSIS — Z8542 Personal history of malignant neoplasm of other parts of uterus: Secondary | ICD-10-CM | POA: Diagnosis not present

## 2021-01-05 DIAGNOSIS — E1165 Type 2 diabetes mellitus with hyperglycemia: Secondary | ICD-10-CM | POA: Diagnosis not present

## 2021-01-05 DIAGNOSIS — D509 Iron deficiency anemia, unspecified: Secondary | ICD-10-CM | POA: Diagnosis not present

## 2021-01-05 DIAGNOSIS — E785 Hyperlipidemia, unspecified: Secondary | ICD-10-CM | POA: Diagnosis not present

## 2021-01-05 DIAGNOSIS — G4700 Insomnia, unspecified: Secondary | ICD-10-CM | POA: Diagnosis not present

## 2021-01-05 DIAGNOSIS — E119 Type 2 diabetes mellitus without complications: Secondary | ICD-10-CM | POA: Diagnosis not present

## 2021-01-19 ENCOUNTER — Telehealth: Payer: Self-pay | Admitting: *Deleted

## 2021-01-19 NOTE — Telephone Encounter (Signed)
Patient called and left a message stating "I got a call from Dr Serita Grit to call for scheduling. I call and talked with the cancer center scheduling and they didn't know why I was called." Returned the patient's call and left a message that the call was in error and to disregard the message

## 2021-01-25 ENCOUNTER — Telehealth: Payer: Self-pay

## 2021-01-25 NOTE — Telephone Encounter (Signed)
Returning call to [facility] regarding her CT scan and PET scan. Per Dr Denman George patient is to have both scans. The CT scan will evaluate the chest and cardiothoracic area and the PET scan will evaluate the abdomen and pelvis. Instructed patient that someone will contact her with appointment details for her PET scan. Patient verbalized understanding and will call with questions or concerns.

## 2021-01-26 ENCOUNTER — Other Ambulatory Visit: Payer: Self-pay

## 2021-01-26 ENCOUNTER — Encounter (HOSPITAL_COMMUNITY): Payer: Self-pay

## 2021-01-26 ENCOUNTER — Telehealth: Payer: Self-pay | Admitting: *Deleted

## 2021-01-26 ENCOUNTER — Ambulatory Visit (HOSPITAL_COMMUNITY)
Admission: RE | Admit: 2021-01-26 | Discharge: 2021-01-26 | Disposition: A | Discharge status: Home / Self Care | Payer: PPO | Source: Ambulatory Visit | Attending: Gynecologic Oncology | Admitting: Gynecologic Oncology

## 2021-01-26 ENCOUNTER — Inpatient Hospital Stay: Payer: PPO | Attending: Gynecologic Oncology

## 2021-01-26 DIAGNOSIS — R911 Solitary pulmonary nodule: Secondary | ICD-10-CM | POA: Diagnosis not present

## 2021-01-26 DIAGNOSIS — C541 Malignant neoplasm of endometrium: Secondary | ICD-10-CM | POA: Diagnosis not present

## 2021-01-26 DIAGNOSIS — I700 Atherosclerosis of aorta: Secondary | ICD-10-CM | POA: Diagnosis not present

## 2021-01-26 LAB — BASIC METABOLIC PANEL
Anion gap: 10 (ref 5–15)
BUN: 17 mg/dL (ref 8–23)
CO2: 22 mmol/L (ref 22–32)
Calcium: 9.4 mg/dL (ref 8.9–10.3)
Chloride: 108 mmol/L (ref 98–111)
Creatinine, Ser: 0.78 mg/dL (ref 0.44–1.00)
GFR, Estimated: >60 mL/min (ref >60)
Glucose, Bld: 196 mg/dL — ABNORMAL HIGH (ref 70–99)
Potassium: 4.2 mmol/L (ref 3.5–5.1)
Sodium: 140 mmol/L (ref 135–145)

## 2021-01-26 MED ORDER — IOHEXOL 350 MG/ML SOLN
75.0000 mL | Freq: Once | INTRAVENOUS | Status: AC | PRN
  Administered 2021-01-26: 60 mL via INTRAVENOUS

## 2021-01-26 NOTE — Telephone Encounter (Signed)
Called and scheduled the patient for a PET scan. Called and left the patient with appt date/time and instructions.

## 2021-01-31 ENCOUNTER — Encounter: Payer: Self-pay | Admitting: Thoracic Surgery (Cardiothoracic Vascular Surgery)

## 2021-01-31 ENCOUNTER — Institutional Professional Consult (permissible substitution): Payer: PPO | Admitting: Thoracic Surgery (Cardiothoracic Vascular Surgery)

## 2021-01-31 ENCOUNTER — Other Ambulatory Visit: Payer: Self-pay | Admitting: *Deleted

## 2021-01-31 ENCOUNTER — Other Ambulatory Visit: Payer: Self-pay

## 2021-01-31 VITALS — BP 121/53 | HR 43 | Resp 20 | Ht 65.0 in | Wt 128.0 lb

## 2021-01-31 DIAGNOSIS — R911 Solitary pulmonary nodule: Secondary | ICD-10-CM | POA: Diagnosis not present

## 2021-01-31 NOTE — Progress Notes (Signed)
PCP is [name], MD Referring Provider is Dorothyann Gibbs, NP  Chief Complaint  Patient presents with   Lung Lesion    Surgical consult, PET Scan  11/10/20, Chest CT 01/26/21    HPI: [name] is sent for consultation regarding a left upper lobe lung nodule.  [name] is a 80 year old woman with a history of endometrial cancer, adenocarcinoma the vaginal cuff, DVT after surgery, malignant gastrointestinal stromal tumor, Whipple procedure, type 2 diabetes, multiple basal cell carcinomas, coronary atherosclerosis, hypertension, hyperlipidemia, iron deficiency anemia, arthritis, and reflux.  She had a hysterectomy in 2018 for endometrial cancer.  That was complicated by a DVT in her right leg.  More recently she was found to have a recurrence of the vaginal cuff.  That was treated with radiation.  She had a PET/CT in June which showed a new 9 x 12 millimeter left upper lobe lung nodule, which was hypermetabolic with an SUV of 5.  There was no evidence of a lesion in that area on her prior PET from January.  She was in [place] visiting her sister and recently returned to [place].  She had a CT of the chest which showed the nodule had increased in size.  She is a lifelong non-smoker.  She denied having any fevers or chills.  She says she will occasionally have a night sweat about once every 2 months.  She is not having any cough, wheezing, or shortness of breath.  Denies chest pain, pressure, or tightness.  Her appetite is fair.  She has not had any recent weight loss although she is lost about 40 pounds over the past couple of years.  Zubrod Score: At the time of surgery this patient's most appropriate activity status/level should be described as: [x]     0    Normal activity, no symptoms []     1    Restricted in physical strenuous activity but ambulatory, able to do out light work []     2    Ambulatory and capable of self care, unable to do work activities, up and about  >50 % of waking hours                              []     3    Only limited self care, in bed greater than 50% of waking hours []     4    Completely disabled, no self care, confined to bed or chair []     5    Moribund  Past Medical History:  Diagnosis Date   Anemia    Atypical mole 05/15/2005   Left Mid Outer Back (slight to moderate)   BCC (basal cell carcinoma of skin) 11/17/1991   Right Cheek (excision)   BCC (basal cell carcinoma of skin) 05/30/1994   Left Upper Forehead (curet and 5FU)   BCC (basal cell carcinoma of skin) 05/04/1997   Right upper Forehead/hairline (curet and excision)   BCC (basal cell carcinoma of skin) 06/25/1997   Right Upper Forehead/hairline (+margin) (MOH's)   BCC (basal cell carcinoma of skin) 11/12/2006   Left Cheek (curet and excision)   BCC (basal cell carcinoma of skin) 08/15/2009   Right Lower Leg (curet 5FU)   BCE (basal cell epithelioma), leg, right    Cataract 2021   Colon polyps    Coronary artery disease 2008   Coronary atherosclerosis    Diabetes mellitus without complication ([place])  pre-diabetic   DVT of lower extremity (deep venous thrombosis) ([place]) March 2012   endometrioid endometrial    GI bleed 2009   GIST (gastrointestinal stromal tumor), malignant ([place])    T2N0   H pylori ulcer 2001   History of radiation therapy 06/21/20-07/25/20   endometrium, IMRT     Dr. Sondra Come   History of radiation therapy 08/17/2020, 08/23/2020, 08/25/2020   vaginal brachytherapy     Dr Gery Pray   Hypercholesteremia    Hyperlipidemia    Hypertension    per patient-on medication for preventative/has not diagnosed with HTN    Insomnia    Iron deficiency    Joint pain    Osteopenia    Reflux    SCCA (squamous cell carcinoma) of skin 05/15/2005   Left Chest (in situ)   SCCA (squamous cell carcinoma) of skin 10/24/2011   Left Side of Chest Med (in situ) (tx p bx)   SCCA (squamous cell carcinoma) of skin 01/28/2019   Right Forearm inf  (Keratoacanthoma) (tx p bx)   Superficial basal cell carcinoma (BCC) 06/25/1995   Upper Forehead at scar (curet and 5FU)   Ulcer    epigastric     Past Surgical History:  Procedure Laterality Date   ABDOMINAL HYSTERECTOMY     AUGMENTATION MAMMAPLASTY     BREAST ENHANCEMENT SURGERY     since removed in 2016   [place] EXTRACTION Bilateral 2021   CHOLECYSTECTOMY     PANCREATICODUODENECTOMY  06/27/2010   whipple    ROBOTIC ASSISTED TOTAL HYSTERECTOMY WITH BILATERAL SALPINGO OOPHERECTOMY Bilateral 10/02/2016   Procedure: XI ROBOTIC ASSISTED TOTAL HYSTERECTOMY WITH BILATERAL SALPINGO OOPHORECTOMY WITH SENTINAL LYMPH NODE BIOPSY LYSIS OF ADHESIONS, EXPLORATORY LAPAROTOMY;  Surgeon: Everitt Amber, MD;  Location: [facility];  Service: Gynecology;  Laterality: Bilateral;   TUBAL LIGATION     WHIPPLE PROCEDURE  2012    Family History  Problem Relation Age of Onset   Diabetes Mother    Dementia Mother    Hypertension Mother    Heart attack Father    Heart attack Brother    Cancer Brother        melanoma    Social History Social History   Tobacco Use   Smoking status: Never   Smokeless tobacco: Never  Vaping Use   Vaping Use: Never used  Substance Use Topics   Alcohol use: No   Drug use: No    Current Outpatient Medications  Medication Sig Dispense Refill   Apoaequorin (PREVAGEN PO) Take 1 tablet by mouth daily.     Ascorbic Acid (VITAMIN C) 1000 MG tablet Take 1,000 mg by mouth daily.     aspirin 81 MG chewable tablet Chew 1 tablet by mouth daily.     cholecalciferol (VITAMIN D) 1000 UNITS tablet Take 1,000 Units by mouth daily.      Coenzyme Q10 (COQ-10) 100 MG CAPS Take 100 mg by mouth daily.     empagliflozin (JARDIANCE) 10 MG TABS tablet Take 1 tablet by mouth daily.     lipase/protease/amylase 24000-76000 units CPEP Take 1 capsule (24,000 Units total) by mouth 3 (three) times daily before meals. (Patient taking differently:  Take 24,000 Units by mouth 2 (two) times daily with a meal.) 270 capsule 1   Melatonin 10 MG TABS Take 10 mg by mouth at bedtime as needed.     metFORMIN (GLUCOPHAGE-XR) 500 MG 24 hr tablet Take 2 tablets by  mouth at bedtime.     metoprolol tartrate (LOPRESSOR) 50 MG tablet Take 0.5 tablets by mouth 2 (two) times daily.     ONETOUCH VERIO test strip daily.     simvastatin (ZOCOR) 20 MG tablet Take 20 mg by mouth every evening.     sucralfate (CARAFATE) 1 g tablet Take 1 g by mouth 2 (two) times daily.     vitamin E 180 MG (400 UNITS) capsule Take 400 Units by mouth daily.     zinc gluconate 50 MG tablet Take 50 mg by mouth daily.     No current facility-administered medications for this visit.    Allergies  Allergen Reactions   Ibuprofen Hives    Review of Systems  Constitutional:  Negative for activity change, appetite change, fever and unexpected weight change.  HENT:  Negative for trouble swallowing and voice change.   Eyes:  Negative for visual disturbance.  Respiratory:  Negative for cough, shortness of breath and wheezing.   Cardiovascular:  Negative for chest pain and leg swelling.  Genitourinary:  Positive for frequency. Negative for difficulty urinating and dysuria.  Musculoskeletal:  Positive for arthralgias.  Skin:  Positive for rash.       Itching  Neurological:  Negative for seizures, syncope and weakness.  Hematological:  Negative for adenopathy. Bruises/bleeds easily.  All other systems reviewed and are negative.  BP (!) 121/53 (BP Location: Left Arm, Patient Position: Sitting)   Pulse (!) 43   Resp 20   Ht 5\' 5"  (1.651 m)   Wt 128 lb (58.1 kg)   SpO2 98% Comment: RA  BMI 21.30 kg/m  Physical Exam Vitals reviewed.  Constitutional:      General: She is not in acute distress.    Appearance: Normal appearance.  HENT:     Head: Normocephalic and atraumatic.  Eyes:     General: No scleral icterus.    Extraocular Movements: Extraocular movements intact.   Cardiovascular:     Rate and Rhythm: Normal rate and regular rhythm.     Pulses: Normal pulses.     Heart sounds: Normal heart sounds. No murmur heard. Pulmonary:     Effort: Pulmonary effort is normal. No respiratory distress.     Breath sounds: Normal breath sounds. No wheezing or rales.  Abdominal:     General: There is no distension.     Palpations: Abdomen is soft.  Musculoskeletal:     Cervical back: Neck supple.  Lymphadenopathy:     Cervical: No cervical adenopathy.  Skin:    General: Skin is warm and dry.  Neurological:     General: No focal deficit present.     Mental Status: She is alert and oriented to person, place, and time.     Cranial Nerves: No cranial nerve deficit.     Motor: No weakness.     Diagnostic Tests: NUCLEAR MEDICINE PET SKULL BASE TO THIGH   TECHNIQUE: 6.7 mCi F-18 FDG was injected intravenously. Full-ring PET imaging was performed from the skull base to thigh after the radiotracer. CT data was obtained and used for attenuation correction and anatomic localization.   Fasting blood glucose: 140 mg/dl   COMPARISON:  PET-CT dated 05/31/2020   FINDINGS: Mediastinal blood pool activity: SUV max 2.4   Liver activity: SUV max NA   NECK: No hypermetabolic cervical lymphadenopathy.   Incidental CT findings: none   CHEST: 9 x 12 mm posterior left upper lobe nodule (series 8/image 24), new, max SUV 5.0.  No hypermetabolic thoracic lymphadenopathy.   Incidental CT findings: Atherosclerotic calcifications of the aortic arch. Three vessel coronary atherosclerosis.   ABDOMEN/PELVIS: Status post hysterectomy. No residual hypermetabolism along the vagina/vaginal cuff.   No hypermetabolic abdominopelvic lymphadenopathy.   No abnormal hypermetabolism in the liver, spleen, pancreas, or adrenal glands.   Incidental CT findings: Hepatic steatosis. Atherosclerotic calcifications the abdominal aorta and branch vessels.   SKELETON: No focal  hypermetabolic activity to suggest skeletal metastasis.   Incidental CT findings: Degenerative changes of the visualized thoracolumbar spine.   IMPRESSION: Status post hysterectomy. No residual hypermetabolism along the vagina/vaginal cuff.   New 12 mm posterior left upper lobe pulmonary nodule/metastasis.     Electronically Signed   By: Julian Hy M.D.   On: 11/11/2020 09:01 CT CHEST WITH CONTRAST   TECHNIQUE: Multidetector CT imaging of the chest was performed during intravenous contrast administration.   CONTRAST:  34mL OMNIPAQUE IOHEXOL 350 MG/ML SOLN   COMPARISON:  PET-CT 11/10/2020.   FINDINGS: Cardiovascular: Heart size is normal. There is no significant pericardial fluid, thickening or pericardial calcification. There is aortic atherosclerosis, as well as atherosclerosis of the great vessels of the mediastinum and the coronary arteries, including calcified atherosclerotic plaque in the left main, left anterior descending, left circumflex and right coronary arteries.   Mediastinum/Nodes: No pathologically enlarged mediastinal or hilar lymph nodes. Esophagus is unremarkable in appearance. No axillary lymphadenopathy.   Lungs/Pleura: The nodule of concern on the prior PET-CT has increased in size, currently measuring 1.7 x 1.2 x 1.2 cm (axial image 57 of series 5 and sagittal image 133 of series 7) as compared with 9 x 12 mm on the prior PET-CT. This nodule has macrolobulated and slightly spiculated margins, with extensions to the pleura laterally. No other new suspicious appearing pulmonary nodules or masses are noted. No acute consolidative airspace disease. No pleural effusions. Some areas of mild fibrosis are noted in the medial aspect of the right lower lobe adjacent to several prominent marginal osteophytes.   Upper Abdomen: Aortic atherosclerosis. In the anterior aspect of segment 2 of the liver (axial image 134 of series 2) there is  a well-defined 1.7 x 1.0 cm low-attenuation lesion, compatible with a simple cyst.   Musculoskeletal: There are no aggressive appearing lytic or blastic lesions noted in the visualized portions of the skeleton.   IMPRESSION: 1. Interval enlargement of aggressive appearing left upper lobe pulmonary nodule which currently measures 1.7 x 1.2 x 1.2 cm. This nodule was previously hypermetabolic on PET-CT. This may represent a solitary metastatic lesion, however, the possibility of a primary bronchogenic neoplasm also warrants consideration, particularly in light of the morphology of the nodule. No new pulmonary nodules. No lymphadenopathy noted in the thorax. 2. Aortic atherosclerosis, in addition to left main and 3 vessel coronary artery disease. Assessment for potential risk factor modification, dietary therapy or pharmacologic therapy may be warranted, if clinically indicated.   Aortic Atherosclerosis (ICD10-I70.0).     Electronically Signed   By: Vinnie Langton M.D.   On: 01/28/2021 08:17 I personally reviewed the CT and PET/CT images.  There is a solitary left upper lobe lung nodule that is hypermetabolic on PET/CT.  Showed interval growth between the PET and CT images.  Impression: Asuka Dusseau is a 80 year old woman with a history of endometrial cancer, adenocarcinoma the vaginal cuff, DVT after surgery, malignant gastrointestinal stromal tumor, Whipple procedure, type 2 diabetes, multiple basal cell carcinomas, coronary atherosclerosis, hypertension, hyperlipidemia, iron deficiency anemia, arthritis, and reflux.  Left  upper lobe lung nodule-new nodule as of June.  Lifelong non-smoker.  Most likely metastatic endometrial cancer.  Infectious and inflammatory nodules and primary bronchogenic carcinoma are also in the differential.  This would be a highly unusual presentation for lung primary and a non-smoker.  That does not completely rule out the possibility but I think it is  less likely than metastatic disease.  I do think this needs to be considered metastatic endometrial cancer unless we can prove otherwise.  We discussed the possibility of attempted bronchoscopic biopsy versus surgical resection.  Given the high probability of malignancy, I think proceeding directly to resection is the best option.  I recommended we do robotic left VATS for wedge resection and possible lobectomy.  We would only do lobectomy if radiology was certain this was a lung primary.  More than likely would do the limited resection and node sampling.  I informed her of the general nature of the procedure including the need for general anesthesia, the incisions to be used, the use of drainage tube postoperatively, the expected hospital stay, and the overall recovery.  I informed her of the indications, risks, benefits, and alternatives.  She understands the risks include, but not limited to death, MI, DVT, PE, bleeding, possible need for transfusion, infection, prolonged air leak, cardiac arrhythmias, as well as possibility of other unforeseeable complications.  Given her history of a DVT after hysterectomy she will definitely need both mechanical and pharmacologic prophylaxis.  We will also consider sending her home on anticoagulation for a brief period postoperatively.  Plan: Pulmonary function testing with and without bronchodilators Robotic left VATS for wedge resection possible left upper lobectomy.  Patient will call to schedule.  [name], MD [facility] [phone]

## 2021-01-31 NOTE — Progress Notes (Unsigned)
pft  

## 2021-02-01 ENCOUNTER — Telehealth: Payer: Self-pay | Admitting: *Deleted

## 2021-02-01 NOTE — Telephone Encounter (Signed)
Patient called and left a message stating "I would like a call from Dr Denman George; I would like to discuss the appt I had with Dr Roxan Hockey and the lung nodule."

## 2021-02-01 NOTE — Telephone Encounter (Signed)
Per Lenna Sciara APP scheduled the patient for a phone visit with Dr Denman George for tomorrow

## 2021-02-02 ENCOUNTER — Inpatient Hospital Stay (HOSPITAL_BASED_OUTPATIENT_CLINIC_OR_DEPARTMENT_OTHER): Payer: PPO | Admitting: Gynecologic Oncology

## 2021-02-02 ENCOUNTER — Encounter: Payer: Self-pay | Admitting: *Deleted

## 2021-02-02 ENCOUNTER — Other Ambulatory Visit: Payer: Self-pay | Admitting: *Deleted

## 2021-02-02 ENCOUNTER — Encounter: Payer: Self-pay | Admitting: Gynecologic Oncology

## 2021-02-02 ENCOUNTER — Telehealth: Payer: Self-pay | Admitting: *Deleted

## 2021-02-02 ENCOUNTER — Ambulatory Visit
Admission: RE | Admit: 2021-02-02 | Discharge: 2021-02-02 | Disposition: A | Discharge status: Home / Self Care | Payer: PPO | Source: Ambulatory Visit | Attending: Radiation Oncology | Admitting: Radiation Oncology

## 2021-02-02 DIAGNOSIS — C541 Malignant neoplasm of endometrium: Secondary | ICD-10-CM | POA: Insufficient documentation

## 2021-02-02 DIAGNOSIS — I700 Atherosclerosis of aorta: Secondary | ICD-10-CM | POA: Insufficient documentation

## 2021-02-02 DIAGNOSIS — R32 Unspecified urinary incontinence: Secondary | ICD-10-CM | POA: Insufficient documentation

## 2021-02-02 DIAGNOSIS — Z923 Personal history of irradiation: Secondary | ICD-10-CM | POA: Insufficient documentation

## 2021-02-02 DIAGNOSIS — Z79899 Other long term (current) drug therapy: Secondary | ICD-10-CM | POA: Insufficient documentation

## 2021-02-02 DIAGNOSIS — R911 Solitary pulmonary nodule: Secondary | ICD-10-CM

## 2021-02-02 DIAGNOSIS — R918 Other nonspecific abnormal finding of lung field: Secondary | ICD-10-CM | POA: Insufficient documentation

## 2021-02-02 DIAGNOSIS — I2510 Atherosclerotic heart disease of native coronary artery without angina pectoris: Secondary | ICD-10-CM | POA: Insufficient documentation

## 2021-02-02 DIAGNOSIS — Z7982 Long term (current) use of aspirin: Secondary | ICD-10-CM | POA: Insufficient documentation

## 2021-02-02 NOTE — Progress Notes (Signed)
Gynecologic Oncology Follow-up Note  Patient's location: Home Provider's location: [place]  Chief Complaint:  Chief Complaint  Patient presents with   recurrent endometrial cancer     Assessment/Plan:  [name]  is a 80 y.o.  year old with history of recurrent endometrioid endometrial cancer at the vaginal cuff and posterior vaginal wall (MMR deficient, ER/PR positive) diagnosed in December, 2021 after initial staging surgery/diagnosis in May, 2018. S/p salvage RT to pelvis and vagina. New radiographic finding on PET (June, 2022) concerning for left upper lobe lung metastasis.  I explained to the patient that I agreed with Dr Hendrickson's plan. I explained that resection would give definitive answers regarding diagnosis and next steps. If this lesion is metastatic, would recommend systemic treatment (eg hormonal therapy) vs consideration for close follow-up if it is completely resected on as part of VATS. I would favor systemic anti-estrogen therapy after surgery given the high probability of recurrence, and favorable toxicity profile of this treatment.   PET scheduled for next week. If repeat PET imaging shows multifocal disease, would not recommend surgical intervention, but rather, proceed with systemic therapy (carb/tax, followed by pembro for persistent or subsequent recurrences given MMRd disease).   HPI: [name] is a 80 year old woman with a history of stage IA grade 1 endometrial cancer in 2018 who was seen in consultation at the request of Dr Delora Fuel for evaluation of a vaginal mass.  The patient underwent surgical staging for her endometrial cancer in May 2018.  This included a robotic total hysterectomy with BSO and sentinel lymph node biopsy.  Her surgery was complicated by extensive adhesions from her prior Whipple procedure.  Final pathology revealed a grade 1 endometrioid endometrial adenocarcinoma that measured 4 cm.  There was  inner half myometrial invasion and negative sentinel lymph nodes with no lymphovascular space invasion.  She was characterizes having low risk endometrial cancer and therefore no adjuvant therapy was recommended in accordance with NCCN guidelines. MMR testing was not performed at that time, but later requested revealing loss of expression of MLH1 and PMS2 (MSI high), and ER /PR strongly positive.  She received subsequent follow-up with her benign gynecologist and was lost to follow-up with [place].  In early December 2021 she began experiencing postmenopausal bleeding.  She reported this to her primary care doctor who attempted speculum exam which was unsuccessful due to discomfort.  The patient was then seen by Dr. Delora Fuel who performed a limited speculum exam due to patient discomfort and recognized a vaginal mass.  She subsequently referred the patient to see GYN oncology.  The patient was then seen by me on 05/13/21 and on vaginal examination the posterior left vaginal wall/apex was replaced by tumor. Biopsy was performed which confirmed recurrent endometrioid endometrial cancer.   She received restaging with PET scan on 06/18/20 which revealed hypermetabolism noted in the vaginal cuff and upper 1/3 of the vagina consistent with known neoplasm. No locoregional lymphadenopathy or distant metastatic disease was seen. This suggested a localized pelvic recurrence and treatment with salvage radiation was recommended.  She received salvage radiation between 06/21/2020 through 08/25/2020.  This included external beam radiation with IMRT to the vagina and pelvis, total dose 45 Gy.  Additionally received she received vaginal and pelvic boost with HDR brachytherapy total dose of 24 Gy.  After being seeing for post-treatment surveillance in June, 2022, she was scheduled to have a post-treatment surveillance/evaluation PET scan on 11/11/20. This revealed complete resolution at  the vaginal/pelvic recurrence site, however  there was a new 12 mm posterior left upper lobe pulmonary nodule/metastasis seen.  The patient then immediately travelled to [place] to visit family for 3 months. Her case was presented at the thoracic multidisciplinary conference and the recommendation was made for referral/consultation with Dr Roxan Hockey from Thoracic Surgery. She elected to stay on on [place] until September, when she would return for repeat imaging and consultation with Thoracic Surgery.   Interval Hx:  On 01/28/21 she received a CT chest to monitor the pulmonary lesion and this showed an interval enlargement of the aggressive appearing left upper lobe pulmonary nodule which measured 1.7 x 1.2 x 1.2 cm. It was felt to likely represent a solitary metastatic lesion, however, the possibility of a primary bronchogenic neoplasm also warranted consideration, particularly in light of the morphology of the nodule. No new pulmonary nodules and no lymphadenopathy were noted in the thorax.  She was seen by Dr Roxan Hockey who recommended a VATS procedure.    Current Meds:  Outpatient Encounter Medications as of 02/02/2021  Medication Sig   Apoaequorin (PREVAGEN PO) Take 1 tablet by mouth daily.   Ascorbic Acid (VITAMIN C) 1000 MG tablet Take 1,000 mg by mouth daily.   aspirin 81 MG chewable tablet Chew 1 tablet by mouth daily.   cholecalciferol (VITAMIN D) 1000 UNITS tablet Take 1,000 Units by mouth daily.    Coenzyme Q10 (COQ-10) 100 MG CAPS Take 100 mg by mouth daily.   empagliflozin (JARDIANCE) 10 MG TABS tablet Take 1 tablet by mouth daily.   lipase/protease/amylase 24000-76000 units CPEP Take 1 capsule (24,000 Units total) by mouth 3 (three) times daily before meals. (Patient taking differently: Take 24,000 Units by mouth 2 (two) times daily with a meal.)   Melatonin 10 MG TABS Take 10 mg by mouth at bedtime as needed.   metFORMIN (GLUCOPHAGE-XR) 500 MG 24 hr tablet Take 2 tablets by mouth at bedtime.   metoprolol tartrate  (LOPRESSOR) 50 MG tablet Take 0.5 tablets by mouth 2 (two) times daily.   ONETOUCH VERIO test strip daily.   simvastatin (ZOCOR) 20 MG tablet Take 20 mg by mouth every evening.   sucralfate (CARAFATE) 1 g tablet Take 1 g by mouth 2 (two) times daily.   vitamin E 180 MG (400 UNITS) capsule Take 400 Units by mouth daily.   zinc gluconate 50 MG tablet Take 50 mg by mouth daily.   No facility-administered encounter medications on file as of 02/02/2021.    Allergy:  Allergies  Allergen Reactions   Ibuprofen Hives    Social Hx:   Social History   Socioeconomic History   Marital status: Widowed    Spouse name: Not on file   Number of children: 1   Years of education: Not on file   Highest education level: Not on file  Occupational History   Occupation: accounting    Comment: retired  Tobacco Use   Smoking status: Never   Smokeless tobacco: Never  Vaping Use   Vaping Use: Never used  Substance and Sexual Activity   Alcohol use: No   Drug use: No   Sexual activity: Not Currently    Birth control/protection: Surgical  Other Topics Concern   Not on file  Social History Narrative   ** Merged History Encounter **       Social Determinants of Health   Financial Resource Strain: Not on file  Food Insecurity: Not on file  Transportation Needs: Not on file  Physical Activity: Not on file  Stress: Not on file  Social Connections: Not on file  Intimate Partner Violence: Not on file    Past Surgical Hx:  Past Surgical History:  Procedure Laterality Date   ABDOMINAL HYSTERECTOMY     AUGMENTATION MAMMAPLASTY     BREAST ENHANCEMENT SURGERY     since removed in 2016   [place] EXTRACTION Bilateral 2021   CHOLECYSTECTOMY     PANCREATICODUODENECTOMY  06/27/2010   whipple    ROBOTIC ASSISTED TOTAL HYSTERECTOMY WITH BILATERAL SALPINGO OOPHERECTOMY Bilateral 10/02/2016   Procedure: XI ROBOTIC ASSISTED TOTAL HYSTERECTOMY WITH BILATERAL  SALPINGO OOPHORECTOMY WITH SENTINAL LYMPH NODE BIOPSY LYSIS OF ADHESIONS, EXPLORATORY LAPAROTOMY;  Surgeon: Everitt Amber, MD;  Location: [facility];  Service: Gynecology;  Laterality: Bilateral;   TUBAL LIGATION     WHIPPLE PROCEDURE  2012    Past Medical Hx:  Past Medical History:  Diagnosis Date   Anemia    Atypical mole 05/15/2005   Left Mid Outer Back (slight to moderate)   BCC (basal cell carcinoma of skin) 11/17/1991   Right Cheek (excision)   BCC (basal cell carcinoma of skin) 05/30/1994   Left Upper Forehead (curet and 5FU)   BCC (basal cell carcinoma of skin) 05/04/1997   Right upper Forehead/hairline (curet and excision)   BCC (basal cell carcinoma of skin) 06/25/1997   Right Upper Forehead/hairline (+margin) (MOH's)   BCC (basal cell carcinoma of skin) 11/12/2006   Left Cheek (curet and excision)   BCC (basal cell carcinoma of skin) 08/15/2009   Right Lower Leg (curet 5FU)   BCE (basal cell epithelioma), leg, right    Cataract 2021   Colon polyps    Coronary artery disease 2008   Coronary atherosclerosis    Diabetes mellitus without complication ([place])    pre-diabetic   DVT of lower extremity (deep venous thrombosis) ([place]) March 2012   endometrioid endometrial    GI bleed 2009   GIST (gastrointestinal stromal tumor), malignant ([place])    T2N0   H pylori ulcer 2001   History of radiation therapy 06/21/20-07/25/20   endometrium, IMRT     Dr. Sondra Come   History of radiation therapy 08/17/2020, 08/23/2020, 08/25/2020   vaginal brachytherapy     Dr Gery Pray   Hypercholesteremia    Hyperlipidemia    Hypertension    per patient-on medication for preventative/has not diagnosed with HTN    Insomnia    Iron deficiency    Joint pain    Osteopenia    Reflux    SCCA (squamous cell carcinoma) of skin 05/15/2005   Left Chest (in situ)   SCCA (squamous cell carcinoma) of skin 10/24/2011   Left Side of Chest Med (in situ) (tx p bx)   SCCA (squamous cell carcinoma) of skin  01/28/2019   Right Forearm inf (Keratoacanthoma) (tx p bx)   Superficial basal cell carcinoma (BCC) 06/25/1995   Upper Forehead at scar (curet and 5FU)   Ulcer    epigastric     Past Gynecological History:  See HPI No LMP recorded. Patient is postmenopausal.  Family Hx:  Family History  Problem Relation Age of Onset   Diabetes Mother    Dementia Mother    Hypertension Mother    Heart attack Father    Heart attack Brother    Cancer Brother        melanoma    Review of Systems:  Constitutional  Feels well,    ENT Normal appearing ears and nares bilaterally Skin/Breast  No rash, sores, jaundice, itching, dryness Cardiovascular  No chest pain, shortness of breath, or edema  Pulmonary  No cough or wheeze.  Gastro Intestinal  No nausea, vomitting, or diarrhoea. No bright red blood per rectum, no abdominal pain, change in bowel movement, or constipation.  Genito Urinary  No frequency, urgency, dysuria, no bleeding  Musculo Skeletal  No myalgia, arthralgia, joint swelling or pain  Neurologic  No weakness, numbness, change in gait,  Psychology  No depression, anxiety, insomnia.   Vitals:  There were no vitals taken for this visit.  Physical Exam: Deferred due to phone visit.  I discussed the assessment and treatment plan with the patient. The patient was provided with an opportunity to ask questions and all were answered. The patient agreed with the plan and demonstrated an understanding of the instructions.   The patient was advised to call back or see an in-person evaluation if the symptoms worsen or if the condition fails to improve as anticipated.   I provided 20 minutes of non face-to-face telephone visit time during this encounter, and > 50% was spent counseling as documented under my assessment & plan.    Thereasa Solo, MD  02/02/2021, 4:32 PM

## 2021-02-02 NOTE — Telephone Encounter (Signed)
RETURNED PATIENT'S PHONE CALL, LVM TO RESCHEDULE

## 2021-02-03 ENCOUNTER — Telehealth: Payer: Self-pay | Admitting: *Deleted

## 2021-02-03 NOTE — Telephone Encounter (Signed)
RETURNED PATIENT'S PHONE CALL, LVM FOR A RETURN CALL 

## 2021-02-10 ENCOUNTER — Other Ambulatory Visit: Payer: Self-pay

## 2021-02-10 ENCOUNTER — Encounter (HOSPITAL_COMMUNITY)
Admission: RE | Admit: 2021-02-10 | Discharge: 2021-02-10 | Disposition: A | Discharge status: Home / Self Care | Payer: PPO | Source: Ambulatory Visit | Attending: Gynecologic Oncology | Admitting: Gynecologic Oncology

## 2021-02-10 DIAGNOSIS — C7982 Secondary malignant neoplasm of genital organs: Secondary | ICD-10-CM | POA: Diagnosis not present

## 2021-02-10 DIAGNOSIS — C541 Malignant neoplasm of endometrium: Secondary | ICD-10-CM | POA: Insufficient documentation

## 2021-02-10 DIAGNOSIS — C55 Malignant neoplasm of uterus, part unspecified: Secondary | ICD-10-CM | POA: Diagnosis not present

## 2021-02-10 DIAGNOSIS — R911 Solitary pulmonary nodule: Secondary | ICD-10-CM | POA: Diagnosis not present

## 2021-02-10 DIAGNOSIS — Z90710 Acquired absence of both cervix and uterus: Secondary | ICD-10-CM | POA: Diagnosis not present

## 2021-02-10 LAB — GLUCOSE, CAPILLARY: Glucose-Capillary: 126 mg/dL — ABNORMAL HIGH (ref 70–99)

## 2021-02-10 MED ORDER — FLUDEOXYGLUCOSE F - 18 (FDG) INJECTION
6.5000 | Freq: Once | INTRAVENOUS | Status: AC
  Administered 2021-02-10: 6.37 via INTRAVENOUS

## 2021-02-11 NOTE — Progress Notes (Signed)
Radiation Oncology         [phone] ________________________________  Name: [name] MRN: [id number]  Date: 02/13/2021  DOB: 11-01-1940  Follow-Up Visit Note  CC: [name], MD  [name],*    ICD-10-CM   1. Endometrial adenocarcinoma ([place])  C54.1 Ambulatory referral to Physical Therapy      Diagnosis: Recurrent stage IA, grade 1 endometrial cancer; with new malignant left upper lobe lung nodule   Interval Since Last Radiation: 5 months and 8 days    Radiation Treatment Dates: 06/21/2020 through 08/25/2020   Site: Vagina: pelvis Technique: IMRT Total Dose (Gy): 45/45 Dose per Fx (Gy): 1.8 Completed Fx: 25/25 Beam Energies: 6X   Site: Vagina: pelvis boost Technique: HDR-brachytherapy Total Dose (Gy): 24/24 Dose per Fx (Gy): 6 Completed Fx: 4/4 Beam Energies: Ir-192   Narrative:  The patient returns today for routine follow-up, she was last seen follow-up on 09/29/20.   Pertinent imaging since the patient was last seen includes: PET scan on 11/10/20 demonstrating no residual hypermetabolism along the vagina/vaginal cuff. However, PET did demonstrate a new 12 mm posterior left upper lobe pulmonary nodule/ metastasis.   Chest CT performed on 01/26/21 further demonstrated the interval enlargement of the aggressive appearing left upper lobe pulmonary nodule; measuring 1.7 x 1.2 x 1.2 cm. Nodule was noted to possible represent a solitary metastatic lesion, however, the possibility of  primary bronchogenic neoplasm could not be ruled out.          Accordingly, the patient met with Dr. Denman George on 11/22/20 to discuss these new findings. Dr. Denman George recommended for surgical resectioning of the new lesion and systemic treatment.   The patient then met with Dr. Roxan Hockey on 01/31/21 for surgical consult; during which time he recommended for the patient to proceed directly with resectioning due to high probability of malignancy. She is scheduled  for robotic assisted thorascopy with left upper lobe wedge resection on 03/09/21 under the care of Dr. Roxan Hockey.   Recommendations were for surgery earlier however the patient had a prior beach trip planned with her family which she did not wish to cancel.  Restaging PET performed on 02/10/21 again demonstrated interval enlargement and increase in metabolic activity of the solitary left upper lobe pulmonary nodule consistent with malignancy. No evidence of metastatic adenopathy or local recurrence was visualized in the mediastinum, abdomen, or pelvis.   Patient reports some urinary incontinence since her radiation therapy.  She denies any hematuria or dysuria.  She denies any rectal bleeding or frequent problems with diarrhea.  She continues to complain of some generalized fatigue.  She denies any pain within the chest significant cough or hemoptysis.  Allergies:  is allergic to ibuprofen.  Meds: Current Outpatient Medications  Medication Sig Dispense Refill   Apoaequorin (PREVAGEN PO) Take 1 tablet by mouth daily.     Ascorbic Acid (VITAMIN C) 1000 MG tablet Take 1,000 mg by mouth daily.     aspirin 81 MG chewable tablet Chew 1 tablet by mouth daily.     cholecalciferol (VITAMIN D) 1000 UNITS tablet Take 1,000 Units by mouth daily.      Coenzyme Q10 (COQ-10) 100 MG CAPS Take 100 mg by mouth daily.     empagliflozin (JARDIANCE) 10 MG TABS tablet Take 1 tablet by mouth daily.     lipase/protease/amylase 24000-76000 units CPEP Take 1 capsule (24,000 Units total) by mouth 3 (three) times daily before meals. (Patient taking differently: Take 24,000 Units by mouth 2 (two) times daily  with a meal.) 270 capsule 1   Melatonin 10 MG TABS Take 10 mg by mouth at bedtime as needed.     metoprolol tartrate (LOPRESSOR) 50 MG tablet Take 0.5 tablets by mouth 2 (two) times daily.     ONETOUCH VERIO test strip daily.     simvastatin (ZOCOR) 20 MG tablet Take 20 mg by mouth every evening.     sucralfate  (CARAFATE) 1 g tablet Take 1 g by mouth 2 (two) times daily.     vitamin E 180 MG (400 UNITS) capsule Take 400 Units by mouth daily.     zinc gluconate 50 MG tablet Take 50 mg by mouth daily.     metFORMIN (GLUCOPHAGE-XR) 500 MG 24 hr tablet Take 2 tablets by mouth at bedtime. (Patient not taking: Reported on 02/13/2021)     No current facility-administered medications for this encounter.    Physical Findings: The patient is in no acute distress. Patient is alert and oriented.  weight is 130 lb 9.6 oz (59.2 kg). Her temperature is 97 F (36.1 C) (abnormal). Her blood pressure is 140/56 (abnormal) and her pulse is 45 (abnormal). Her respiration is 18 and oxygen saturation is 100%. .  No significant changes. Lungs are clear to auscultation bilaterally. Heart has regular rate and rhythm. No palpable cervical, supraclavicular, or axillary adenopathy. Abdomen soft, non-tender, normal bowel sounds.  On pelvic examination the external genitalia unremarkable.  A speculum exam is performed.  Exam was uncomfortable for the patient.  There were no mucosal lesions noted on speculum exam.  A pediatric speculum provided the best view.  On bimanual examination no pelvic masses appreciated.   Lab Findings: Lab Results  Component Value Date   WBC 5.4 10/24/2016   HGB 11.5 (L) 10/24/2016   HCT 36.1 10/24/2016   MCV 94.0 10/24/2016   PLT 240 10/24/2016    Radiographic Findings: CT Chest W Contrast  Result Date: 01/28/2021 CLINICAL DATA:  80 year old female with history of endometrial cancer with pulmonary nodule noted on prior study. Followup study. EXAM: CT CHEST WITH CONTRAST TECHNIQUE: Multidetector CT imaging of the chest was performed during intravenous contrast administration. CONTRAST:  64m OMNIPAQUE IOHEXOL 350 MG/ML SOLN COMPARISON:  PET-CT 11/10/2020. FINDINGS: Cardiovascular: Heart size is normal. There is no significant pericardial fluid, thickening or pericardial calcification. There is aortic  atherosclerosis, as well as atherosclerosis of the great vessels of the mediastinum and the coronary arteries, including calcified atherosclerotic plaque in the left main, left anterior descending, left circumflex and right coronary arteries. Mediastinum/Nodes: No pathologically enlarged mediastinal or hilar lymph nodes. Esophagus is unremarkable in appearance. No axillary lymphadenopathy. Lungs/Pleura: The nodule of concern on the prior PET-CT has increased in size, currently measuring 1.7 x 1.2 x 1.2 cm (axial image 57 of series 5 and sagittal image 133 of series 7) as compared with 9 x 12 mm on the prior PET-CT. This nodule has macrolobulated and slightly spiculated margins, with extensions to the pleura laterally. No other new suspicious appearing pulmonary nodules or masses are noted. No acute consolidative airspace disease. No pleural effusions. Some areas of mild fibrosis are noted in the medial aspect of the right lower lobe adjacent to several prominent marginal osteophytes. Upper Abdomen: Aortic atherosclerosis. In the anterior aspect of segment 2 of the liver (axial image 134 of series 2) there is a well-defined 1.7 x 1.0 cm low-attenuation lesion, compatible with a simple cyst. Musculoskeletal: There are no aggressive appearing lytic or blastic lesions noted in the visualized  portions of the skeleton. IMPRESSION: 1. Interval enlargement of aggressive appearing left upper lobe pulmonary nodule which currently measures 1.7 x 1.2 x 1.2 cm. This nodule was previously hypermetabolic on PET-CT. This may represent a solitary metastatic lesion, however, the possibility of a primary bronchogenic neoplasm also warrants consideration, particularly in light of the morphology of the nodule. No new pulmonary nodules. No lymphadenopathy noted in the thorax. 2. Aortic atherosclerosis, in addition to left main and 3 vessel coronary artery disease. Assessment for potential risk factor modification, dietary therapy or  pharmacologic therapy may be warranted, if clinically indicated. Aortic Atherosclerosis (ICD10-I70.0). Electronically Signed   By: Vinnie Langton M.D.   On: 01/28/2021 08:17   NM PET Image Restage (PS) Skull Base to Thigh  Result Date: 02/12/2021 CLINICAL DATA:  Subsequent treatment strategy for uterine carcinoma. Recurrence endometrial cancer. LEFT upper lobe hypermetabolic pulmonary nodule on comparison PET-CT scan. EXAM: NUCLEAR MEDICINE PET SKULL BASE TO THIGH TECHNIQUE: 6.4 mCi F-18 FDG was injected intravenously. Full-ring PET imaging was performed from the skull base to thigh after the radiotracer. CT data was obtained and used for attenuation correction and anatomic localization. Fasting blood glucose: 126 mg/dl COMPARISON:  None. FINDINGS: Mediastinal blood pool activity: SUV max 2.6 Liver activity: SUV max NA NECK: No hypermetabolic lymph nodes in the neck. Incidental CT findings: none CHEST: Persistent hypermetabolic activity associated with the LEFT upper lobe nodule of concern with SUV max equal 7.3 compared SUV max equal 5.0. Nodule measures 11 mm x 14 mm (image 22/CT series 8) compared with 11 mm x 9 mm on comparison PET-CT scan. No additional hypermetabolic pulmonary nodules. No hypermetabolic mediastinal adenopathy. Incidental CT findings: none ABDOMEN/PELVIS: No hypermetabolic activity within the pelvis. No hypermetabolic abdominal pelvic lymph nodes. No abnormal metabolic activity in liver. Incidental CT findings: Post hysterectomy. SKELETON: No focal hypermetabolic activity to suggest skeletal metastasis. Incidental CT findings: none IMPRESSION: 1. Interval enlargement and increase in metabolic activity solitary LEFT upper lobe pulmonary nodule consistent with malignancy. 2. No evidence of metastatic adenopathy in the mediastinum. 3. No evidence of local recurrence in the pelvis or metastatic adenopathy in the abdomen pelvis Electronically Signed   By: Suzy Bouchard M.D.   On: 02/12/2021  13:50    Impression:  Recurrent stage IA, grade 1 endometrial cancer; with new, likely metastatic lung nodule   The patient is recovering from the effects of radiation.  She continues to have soreness in the vaginal area but no vaginal bleeding or hematuria.  She has urinary incontinence as above.  Discussed potential referral to physical therapy for this issue and she is interested in pursuing this consultation.  Plan: Patient will follow-up with Dr. Berline Lopes in 3 months.  Radiation oncology in 6 months.  As above she will undergo surgery next month for her solitary lung lesion presumably metastatic disease versus new lung primary.   25 minutes of total time was spent for this patient encounter, including preparation, face-to-face counseling with the patient and coordination of care, physical exam, and documentation of the encounter. ____________________________________  Blair Promise, PhD, MD   This document serves as a record of services personally performed by Gery Pray, MD. It was created on his behalf by Roney Mans, a trained medical scribe. The creation of this record is based on the scribe's personal observations and the provider's statements to them. This document has been checked and approved by the attending provider.

## 2021-02-13 ENCOUNTER — Encounter: Payer: Self-pay | Admitting: Radiation Oncology

## 2021-02-13 ENCOUNTER — Ambulatory Visit
Admission: RE | Admit: 2021-02-13 | Discharge: 2021-02-13 | Disposition: A | Discharge status: Home / Self Care | Payer: PPO | Source: Ambulatory Visit | Attending: Radiation Oncology | Admitting: Radiation Oncology

## 2021-02-13 ENCOUNTER — Other Ambulatory Visit: Payer: Self-pay

## 2021-02-13 DIAGNOSIS — R918 Other nonspecific abnormal finding of lung field: Secondary | ICD-10-CM | POA: Diagnosis not present

## 2021-02-13 DIAGNOSIS — Z923 Personal history of irradiation: Secondary | ICD-10-CM | POA: Diagnosis not present

## 2021-02-13 DIAGNOSIS — R32 Unspecified urinary incontinence: Secondary | ICD-10-CM | POA: Diagnosis not present

## 2021-02-13 DIAGNOSIS — I700 Atherosclerosis of aorta: Secondary | ICD-10-CM | POA: Diagnosis not present

## 2021-02-13 DIAGNOSIS — C541 Malignant neoplasm of endometrium: Secondary | ICD-10-CM | POA: Diagnosis not present

## 2021-02-13 DIAGNOSIS — Z79899 Other long term (current) drug therapy: Secondary | ICD-10-CM | POA: Diagnosis not present

## 2021-02-13 DIAGNOSIS — C7802 Secondary malignant neoplasm of left lung: Secondary | ICD-10-CM | POA: Diagnosis not present

## 2021-02-13 DIAGNOSIS — I2510 Atherosclerotic heart disease of native coronary artery without angina pectoris: Secondary | ICD-10-CM | POA: Diagnosis not present

## 2021-02-13 DIAGNOSIS — Z7982 Long term (current) use of aspirin: Secondary | ICD-10-CM | POA: Diagnosis not present

## 2021-02-13 HISTORY — DX: Malignant neoplasm of unspecified part of unspecified bronchus or lung: C34.90

## 2021-02-13 NOTE — Progress Notes (Signed)
[name] is here today for follow up post radiation to the pelvic.  They completed their radiation on: 08/25/20  Does the patient complain of any of the following:  Pain: Patient denies pain.  Abdominal bloating: no Diarrhea/Constipation: diarrhea this morning.  Nausea/Vomiting: no Vaginal Discharge: no Blood in Urine or Stool: no Urinary Issues (dysuria/incomplete emptying/ incontinence/ increased frequency/urgency): urinary incontinence.  Does patient report using vaginal dilator 2-3 times a week and/or sexually active 2-3 weeks: Patient reports using at times.  Post radiation skin changes: Reports feeling raw in pelvic area.   Additional comments if applicable:   Vitals:   02/13/21 1020  BP: (!) 140/56  Pulse: (!) 45  Resp: 18  Temp: (!) 97 F (36.1 C)  SpO2: 100%  Weight: 130 lb 9.6 oz (59.2 kg)

## 2021-02-14 ENCOUNTER — Telehealth: Payer: Self-pay

## 2021-02-14 NOTE — Telephone Encounter (Signed)
Left message requesting return call

## 2021-02-15 NOTE — Telephone Encounter (Signed)
Spoke with [name] regarding her PET scan on 02/10/21. Area in left lung lighting up on PET. No other signs of recurrence or metastatic disease. Patient states that Dr. Sondra Come reviewed results with her at her recent appointment and verbalizes understanding. Instructed to call with any questions or concerns.

## 2021-02-20 ENCOUNTER — Ambulatory Visit: Payer: PPO | Admitting: Dermatology

## 2021-03-06 NOTE — Progress Notes (Signed)
Surgical Instructions    Your procedure is scheduled on Thursday October 13th.  Report to [facility] Main Entrance "A" at 6 A.M., then check in with the Admitting office.  Call this number if you have problems the morning of surgery:  [phone]   If you have any questions prior to your surgery date call [phone]: Open Monday-Friday 8am-4pm    Remember:  Do not eat or drink after midnight the night before your surgery   Take these medicines the morning of surgery with A SIP OF WATER Apoaequorin (PREVAGEN PO) metoprolol tartrate (LOPRESSOR) 50 MG tablet simvastatin (ZOCOR) 20 MG tablet sucralfate (CARAFATE) 1 g tablet   Follow your surgeon's instructions on when to stop Aspirin.  If no instructions were given by your surgeon then you will need to call the office to get those instructions.    As of today, STOP taking any Aspirin (unless otherwise instructed by your surgeon) Aleve, Naproxen, Ibuprofen, Motrin, Advil, Goody's, BC's, all herbal medications, fish oil, and all vitamins.  WHAT DO I DO ABOUT MY DIABETES MEDICATION?   Do not take oral diabetes medicines (Jardiance) the morning of surgery.  DO NOT take your Jardiance the day before surgery  HOW TO MANAGE YOUR DIABETES BEFORE AND AFTER SURGERY  Why is it important to control my blood sugar before and after surgery? Improving blood sugar levels before and after surgery helps healing and can limit problems. A way of improving blood sugar control is eating a healthy diet by:  Eating less sugar and carbohydrates  Increasing activity/exercise  Talking with your doctor about reaching your blood sugar goals High blood sugars (greater than 180 mg/dL) can raise your risk of infections and slow your recovery, so you will need to focus on controlling your diabetes during the weeks before surgery. Make sure that the doctor who takes care of your diabetes knows about your planned surgery including the date and location.  How  do I manage my blood sugar before surgery? Check your blood sugar at least 4 times a day, starting 2 days before surgery, to make sure that the level is not too high or low.  Check your blood sugar the morning of your surgery when you wake up and every 2 hours until you get to the Short Stay unit.  If your blood sugar is less than 70 mg/dL, you will need to treat for low blood sugar: Do not take insulin. Treat a low blood sugar (less than 70 mg/dL) with  cup of clear juice (cranberry or apple), 4 glucose tablets, OR glucose gel. Recheck blood sugar in 15 minutes after treatment (to make sure it is greater than 70 mg/dL). If your blood sugar is not greater than 70 mg/dL on recheck, call [phone] for further instructions. Report your blood sugar to the short stay nurse when you get to Short Stay.  If you are admitted to the hospital after surgery: Your blood sugar will be checked by the staff and you will probably be given insulin after surgery (instead of oral diabetes medicines) to make sure you have good blood sugar levels. The goal for blood sugar control after surgery is 80-180 mg/dL.    After your COVID test   You are not required to quarantine however you are required to wear a well-fitting mask when you are out and around people not in your household.  If your mask becomes wet or soiled, replace with a new one.  Wash your hands often with soap and  water for 20 seconds or clean your hands with an alcohol-based hand sanitizer that contains at least 60% alcohol.  Do not share personal items.  Notify your provider: if you are in close contact with someone who has COVID  or if you develop a fever of 100.4 or greater, sneezing, cough, sore throat, shortness of breath or body aches.           Do not wear jewelry or makeup Do not wear lotions, powders, perfumes, or deodorant. Do not shave 48 hours prior to surgery.   Do not bring valuables to the hospital. DO Not wear nail  polish, gel polish, artificial nails, or any other type of covering on natural nails including finger and toenails. If patients have artificial nails, gel coating, etc. that need to be removed by a nail salon, please have this removed prior to surgery or surgery may need to be canceled/delayed if the surgeon/ anesthesia feels like the patient is unable to be adequately monitored.             [facility] is not responsible for any belongings or valuables.  Do NOT Smoke (Tobacco/Vaping)  24 hours prior to your procedure  If you use a CPAP at night, you may bring your mask for your overnight stay.   Contacts, glasses, hearing aids, dentures or partials may not be worn into surgery, please bring cases for these belongings   For patients admitted to the hospital, discharge time will be determined by your treatment team.   Patients discharged the day of surgery will not be allowed to drive home, and someone needs to stay with them for 24 hours.  NO VISITORS WILL BE ALLOWED IN PRE-OP WHERE PATIENTS ARE PREPPED FOR SURGERY.  ONLY 1 SUPPORT PERSON MAY BE PRESENT IN THE WAITING ROOM WHILE YOU ARE IN SURGERY.  IF YOU ARE TO BE ADMITTED, ONCE YOU ARE IN YOUR ROOM YOU WILL BE ALLOWED TWO (2) VISITORS. 1 (ONE) VISITOR MAY STAY OVERNIGHT BUT MUST ARRIVE TO THE ROOM BY 8pm.  Minor children may have two parents present. Special consideration for safety and communication needs will be reviewed on a case by case basis.  Special instructions:    Oral Hygiene is also important to reduce your risk of infection.  Remember - BRUSH YOUR TEETH THE MORNING OF SURGERY WITH YOUR REGULAR TOOTHPASTE   [facility]- Preparing For Surgery  Before surgery, you can play an important role. Because skin is not sterile, your skin needs to be as free of germs as possible. You can reduce the number of germs on your skin by washing with CHG (chlorahexidine gluconate) Soap before surgery.  CHG is an antiseptic cleaner which kills  germs and bonds with the skin to continue killing germs even after washing.     Please do not use if you have an allergy to CHG or antibacterial soaps. If your skin becomes reddened/irritated stop using the CHG.  Do not shave (including legs and underarms) for at least 48 hours prior to first CHG shower. It is OK to shave your face.  Please follow these instructions carefully.     Shower the NIGHT BEFORE SURGERY and the MORNING OF SURGERY with CHG Soap.   If you chose to wash your hair, wash your hair first as usual with your normal shampoo. After you shampoo, rinse your hair and body thoroughly to remove the shampoo.  Then [employer] and genitals (private parts) with your normal soap and rinse thoroughly to  remove soap.  After that Use CHG Soap as you would any other liquid soap. You can apply CHG directly to the skin and wash gently with a scrungie or a clean washcloth.   Apply the CHG Soap to your body ONLY FROM THE NECK DOWN.  Do not use on open wounds or open sores. Avoid contact with your eyes, ears, mouth and genitals (private parts). Wash Face and genitals (private parts)  with your normal soap.   Wash thoroughly, paying special attention to the area where your surgery will be performed.  Thoroughly rinse your body with warm water from the neck down.  DO NOT shower/wash with your normal soap after using and rinsing off the CHG Soap.  Pat yourself dry with a CLEAN TOWEL.  Wear CLEAN PAJAMAS to bed the night before surgery  Place CLEAN SHEETS on your bed the night before your surgery  DO NOT SLEEP WITH PETS.   Day of Surgery:  Take a shower with CHG soap. Wear Clean/Comfortable clothing the morning of surgery Do not apply any deodorants/lotions.   Remember to brush your teeth WITH YOUR REGULAR TOOTHPASTE.   Please read over the following fact sheets that you were given.

## 2021-03-07 ENCOUNTER — Encounter (HOSPITAL_COMMUNITY)
Admission: RE | Admit: 2021-03-07 | Discharge: 2021-03-07 | Disposition: A | Discharge status: Home / Self Care | Payer: PPO | Source: Ambulatory Visit | Attending: Thoracic Surgery (Cardiothoracic Vascular Surgery) | Admitting: Thoracic Surgery (Cardiothoracic Vascular Surgery)

## 2021-03-07 ENCOUNTER — Ambulatory Visit (HOSPITAL_COMMUNITY)
Admission: RE | Admit: 2021-03-07 | Discharge: 2021-03-07 | Disposition: A | Discharge status: Home / Self Care | Payer: PPO | Source: Ambulatory Visit | Attending: Thoracic Surgery (Cardiothoracic Vascular Surgery) | Admitting: Thoracic Surgery (Cardiothoracic Vascular Surgery)

## 2021-03-07 ENCOUNTER — Encounter (HOSPITAL_COMMUNITY): Payer: Self-pay

## 2021-03-07 ENCOUNTER — Other Ambulatory Visit: Payer: Self-pay

## 2021-03-07 DIAGNOSIS — R531 Weakness: Secondary | ICD-10-CM | POA: Diagnosis not present

## 2021-03-07 DIAGNOSIS — J984 Other disorders of lung: Secondary | ICD-10-CM | POA: Diagnosis not present

## 2021-03-07 DIAGNOSIS — S25492A Other specified injury of left pulmonary blood vessels, initial encounter: Secondary | ICD-10-CM | POA: Diagnosis not present

## 2021-03-07 DIAGNOSIS — D62 Acute posthemorrhagic anemia: Secondary | ICD-10-CM | POA: Diagnosis not present

## 2021-03-07 DIAGNOSIS — E118 Type 2 diabetes mellitus with unspecified complications: Secondary | ICD-10-CM | POA: Insufficient documentation

## 2021-03-07 DIAGNOSIS — J9690 Respiratory failure, unspecified, unspecified whether with hypoxia or hypercapnia: Secondary | ICD-10-CM | POA: Diagnosis not present

## 2021-03-07 DIAGNOSIS — I2510 Atherosclerotic heart disease of native coronary artery without angina pectoris: Secondary | ICD-10-CM | POA: Diagnosis not present

## 2021-03-07 DIAGNOSIS — K8689 Other specified diseases of pancreas: Secondary | ICD-10-CM | POA: Diagnosis present

## 2021-03-07 DIAGNOSIS — E7800 Pure hypercholesterolemia, unspecified: Secondary | ICD-10-CM | POA: Diagnosis not present

## 2021-03-07 DIAGNOSIS — E1165 Type 2 diabetes mellitus with hyperglycemia: Secondary | ICD-10-CM | POA: Diagnosis present

## 2021-03-07 DIAGNOSIS — E872 Acidosis, unspecified: Secondary | ICD-10-CM | POA: Diagnosis present

## 2021-03-07 DIAGNOSIS — J9382 Other air leak: Secondary | ICD-10-CM | POA: Diagnosis not present

## 2021-03-07 DIAGNOSIS — E871 Hypo-osmolality and hyponatremia: Secondary | ICD-10-CM | POA: Diagnosis present

## 2021-03-07 DIAGNOSIS — Z20822 Contact with and (suspected) exposure to covid-19: Secondary | ICD-10-CM | POA: Diagnosis present

## 2021-03-07 DIAGNOSIS — Z86718 Personal history of other venous thrombosis and embolism: Secondary | ICD-10-CM | POA: Insufficient documentation

## 2021-03-07 DIAGNOSIS — Z8542 Personal history of malignant neoplasm of other parts of uterus: Secondary | ICD-10-CM | POA: Diagnosis not present

## 2021-03-07 DIAGNOSIS — I700 Atherosclerosis of aorta: Secondary | ICD-10-CM | POA: Diagnosis present

## 2021-03-07 DIAGNOSIS — Z902 Acquired absence of lung [part of]: Secondary | ICD-10-CM | POA: Diagnosis not present

## 2021-03-07 DIAGNOSIS — R911 Solitary pulmonary nodule: Secondary | ICD-10-CM | POA: Insufficient documentation

## 2021-03-07 DIAGNOSIS — J942 Hemothorax: Secondary | ICD-10-CM | POA: Diagnosis not present

## 2021-03-07 DIAGNOSIS — Y836 Removal of other organ (partial) (total) as the cause of abnormal reaction of the patient, or of later complication, without mention of misadventure at the time of the procedure: Secondary | ICD-10-CM | POA: Diagnosis not present

## 2021-03-07 DIAGNOSIS — J939 Pneumothorax, unspecified: Secondary | ICD-10-CM | POA: Diagnosis not present

## 2021-03-07 DIAGNOSIS — R918 Other nonspecific abnormal finding of lung field: Secondary | ICD-10-CM | POA: Diagnosis not present

## 2021-03-07 DIAGNOSIS — Z01818 Encounter for other preprocedural examination: Secondary | ICD-10-CM | POA: Insufficient documentation

## 2021-03-07 DIAGNOSIS — C7802 Secondary malignant neoplasm of left lung: Secondary | ICD-10-CM | POA: Diagnosis present

## 2021-03-07 DIAGNOSIS — J9601 Acute respiratory failure with hypoxia: Secondary | ICD-10-CM | POA: Diagnosis not present

## 2021-03-07 DIAGNOSIS — I10 Essential (primary) hypertension: Secondary | ICD-10-CM | POA: Diagnosis present

## 2021-03-07 DIAGNOSIS — R0602 Shortness of breath: Secondary | ICD-10-CM | POA: Diagnosis not present

## 2021-03-07 DIAGNOSIS — J90 Pleural effusion, not elsewhere classified: Secondary | ICD-10-CM | POA: Diagnosis present

## 2021-03-07 DIAGNOSIS — I468 Cardiac arrest due to other underlying condition: Secondary | ICD-10-CM | POA: Diagnosis not present

## 2021-03-07 DIAGNOSIS — Z9889 Other specified postprocedural states: Secondary | ICD-10-CM | POA: Diagnosis not present

## 2021-03-07 DIAGNOSIS — I9752 Accidental puncture and laceration of a circulatory system organ or structure during other procedure: Secondary | ICD-10-CM | POA: Diagnosis not present

## 2021-03-07 DIAGNOSIS — J95811 Postprocedural pneumothorax: Secondary | ICD-10-CM | POA: Diagnosis not present

## 2021-03-07 DIAGNOSIS — C541 Malignant neoplasm of endometrium: Secondary | ICD-10-CM | POA: Diagnosis not present

## 2021-03-07 DIAGNOSIS — K760 Fatty (change of) liver, not elsewhere classified: Secondary | ICD-10-CM | POA: Diagnosis present

## 2021-03-07 DIAGNOSIS — D72829 Elevated white blood cell count, unspecified: Secondary | ICD-10-CM | POA: Diagnosis present

## 2021-03-07 DIAGNOSIS — C3412 Malignant neoplasm of upper lobe, left bronchus or lung: Secondary | ICD-10-CM | POA: Diagnosis present

## 2021-03-07 DIAGNOSIS — D509 Iron deficiency anemia, unspecified: Secondary | ICD-10-CM | POA: Diagnosis present

## 2021-03-07 DIAGNOSIS — J9602 Acute respiratory failure with hypercapnia: Secondary | ICD-10-CM | POA: Diagnosis not present

## 2021-03-07 DIAGNOSIS — J9811 Atelectasis: Secondary | ICD-10-CM | POA: Diagnosis not present

## 2021-03-07 DIAGNOSIS — R578 Other shock: Secondary | ICD-10-CM | POA: Diagnosis not present

## 2021-03-07 DIAGNOSIS — J810 Acute pulmonary edema: Secondary | ICD-10-CM | POA: Diagnosis not present

## 2021-03-07 DIAGNOSIS — Z452 Encounter for adjustment and management of vascular access device: Secondary | ICD-10-CM | POA: Diagnosis not present

## 2021-03-07 DIAGNOSIS — Z4682 Encounter for fitting and adjustment of non-vascular catheter: Secondary | ICD-10-CM | POA: Diagnosis not present

## 2021-03-07 DIAGNOSIS — D696 Thrombocytopenia, unspecified: Secondary | ICD-10-CM | POA: Diagnosis not present

## 2021-03-07 LAB — BLOOD GAS, ARTERIAL
Acid-Base Excess: 1.2 mmol/L (ref 0.0–2.0)
Bicarbonate: 24.9 mmol/L (ref 20.0–28.0)
Drawn by: 58793
FIO2: 21
O2 Saturation: 98.8 %
Patient temperature: 37
pCO2 arterial: 36.9 mmHg (ref 32.0–48.0)
pH, Arterial: 7.444 (ref 7.350–7.450)
pO2, Arterial: 123 mmHg — ABNORMAL HIGH (ref 83.0–108.0)

## 2021-03-07 LAB — COMPREHENSIVE METABOLIC PANEL
ALT: 14 U/L (ref 0–44)
AST: 22 U/L (ref 15–41)
Albumin: 3.2 g/dL — ABNORMAL LOW (ref 3.5–5.0)
Alkaline Phosphatase: 106 U/L (ref 38–126)
Anion gap: 8 (ref 5–15)
BUN: 9 mg/dL (ref 8–23)
CO2: 24 mmol/L (ref 22–32)
Calcium: 8.5 mg/dL — ABNORMAL LOW (ref 8.9–10.3)
Chloride: 106 mmol/L (ref 98–111)
Creatinine, Ser: 0.52 mg/dL (ref 0.44–1.00)
GFR, Estimated: >60 mL/min (ref >60)
Glucose, Bld: 194 mg/dL — ABNORMAL HIGH (ref 70–99)
Potassium: 3.3 mmol/L — ABNORMAL LOW (ref 3.5–5.1)
Sodium: 138 mmol/L (ref 135–145)
Total Bilirubin: 0.9 mg/dL (ref 0.3–1.2)
Total Protein: 6 g/dL — ABNORMAL LOW (ref 6.5–8.1)

## 2021-03-07 LAB — CBC
HCT: 39.5 % (ref 36.0–46.0)
Hemoglobin: 12.5 g/dL (ref 12.0–15.0)
MCH: 30.2 pg (ref 26.0–34.0)
MCHC: 31.6 g/dL (ref 30.0–36.0)
MCV: 95.4 fL (ref 80.0–100.0)
Platelets: 155 10*3/uL (ref 150–400)
RBC: 4.14 MIL/uL (ref 3.87–5.11)
RDW: 13.6 % (ref 11.5–15.5)
WBC: 4.1 10*3/uL (ref 4.0–10.5)
nRBC: 0 % (ref 0.0–0.2)

## 2021-03-07 LAB — URINALYSIS, ROUTINE W REFLEX MICROSCOPIC
Bilirubin Urine: NEGATIVE
Glucose, UA: NEGATIVE mg/dL
Hgb urine dipstick: NEGATIVE
Ketones, ur: 5 mg/dL — AB
Nitrite: NEGATIVE
Protein, ur: 100 mg/dL — AB
Specific Gravity, Urine: 1.023 (ref 1.005–1.030)
pH: 5 (ref 5.0–8.0)

## 2021-03-07 LAB — SURGICAL PCR SCREEN
MRSA, PCR: NEGATIVE
Staphylococcus aureus: POSITIVE — AB

## 2021-03-07 LAB — HEMOGLOBIN A1C
Hgb A1c MFr Bld: 6.9 % — ABNORMAL HIGH (ref 4.8–5.6)
Mean Plasma Glucose: 151.33 mg/dL

## 2021-03-07 LAB — SARS CORONAVIRUS 2 (TAT 6-24 HRS): SARS Coronavirus 2: NEGATIVE

## 2021-03-07 LAB — APTT: aPTT: 30 seconds (ref 24–36)

## 2021-03-07 LAB — PROTIME-INR
INR: 1.1 (ref 0.8–1.2)
Prothrombin Time: 14 seconds (ref 11.4–15.2)

## 2021-03-07 LAB — GLUCOSE, CAPILLARY: Glucose-Capillary: 207 mg/dL — ABNORMAL HIGH (ref 70–99)

## 2021-03-07 NOTE — Progress Notes (Signed)
PCP - Dr. [name] Cardiologist - denies  PPM/ICD - denies   Chest x-ray - 03/07/21, at PAT appt EKG - 03/07/21 at PAT appt Stress Test - 05/16/16 ECHO - denies Cardiac Cath - 05/01/2007  Sleep Study - denies  DM- Type 2 Pt checks CBG maybe once a week. She is unsure what her typical fasting blood sugar is  Blood Thinner Instructions: n/a Aspirin Instructions: No instructions given from MD. Pt instructed to continue ASA, and hold on day of surgery   ERAS Protcol - No, NPO   COVID TEST- 03/07/21 at PAT appt, results pending   Anesthesia review: Yes, cardiac hx  Patient denies shortness of breath, fever, cough and chest pain at PAT appointment   All instructions explained to the patient, with a verbal understanding of the material. Patient agrees to go over the instructions while at home for a better understanding. Patient also instructed to wear a mask in public after being tested for COVID-19. The opportunity to ask questions was provided.

## 2021-03-08 ENCOUNTER — Ambulatory Visit (HOSPITAL_COMMUNITY)
Admission: RE | Admit: 2021-03-08 | Discharge: 2021-03-08 | Disposition: A | Discharge status: Home / Self Care | Payer: PPO | Source: Ambulatory Visit | Attending: Thoracic Surgery (Cardiothoracic Vascular Surgery) | Admitting: Thoracic Surgery (Cardiothoracic Vascular Surgery)

## 2021-03-08 DIAGNOSIS — R911 Solitary pulmonary nodule: Secondary | ICD-10-CM

## 2021-03-08 LAB — PULMONARY FUNCTION TEST
DL/VA % pred: 100 %
DL/VA: 4.08 ml/min/mmHg/L
DLCO cor % pred: 79 %
DLCO cor: 15.66 ml/min/mmHg
DLCO unc % pred: 77 %
DLCO unc: 15.21 ml/min/mmHg
FEF 25-75 Post: 2.49 L/sec
FEF 25-75 Pre: 1.76 L/sec
FEF2575-%Change-Post: 41 %
FEF2575-%Pred-Post: 153 %
FEF2575-%Pred-Pre: 108 %
FEV1-%Change-Post: 11 %
FEV1-%Pred-Post: 94 %
FEV1-%Pred-Pre: 84 %
FEV1-Post: 2.02 L
FEV1-Pre: 1.82 L
FEV1FVC-%Change-Post: 4 %
FEV1FVC-%Pred-Pre: 107 %
FEV6-%Change-Post: 6 %
FEV6-%Pred-Post: 88 %
FEV6-%Pred-Pre: 82 %
FEV6-Post: 2.39 L
FEV6-Pre: 2.24 L
FEV6FVC-%Change-Post: 0 %
FEV6FVC-%Pred-Post: 104 %
FEV6FVC-%Pred-Pre: 104 %
FVC-%Change-Post: 6 %
FVC-%Pred-Post: 84 %
FVC-%Pred-Pre: 79 %
FVC-Post: 2.41 L
FVC-Pre: 2.27 L
Post FEV1/FVC ratio: 84 %
Post FEV6/FVC ratio: 99 %
Pre FEV1/FVC ratio: 80 %
Pre FEV6/FVC Ratio: 99 %
RV % pred: 94 %
RV: 2.25 L
TLC % pred: 86 %
TLC: 4.48 L

## 2021-03-08 MED ORDER — ALBUTEROL SULFATE (2.5 MG/3ML) 0.083% IN NEBU
2.5000 mg | INHALATION_SOLUTION | Freq: Once | RESPIRATORY_TRACT | Status: AC
  Administered 2021-03-08: 2.5 mg via RESPIRATORY_TRACT

## 2021-03-08 NOTE — Anesthesia Preprocedure Evaluation (Addendum)
Anesthesia Evaluation  Patient identified by MRN, date of birth, ID band Patient awake    Reviewed: Allergy & Precautions, NPO status , Patient's Chart, lab work & pertinent test results  Airway Mallampati: II  TM Distance: >3 FB Neck ROM: Full    Dental  (+) Teeth Intact   Pulmonary  Pulmonary nodules   Pulmonary exam normal        Cardiovascular hypertension, Pt. on medications + CAD and + DVT   Rhythm:Regular Rate:Normal     Neuro/Psych negative neurological ROS  negative psych ROS   GI/Hepatic Neg liver ROS, PUD,   Endo/Other  diabetes, Type 2  Renal/GU negative Renal ROS  negative genitourinary   Musculoskeletal negative musculoskeletal ROS (+)   Abdominal (+)  Abdomen: soft.    Peds  Hematology  (+) anemia ,   Anesthesia Other Findings   Reproductive/Obstetrics                           Anesthesia Physical Anesthesia Plan  ASA: 3  Anesthesia Plan: General   Post-op Pain Management:    Induction: Intravenous  PONV Risk Score and Plan: 3 and Ondansetron, Dexamethasone and Treatment may vary due to age or medical condition  Airway Management Planned: Mask and Double Lumen EBT  Additional Equipment: Arterial line  Intra-op Plan:   Post-operative Plan: Extubation in OR  Informed Consent: I have reviewed the patients History and Physical, chart, labs and discussed the procedure including the risks, benefits and alternatives for the proposed anesthesia with the patient or authorized representative who has indicated his/her understanding and acceptance.     Dental advisory given  Plan Discussed with: CRNA  Anesthesia Plan Comments: (Lab Results      Component                Value               Date                      WBC                      4.1                 03/07/2021                HGB                      12.5                03/07/2021                HCT                       39.5                03/07/2021                MCV                      95.4                03/07/2021                PLT                      155  03/07/2021           Lab Results      Component                Value               Date                      NA                       138                 03/07/2021                K                        3.3 (L)             03/07/2021                CO2                      24                  03/07/2021                GLUCOSE                  194 (H)             03/07/2021                BUN                      9                   03/07/2021                CREATININE               0.52                03/07/2021                CALCIUM                  8.5 (L)             03/07/2021                GFRNONAA                 >60                 03/07/2021           PAT note by Karoline Caldwell, PA-C: Pertinent hx includes endometrial cancer, adenocarcinoma the vaginal cuff, DVT after surgery, malignant gastrointestinal stromal tumor, Whipple procedure, type 2 diabetes, multiple basal cell carcinomas, coronary atherosclerosis, hypertension, hyperlipidemia, iron deficiency anemia, arthritis, and reflux. She had a hysterectomy in 2018 for endometrial cancer. That was complicated by a DVT in her right leg. More recently she was found to have a recurrence of the vaginal cuff. That was treated with radiation. She had a PET/CT in June which showed a new 9 x 5millimeter left upper lobe lung nodule, which was hypermetabolic with an SUV of 5.   Patient had cardiology evaluation in 2017 for atypical chest pain.  She  was seen by Dr. Gwenlyn Found and he ordered nuclear stress test which was low risk, nonischemic.  Preop labs reviewed, unremarkable, DM2 well-controlled with A1c 6.9.  EKG 03/07/2021: Sinus bradycardia.  Rate 45.  CT chest 01/26/2021: IMPRESSION: 1. Interval enlargement of aggressive appearing left upper lobe pulmonary  nodule which currently measures 1.7 x 1.2 x 1.2 cm. This nodule was previously hypermetabolic on PET-CT. This may represent a solitary metastatic lesion, however, the possibility of a primary bronchogenic neoplasm also warrants consideration, particularly in light of the morphology of the nodule. No new pulmonary nodules. No lymphadenopathy noted in the thorax. 2. Aortic atherosclerosis, in addition to left main and 3 vessel coronary artery disease. Assessment for potential risk factor modification, dietary therapy or pharmacologic therapy may be warranted, if clinically indicated.  Nuclear stress 05/16/2016: . Nuclear stress EF: 59%. . The left ventricular ejection fraction is normal (55-65%). . Blood pressure demonstrated a hypertensive response to exercise. . There was no ST segment deviation noted during stress. . The study is normal. . This is a low risk study.  Normal exercise nuclear stress test with no evidence of prior infarct or ischemia.  Normal exercise capacity. Hypertensive response to exercise.  )      Anesthesia Quick Evaluation

## 2021-03-08 NOTE — Progress Notes (Signed)
Anesthesia Chart Review:  Pertinent hx includes endometrial cancer, adenocarcinoma the vaginal cuff, DVT after surgery, malignant gastrointestinal stromal tumor, Whipple procedure, type 2 diabetes, multiple basal cell carcinomas, coronary atherosclerosis, hypertension, hyperlipidemia, iron deficiency anemia, arthritis, and reflux. She had a hysterectomy in 2018 for endometrial cancer.  That was complicated by a DVT in her right leg.  More recently she was found to have a recurrence of the vaginal cuff.  That was treated with radiation.  She had a PET/CT in June which showed a new 9 x 12 millimeter left upper lobe lung nodule, which was hypermetabolic with an SUV of 5.   Patient had cardiology evaluation in 2017 for atypical chest pain.  She was seen by Dr. Gwenlyn Found and he ordered nuclear stress test which was low risk, nonischemic.  Preop labs reviewed, unremarkable, DM2 well-controlled with A1c 6.9.  EKG 03/07/2021: Sinus bradycardia.  Rate 45.  CT chest 01/26/2021: IMPRESSION: 1. Interval enlargement of aggressive appearing left upper lobe pulmonary nodule which currently measures 1.7 x 1.2 x 1.2 cm. This nodule was previously hypermetabolic on PET-CT. This may represent a solitary metastatic lesion, however, the possibility of a primary bronchogenic neoplasm also warrants consideration, particularly in light of the morphology of the nodule. No new pulmonary nodules. No lymphadenopathy noted in the thorax. 2. Aortic atherosclerosis, in addition to left main and 3 vessel coronary artery disease. Assessment for potential risk factor modification, dietary therapy or pharmacologic therapy may be warranted, if clinically indicated.  Nuclear stress 05/16/2016: Nuclear stress EF: 59%. The left ventricular ejection fraction is normal (55-65%). Blood pressure demonstrated a hypertensive response to exercise. There was no ST segment deviation noted during stress. The study is normal. This is a low  risk study.   Normal exercise nuclear stress test with no evidence of prior infarct or ischemia.  Normal exercise capacity. Hypertensive response to exercise.   Wynonia Musty [facility] Short Stay Center/Anesthesiology Phone [phone] 03/08/2021 9:13 AM

## 2021-03-09 ENCOUNTER — Inpatient Hospital Stay (HOSPITAL_COMMUNITY): Payer: PPO

## 2021-03-09 ENCOUNTER — Encounter (HOSPITAL_COMMUNITY): Payer: Self-pay | Admitting: Thoracic Surgery (Cardiothoracic Vascular Surgery)

## 2021-03-09 ENCOUNTER — Inpatient Hospital Stay (HOSPITAL_COMMUNITY)
Admission: RE | Admit: 2021-03-09 | Discharge: 2021-03-29 | DRG: 163 | Disposition: A | Discharge status: Home Health | Payer: PPO | Attending: Thoracic Surgery (Cardiothoracic Vascular Surgery) | Admitting: Thoracic Surgery (Cardiothoracic Vascular Surgery)

## 2021-03-09 ENCOUNTER — Encounter (HOSPITAL_COMMUNITY)
Admission: RE | Disposition: A | Discharge status: Home Health | Payer: Self-pay | Source: Home / Self Care | Attending: Thoracic Surgery (Cardiothoracic Vascular Surgery)

## 2021-03-09 ENCOUNTER — Inpatient Hospital Stay (HOSPITAL_COMMUNITY): Payer: PPO | Admitting: Physician Assistant

## 2021-03-09 ENCOUNTER — Inpatient Hospital Stay (HOSPITAL_COMMUNITY): Payer: PPO | Admitting: Anesthesiology

## 2021-03-09 DIAGNOSIS — Z794 Long term (current) use of insulin: Secondary | ICD-10-CM

## 2021-03-09 DIAGNOSIS — J810 Acute pulmonary edema: Secondary | ICD-10-CM | POA: Diagnosis not present

## 2021-03-09 DIAGNOSIS — Z20822 Contact with and (suspected) exposure to covid-19: Secondary | ICD-10-CM | POA: Diagnosis present

## 2021-03-09 DIAGNOSIS — J939 Pneumothorax, unspecified: Secondary | ICD-10-CM

## 2021-03-09 DIAGNOSIS — R911 Solitary pulmonary nodule: Secondary | ICD-10-CM | POA: Diagnosis not present

## 2021-03-09 DIAGNOSIS — R578 Other shock: Secondary | ICD-10-CM | POA: Diagnosis not present

## 2021-03-09 DIAGNOSIS — Z9889 Other specified postprocedural states: Secondary | ICD-10-CM

## 2021-03-09 DIAGNOSIS — J9811 Atelectasis: Secondary | ICD-10-CM | POA: Diagnosis not present

## 2021-03-09 DIAGNOSIS — S25492A Other specified injury of left pulmonary blood vessels, initial encounter: Secondary | ICD-10-CM | POA: Diagnosis not present

## 2021-03-09 DIAGNOSIS — J942 Hemothorax: Secondary | ICD-10-CM | POA: Diagnosis not present

## 2021-03-09 DIAGNOSIS — J9382 Other air leak: Secondary | ICD-10-CM | POA: Diagnosis not present

## 2021-03-09 DIAGNOSIS — R918 Other nonspecific abnormal finding of lung field: Secondary | ICD-10-CM | POA: Diagnosis not present

## 2021-03-09 DIAGNOSIS — Z7982 Long term (current) use of aspirin: Secondary | ICD-10-CM

## 2021-03-09 DIAGNOSIS — E1165 Type 2 diabetes mellitus with hyperglycemia: Secondary | ICD-10-CM | POA: Diagnosis present

## 2021-03-09 DIAGNOSIS — K219 Gastro-esophageal reflux disease without esophagitis: Secondary | ICD-10-CM | POA: Diagnosis present

## 2021-03-09 DIAGNOSIS — K5900 Constipation, unspecified: Secondary | ICD-10-CM | POA: Diagnosis present

## 2021-03-09 DIAGNOSIS — D509 Iron deficiency anemia, unspecified: Secondary | ICD-10-CM | POA: Diagnosis present

## 2021-03-09 DIAGNOSIS — Z90722 Acquired absence of ovaries, bilateral: Secondary | ICD-10-CM

## 2021-03-09 DIAGNOSIS — J90 Pleural effusion, not elsewhere classified: Secondary | ICD-10-CM | POA: Diagnosis not present

## 2021-03-09 DIAGNOSIS — C7802 Secondary malignant neoplasm of left lung: Secondary | ICD-10-CM | POA: Diagnosis not present

## 2021-03-09 DIAGNOSIS — E876 Hypokalemia: Secondary | ICD-10-CM | POA: Diagnosis present

## 2021-03-09 DIAGNOSIS — Z886 Allergy status to analgesic agent status: Secondary | ICD-10-CM

## 2021-03-09 DIAGNOSIS — E7800 Pure hypercholesterolemia, unspecified: Secondary | ICD-10-CM | POA: Diagnosis present

## 2021-03-09 DIAGNOSIS — J9601 Acute respiratory failure with hypoxia: Secondary | ICD-10-CM | POA: Diagnosis not present

## 2021-03-09 DIAGNOSIS — G4700 Insomnia, unspecified: Secondary | ICD-10-CM | POA: Diagnosis present

## 2021-03-09 DIAGNOSIS — Z5332 Thoracoscopic surgical procedure converted to open procedure: Secondary | ICD-10-CM

## 2021-03-09 DIAGNOSIS — J181 Lobar pneumonia, unspecified organism: Secondary | ICD-10-CM

## 2021-03-09 DIAGNOSIS — E872 Acidosis, unspecified: Secondary | ICD-10-CM | POA: Diagnosis present

## 2021-03-09 DIAGNOSIS — I2510 Atherosclerotic heart disease of native coronary artery without angina pectoris: Secondary | ICD-10-CM | POA: Diagnosis present

## 2021-03-09 DIAGNOSIS — D72829 Elevated white blood cell count, unspecified: Secondary | ICD-10-CM | POA: Diagnosis present

## 2021-03-09 DIAGNOSIS — R531 Weakness: Secondary | ICD-10-CM | POA: Diagnosis not present

## 2021-03-09 DIAGNOSIS — Y836 Removal of other organ (partial) (total) as the cause of abnormal reaction of the patient, or of later complication, without mention of misadventure at the time of the procedure: Secondary | ICD-10-CM | POA: Diagnosis not present

## 2021-03-09 DIAGNOSIS — Z9079 Acquired absence of other genital organ(s): Secondary | ICD-10-CM

## 2021-03-09 DIAGNOSIS — Z90710 Acquired absence of both cervix and uterus: Secondary | ICD-10-CM

## 2021-03-09 DIAGNOSIS — Z7984 Long term (current) use of oral hypoglycemic drugs: Secondary | ICD-10-CM

## 2021-03-09 DIAGNOSIS — K8689 Other specified diseases of pancreas: Secondary | ICD-10-CM | POA: Diagnosis present

## 2021-03-09 DIAGNOSIS — J95811 Postprocedural pneumothorax: Secondary | ICD-10-CM | POA: Diagnosis not present

## 2021-03-09 DIAGNOSIS — I468 Cardiac arrest due to other underlying condition: Secondary | ICD-10-CM | POA: Diagnosis not present

## 2021-03-09 DIAGNOSIS — E871 Hypo-osmolality and hyponatremia: Secondary | ICD-10-CM | POA: Diagnosis present

## 2021-03-09 DIAGNOSIS — Z86718 Personal history of other venous thrombosis and embolism: Secondary | ICD-10-CM

## 2021-03-09 DIAGNOSIS — Z8542 Personal history of malignant neoplasm of other parts of uterus: Secondary | ICD-10-CM | POA: Diagnosis not present

## 2021-03-09 DIAGNOSIS — Z09 Encounter for follow-up examination after completed treatment for conditions other than malignant neoplasm: Secondary | ICD-10-CM

## 2021-03-09 DIAGNOSIS — Z4682 Encounter for fitting and adjustment of non-vascular catheter: Secondary | ICD-10-CM | POA: Diagnosis not present

## 2021-03-09 DIAGNOSIS — I10 Essential (primary) hypertension: Secondary | ICD-10-CM | POA: Diagnosis present

## 2021-03-09 DIAGNOSIS — Z8719 Personal history of other diseases of the digestive system: Secondary | ICD-10-CM

## 2021-03-09 DIAGNOSIS — Z833 Family history of diabetes mellitus: Secondary | ICD-10-CM

## 2021-03-09 DIAGNOSIS — J9602 Acute respiratory failure with hypercapnia: Secondary | ICD-10-CM | POA: Diagnosis not present

## 2021-03-09 DIAGNOSIS — Z419 Encounter for procedure for purposes other than remedying health state, unspecified: Secondary | ICD-10-CM

## 2021-03-09 DIAGNOSIS — I9752 Accidental puncture and laceration of a circulatory system organ or structure during other procedure: Secondary | ICD-10-CM | POA: Diagnosis not present

## 2021-03-09 DIAGNOSIS — J984 Other disorders of lung: Secondary | ICD-10-CM | POA: Diagnosis not present

## 2021-03-09 DIAGNOSIS — D62 Acute posthemorrhagic anemia: Secondary | ICD-10-CM | POA: Diagnosis not present

## 2021-03-09 DIAGNOSIS — M1990 Unspecified osteoarthritis, unspecified site: Secondary | ICD-10-CM | POA: Diagnosis present

## 2021-03-09 DIAGNOSIS — Z9689 Presence of other specified functional implants: Secondary | ICD-10-CM

## 2021-03-09 DIAGNOSIS — Z79899 Other long term (current) drug therapy: Secondary | ICD-10-CM

## 2021-03-09 DIAGNOSIS — D696 Thrombocytopenia, unspecified: Secondary | ICD-10-CM | POA: Diagnosis not present

## 2021-03-09 DIAGNOSIS — I700 Atherosclerosis of aorta: Secondary | ICD-10-CM | POA: Diagnosis present

## 2021-03-09 DIAGNOSIS — Z85828 Personal history of other malignant neoplasm of skin: Secondary | ICD-10-CM

## 2021-03-09 DIAGNOSIS — C3490 Malignant neoplasm of unspecified part of unspecified bronchus or lung: Secondary | ICD-10-CM | POA: Diagnosis present

## 2021-03-09 DIAGNOSIS — K760 Fatty (change of) liver, not elsewhere classified: Secondary | ICD-10-CM | POA: Diagnosis present

## 2021-03-09 DIAGNOSIS — Z9049 Acquired absence of other specified parts of digestive tract: Secondary | ICD-10-CM

## 2021-03-09 DIAGNOSIS — Z902 Acquired absence of lung [part of]: Secondary | ICD-10-CM | POA: Diagnosis not present

## 2021-03-09 DIAGNOSIS — J9690 Respiratory failure, unspecified, unspecified whether with hypoxia or hypercapnia: Secondary | ICD-10-CM | POA: Diagnosis not present

## 2021-03-09 DIAGNOSIS — Z85831 Personal history of malignant neoplasm of soft tissue: Secondary | ICD-10-CM

## 2021-03-09 DIAGNOSIS — I480 Paroxysmal atrial fibrillation: Secondary | ICD-10-CM | POA: Diagnosis present

## 2021-03-09 DIAGNOSIS — Z452 Encounter for adjustment and management of vascular access device: Secondary | ICD-10-CM | POA: Diagnosis not present

## 2021-03-09 DIAGNOSIS — R1310 Dysphagia, unspecified: Secondary | ICD-10-CM | POA: Diagnosis not present

## 2021-03-09 DIAGNOSIS — Z8249 Family history of ischemic heart disease and other diseases of the circulatory system: Secondary | ICD-10-CM

## 2021-03-09 DIAGNOSIS — Z923 Personal history of irradiation: Secondary | ICD-10-CM

## 2021-03-09 DIAGNOSIS — C3412 Malignant neoplasm of upper lobe, left bronchus or lung: Secondary | ICD-10-CM | POA: Diagnosis present

## 2021-03-09 DIAGNOSIS — C541 Malignant neoplasm of endometrium: Secondary | ICD-10-CM | POA: Diagnosis not present

## 2021-03-09 DIAGNOSIS — R0602 Shortness of breath: Secondary | ICD-10-CM | POA: Diagnosis not present

## 2021-03-09 HISTORY — PX: INTERCOSTAL NERVE BLOCK: SHX5021

## 2021-03-09 HISTORY — PX: LYMPH NODE DISSECTION: SHX5087

## 2021-03-09 HISTORY — PX: THORACOTOMY: SHX5074

## 2021-03-09 LAB — COMPREHENSIVE METABOLIC PANEL
ALT: 41 U/L (ref 0–44)
AST: 86 U/L — ABNORMAL HIGH (ref 15–41)
Albumin: 2.5 g/dL — ABNORMAL LOW (ref 3.5–5.0)
Alkaline Phosphatase: 49 U/L (ref 38–126)
Anion gap: 24 — ABNORMAL HIGH (ref 5–15)
BUN: 10 mg/dL (ref 8–23)
CO2: 16 mmol/L — ABNORMAL LOW (ref 22–32)
Calcium: 8.3 mg/dL — ABNORMAL LOW (ref 8.9–10.3)
Chloride: 103 mmol/L (ref 98–111)
Creatinine, Ser: 0.98 mg/dL (ref 0.44–1.00)
GFR, Estimated: 59 mL/min — ABNORMAL LOW (ref >60)
Glucose, Bld: 394 mg/dL — ABNORMAL HIGH (ref 70–99)
Potassium: 3.6 mmol/L (ref 3.5–5.1)
Sodium: 143 mmol/L (ref 135–145)
Total Bilirubin: 2 mg/dL — ABNORMAL HIGH (ref 0.3–1.2)
Total Protein: 3.9 g/dL — ABNORMAL LOW (ref 6.5–8.1)

## 2021-03-09 LAB — CBC
HCT: 30.2 % — ABNORMAL LOW (ref 36.0–46.0)
HCT: 22 % — ABNORMAL LOW (ref 36.0–46.0)
Hemoglobin: 9.8 g/dL — ABNORMAL LOW (ref 12.0–15.0)
Hemoglobin: 7.5 g/dL — ABNORMAL LOW (ref 12.0–15.0)
MCH: 30.6 pg (ref 26.0–34.0)
MCH: 31 pg (ref 26.0–34.0)
MCHC: 32.5 g/dL (ref 30.0–36.0)
MCHC: 34.1 g/dL (ref 30.0–36.0)
MCV: 94.4 fL (ref 80.0–100.0)
MCV: 90.9 fL (ref 80.0–100.0)
Platelets: 51 10*3/uL — ABNORMAL LOW (ref 150–400)
Platelets: 46 10*3/uL — ABNORMAL LOW (ref 150–400)
RBC: 3.2 MIL/uL — ABNORMAL LOW (ref 3.87–5.11)
RBC: 2.42 MIL/uL — ABNORMAL LOW (ref 3.87–5.11)
RDW: 16.3 % — ABNORMAL HIGH (ref 11.5–15.5)
RDW: 15.9 % — ABNORMAL HIGH (ref 11.5–15.5)
WBC: 9.5 10*3/uL (ref 4.0–10.5)
WBC: 4.9 10*3/uL (ref 4.0–10.5)
nRBC: 0.2 % (ref 0.0–0.2)
nRBC: 0 % (ref 0.0–0.2)

## 2021-03-09 LAB — GLUCOSE, CAPILLARY
Glucose-Capillary: 147 mg/dL — ABNORMAL HIGH (ref 70–99)
Glucose-Capillary: 368 mg/dL — ABNORMAL HIGH (ref 70–99)
Glucose-Capillary: 363 mg/dL — ABNORMAL HIGH (ref 70–99)
Glucose-Capillary: 360 mg/dL — ABNORMAL HIGH (ref 70–99)
Glucose-Capillary: 361 mg/dL — ABNORMAL HIGH (ref 70–99)

## 2021-03-09 LAB — POCT I-STAT 7, (LYTES, BLD GAS, ICA,H+H)
Acid-base deficit: 16 mmol/L — ABNORMAL HIGH (ref 0.0–2.0)
Acid-base deficit: 4 mmol/L — ABNORMAL HIGH (ref 0.0–2.0)
Acid-base deficit: 10 mmol/L — ABNORMAL HIGH (ref 0.0–2.0)
Acid-base deficit: 11 mmol/L — ABNORMAL HIGH (ref 0.0–2.0)
Acid-base deficit: 7 mmol/L — ABNORMAL HIGH (ref 0.0–2.0)
Bicarbonate: 17.4 mmol/L — ABNORMAL LOW (ref 20.0–28.0)
Bicarbonate: 22.2 mmol/L (ref 20.0–28.0)
Bicarbonate: 15.8 mmol/L — ABNORMAL LOW (ref 20.0–28.0)
Bicarbonate: 17.4 mmol/L — ABNORMAL LOW (ref 20.0–28.0)
Bicarbonate: 18.2 mmol/L — ABNORMAL LOW (ref 20.0–28.0)
Calcium, Ion: 1.1 mmol/L — ABNORMAL LOW (ref 1.15–1.40)
Calcium, Ion: 1.45 mmol/L — ABNORMAL HIGH (ref 1.15–1.40)
Calcium, Ion: 1.49 mmol/L — ABNORMAL HIGH (ref 1.15–1.40)
Calcium, Ion: 1.52 mmol/L (ref 1.15–1.40)
Calcium, Ion: 0.96 mmol/L — ABNORMAL LOW (ref 1.15–1.40)
HCT: 27 % — ABNORMAL LOW (ref 36.0–46.0)
HCT: 27 % — ABNORMAL LOW (ref 36.0–46.0)
HCT: 24 % — ABNORMAL LOW (ref 36.0–46.0)
HCT: 26 % — ABNORMAL LOW (ref 36.0–46.0)
HCT: 17 % — ABNORMAL LOW (ref 36.0–46.0)
Hemoglobin: 9.2 g/dL — ABNORMAL LOW (ref 12.0–15.0)
Hemoglobin: 9.2 g/dL — ABNORMAL LOW (ref 12.0–15.0)
Hemoglobin: 8.2 g/dL — ABNORMAL LOW (ref 12.0–15.0)
Hemoglobin: 8.8 g/dL — ABNORMAL LOW (ref 12.0–15.0)
Hemoglobin: 5.8 g/dL — CL (ref 12.0–15.0)
O2 Saturation: 100 %
O2 Saturation: 92 %
O2 Saturation: 100 %
O2 Saturation: 99 %
O2 Saturation: 100 %
Patient temperature: 35
Patient temperature: 35.5
Patient temperature: 35.2
Potassium: 4.6 mmol/L (ref 3.5–5.1)
Potassium: 6.3 mmol/L (ref 3.5–5.1)
Potassium: 4.5 mmol/L (ref 3.5–5.1)
Potassium: 4.6 mmol/L (ref 3.5–5.1)
Potassium: 3.5 mmol/L (ref 3.5–5.1)
Sodium: 138 mmol/L (ref 135–145)
Sodium: 140 mmol/L (ref 135–145)
Sodium: 137 mmol/L (ref 135–145)
Sodium: 139 mmol/L (ref 135–145)
Sodium: 140 mmol/L (ref 135–145)
TCO2: 20 mmol/L — ABNORMAL LOW (ref 22–32)
TCO2: 23 mmol/L (ref 22–32)
TCO2: 17 mmol/L — ABNORMAL LOW (ref 22–32)
TCO2: 19 mmol/L — ABNORMAL LOW (ref 22–32)
TCO2: 19 mmol/L — ABNORMAL LOW (ref 22–32)
pCO2 arterial: 40.9 mmHg (ref 32.0–48.0)
pCO2 arterial: 87.2 mmHg (ref 32.0–48.0)
pCO2 arterial: 37.2 mmHg (ref 32.0–48.0)
pCO2 arterial: 44.1 mmHg (ref 32.0–48.0)
pCO2 arterial: 31.7 mmHg — ABNORMAL LOW (ref 32.0–48.0)
pH, Arterial: 6.892 — CL (ref 7.350–7.450)
pH, Arterial: 7.335 — ABNORMAL LOW (ref 7.350–7.450)
pH, Arterial: 7.204 — ABNORMAL LOW (ref 7.350–7.450)
pH, Arterial: 7.236 — ABNORMAL LOW (ref 7.350–7.450)
pH, Arterial: 7.359 (ref 7.350–7.450)
pO2, Arterial: 395 mmHg — ABNORMAL HIGH (ref 83.0–108.0)
pO2, Arterial: 64 mmHg — ABNORMAL LOW (ref 83.0–108.0)
pO2, Arterial: 179 mmHg — ABNORMAL HIGH (ref 83.0–108.0)
pO2, Arterial: 265 mmHg — ABNORMAL HIGH (ref 83.0–108.0)
pO2, Arterial: 214 mmHg — ABNORMAL HIGH (ref 83.0–108.0)

## 2021-03-09 LAB — DIC (DISSEMINATED INTRAVASCULAR COAGULATION)PANEL
D-Dimer, Quant: 8.39 ug{FEU}/mL — ABNORMAL HIGH (ref 0.00–0.50)
D-Dimer, Quant: >20 ug/mL-FEU — ABNORMAL HIGH (ref 0.00–0.50)
Fibrinogen: 144 mg/dL — ABNORMAL LOW (ref 210–475)
Fibrinogen: 156 mg/dL — ABNORMAL LOW (ref 210–475)
INR: 2.1 — ABNORMAL HIGH (ref 0.8–1.2)
INR: 2.2 — ABNORMAL HIGH (ref 0.8–1.2)
Platelets: 73 K/uL — ABNORMAL LOW (ref 150–400)
Platelets: 46 10*3/uL — ABNORMAL LOW (ref 150–400)
Prothrombin Time: 23.6 s — ABNORMAL HIGH (ref 11.4–15.2)
Prothrombin Time: 24.1 seconds — ABNORMAL HIGH (ref 11.4–15.2)
Smear Review: NONE SEEN
Smear Review: NONE SEEN
aPTT: 60 s — ABNORMAL HIGH (ref 24–36)
aPTT: 88 seconds — ABNORMAL HIGH (ref 24–36)

## 2021-03-09 LAB — PROTIME-INR
INR: 2.8 — ABNORMAL HIGH (ref 0.8–1.2)
Prothrombin Time: 29.3 seconds — ABNORMAL HIGH (ref 11.4–15.2)

## 2021-03-09 LAB — HEMOGLOBIN AND HEMATOCRIT, BLOOD
HCT: 31.9 % — ABNORMAL LOW (ref 36.0–46.0)
Hemoglobin: 10.2 g/dL — ABNORMAL LOW (ref 12.0–15.0)

## 2021-03-09 LAB — PREPARE RBC (CROSSMATCH)

## 2021-03-09 LAB — MASSIVE TRANSFUSION PROTOCOL ORDER (BLOOD BANK NOTIFICATION)

## 2021-03-09 LAB — BASIC METABOLIC PANEL
Anion gap: 11 (ref 5–15)
BUN: 9 mg/dL (ref 8–23)
CO2: 17 mmol/L — ABNORMAL LOW (ref 22–32)
Calcium: 9.3 mg/dL (ref 8.9–10.3)
Chloride: 109 mmol/L (ref 98–111)
Creatinine, Ser: 0.77 mg/dL (ref 0.44–1.00)
GFR, Estimated: >60 mL/min (ref >60)
Glucose, Bld: 424 mg/dL — ABNORMAL HIGH (ref 70–99)
Potassium: 4.7 mmol/L (ref 3.5–5.1)
Sodium: 137 mmol/L (ref 135–145)

## 2021-03-09 LAB — PREPARE PLATELET PHERESIS: Unit division: 0

## 2021-03-09 LAB — BPAM PLATELET PHERESIS
Blood Product Expiration Date: 202210152359
ISSUE DATE / TIME: 202210131225
Unit Type and Rh: 5100

## 2021-03-09 LAB — APTT: aPTT: 84 seconds — ABNORMAL HIGH (ref 24–36)

## 2021-03-09 SURGERY — WEDGE RESECTION, LUNG, ROBOT-ASSISTED, THORACOSCOPIC
Anesthesia: General | Site: Chest | Laterality: Left

## 2021-03-09 MED ORDER — PANTOPRAZOLE SODIUM 40 MG IV SOLR
40.0000 mg | Freq: Every day | INTRAVENOUS | Status: DC
  Administered 2021-03-10 – 2021-03-11 (×2): 40 mg via INTRAVENOUS
  Filled 2021-03-09 (×2): qty 40

## 2021-03-09 MED ORDER — INSULIN REGULAR(HUMAN) IN NACL 100-0.9 UT/100ML-% IV SOLN
INTRAVENOUS | Status: DC
  Administered 2021-03-09: 4.6 [IU]/h via INTRAVENOUS
  Administered 2021-03-10: 9 [IU]/h via INTRAVENOUS
  Administered 2021-03-10: 16 [IU]/h via INTRAVENOUS
  Filled 2021-03-09 (×3): qty 100

## 2021-03-09 MED ORDER — ROCURONIUM BROMIDE 10 MG/ML (PF) SYRINGE
PREFILLED_SYRINGE | INTRAVENOUS | Status: AC
  Filled 2021-03-09: qty 10

## 2021-03-09 MED ORDER — HEMOSTATIC AGENTS (NO CHARGE) OPTIME
TOPICAL | Status: DC | PRN
  Administered 2021-03-09: 1 via TOPICAL

## 2021-03-09 MED ORDER — SODIUM CHLORIDE 0.9% IV SOLUTION: Freq: Once | INTRAVENOUS | Status: DC

## 2021-03-09 MED ORDER — PHENYLEPHRINE HCL-NACL 20-0.9 MG/250ML-% IV SOLN
0.0000 ug/min | INTRAVENOUS | Status: DC
  Administered 2021-03-09: 75 ug/min via INTRAVENOUS
  Administered 2021-03-09: 100 ug/min via INTRAVENOUS
  Filled 2021-03-09 (×2): qty 250

## 2021-03-09 MED ORDER — ALBUMIN HUMAN 5 % IV SOLN
INTRAVENOUS | Status: AC
  Administered 2021-03-09: 12.5 g
  Filled 2021-03-09: qty 250

## 2021-03-09 MED ORDER — CHLORHEXIDINE GLUCONATE CLOTH 2 % EX PADS
6.0000 | MEDICATED_PAD | Freq: Every day | CUTANEOUS | Status: DC
  Administered 2021-03-10 – 2021-03-23 (×14): 6 via TOPICAL

## 2021-03-09 MED ORDER — POLYETHYLENE GLYCOL 3350 17 G PO PACK
17.0000 g | PACK | Freq: Every day | ORAL | Status: DC
  Administered 2021-03-12 – 2021-03-27 (×6): 17 g
  Filled 2021-03-09 (×12): qty 1

## 2021-03-09 MED ORDER — ORAL CARE MOUTH RINSE
15.0000 mL | OROMUCOSAL | Status: DC
  Administered 2021-03-10 (×4): 15 mL via OROMUCOSAL

## 2021-03-09 MED ORDER — PHENYLEPHRINE HCL-NACL 20-0.9 MG/250ML-% IV SOLN
INTRAVENOUS | Status: DC | PRN
  Administered 2021-03-09: 25 ug/min via INTRAVENOUS

## 2021-03-09 MED ORDER — FENTANYL CITRATE (PF) 250 MCG/5ML IJ SOLN
INTRAMUSCULAR | Status: DC | PRN
  Administered 2021-03-09: 100 ug via INTRAVENOUS
  Administered 2021-03-09: 25 ug via INTRAVENOUS
  Administered 2021-03-09: 50 ug via INTRAVENOUS
  Administered 2021-03-09: 25 ug via INTRAVENOUS

## 2021-03-09 MED ORDER — NOREPINEPHRINE 4 MG/250ML-% IV SOLN
0.0000 ug/min | INTRAVENOUS | Status: DC
Start: 2021-03-09 — End: 2021-03-11
  Administered 2021-03-09: 25 ug/min via INTRAVENOUS
  Administered 2021-03-09: 14 ug/min via INTRAVENOUS
  Administered 2021-03-09: 30 ug/min via INTRAVENOUS
  Administered 2021-03-10: 10 ug/min via INTRAVENOUS
  Administered 2021-03-10: 13 ug/min via INTRAVENOUS
  Filled 2021-03-09 (×5): qty 250

## 2021-03-09 MED ORDER — FENTANYL CITRATE PF 50 MCG/ML IJ SOSY
25.0000 ug | PREFILLED_SYRINGE | INTRAMUSCULAR | Status: DC | PRN
  Administered 2021-03-10: 25 ug via INTRAVENOUS

## 2021-03-09 MED ORDER — PROPOFOL 10 MG/ML IV BOLUS
INTRAVENOUS | Status: AC
  Filled 2021-03-09: qty 20

## 2021-03-09 MED ORDER — PROPOFOL 1000 MG/100ML IV EMUL: 0.0000 ug/kg/min | INTRAVENOUS | Status: DC

## 2021-03-09 MED ORDER — FUROSEMIDE 10 MG/ML IJ SOLN
INTRAMUSCULAR | Status: AC
  Filled 2021-03-09: qty 4

## 2021-03-09 MED ORDER — CEFAZOLIN SODIUM-DEXTROSE 2-4 GM/100ML-% IV SOLN
2.0000 g | INTRAVENOUS | Status: AC
  Administered 2021-03-09: 2 g via INTRAVENOUS
  Filled 2021-03-09: qty 100

## 2021-03-09 MED ORDER — SODIUM BICARBONATE 8.4 % IV SOLN
INTRAVENOUS | Status: DC
  Filled 2021-03-09 (×2): qty 1000

## 2021-03-09 MED ORDER — DEXAMETHASONE SODIUM PHOSPHATE 10 MG/ML IJ SOLN
INTRAMUSCULAR | Status: DC | PRN
  Administered 2021-03-09: 10 mg via INTRAVENOUS

## 2021-03-09 MED ORDER — MIDAZOLAM HCL 2 MG/2ML IJ SOLN
INTRAMUSCULAR | Status: AC
  Filled 2021-03-09: qty 2

## 2021-03-09 MED ORDER — BUPIVACAINE HCL (PF) 0.5 % IJ SOLN
INTRAMUSCULAR | Status: AC
  Filled 2021-03-09: qty 30

## 2021-03-09 MED ORDER — DEXAMETHASONE SODIUM PHOSPHATE 10 MG/ML IJ SOLN
INTRAMUSCULAR | Status: AC
  Filled 2021-03-09: qty 1

## 2021-03-09 MED ORDER — SODIUM CHLORIDE 0.9 % IV SOLN: INTRAVENOUS | Status: DC | PRN

## 2021-03-09 MED ORDER — EPINEPHRINE 1 MG/10ML IJ SOSY
PREFILLED_SYRINGE | INTRAMUSCULAR | Status: DC | PRN
  Administered 2021-03-09 (×2): 1 mg via INTRAVENOUS

## 2021-03-09 MED ORDER — PROPOFOL 1000 MG/100ML IV EMUL: 5.0000 ug/kg/min | INTRAVENOUS | Status: DC

## 2021-03-09 MED ORDER — PROPOFOL 10 MG/ML IV BOLUS
INTRAVENOUS | Status: DC | PRN
  Administered 2021-03-09: 100 mg via INTRAVENOUS
  Administered 2021-03-09: 20 ug/kg/min via INTRAVENOUS
  Administered 2021-03-09: 50 mg via INTRAVENOUS
  Administered 2021-03-09: 30 mg via INTRAVENOUS
  Administered 2021-03-09: 50 mg via INTRAVENOUS

## 2021-03-09 MED ORDER — BUPIVACAINE LIPOSOME 1.3 % IJ SUSP
INTRAMUSCULAR | Status: AC
  Filled 2021-03-09: qty 20

## 2021-03-09 MED ORDER — DEXTROSE 50 % IV SOLN: 0.0000 mL | INTRAVENOUS | Status: DC | PRN

## 2021-03-09 MED ORDER — EPHEDRINE SULFATE-NACL 50-0.9 MG/10ML-% IV SOSY
PREFILLED_SYRINGE | INTRAVENOUS | Status: DC | PRN
  Administered 2021-03-09 (×2): 5 mg via INTRAVENOUS
  Administered 2021-03-09: 10 mg via INTRAVENOUS
  Administered 2021-03-09: 2.5 mg via INTRAVENOUS

## 2021-03-09 MED ORDER — FENTANYL CITRATE (PF) 250 MCG/5ML IJ SOLN
INTRAMUSCULAR | Status: AC
  Filled 2021-03-09: qty 5

## 2021-03-09 MED ORDER — VASOPRESSIN 20 UNITS/100 ML INFUSION FOR SHOCK: 0.0000 [IU]/min | INTRAVENOUS | Status: DC

## 2021-03-09 MED ORDER — CHLORHEXIDINE GLUCONATE 0.12 % MT SOLN
15.0000 mL | Freq: Once | OROMUCOSAL | Status: AC
  Administered 2021-03-09: 15 mL via OROMUCOSAL
  Filled 2021-03-09: qty 15

## 2021-03-09 MED ORDER — ROCURONIUM BROMIDE 10 MG/ML (PF) SYRINGE
PREFILLED_SYRINGE | INTRAVENOUS | Status: DC | PRN
Start: 2021-03-09 — End: 2021-03-09
  Administered 2021-03-09: 60 mg via INTRAVENOUS
  Administered 2021-03-09 (×2): 50 mg via INTRAVENOUS
  Administered 2021-03-09 (×2): 10 mg via INTRAVENOUS

## 2021-03-09 MED ORDER — LIDOCAINE 2% (20 MG/ML) 5 ML SYRINGE
INTRAMUSCULAR | Status: DC | PRN
  Administered 2021-03-09: 100 mg via INTRAVENOUS
  Administered 2021-03-09: 40 mg via INTRAVENOUS
  Administered 2021-03-09: 60 mg via INTRAVENOUS

## 2021-03-09 MED ORDER — DOCUSATE SODIUM 50 MG/5ML PO LIQD
100.0000 mg | Freq: Two times a day (BID) | ORAL | Status: DC
  Administered 2021-03-10 – 2021-03-11 (×2): 100 mg
  Filled 2021-03-09 (×2): qty 10

## 2021-03-09 MED ORDER — SODIUM CHLORIDE 0.9% IV SOLUTION: Freq: Once | INTRAVENOUS | Status: AC

## 2021-03-09 MED ORDER — FENTANYL CITRATE PF 50 MCG/ML IJ SOSY
25.0000 ug | PREFILLED_SYRINGE | INTRAMUSCULAR | Status: DC | PRN
  Filled 2021-03-09: qty 1

## 2021-03-09 MED ORDER — CALCIUM CHLORIDE 10 % IV SOLN
INTRAVENOUS | Status: DC | PRN
  Administered 2021-03-09: 200 mg via INTRAVENOUS
  Administered 2021-03-09: 1 g via INTRAVENOUS
  Administered 2021-03-09 (×2): 200 mg via INTRAVENOUS
  Administered 2021-03-09: .5 g via INTRAVENOUS
  Administered 2021-03-09 (×2): 200 mg via INTRAVENOUS

## 2021-03-09 MED ORDER — SODIUM BICARBONATE 8.4 % IV SOLN
INTRAVENOUS | Status: AC
  Administered 2021-03-09: 50 meq
  Filled 2021-03-09: qty 100

## 2021-03-09 MED ORDER — SODIUM CHLORIDE FLUSH 0.9 % IV SOLN
INTRAVENOUS | Status: DC | PRN
  Administered 2021-03-09: 90 mL

## 2021-03-09 MED ORDER — LACTATED RINGERS IV SOLN: INTRAVENOUS | Status: DC

## 2021-03-09 MED ORDER — ORAL CARE MOUTH RINSE: 15.0000 mL | Freq: Once | OROMUCOSAL | Status: AC

## 2021-03-09 MED ORDER — SODIUM BICARBONATE 8.4 % IV SOLN
INTRAVENOUS | Status: AC
  Administered 2021-03-09: 50 meq
  Filled 2021-03-09: qty 50

## 2021-03-09 MED ORDER — CHLORHEXIDINE GLUCONATE 0.12% ORAL RINSE (MEDLINE KIT)
15.0000 mL | Freq: Two times a day (BID) | OROMUCOSAL | Status: DC
  Administered 2021-03-09 – 2021-03-29 (×29): 15 mL via OROMUCOSAL

## 2021-03-09 MED ORDER — ONDANSETRON HCL 4 MG/2ML IJ SOLN
INTRAMUSCULAR | Status: AC
  Filled 2021-03-09: qty 2

## 2021-03-09 MED ORDER — VASOPRESSIN 20 UNIT/ML IV SOLN
INTRAVENOUS | Status: DC | PRN
  Administered 2021-03-09 (×3): 2 [IU] via INTRAVENOUS
  Administered 2021-03-09: 3 [IU] via INTRAVENOUS

## 2021-03-09 MED ORDER — LACTATED RINGERS IV SOLN: INTRAVENOUS | Status: DC | PRN

## 2021-03-09 MED ORDER — 0.9 % SODIUM CHLORIDE (POUR BTL) OPTIME
TOPICAL | Status: DC | PRN
  Administered 2021-03-09: 2000 mL

## 2021-03-09 MED ORDER — LIDOCAINE 2% (20 MG/ML) 5 ML SYRINGE
INTRAMUSCULAR | Status: AC
  Filled 2021-03-09: qty 5

## 2021-03-09 MED ORDER — SODIUM BICARBONATE 8.4 % IV SOLN
100.0000 meq | Freq: Once | INTRAVENOUS | Status: AC
  Administered 2021-03-09: 100 meq via INTRAVENOUS
  Filled 2021-03-09: qty 50

## 2021-03-09 MED ORDER — ALBUMIN HUMAN 5 % IV SOLN: INTRAVENOUS | Status: DC | PRN

## 2021-03-09 SURGICAL SUPPLY — 99 items
APPLIER CLIP ROT 10 11.4 M/L (STAPLE) ×2
CANISTER SUCT 3000ML PPV (MISCELLANEOUS) ×4 IMPLANT
CANNULA REDUC XI 12-8 STAPL (CANNULA) ×4
CANNULA REDUCER 12-8 DVNC XI (CANNULA) ×2 IMPLANT
CLIP APPLIE ROT 10 11.4 M/L (STAPLE) ×1 IMPLANT
CLIP TI WIDE RED SMALL 24 (CLIP) ×2 IMPLANT
CNTNR URN SCR LID CUP LEK RST (MISCELLANEOUS) ×17 IMPLANT
CONN ST 1/4X3/8  BEN (MISCELLANEOUS)
CONN ST 1/4X3/8 BEN (MISCELLANEOUS) IMPLANT
CONT SPEC 4OZ STRL OR WHT (MISCELLANEOUS) ×34
DEFOGGER SCOPE WARMER CLEARIFY (MISCELLANEOUS) ×2 IMPLANT
DERMABOND ADVANCED (GAUZE/BANDAGES/DRESSINGS) ×1
DERMABOND ADVANCED .7 DNX12 (GAUZE/BANDAGES/DRESSINGS) ×1 IMPLANT
DRAIN CHANNEL 28F RND 3/8 FF (WOUND CARE) IMPLANT
DRAIN CHANNEL 32F RND 10.7 FF (WOUND CARE) IMPLANT
DRAPE ARM DVNC X/XI (DISPOSABLE) ×4 IMPLANT
DRAPE COLUMN DVNC XI (DISPOSABLE) ×1 IMPLANT
DRAPE CV SPLIT W-CLR ANES SCRN (DRAPES) ×2 IMPLANT
DRAPE DA VINCI XI ARM (DISPOSABLE) ×8
DRAPE DA VINCI XI COLUMN (DISPOSABLE) ×2
DRAPE HALF SHEET 40X57 (DRAPES) ×2 IMPLANT
DRAPE ORTHO SPLIT 77X108 STRL (DRAPES) ×2
DRAPE SURG ORHT 6 SPLT 77X108 (DRAPES) ×1 IMPLANT
ELECT BLADE 6.5 EXT (BLADE) IMPLANT
ELECT REM PT RETURN 9FT ADLT (ELECTROSURGICAL) ×2
ELECTRODE REM PT RTRN 9FT ADLT (ELECTROSURGICAL) ×1 IMPLANT
FELT TEFLON 1X6 (MISCELLANEOUS) ×2 IMPLANT
GAUZE KITTNER 4X5 RF (MISCELLANEOUS) ×8 IMPLANT
GAUZE SPONGE 4X4 12PLY STRL (GAUZE/BANDAGES/DRESSINGS) ×2 IMPLANT
GLOVE TRIUMPH SURG SIZE 7.5 (KITS) ×4 IMPLANT
GOWN STRL REUS W/ TWL LRG LVL3 (GOWN DISPOSABLE) ×2 IMPLANT
GOWN STRL REUS W/ TWL XL LVL3 (GOWN DISPOSABLE) ×2 IMPLANT
GOWN STRL REUS W/TWL 2XL LVL3 (GOWN DISPOSABLE) ×2 IMPLANT
GOWN STRL REUS W/TWL LRG LVL3 (GOWN DISPOSABLE) ×4
GOWN STRL REUS W/TWL XL LVL3 (GOWN DISPOSABLE) ×4
HEMOSTAT SURGICEL 2X14 (HEMOSTASIS) ×4 IMPLANT
IRRIGATION STRYKERFLOW (MISCELLANEOUS) ×1 IMPLANT
IRRIGATOR STRYKERFLOW (MISCELLANEOUS) ×2
KIT BASIN OR (CUSTOM PROCEDURE TRAY) ×2 IMPLANT
KIT TURNOVER KIT B (KITS) ×2 IMPLANT
NEEDLE HYPO 25GX1X1/2 BEV (NEEDLE) ×2 IMPLANT
NEEDLE SPNL 22GX3.5 QUINCKE BK (NEEDLE) ×2 IMPLANT
NS IRRIG 1000ML POUR BTL (IV SOLUTION) ×2 IMPLANT
PACK CHEST (CUSTOM PROCEDURE TRAY) ×2 IMPLANT
PAD ARMBOARD 7.5X6 YLW CONV (MISCELLANEOUS) ×4 IMPLANT
PENCIL BUTTON HOLSTER BLD 10FT (ELECTRODE) ×2 IMPLANT
PORT ACCESS TROCAR AIRSEAL 12 (TROCAR) ×1 IMPLANT
PORT ACCESS TROCAR AIRSEAL 5M (TROCAR) ×1
RELOAD STAPLER 2.5X45 WHT DVNC (STAPLE) ×4 IMPLANT
RELOAD STAPLER 3.5X45 BLU DVNC (STAPLE) ×3 IMPLANT
RELOAD STAPLER 4.3X45 GRN DVNC (STAPLE) ×5 IMPLANT
RELOAD STAPLER 45 4.6 BLK DVNC (STAPLE) ×4 IMPLANT
SEAL CANN UNIV 5-8 DVNC XI (MISCELLANEOUS) ×2 IMPLANT
SEAL XI 5MM-8MM UNIVERSAL (MISCELLANEOUS) ×4
SEALANT PATCH FIBRIN 2X4IN (MISCELLANEOUS) ×2 IMPLANT
SEALANT PROGEL (MISCELLANEOUS) IMPLANT
SEALER SYNCHRO 8 IS4000 DV (MISCELLANEOUS) ×2
SEALER SYNCHRO 8 IS4000 DVNC (MISCELLANEOUS) ×1 IMPLANT
SET TRI-LUMEN FLTR TB AIRSEAL (TUBING) ×2 IMPLANT
SOLUTION ELECTROLUBE (MISCELLANEOUS) ×2 IMPLANT
SPONGE T-LAP 18X18 ~~LOC~~+RFID (SPONGE) ×4 IMPLANT
STAPLER 45 DA VINCI SURE FORM (STAPLE) ×2
STAPLER 45 SUREFORM CVD (STAPLE) ×2
STAPLER 45 SUREFORM CVD DVNC (STAPLE) ×1 IMPLANT
STAPLER 45 SUREFORM DVNC (STAPLE) ×1 IMPLANT
STAPLER CANNULA SEAL DVNC XI (STAPLE) ×2 IMPLANT
STAPLER CANNULA SEAL XI (STAPLE) ×4
STAPLER RELOAD 2.5X45 WHITE (STAPLE) ×8
STAPLER RELOAD 2.5X45 WHT DVNC (STAPLE) ×4
STAPLER RELOAD 3.5X45 BLU DVNC (STAPLE) ×3
STAPLER RELOAD 3.5X45 BLUE (STAPLE) ×6
STAPLER RELOAD 4.3X45 GREEN (STAPLE) ×10
STAPLER RELOAD 4.3X45 GRN DVNC (STAPLE) ×5
STAPLER RELOAD 45 4.6 BLK (STAPLE) ×8
STAPLER RELOAD 45 4.6 BLK DVNC (STAPLE) ×4
SUT PDS AB 3-0 SH 27 (SUTURE) IMPLANT
SUT PROLENE 3 0 SH DA (SUTURE) ×2 IMPLANT
SUT PROLENE 4 0 RB 1 (SUTURE) ×12
SUT PROLENE 4-0 RB1 .5 CRCL 36 (SUTURE) ×6 IMPLANT
SUT SILK  1 MH (SUTURE) ×4
SUT SILK 1 MH (SUTURE) ×2 IMPLANT
SUT SILK 2 0SH CR/8 30 (SUTURE) ×2 IMPLANT
SUT VIC AB 1 CTX 36 (SUTURE) ×2
SUT VIC AB 1 CTX36XBRD ANBCTR (SUTURE) ×1 IMPLANT
SUT VIC AB 2-0 CTX 36 (SUTURE) ×2 IMPLANT
SUT VIC AB 3-0 MH 27 (SUTURE) IMPLANT
SUT VIC AB 3-0 X1 27 (SUTURE) ×10 IMPLANT
SUT VICRYL 0 TIES 12 18 (SUTURE) ×2 IMPLANT
SUT VICRYL 0 UR6 27IN ABS (SUTURE) ×4 IMPLANT
SUT VICRYL 2 TP 1 (SUTURE) ×2 IMPLANT
SYR 20ML LL LF (SYRINGE) ×4 IMPLANT
SYSTEM RETRIEVAL ANCHOR 12 (MISCELLANEOUS) ×2 IMPLANT
SYSTEM SAHARA CHEST DRAIN ATS (WOUND CARE) ×2 IMPLANT
TAPE CLOTH 4X10 WHT NS (GAUZE/BANDAGES/DRESSINGS) ×2 IMPLANT
TIP APPLICATOR SPRAY EXTEND 16 (VASCULAR PRODUCTS) IMPLANT
TOWEL GREEN STERILE (TOWEL DISPOSABLE) ×4 IMPLANT
TRAY FOLEY MTR SLVR 14FR STAT (SET/KITS/TRAYS/PACK) ×2 IMPLANT
TROCAR XCEL BLADELESS 5X75MML (TROCAR) IMPLANT
WATER STERILE IRR 1000ML POUR (IV SOLUTION) ×2 IMPLANT

## 2021-03-09 NOTE — Brief Op Note (Signed)
03/09/2021  12:58 PM  PATIENT:  [name]  80 y.o. female  PRE-OPERATIVE DIAGNOSIS:  LEFT UPPER LOBE NODULE  POST-OPERATIVE DIAGNOSIS:  LEFT UPPER LOBE NODULE  PROCEDURE:  Procedure(s): XI ROBOTIC ASSISTED THORASCOPY-LEFT UPPER LOBE SEGMENTECTOMY (Left) INTERCOSTAL NERVE BLOCK (Left) LYMPH NODE DISSECTION (Left) THORACOTOMY MAJOR (Left)  SURGEON:  Surgeon(s) and Role:    * [name], MD - Primary  PHYSICIAN ASSISTANT: Taiana Temkin PA-C   ANESTHESIA:   general  EBL:  1000 mL   BLOOD ADMINISTERED: 4 UNITS PRBC'S  DRAINS: (42 F) Blake drain(s) in the LEFT HEMITHORAX    LOCAL MEDICATIONS USED:  EXPAREL  SPECIMEN:  Source of Specimen:  LEFT UPPER LOBE TRI-SEGMENTECTOMY, MULTIPLE LN SAMPLES  DISPOSITION OF SPECIMEN:  PATHOLOGY  COUNTS:  YES  TOURNIQUET:  * No tourniquets in log *  DICTATION: .Other Dictation: Dictation Number [id number]  PLAN OF CARE: Admit to inpatient   PATIENT DISPOSITION:  PACU - hemodynamically stable.   Delay start of Pharmacological VTE agent (>24hrs) due to surgical blood loss or risk of bleeding: no  COMPLICATIONS: PULMONARY ARTERY INJURY

## 2021-03-09 NOTE — Progress Notes (Signed)
      [address]       [place] [address]             [phone]      S/p LUL segmentectomy complicated by bleeding requiring thoracotomy Intubated, sedated  BP (!) 165/72   Pulse (!) 123   Temp (!) 95 F (35 C)   Resp (!) 25   Ht 5\' 5"  (1.651 m)   Wt 61.2 kg   SpO2 97%   BMI 22.47 kg/m  On levophed at 20, neo at 75 currently Pupils equal and reactive  Coagulopathy, thrombocytopenia and metabolic acidosis being corrected  CT output relatively high.   CXR OK  Will repeat labs after platelets, FFP and current unit of PRBC given  May need to return to OR if bleeding persists  Remo Lipps C. Roxan Hockey, MD [facility] [phone]

## 2021-03-09 NOTE — Discharge Summary (Addendum)
Physician Discharge Summary       [address]       [place] [address]             [phone]    Patient ID: [name] MRN: [id number] DOB/AGE: May 05, 1941 80 y.o.  Admit date: 03/09/2021 Discharge date: 03/29/2021  Admission Diagnoses: Left upper lobe lung mass  Discharge Diagnoses:  Active Problems: Metastatic endometrial adenocarcinoma 2. S/p Xi robotic assisted LUL segmentectomy, LN dissection, intercostal nerve block, followed by left thoroacotomy for control of bleeding. 3. Atrial fibrillation with RVR  Patient Active Problem List   Diagnosis Date Noted   Lung cancer ([facility]) 03/10/2021   S/p Xi robotic assisted LUL segmentectomy, LN dissection, intercostal nerve block, followed by left thoroacotomy for control of bleeding. 03/09/2021   endometrioid endometrial    Endometrial cancer ([facility]) 10/02/2016   Endometrial adenocarcinoma ([place]) 09/10/2016   Essential hypertension    Acute pancreatitis 07/08/2016   Secondary diabetes mellitus ([facility]) 07/08/2016   History of partial pancreatectomy 07/08/2016   History of malignant gastrointestinal stromal tumor (GIST) 07/08/2016   Hyperlipidemia 05/14/2016   Exocrine pancreatic insufficiency 06/04/2011   Chest pain 11/20/2010   GIST, malignant T2N0 11/20/2010   Abdominal pain, epigastric 11/20/2010    Consults: pulmonary/intensive care  Procedure (s):  Xi robotic-assisted left upper lobe segmentectomy (anterior and apical posterior), lymph node dissection, intercostal nerve blocks levels 3 through 10, left thoracotomy for control of bleeding by Dr. Roxan Hockey on 03/09/2021.  Pathology:  FINAL MICROSCOPIC DIAGNOSIS:   A. LUNG, LEFT UPPER LOBE, TRISEGMENTECTOMY:  - Metastatic adenocarcinoma, consistent with patient's clinical history  of primary endometrial adenocarcinoma.  See comment  - Margins are negative for carcinoma   B. LUNG, LEFT LOWER LOBE, WEDGE RESECTION:  - Benign lung parenchyma,  negative for carcinoma   C. LYMPH NODE, LEVEL 9, EXCISION:  - Lymph node, negative for carcinoma (0/1)   D. LYMPH NODE, LEVEL 8, EXCISION:  - Lymph node, negative for carcinoma (0/1)   E. LYMPH NODE, LEVEL 7, EXCISION:  - Lymph node, negative for carcinoma (0/1)   F. LYMPH NODE, LEVEL 7 #2, EXCISION:  - Lymph node, negative for carcinoma (0/1)   G. LYMPH NODE, LEVEL 12, EXCISION:  - Lymph node, negative for carcinoma (0/1)   H. LYMPH NODE, LEVEL 11, EXCISION:  - Lymph node, negative for carcinoma (0/1)   I. LYMPH NODE, LEVEL 13, EXCISION:  - Lymph node, negative for carcinoma (0/1)   J. LYMPH NODE, LEVEL 13 #2, EXCISION:  - Lymph node, negative for carcinoma (0/1)   K. LYMPH NODE, LEVEL 10, EXCISION:  - Lymph node, negative for carcinoma (0/1)   L. LYMPH NODE, LEVEL 10 #2, EXCISION:  - Lymph node, negative for carcinoma (0/1)   M. LYMPH NODE, LEVEL 13 #3, EXCISION:  - Lymph node, negative for carcinoma (0/1)   N. LYMPH NODE, LEVEL 12 #2, EXCISION:  - Lymph node, negative for carcinoma (0/1)   O. LYMPH NODE, LEVEL 13 #4, EXCISION:  - Lymph node, negative for carcinoma (0/1)   Immunohistochemical stains show that the tumor cells are positive  for PAX8 and ER; while they are negative for p63, CK5/6 and TTF-1,  consistent with above interpretation.  HPI: [name] is sent for consultation regarding a left upper lobe lung nodule.  [name] is a 80 year old woman with a history of endometrial cancer, adenocarcinoma the vaginal cuff, DVT after surgery, malignant gastrointestinal stromal tumor, Whipple procedure, type 2 diabetes, multiple  basal cell carcinomas, coronary atherosclerosis, hypertension, hyperlipidemia, iron deficiency anemia, arthritis, and reflux.  She had a hysterectomy in 2018 for endometrial cancer.  That was complicated by a DVT in her right leg.  More recently she was found to have a recurrence of the vaginal cuff.  That was treated with  radiation.  She had a PET/CT in June which showed a new 9 x 12 millimeter left upper lobe lung nodule, which was hypermetabolic with an SUV of 5.  There was no evidence of a lesion in that area on her prior PET from January.  She was in [place] visiting her sister and recently returned to [place].  She had a CT of the chest which showed the nodule had increased in size.  She is a lifelong non-smoker.  She denied having any fevers or chills.  She says she will occasionally have a night sweat about once every 2 months.  She is not having any cough, wheezing, or shortness of breath.  Denies chest pain, pressure, or tightness.  Her appetite is fair.  She has not had any recent weight loss although she is lost about 40 pounds over the past couple of years.   After reviewing the patient and all relevant studies it was Dr. Leonarda Salon recommendation that she be admitted for elective resection.  Hospital course:  The patient was admitted electively on 03/09/2021 taken to the operating room at which time she underwent a left upper lobe trisegmentectomy.  She did have an intraoperative complication of pulmonary artery injury necessitating transition from robotic to open thoracotomy.  She required transfusion of 4 units of packed red blood cells.  Bleeding was controlled and she was stabilized and transferred to the surgical intensive care unit in stable condition. Initial pathology was positive for malignancy with negative margins.  Critical care medicine consultation was obtained to assist with management.  She was found to have a significant postoperative coagulopathy and received additional blood products.  She was kept on the ventilator overnight and additionally required some inotropic support including norepinephrine.  The ventilator will be weaned per the CCM service over time based on clinical progression.  Her anemia has stabilized and chest tube drainage is decreasing over time.  She is noted to be  neurologically intact on postoperative day #1. She was extubated on 03/10/2021.  She was weaned off inotropic support by 03/11/2021.  She was hypertensive and her Lopressor was increased to her home dose.  There was no evidence of air leak and her chest tube was transitioned to water seal.  She was restarted on home regimen of Jardiance for additional CBG control.  She developed difficulty swallowing and SLP evaluation was obtained.  There were no signs of dysphagia under observation. Per patient, dysphagia continued to improve. She was felt medically stable for transfer to the progressive care unit on 03/13/2021. Her platelet count decreased to 45,000 on 03/14/2021 but she had no active signs of bleeding. Gradually, her platelet count increased. It was up to 130,000 on 10/20. She went into a fib with RVR on 10/19. She was placed on an Amiodarone drip. She converted to sinus rhythm and was transitioned to oral Amiodarone. Chest tube output gradually decreased. Chest tube was removed on 03/17/2021.  Follow up CXR showed increase in left pleural effusion with worsening aeration.  Trace left apical pneumothorax remained stable.  She was hyponatremic which slowly resolved without intervetion.  Her hemoglobin level remained stable at 9.8. Her chest x ray worsened in regards  to aeration;although clinically she was not hypoxic and was stable. Her WBC increased to 17,000 but she remained afebrile. CXR on 10/23 showed decrease in volume of left pleural effusion with improved aeration to the left upper lobe and left midlung. These chnges were carefully reviewed by her surgeon and ffelt to be stable not requiring any intervention. WBC on 10/24 was down to 13,300 and the hematocrit was trending up. She was started on low dose Eliquis for chronic Atrial Fibrillation. Due to her prolonged hospitalization after major surgrery she became significantly deconditioned.  She was evaluated by the PT and OT staff and was felt to be  appropriate for inpatinet rehab. Unfortunately her insurance would not authorize this, and felt SNF was indicated.  As her progress and mobility improved, physical therapy and Occupational Therapy upgrading the recommendations.  Discharge to home with PT and home OT.  The services were arranged.   Latest Vital Signs: Blood pressure (!) 122/49, pulse (!) 55, temperature 97.9 F (36.6 C), temperature source Oral, resp. rate 19, height 5\' 5"  (1.651 m), weight 57.3 kg, SpO2 96 %.  Physical Exam:  General appearance: alert, cooperative, and no distress Heart: regular rate and rhythm Lungs: breath sounds are clear, good sats on RA. Abdomen: soft, non-tender Extremities:  no edema Wound: clean and dry, ecchymosis left flank gradually clearing  Discharge Condition:Stable   Recent laboratory studies:  Lab Results  Component Value Date   WBC 13.3 (H) 03/20/2021   HGB 9.1 (L) 03/20/2021   HCT 29.4 (L) 03/20/2021   MCV 94.8 03/20/2021   PLT 299 03/20/2021   Lab Results  Component Value Date   NA 134 (L) 03/24/2021   K 3.8 03/24/2021   CL 100 03/24/2021   CO2 27 03/24/2021   CREATININE 0.86 03/24/2021   GLUCOSE 232 (H) 03/24/2021      Diagnostic Studies: DG Chest 1 View  Result Date: 03/09/2021 CLINICAL DATA:  Respiratory failure post thoracotomy EXAM: CHEST  1 VIEW COMPARISON:  03/07/2021 FINDINGS: Endotracheal tube seen 3.1 cm above the carina. Nasogastric tube is seen below the left hemidiaphragm with its tip in the expected gastric fundus. Right internal jugular central venous catheter tip within the superior vena cava. Left upper lobectomy and resultant left-sided volume loss noted. Left chest tube in place. Tiny left apical pneumothorax. No pleural effusion. Right lung is clear. Cardiac size within normal limits. Pulmonary vascularity is normal. IMPRESSION: Support lines and tubes in appropriate position. Status post left upper lobectomy. Left chest tube in place. Tiny left apical  pneumothorax. Electronically Signed   By: Fidela Salisbury M.D.   On: 03/09/2021 19:17   DG Chest 2 View  Result Date: 03/24/2021 CLINICAL DATA:  Status post partial left pneumonectomy EXAM: CHEST - 2 VIEW COMPARISON:  Previous studies including the examination of 03/20/2021 FINDINGS: Transverse diameter of heart is increased. Right lung is clear. There is increased density in left lower lung fields which may suggest pleural effusion and possibly underlying infiltrate/atelectasis. Surgical staples are seen in the left upper lung field. There is small residual left apical pneumothorax with no significant change. There is interval removal of right jugular central venous catheter. IMPRESSION: Postsurgical changes are noted in left lung. Small residual left apical pneumothorax has not changed significantly. Increased density in left lower lung field and left apical region may suggest pleural effusion and possibly underlying infiltrates with no significant interval change. Reading location: [place], [place]. Electronically Signed   By: Prudy Feeler.D.  On: 03/24/2021 08:13   DG Chest 2 View  Result Date: 03/20/2021 CLINICAL DATA:  Post LEFT thoracotomy. History lung cancer, coronary artery disease, diabetes mellitus, hypertension EXAM: CHEST - 2 VIEW COMPARISON:  Portable exam 0650 hours compared to 03/19/2021 FINDINGS: RIGHT jugular line with tip projecting over SVC. Question enlargement of cardiac silhouette. Mediastinal contours and pulmonary vascularity normal. LEFT pleural effusion and basilar atelectasis with postsurgical changes in LEFT upper lobe. Minimal LEFT apex pneumothorax unchanged since 03/17/2021. RIGHT lung clear, overlying skin folds noted. Bones demineralized with mildly displaced fractures of the LEFT sixth rib noted. IMPRESSION: Postsurgical changes of the LEFT hemithorax with persistent pleural effusion and basilar atelectasis as well as stable minimal LEFT apex pneumothorax.  Electronically Signed   By: Lavonia Dana M.D.   On: 03/20/2021 08:35   DG Chest 2 View  Result Date: 03/08/2021 CLINICAL DATA:  Preop for  left lung surgery.  Diabetic.  Nonsmoker. EXAM: CHEST - 2 VIEW COMPARISON:  Chest CT 06/15/2020 FINDINGS: Midline trachea. Normal heart size. Atherosclerosis in the transverse aorta. No pleural effusion or pneumothorax. Left upper lobe lung nodule, as detailed on prior exams. No lobar consolidation. IMPRESSION: Left upper lobe lung nodule, as before. No acute superimposed process. Aortic Atherosclerosis (ICD10-I70.0). Electronically Signed   By: Abigail Miyamoto M.D.   On: 03/08/2021 09:56   DG Chest 1V REPEAT Same Day  Result Date: 03/16/2021 CLINICAL DATA:  Chest tube removal. EXAM: CHEST - 1 VIEW SAME DAY COMPARISON:  Same day. FINDINGS: Stable small left apical pneumothorax is noted status post chest tube removal. Postoperative changes noted in the left lung. Left basilar atelectasis is noted with associated pleural effusion. IMPRESSION: Stable small left apical pneumothorax is noted status post chest tube removal. Electronically Signed   By: Marijo Conception M.D.   On: 03/16/2021 13:58   DG CHEST PORT 1 VIEW  Result Date: 03/19/2021 CLINICAL DATA:  Evaluate pleural effusion. EXAM: PORTABLE CHEST 1 VIEW COMPARISON:  03/18/2021 FINDINGS: Right IJ catheter tip is in the projection of the SVC. Cardiac silhouette is difficult to assess. Asymmetric elevation of left hemidiaphragm is again noted. Left pleural effusion appears decreased in volume from the previous exam and there is improved aeration to the left upper lobe and left midlung. Right lung is clear. IMPRESSION: Decrease in volume of left pleural effusion with improved aeration to the left upper lobe and left midlung. Electronically Signed   By: Kerby Moors M.D.   On: 03/19/2021 08:35   DG CHEST PORT 1 VIEW  Result Date: 03/18/2021 CLINICAL DATA:  Short of breath EXAM: PORTABLE CHEST 1 VIEW COMPARISON:   03/17/2021 FINDINGS: There is a right IJ catheter with tip in the projection of the SVC. Cardiomediastinal silhouette is poorly assessed. Progressive worsening aeration to the left lung with moderate persistent left pleural effusion. Small right effusion appears unchanged. No right lung opacities. Visualized osseous structures are unremarkable. IMPRESSION: 1. Progressive worsening aeration to the left lung. 2. Persistent bilateral pleural effusions, left greater than right. Electronically Signed   By: Kerby Moors M.D.   On: 03/18/2021 09:38   DG CHEST PORT 1 VIEW  Result Date: 03/17/2021 CLINICAL DATA:  Chest tube removed yesterday, sore chest EXAM: PORTABLE CHEST 1 VIEW COMPARISON:  Chest radiograph 1 day prior FINDINGS: The right IJ vascular catheter is in stable position. The cardiomediastinal silhouette is not well assessed. Aeration of the left lung is significantly worsened since the radiograph from 1 day prior, with significant interval enlargement  of a left pleural effusion. A small left apical pneumothorax is again seen. There is a small right pleural effusion. The right lung is otherwise clear. There is no right pneumothorax. The bones are stable. IMPRESSION: 1. Significant interval worsening in aeration of the left lung with enlargement of the left pleural effusion compared to the study from 1 day prior. 2. Stable small left apical pneumothorax. 3. Small right pleural effusion, not significantly changed. Electronically Signed   By: Valetta Mole M.D.   On: 03/17/2021 08:49   DG CHEST PORT 1 VIEW  Result Date: 03/16/2021 CLINICAL DATA:  Chest tube present. EXAM: PORTABLE CHEST 1 VIEW COMPARISON:  March 15, 2021. FINDINGS: Similar small left apical pneumothorax with similar position of a left chest tube. Similar moderate left and small right pleural effusions. Similar left lung opacities with postoperative change. Similar right IJ approach central venous catheter with the tip projecting at the  superior vena cava. IMPRESSION: 1. Similar small left apical pneumothorax with similar position of a left chest tube. 2. Similar moderate left and small right pleural effusions. 3. Similar nonspecific left lung opacities with postoperative change. Electronically Signed   By: Margaretha Sheffield M.D.   On: 03/16/2021 09:33   DG CHEST PORT 1 VIEW  Result Date: 03/15/2021 CLINICAL DATA:  Chest tube present ,post lung surg,,PTX, sob EXAM: PORTABLE CHEST - 1 VIEW COMPARISON:  the previous day's study FINDINGS: Right IJ central line and left chest tube remain in place. Small left apical pneumothorax persists. Postop changes at the left hilum stable. Consolidation/atelectasis at the left lung base. Stable small right pleural effusion with adjacent consolidation/atelectasis. Heart size stable. Visualized bones unremarkable. IMPRESSION: 1. Left chest tube with persistent apical pneumothorax. 2. Small right pleural effusion stable. Electronically Signed   By: Lucrezia Europe M.D.   On: 03/15/2021 06:57   DG Chest Port 1 View  Result Date: 03/14/2021 CLINICAL DATA:  Follow-up lobectomy EXAM: PORTABLE CHEST 1 VIEW COMPARISON:  03/12/2021 FINDINGS: Left chest tube remains in place. Pleural air at the left apex appears similar, not increased. Right internal jugular central line tip in the SVC above the right atrium. Some persistent volume loss/infiltrate in the remaining left lung. Persistent right effusion and right base atelectasis. IMPRESSION: Persistent small left apical pneumothorax following left lobectomy. Persistent volume loss/infiltrate in the remaining left lung. Persistent right effusion with right base volume loss. Electronically Signed   By: Nelson Chimes M.D.   On: 03/14/2021 08:41   DG CHEST PORT 1 VIEW  Result Date: 03/12/2021 CLINICAL DATA:  Chest tube. EXAM: PORTABLE CHEST 1 VIEW COMPARISON:  Chest x-rays dated 03/11/2021 and 03/10/2021. FINDINGS: Stable small LEFT-sided pneumothorax. LEFT-sided chest  tube is stable in position. Ill-defined airspace opacities within the LEFT mid and lower lung zones are stable. Increased opacity at the RIGHT lung base, likely atelectasis and/or small pleural effusion. Heart size and mediastinal contours are stable. RIGHT IJ central line is stable in position with tip at the level of the mid SVC. IMPRESSION: 1. Stable small LEFT-sided pneumothorax. LEFT-sided chest tube is stable in position. 2. Increased opacity at the RIGHT lung base, likely atelectasis and/or small pleural effusion. 3. Stable ill-defined airspace opacities within the LEFT mid and lower lung zones, likely atelectasis and/or edema. Electronically Signed   By: Franki Cabot M.D.   On: 03/12/2021 07:15   DG CHEST PORT 1 VIEW  Result Date: 03/11/2021 CLINICAL DATA:  Follow-up chest tube EXAM: PORTABLE CHEST 1 VIEW COMPARISON:  Films from  earlier in the same day. FINDINGS: Right jugular central line is again seen and stable. Left-sided chest tube is seen with small residual left pneumothorax in the apex. This has improved in the interval from the prior exam. Patchy airspace opacity is noted throughout the left lung stable from the prior study. Minimal atelectatic changes are noted in the right base. No bony abnormality is seen. IMPRESSION: Improving but persistent left pneumothorax with chest tube in place. Postsurgical changes are seen. Persistent airspace opacity within the left lung with new right basilar atelectasis. Electronically Signed   By: Inez Catalina M.D.   On: 03/11/2021 21:00   DG CHEST PORT 1 VIEW  Result Date: 03/11/2021 CLINICAL DATA:  Postsurgical follow-up.  Shortness of breath. EXAM: PORTABLE CHEST 1 VIEW COMPARISON:  Chest x-rays dated 03/10/2021 and 03/09/2021. FINDINGS: Increased size of the LEFT-sided pneumothorax, now moderate in size. LEFT-sided chest tube in place. Persistent opacity at the LEFT lung base, compatible with atelectasis and/or pleural effusion. Increased LEFT  perihilar edema. Small RIGHT pleural effusion. RIGHT IJ central line is stable in position with tip at the level of the lower SVC/cavoatrial junction. Endotracheal tube and enteric tube has been removed. IMPRESSION: 1. Increased LEFT-sided pneumothorax, now moderate in size. LEFT-sided chest tube in place. 2. Persistent opacity at the LEFT lung base, compatible with atelectasis and/or pleural effusion. Increased LEFT perihilar edema. 3. Small RIGHT pleural effusion. Electronically Signed   By: Franki Cabot M.D.   On: 03/11/2021 05:53   DG Chest Port 1 View  Result Date: 03/10/2021 CLINICAL DATA:  Postop lung surgery. EXAM: PORTABLE CHEST 1 VIEW COMPARISON:  03/09/2021 FINDINGS: Postop left upper lobectomy. Left chest tube in place. Probable small left apical pneumothorax. Progressive airspace disease in the left upper lobe and left lower lobe. Small left effusion. Elevated left hemidiaphragm Mild right lower lobe atelectasis Endotracheal tube in good position. Central venous catheter tip in the SVC. NG tube enters the stomach. IMPRESSION: Left chest tube in place. Probable small left apical pneumothorax. Progressive left upper lobe and left lower lobe atelectasis/infiltrate. Small left effusion. Electronically Signed   By: Franchot Gallo M.D.   On: 03/10/2021 09:16         Discharge Medications: Allergies as of 03/29/2021       Reactions   Ibuprofen Hives        Medication List     TAKE these medications    Align 4 MG Caps Take 4 mg by mouth daily.   amiodarone 200 MG tablet Commonly known as: PACERONE Take 1 tablet (200 mg total) by mouth daily.   apixaban 2.5 MG Tabs tablet Commonly known as: ELIQUIS Take 1 tablet (2.5 mg total) by mouth 2 (two) times daily.   aspirin 81 MG chewable tablet Chew 1 tablet by mouth daily.   CoQ-10 100 MG Caps Take 100 mg by mouth at bedtime.   Creon 12000-38000 units Cpep capsule Generic drug: lipase/protease/amylase Take 12,000 Units by  mouth 3 (three) times daily before meals. What changed: Another medication with the same name was removed. Continue taking this medication, and follow the directions you see here.   empagliflozin 10 MG Tabs tablet Commonly known as: JARDIANCE Take 10 mg by mouth daily.   Melatonin 10 MG Tabs Take 10 mg by mouth at bedtime as needed (sleep).   metoprolol tartrate 50 MG tablet Commonly known as: LOPRESSOR Take 25 mg by mouth 2 (two) times daily.   OneTouch Verio test strip Generic drug: glucose blood  daily.   PREVAGEN PO Take 1 tablet by mouth daily.   simvastatin 20 MG tablet Commonly known as: ZOCOR Take 20 mg by mouth at bedtime.   sucralfate 1 g tablet Commonly known as: CARAFATE Take 1 g by mouth 2 (two) times daily.   traMADol 50 MG tablet Commonly known as: ULTRAM Take 1 tablet (50 mg total) by mouth every 6 (six) hours as needed (mild pain).   Vitamin C 500 MG Caps Take 500 mg by mouth daily.   Vitamin D 50 MCG (2000 UT) tablet Take 2,000 Units by mouth daily.   vitamin E 180 MG (400 UNITS) capsule Take 400 Units by mouth daily.   zinc gluconate 50 MG tablet Take 50 mg by mouth daily.               Durable Medical Equipment  (From admission, onward)           Start     Ordered   03/28/21 1723  For home use only DME 3 n 1  Once        03/28/21 1723   03/28/21 1538  For home use only DME Other see comment  Once       Comments: Four wheels rollator with seat.  Question:  Length of Need  Answer:  Lifetime   03/28/21 1539   03/15/21 0659  For home use only DME Walker rolling  Once       Question Answer Comment  Walker: With [place] Wheels   Patient needs a walker to treat with the following condition Physical deconditioning      03/15/21 0658            Follow Up Appointments:  Follow-up Information     [name], MD Follow up on 04/18/2021.   Specialty: Cardiothoracic Surgery Why: Appointment is at 12:30, please get CXR  at 12:00 at [facility] located on first floor of our office building Contact information: [address] [place] [address] [phone]                 Signed:  Antony Odea, PA-C

## 2021-03-09 NOTE — H&P (Signed)
PCP is Carol Cha, MD Referring Provider is Carol Gibbs, NP       Chief Complaint  Patient presents with   Lung Lesion      Surgical consult, PET Scan  11/10/20, Chest CT 01/26/21      HPI: [name] is sent for consultation regarding a left upper lobe lung nodule.  [name] is a 80 year old woman with a history of endometrial cancer, adenocarcinoma the vaginal cuff, DVT after surgery, malignant gastrointestinal stromal tumor, Whipple procedure, type 2 diabetes, multiple basal cell carcinomas, coronary atherosclerosis, hypertension, hyperlipidemia, iron deficiency anemia, arthritis, and reflux.  She had a hysterectomy in 2018 for endometrial cancer.  That was complicated by a DVT in her right leg.  More recently she was found to have a recurrence of the vaginal cuff.  That was treated with radiation.  She had a PET/CT in June which showed a new 9 x 12 millimeter left upper lobe lung nodule, which was hypermetabolic with an SUV of 5.  There was no evidence of a lesion in that area on her prior PET from January.  She was in [place] visiting her sister and recently returned to [place].  She had a CT of the chest which showed the nodule had increased in size.  She is a lifelong non-smoker.  She denied having any fevers or chills.  She says she will occasionally have a night sweat about once every 2 months.  She is not having any cough, wheezing, or shortness of breath.  Denies chest pain, pressure, or tightness.  Her appetite is fair.  She has not had any recent weight loss although she is lost about 40 pounds over the past couple of years.   [name] Score: At the time of surgery this patient's most appropriate activity status/level should be described as: [x]     0    Normal activity, no symptoms []     1    Restricted in physical strenuous activity but ambulatory, able to do out light work []     2    Ambulatory and capable of self care, unable to do work activities, up  and about >50 % of waking hours                              []     3    Only limited self care, in bed greater than 50% of waking hours []     4    Completely disabled, no self care, confined to bed or chair []     5    Moribund       Past Medical History:  Diagnosis Date   Anemia     Atypical mole 05/15/2005    Left Mid Outer Back (slight to moderate)   BCC (basal cell carcinoma of skin) 11/17/1991    Right Cheek (excision)   BCC (basal cell carcinoma of skin) 05/30/1994    Left Upper Forehead (curet and 5FU)   BCC (basal cell carcinoma of skin) 05/04/1997    Right upper Forehead/hairline (curet and excision)   BCC (basal cell carcinoma of skin) 06/25/1997    Right Upper Forehead/hairline (+margin) (MOH's)   BCC (basal cell carcinoma of skin) 11/12/2006    Left Cheek (curet and excision)   BCC (basal cell carcinoma of skin) 08/15/2009    Right Lower Leg (curet 5FU)   BCE (basal cell epithelioma), leg, right     Cataract 2021  Colon polyps     Coronary artery disease 2008   Coronary atherosclerosis     Diabetes mellitus without complication ([place])      pre-diabetic   DVT of lower extremity (deep venous thrombosis) ([place]) March 2012   endometrioid endometrial     GI bleed 2009   GIST (gastrointestinal stromal tumor), malignant ([place])      T2N0   H pylori ulcer 2001   History of radiation therapy 06/21/20-07/25/20    endometrium, IMRT     Dr. Sondra Foley   History of radiation therapy 08/17/2020, 08/23/2020, 08/25/2020    vaginal brachytherapy     Dr [name]   Hypercholesteremia     Hyperlipidemia     Hypertension      per patient-on medication for preventative/has not diagnosed with HTN    Insomnia     Iron deficiency     Joint pain     Osteopenia     Reflux     SCCA (squamous cell carcinoma) of skin 05/15/2005    Left Chest (in situ)   SCCA (squamous cell carcinoma) of skin 10/24/2011    Left Side of Chest Med (in situ) (tx p bx)   SCCA (squamous cell carcinoma) of skin  01/28/2019    Right Forearm inf (Keratoacanthoma) (tx p bx)   Superficial basal cell carcinoma (BCC) 06/25/1995    Upper Forehead at scar (curet and 5FU)   Ulcer      epigastric            Past Surgical History:  Procedure Laterality Date   ABDOMINAL HYSTERECTOMY       AUGMENTATION MAMMAPLASTY       BREAST ENHANCEMENT SURGERY        since removed in 2016   [place] EXTRACTION Bilateral 2021   CHOLECYSTECTOMY       PANCREATICODUODENECTOMY   06/27/2010    whipple    ROBOTIC ASSISTED TOTAL HYSTERECTOMY WITH BILATERAL SALPINGO OOPHERECTOMY Bilateral 10/02/2016    Procedure: XI ROBOTIC ASSISTED TOTAL HYSTERECTOMY WITH BILATERAL SALPINGO OOPHORECTOMY WITH SENTINAL LYMPH NODE BIOPSY LYSIS OF ADHESIONS, EXPLORATORY LAPAROTOMY;  Surgeon: Carol Amber, MD;  Location: [facility];  Service: Gynecology;  Laterality: Bilateral;   TUBAL LIGATION       WHIPPLE PROCEDURE   2012           Family History  Problem Relation Age of Onset   Diabetes Mother     Dementia Mother     Hypertension Mother     Heart attack Father     Heart attack Brother     Cancer Brother          melanoma      Social History Social History        Tobacco Use   Smoking status: Never   Smokeless tobacco: Never  Vaping Use   Vaping Use: Never used  Substance Use Topics   Alcohol use: No   Drug use: No            Current Outpatient Medications  Medication Sig Dispense Refill   Apoaequorin (PREVAGEN PO) Take 1 tablet by mouth daily.       Ascorbic Acid (VITAMIN C) 1000 MG tablet Take 1,000 mg by mouth daily.       aspirin 81 MG chewable tablet Chew 1 tablet by mouth daily.       cholecalciferol (VITAMIN D) 1000 UNITS tablet Take  1,000 Units by mouth daily.        Coenzyme Q10 (COQ-10) 100 MG CAPS Take 100 mg by mouth daily.       empagliflozin (JARDIANCE) 10 MG TABS tablet Take 1 tablet by mouth daily.       lipase/protease/amylase 24000-76000 units CPEP Take 1  capsule (24,000 Units total) by mouth 3 (three) times daily before meals. (Patient taking differently: Take 24,000 Units by mouth 2 (two) times daily with a meal.) 270 capsule 1   Melatonin 10 MG TABS Take 10 mg by mouth at bedtime as needed.       metFORMIN (GLUCOPHAGE-XR) 500 MG 24 hr tablet Take 2 tablets by mouth at bedtime.       metoprolol tartrate (LOPRESSOR) 50 MG tablet Take 0.5 tablets by mouth 2 (two) times daily.       ONETOUCH VERIO test strip daily.       simvastatin (ZOCOR) 20 MG tablet Take 20 mg by mouth every evening.       sucralfate (CARAFATE) 1 g tablet Take 1 g by mouth 2 (two) times daily.       vitamin E 180 MG (400 UNITS) capsule Take 400 Units by mouth daily.       zinc gluconate 50 MG tablet Take 50 mg by mouth daily.        No current facility-administered medications for this visit.          Allergies  Allergen Reactions   Ibuprofen Hives      Review of Systems  Constitutional:  Negative for activity change, appetite change, fever and unexpected weight change.  HENT:  Negative for trouble swallowing and voice change.   Eyes:  Negative for visual disturbance.  Respiratory:  Negative for cough, shortness of breath and wheezing.   Cardiovascular:  Negative for chest pain and leg swelling.  Genitourinary:  Positive for frequency. Negative for difficulty urinating and dysuria.  Musculoskeletal:  Positive for arthralgias.  Skin:  Positive for rash.       Itching  Neurological:  Negative for seizures, syncope and weakness.  Hematological:  Negative for adenopathy. Bruises/bleeds easily.  All other systems reviewed and are negative.   BP (!) 121/53 (BP Location: Left Arm, Patient Position: Sitting)   Pulse (!) 43   Resp 20   Ht 5\' 5"  (1.651 m)   Wt 128 lb (58.1 kg)   SpO2 98% Comment: RA  BMI 21.30 kg/m  Physical Exam Vitals reviewed.  Constitutional:      General: She is not in acute distress.    Appearance: Normal appearance.  HENT:     Head:  Normocephalic and atraumatic.  Eyes:     General: No scleral icterus.    Extraocular Movements: Extraocular movements intact.  Cardiovascular:     Rate and Rhythm: Normal rate and regular rhythm.     Pulses: Normal pulses.     Heart sounds: Normal heart sounds. No murmur heard. Pulmonary:     Effort: Pulmonary effort is normal. No respiratory distress.     Breath sounds: Normal breath sounds. No wheezing or rales.  Abdominal:     General: There is no distension.     Palpations: Abdomen is soft.  Musculoskeletal:     Cervical back: Neck supple.  Lymphadenopathy:     Cervical: No cervical adenopathy.  Skin:    General: Skin is warm and dry.  Neurological:     General: No focal deficit present.     Mental Status:  She is alert and oriented to person, place, and time.     Cranial Nerves: No cranial nerve deficit.     Motor: No weakness.        Diagnostic Tests: NUCLEAR MEDICINE PET SKULL BASE TO THIGH   TECHNIQUE: 6.7 mCi F-18 FDG was injected intravenously. Full-ring PET imaging was performed from the skull base to thigh after the radiotracer. CT data was obtained and used for attenuation correction and anatomic localization.   Fasting blood glucose: 140 mg/dl   COMPARISON:  PET-CT dated 05/31/2020   FINDINGS: Mediastinal blood pool activity: SUV max 2.4   Liver activity: SUV max NA   NECK: No hypermetabolic cervical lymphadenopathy.   Incidental CT findings: none   CHEST: 9 x 12 mm posterior left upper lobe nodule (series 8/image 24), new, max SUV 5.0.   No hypermetabolic thoracic lymphadenopathy.   Incidental CT findings: Atherosclerotic calcifications of the aortic arch. Three vessel coronary atherosclerosis.   ABDOMEN/PELVIS: Status post hysterectomy. No residual hypermetabolism along the vagina/vaginal cuff.   No hypermetabolic abdominopelvic lymphadenopathy.   No abnormal hypermetabolism in the liver, spleen, pancreas, or adrenal glands.    Incidental CT findings: Hepatic steatosis. Atherosclerotic calcifications the abdominal aorta and branch vessels.   SKELETON: No focal hypermetabolic activity to suggest skeletal metastasis.   Incidental CT findings: Degenerative changes of the visualized thoracolumbar spine.   IMPRESSION: Status post hysterectomy. No residual hypermetabolism along the vagina/vaginal cuff.   New 12 mm posterior left upper lobe pulmonary nodule/metastasis.     Electronically Signed   By: Julian Hy M.D.   On: 11/11/2020 09:01 CT CHEST WITH CONTRAST   TECHNIQUE: Multidetector CT imaging of the chest was performed during intravenous contrast administration.   CONTRAST:  28mL OMNIPAQUE IOHEXOL 350 MG/ML SOLN   COMPARISON:  PET-CT 11/10/2020.   FINDINGS: Cardiovascular: Heart size is normal. There is no significant pericardial fluid, thickening or pericardial calcification. There is aortic atherosclerosis, as well as atherosclerosis of the great vessels of the mediastinum and the coronary arteries, including calcified atherosclerotic plaque in the left main, left anterior descending, left circumflex and right coronary arteries.   Mediastinum/Nodes: No pathologically enlarged mediastinal or hilar lymph nodes. Esophagus is unremarkable in appearance. No axillary lymphadenopathy.   Lungs/Pleura: The nodule of concern on the prior PET-CT has increased in size, currently measuring 1.7 x 1.2 x 1.2 cm (axial image 57 of series 5 and sagittal image 133 of series 7) as compared with 9 x 12 mm on the prior PET-CT. This nodule has macrolobulated and slightly spiculated margins, with extensions to the pleura laterally. No other new suspicious appearing pulmonary nodules or masses are noted. No acute consolidative airspace disease. No pleural effusions. Some areas of mild fibrosis are noted in the medial aspect of the right lower lobe adjacent to several prominent marginal osteophytes.    Upper Abdomen: Aortic atherosclerosis. In the anterior aspect of segment 2 of the liver (axial image 134 of series 2) there is a well-defined 1.7 x 1.0 cm low-attenuation lesion, compatible with a simple cyst.   Musculoskeletal: There are no aggressive appearing lytic or blastic lesions noted in the visualized portions of the skeleton.   IMPRESSION: 1. Interval enlargement of aggressive appearing left upper lobe pulmonary nodule which currently measures 1.7 x 1.2 x 1.2 cm. This nodule was previously hypermetabolic on PET-CT. This may represent a solitary metastatic lesion, however, the possibility of a primary bronchogenic neoplasm also warrants consideration, particularly in light of the morphology of the  nodule. No new pulmonary nodules. No lymphadenopathy noted in the thorax. 2. Aortic atherosclerosis, in addition to left main and 3 vessel coronary artery disease. Assessment for potential risk factor modification, dietary therapy or pharmacologic therapy may be warranted, if clinically indicated.   Aortic Atherosclerosis (ICD10-I70.0).     Electronically Signed   By: Vinnie Langton M.D.   On: 01/28/2021 08:17 I personally reviewed the CT and PET/CT images.  There is a solitary left upper lobe lung nodule that is hypermetabolic on PET/CT.  Showed interval growth between the PET and CT images.   Impression: Obera Stauch is a 80 year old woman with a history of endometrial cancer, adenocarcinoma the vaginal cuff, DVT after surgery, malignant gastrointestinal stromal tumor, Whipple procedure, type 2 diabetes, multiple basal cell carcinomas, coronary atherosclerosis, hypertension, hyperlipidemia, iron deficiency anemia, arthritis, and reflux.   Left upper lobe lung nodule-new nodule as of June.  Lifelong non-smoker.  Most likely metastatic endometrial cancer.  Infectious and inflammatory nodules and primary bronchogenic carcinoma are also in the differential.  This would be a  highly unusual presentation for lung primary and a non-smoker.  That does not completely rule out the possibility but I think it is less likely than metastatic disease.  I do think this needs to be considered metastatic endometrial cancer unless we can prove otherwise.  We discussed the possibility of attempted bronchoscopic biopsy versus surgical resection.  Given the high probability of malignancy, I think proceeding directly to resection is the best option.  I recommended we do robotic left VATS for wedge resection and possible lobectomy.  We would only do lobectomy if radiology was certain this was a lung primary.  More than likely would do the limited resection and node sampling.  I informed her of the general nature of the procedure including the need for general anesthesia, the incisions to be used, the use of drainage tube postoperatively, the expected hospital stay, and the overall recovery.  I informed her of the indications, risks, benefits, and alternatives.  She understands the risks include, but not limited to death, MI, DVT, PE, bleeding, possible need for transfusion, infection, prolonged air leak, cardiac arrhythmias, as well as possibility of other unforeseeable complications.  Given her history of a DVT after hysterectomy she will definitely need both mechanical and pharmacologic prophylaxis.  We will also consider sending her home on anticoagulation for a brief period postoperatively.   Plan: Pulmonary function testing with and without bronchodilators Robotic left VATS for wedge resection possible left upper lobectomy.  Patient will call to schedule.   [name], MD [facility] [phone]          Electronically signed by [name], MD at 01/31/2021  2:42 PM

## 2021-03-09 NOTE — Progress Notes (Signed)
[facility]  Total chest tube output since coming to the unit is 850cc Vasopressor requirement up slightly Will add PLT, cryo, 2 more units of PRBC and FFP Placing CVL now for more access Add sodium bicarbonate infusion Increase RR Repeat ABG 1 hour Full note to follow  Roselie Awkward, MD [place] [facility] Pager: [phone] Cell: [phone] After 7:00 pm call Elink  [phone]

## 2021-03-09 NOTE — Anesthesia Procedure Notes (Signed)
Procedure Name: Intubation Date/Time: 03/09/2021 1:05 PM Performed by: Reece Agar, CRNA Pre-anesthesia Checklist: Patient identified, Emergency Drugs available, Suction available and Patient being monitored Patient Re-evaluated:Patient Re-evaluated prior to induction Oxygen Delivery Method: Circle System Utilized Preoxygenation: Pre-oxygenation with 100% oxygen Induction Type: IV induction Ventilation: Mask ventilation without difficulty Laryngoscope Size: Glidescope and 3 Grade View: Grade I Tube type: Oral Tube size: 7.0 mm Number of attempts: 1 Airway Equipment and Method: Oral airway and Bougie stylet Placement Confirmation: ETT inserted through vocal cords under direct vision, positive ETCO2 and breath sounds checked- equal and bilateral Secured at: 21 cm Tube secured with: Tape Dental Injury: Teeth and Oropharynx as per pre-operative assessment

## 2021-03-09 NOTE — Consult Note (Signed)
NAME:  [name], MRN:  [id number], DOB:  16-Aug-1940, LOS: 0 ADMISSION DATE:  03/09/2021, CONSULTATION DATE:  03/09/2021 REFERRING MD:  Dr. Roxan Hockey, CHIEF COMPLAINT:  Shock  BRIEF HISTORY:    [name] is a 80 y.o. female with a pertinent PMH of exocrine pancreatic insuffiencey, DM, HTN, HLD/CAD, endometrial adenocarcinoma s/p hysterectomy (0240) complicated by provoked DVT, who presented resection of left upper lobe hypermetabolic lung nodule 2/2 to presumed recurrence of endometrial adenocarcinoma.   HISTORY OF PRESENT ILLNESS   [name] is a 79 y.o. female with a pertinent PMH of exocrine pancreatic insuffiencey, DM, HTN, HLD/CAD, endometrial adenocarcinoma s/p hysterectomy (9735) complicated by provoked DVT, who presented VAT resection of left upper lobe hypermetabolic lung nodule 2/2 to presumed recurrence of endometrial adenocarcinoma.   Her hospital course was complicated by intraoperative hemorrhage of roughly 3 liters with hemodynamic instability and resulting PEA arrest with ROSC in roughly 3-5 minutes. The surgery was converted to an open thoracotomy to achieve hemostasis and a chest tube was placed in the left hemithorax. The chest tube has drained roughly 800 cc of sanguinous fluid since returning to the ICU for an estimated total blood loss of roughly 3.8 L. Intraoperative ABG was performed with a critically low ph of 6.89 that has since improved to 7.2 with pressors and 4 units PRBC.   SIGNIFICANT PAST MEDICAL HISTORY   exocrine pancreatic insuffiencey, DM, HTN, HLD/CAD, endometrial adenocarcinoma s/p hysterectomy (3299) complicated by provoked DVT  SIGNIFICANT EVENTS:  10/13 - Admitted for VATS of left upper lobe lung nodule.  10/13 - Rt femoral CVC placed   STUDIES:   PET Scan - Skull to Thigh: Result Date: 02/10/2021 1. Interval enlargement and increase in metabolic activity solitary LEFT upper lobe pulmonary nodule consistent with  malignancy. 2. No evidence of metastatic adenopathy in the mediastinum. 3. No evidence of local recurrence in the pelvis or metastatic adenopathy in the abdomen pelvis  CXR: Result Date: 03/07/2021 Left upper lobe lung nodule, as before. No acute superimposed process. Aortic Atherosclerosis (ICD10-I70.0).   CULTURES:  N/a  ANTIBIOTICS:  N/a  LINES/TUBES:  10/13 Arterial line - Rt radial  10/13 CVC - Rt IJ 10/13 - Lt Chest tube 10/13 - ETT - 7 mm  10/13 CVC - Rt femoral    CONSULTANTS:  PCCM  OBJECTIVE:   BP 117/71   Pulse (!) 141   Temp 97.6 F (36.4 C) (Oral)   Resp 12   Ht 5' 5"  (1.651 m)   Wt 61.2 kg   SpO2 97%   BMI 22.47 kg/m   No intake/output data recorded.     Vent Mode: PRVC FiO2 (%):  [50 %] 50 % Set Rate:  [12 bmp-25 bmp] 25 bmp Vt Set:  [450 mL] 450 mL PEEP:  [5 cmH20-10 cmH20] 10 cmH20 Pressure Support:  [10 cmH20] 10 cmH20 Plateau Pressure:  [21 cmH20] 21 cmH20  PHYSICAL EXAM: General:  ill, pale appearing female sedated with ETT in place. HEENT:  ETT in place  Cardiovascular:  Tachycardic, RRR no obvious murmurs  Lungs:  MV sounds, chest tube in the left hemithorax with ~800 cc of sanginous fluid Abdomen:  soft, nondistended Musculoskeletal:   Skin:  pale, poor cap refill, decreased distal pulses,  Neuro:  sedated  RESOLVED PROBLEM LIST   ASSESSMENT AND PLAN    Acute Hypoxic/Hypercarbic Respiratory Failure, Multifactorial: PEA Arrest Hemorraghic Shock: AGMA: Patient presents after segmentectomy of a left upper lobe nodule with resulting intraoperative hemorrhage  PEA arrest and hemorrhagic shock.  Patient is status post 4 units PRBCs on nor epi, phenylephrine, and vasopressin and maintaining maps greater than 65.  Patient is intubated and sedated with fentanyl and propofol for RASS goal of -2. we will initiate massive transfusion protocol as patient continues show signs of intrathoracic hemorrhage with roughly 800 cc of sanguinous  fluid from her left chest tube.  Patient has significant metabolic acidosis on bicarb infusion. - Massive transfusion protocol  - Will trend Hgb, Ptc, PT/INR, and fibrinogen  - PAD protocol with RAS goal of -2 - VAP prevention  - On bicarb gtt - Continue pressors to maintain MAPs > 65 - Repeat ABG and lytes   Presumed Metastatic endometrial adenocarcinoma s/p resection: - Will defer to CT surgery for further surgical management   Secondary Insulin Dependant DM: - Will need CBG monitoring - Maintain CBG between 140 and 180  - SSI    Best Practice / Goals of Care / Disposition.   DVT PROPHYLAXIS:none  SUP: PPI  NUTRITION:Npo > tube feeds MOBILITY:bed rest  GOALS OF CARE:n/a FAMILY DISCUSSIONS: pending DISPOSITION ICU  LABS  Glucose Recent Labs  Lab 03/07/21 1308 03/09/21 0635  GLUCAP 207* 147*    BMET Recent Labs  Lab 03/07/21 1433 03/09/21 1144 03/09/21 1217 03/09/21 1330 03/09/21 1450  NA 138   < > 140 139 137  K 3.3*   < > 6.3* 4.5 4.7  CL 106  --   --   --  109  CO2 24  --   --   --  17*  BUN 9  --   --   --  9  CREATININE 0.52  --   --   --  0.77  GLUCOSE 194*  --   --   --  424*   < > = values in this interval not displayed.    Liver Enzymes Recent Labs  Lab 03/07/21 1433  AST 22  ALT 14  ALKPHOS 106  BILITOT 0.9  ALBUMIN 3.2*    Electrolytes Recent Labs  Lab 03/07/21 1433 03/09/21 1450  CALCIUM 8.5* 9.3    CBC Recent Labs  Lab 03/07/21 1433 03/09/21 1144 03/09/21 1330 03/09/21 1424 03/09/21 1534  WBC 4.1  --   --   --  9.5  HGB 12.5   < > 8.2* 10.2* 9.8*  HCT 39.5   < > 24.0* 31.9* 30.2*  PLT 155  --   --  73* 51*   < > = values in this interval not displayed.    ABG Recent Labs  Lab 03/09/21 1144 03/09/21 1217 03/09/21 1330  PHART 7.335* 6.892* 7.236*  PCO2ART 40.9 87.2* 37.2  PO2ART 64* 395* 265*    Coag's Recent Labs  Lab 03/07/21 1433 03/09/21 1424 03/09/21 1534  APTT 30 60* 84*  INR 1.1 2.1* 2.8*     Sepsis Markers No results for input(s): LATICACIDVEN, PROCALCITON, O2SATVEN in the last 168 hours.  Cardiac Enzymes No results for input(s): TROPONINI, PROBNP in the last 168 hours.  PAST MEDICAL HISTORY :   She  has a past medical history of Anemia, Atypical mole (05/15/2005), BCC (basal cell carcinoma of skin) (11/17/1991), BCC (basal cell carcinoma of skin) (05/30/1994), BCC (basal cell carcinoma of skin) (05/04/1997), BCC (basal cell carcinoma of skin) (06/25/1997), BCC (basal cell carcinoma of skin) (11/12/2006), BCC (basal cell carcinoma of skin) (08/15/2009), BCE (basal cell epithelioma), leg, right, Cataract (2021), Colon polyps, Coronary artery disease (2008), Coronary atherosclerosis, Diabetes  mellitus without complication ([place]), DVT of lower extremity (deep venous thrombosis) ([place]) (07/2010), endometrioid endometrial, GI bleed (2009), GIST (gastrointestinal stromal tumor), malignant ([place]), H pylori ulcer (2001), History of radiation therapy (06/21/20-07/25/20), History of radiation therapy (08/17/2020, 08/23/2020, 08/25/2020), Hypercholesteremia, Hyperlipidemia, Hypertension, Insomnia, Iron deficiency, Joint pain, Lung cancer ([place]), Osteopenia, Reflux, SCCA (squamous cell carcinoma) of skin (05/15/2005), SCCA (squamous cell carcinoma) of skin (10/24/2011), SCCA (squamous cell carcinoma) of skin (01/28/2019), Superficial basal cell carcinoma (BCC) (06/25/1995), and Ulcer.  PAST SURGICAL HISTORY:  She  has a past surgical history that includes Breast surgery; Pancreaticoduodenectomy (06/27/2010); Tubal ligation; Breast enhancement surgery; Cholecystectomy; Robotic assisted total hysterectomy with bilateral salpingo oophorectomy (Bilateral, 10/02/2016); Augmentation mammaplasty; Cardiac catheterization; Whipple procedure (2012); Abdominal hysterectomy; Cataract extraction (Bilateral, 2021); and Mohs surgery (Left, 1994).  Allergies  Allergen Reactions   Ibuprofen Hives    No current  facility-administered medications on file prior to encounter.   Current Outpatient Medications on File Prior to Encounter  Medication Sig   Apoaequorin (PREVAGEN PO) Take 1 tablet by mouth daily.   Ascorbic Acid (VITAMIN C) 500 MG CAPS Take 500 mg by mouth daily.   aspirin 81 MG chewable tablet Chew 1 tablet by mouth daily.   Cholecalciferol (VITAMIN D) 50 MCG (2000 UT) tablet Take 2,000 Units by mouth daily.   Coenzyme Q10 (COQ-10) 100 MG CAPS Take 100 mg by mouth at bedtime.   CREON 12000-38000 units CPEP capsule Take 12,000 Units by mouth 3 (three) times daily before meals.   empagliflozin (JARDIANCE) 10 MG TABS tablet Take 10 mg by mouth daily.   Melatonin 10 MG TABS Take 10 mg by mouth at bedtime as needed (sleep).   metoprolol tartrate (LOPRESSOR) 50 MG tablet Take 25 mg by mouth 2 (two) times daily.   Probiotic Product (ALIGN) 4 MG CAPS Take 4 mg by mouth daily.   simvastatin (ZOCOR) 20 MG tablet Take 20 mg by mouth at bedtime.   sucralfate (CARAFATE) 1 g tablet Take 1 g by mouth 2 (two) times daily.   vitamin E 180 MG (400 UNITS) capsule Take 400 Units by mouth daily.   zinc gluconate 50 MG tablet Take 50 mg by mouth daily.   lipase/protease/amylase 24000-76000 units CPEP Take 1 capsule (24,000 Units total) by mouth 3 (three) times daily before meals. (Patient not taking: Reported on 03/06/2021)   ONETOUCH VERIO test strip daily.    FAMILY HISTORY:   Her family history includes Cancer in her brother; Dementia in her mother; Diabetes in her mother; Heart attack in her brother and father; Hypertension in her mother.  SOCIAL HISTORY:  She  reports that she has never smoked. She has never used smokeless tobacco. She reports that she does not drink alcohol and does not use drugs.  REVIEW OF SYSTEMS:    See above  Lawerance Cruel, D.O.  Internal Medicine Resident, PGY-3 [facility]  Pager: [phone] 5:26 PM, 03/09/2021

## 2021-03-09 NOTE — Interval H&P Note (Signed)
History and Physical Interval Note:   No interval change PFTs OK   03/09/2021 7:48 AM  [name]  has presented today for surgery, with the diagnosis of LEFT UPPER LOBE NODULE.  The various methods of treatment have been discussed with the patient and family. After consideration of risks, benefits and other options for treatment, the patient has consented to  Procedure(s): XI ROBOTIC ASSISTED THORASCOPY-LEFT UPPER LOBE WEDGE RESECTION, possible lobectomy (Left) as a surgical intervention.  The patient's history has been reviewed, patient examined, no change in status, stable for surgery.  I have reviewed the patient's chart and labs.  Questions were answered to the patient's satisfaction.     [name]

## 2021-03-09 NOTE — Transfer of Care (Signed)
Immediate Anesthesia Transfer of Care Note  Patient: [name]  Procedure(s) Performed: XI ROBOTIC ASSISTED THORASCOPY-LEFT UPPER LOBE SEGMENTECTOMY (Left: Chest) INTERCOSTAL NERVE BLOCK (Left: Chest) LYMPH NODE DISSECTION (Left: Chest) THORACOTOMY MAJOR (Left: Chest)  Patient Location: ICU  Anesthesia Type:General  Level of Consciousness: Patient remains intubated per anesthesia plan  Airway & Oxygen Therapy: Patient remains intubated per anesthesia plan and Patient placed on Ventilator (see vital sign flow sheet for setting)  Post-op Assessment: Report given to RN and Post -op Vital signs reviewed and stable  Post vital signs: Reviewed and stable  Last Vitals:  Vitals Value Taken Time  BP 97/67 03/09/21 1357  Temp    Pulse 117 03/09/21 1404  Resp 26 03/09/21 1404  SpO2 98 % 03/09/21 1404  Vitals shown include unvalidated device data.  Last Pain:  Vitals:   03/09/21 0655  TempSrc:   PainSc: 0-No pain         Complications: No notable events documented.

## 2021-03-09 NOTE — Anesthesia Procedure Notes (Signed)
Arterial Line Insertion Start/End10/13/2022 7:15 AM, 03/09/2021 7:20 AM Performed by: Darral Dash, DO, Reece Agar, CRNA, CRNA  Patient location: Pre-op. Preanesthetic checklist: patient identified, IV checked, site marked, risks and benefits discussed, surgical consent, monitors and equipment checked, pre-op evaluation, timeout performed and anesthesia consent Lidocaine 1% used for infiltration Right, radial was placed Catheter size: 20 G Hand hygiene performed  and maximum sterile barriers used   Attempts: 1 Procedure performed without using ultrasound guided technique. Following insertion, dressing applied and Biopatch. Post procedure assessment: normal and unchanged  Patient tolerated the procedure well with no immediate complications. Additional procedure comments: Line placed by Bluford Kaufmann, SRNA with direct supervision from [place], CRNA. Marland Kitchen

## 2021-03-09 NOTE — Procedures (Signed)
Central Venous Catheter Insertion Procedure Note  TRULEE HAMSTRA  [id number]  1941/03/22  Date:03/09/21  Time:5:27 PM   Provider Performing:Taleah Bellantoni Marianna Payment   Procedure: Insertion of Non-tunneled Central Venous Catheter(36556) with US guidance (48016)   Indication(s) Medication administration  Consent Unable to obtain consent due to emergent nature of procedure.  Anesthesia Topical only with 1% lidocaine   Timeout Verified patient identification, verified procedure, site/side was marked, verified correct patient position, special equipment/implants available, medications/allergies/relevant history reviewed, required imaging and test results available.  Sterile Technique Maximal sterile technique including full sterile barrier drape, hand hygiene, sterile gown, sterile gloves, mask, hair covering, sterile ultrasound probe cover (if used).  Procedure Description Area of catheter insertion was cleaned with chlorhexidine and draped in sterile fashion.  With real-time ultrasound guidance a central venous catheter was placed into the right femoral vein. Nonpulsatile blood flow and easy flushing noted in all ports.  The catheter was sutured in place and sterile dressing applied.  Complications/Tolerance None; patient tolerated the procedure well. Chest X-ray is ordered to verify placement for internal jugular or subclavian cannulation.   Chest x-ray is not ordered for femoral cannulation.  EBL Minimal  Specimen(s) None  Lawerance Cruel, D.O.  Internal Medicine Resident, PGY-3 [facility]  Pager: [phone] 5:28 PM, 03/09/2021

## 2021-03-09 NOTE — Anesthesia Procedure Notes (Signed)
Central Venous Catheter Insertion Performed by: Darral Dash, DO, anesthesiologist Start/End10/13/2022 1:18 PM, 03/09/2021 1:28 PM Patient location: OR. Emergency situation Preanesthetic checklist: patient identified, IV checked, site marked, risks and benefits discussed, surgical consent, monitors and equipment checked, pre-op evaluation, timeout performed and anesthesia consent Position: Trendelenburg Patient sedated Hand hygiene performed  and maximum sterile barriers used  Catheter size: 8 Fr Total catheter length 16. Central line was placed.Double lumen Procedure performed using ultrasound guided technique. Ultrasound Notes:anatomy identified, needle tip was noted to be adjacent to the nerve/plexus identified, no ultrasound evidence of intravascular and/or intraneural injection and image(s) printed for medical record Attempts: 1 Following insertion, dressing applied, line sutured and Biopatch. Post procedure assessment: blood return through all ports  Patient tolerated the procedure well with no immediate complications.

## 2021-03-09 NOTE — Progress Notes (Signed)
      [address]       [place] [address]             [phone]        Continues to drain about 150 ml/hr from CT  CBC shows thrombocytopenia with PLT 51K Coagulopathy with PT 29 and PTT 84  Will give 1 bag of platelets and 2 more units FFP(4 total)  Repeat labs after products given  Appreciate CCM assistance  Norberto Wishon C. Roxan Hockey, MD [facility] [phone]

## 2021-03-09 NOTE — Anesthesia Procedure Notes (Addendum)
Procedure Name: Intubation Date/Time: 03/09/2021 8:34 AM Performed by: Reece Agar, CRNA Pre-anesthesia Checklist: Patient identified, Emergency Drugs available, Suction available, Patient being monitored and Timeout performed Patient Re-evaluated:Patient Re-evaluated prior to induction Oxygen Delivery Method: Circle system utilized Preoxygenation: Pre-oxygenation with 100% oxygen Induction Type: IV induction Ventilation: Mask ventilation without difficulty Laryngoscope Size: Mac and 3 Grade View: Grade II Endobronchial tube: Double lumen EBT, EBT position confirmed by fiberoptic bronchoscope, Left and EBT position confirmed by auscultation and 37 Fr Number of attempts: 2 Airway Equipment and Method: Stylet, Video-laryngoscopy and Fiberoptic brochoscope Placement Confirmation: ETT inserted through vocal cords under direct vision and positive ETCO2 Secured at: 27 cm Tube secured with: Tape Dental Injury: Teeth and Oropharynx as per pre-operative assessment and Bloody posterior oropharynx  Comments: SRNA attempt with videolaryngoscope, unable to pass DLT. Second attempt with MAC 3 blade, CRNA able to pass DLT. Confirmed placement with fiberoptic bronchoscope.

## 2021-03-09 NOTE — Hospital Course (Addendum)
HPI: [name] is sent for consultation regarding a left upper lobe lung nodule.  [name] is a 80 year old woman with a history of endometrial cancer, adenocarcinoma the vaginal cuff, DVT after surgery, malignant gastrointestinal stromal tumor, Whipple procedure, type 2 diabetes, multiple basal cell carcinomas, coronary atherosclerosis, hypertension, hyperlipidemia, iron deficiency anemia, arthritis, and reflux.  She had a hysterectomy in 2018 for endometrial cancer.  That was complicated by a DVT in her right leg.  More recently she was found to have a recurrence of the vaginal cuff.  That was treated with radiation.  She had a PET/CT in June which showed a new 9 x 12 millimeter left upper lobe lung nodule, which was hypermetabolic with an SUV of 5.  There was no evidence of a lesion in that area on her prior PET from January.  She was in [place] visiting her sister and recently returned to [place].  She had a CT of the chest which showed the nodule had increased in size.  She is a lifelong non-smoker.  She denied having any fevers or chills.  She says she will occasionally have a night sweat about once every 2 months.  She is not having any cough, wheezing, or shortness of breath.  Denies chest pain, pressure, or tightness.  Her appetite is fair.  She has not had any recent weight loss although she is lost about 40 pounds over the past couple of years.   After reviewing the patient and all relevant studies it was Dr. Leonarda Salon recommendation that she be admitted for elective resection.  Hospital course:  The patient was admitted electively on 03/09/2021 taken to the operating room at which time she underwent a left upper lobe trisegmentectomy.  She did have an intraoperative complication of pulmonary artery injury necessitating transition from robotic to open thoracotomy.  She required transfusion of 4 units of packed red blood cells.  Bleeding was controlled and she was stabilized  and transferred to the surgical intensive care unit in stable condition. Initial pathology was positive for malignancy with negative margins.  Critical care medicine consultation was obtained to assist with management.  She was found to have a significant postoperative coagulopathy and received additional blood products.  She was kept on the ventilator overnight and additionally required some inotropic support including norepinephrine.  The ventilator will be weaned per the CCM service over time based on clinical progression.  Her anemia has stabilized and chest tube drainage is decreasing over time.  She is noted to be neurologically intact on postoperative day #1. She was extubated on 03/10/2021.  She was weaned off inotropic support by 03/11/2021.  She was hypertensive and her Lopressor was increased to her home dose.  There was no evidence of air leak and her chest tube was transitioned to water seal.  She was restarted on home regimen of Jardiance for additional CBG control.  She developed difficulty swallowing and SLP evaluation was obtained.  There were no signs of dysphagia under observation. Per patient, dysphagia continued to improve. She was felt medically stable for transfer to the progressive care unit on 03/13/2021. Her platelet count decreased to 45,000 on 03/14/2021 but she had no active signs of bleeding. Gradually, her platelet count increased. It was up to 130,000 on 10/20. She went into a fib with RVR on 10/19. She was placed on an Amiodarone drip. She converted to sinus rhythm and was transitioned to oral Amiodarone. Chest tube output gradually decreased. Chest tube was removed on 03/17/2021.  Follow up CXR showed increase in left pleural effusion with worsening aeration.  Trace left apical pneumothorax remained stable.  She was hyponatremic which slowly resolved without intervetion.  Her hemoglobin level remained stable at 9.8. Her chest x ray worsened in regards to aeration;although  clinically she was not hypoxic and was stable. Her WBC increased to 17,000 but she remained afebrile. CXR on 10/23 showed decrease in volume of left pleural effusion with improved aeration to the left upper lobe and left midlung. These chnges were carefully reviewed by her surgeon and ffelt to be stable not requiring any intervention. WBC on 10/24 was down to 13,300 and the hematocrit was trending up. She was started on low dose Eliquis for chronic Atrial Fibrillation. Due to her prolonged hospitalization after major surgrery she became significantly deconditioned.  She was evaluated by the PT and OT staff and was felt to be appropriate for inpatinet rehab. Unfortunately her insurance would not authorize this, and felt SNF was indicated. This has been arranged and she is medically stable for discharge to SNF today.

## 2021-03-10 ENCOUNTER — Encounter (HOSPITAL_COMMUNITY): Payer: Self-pay | Admitting: Thoracic Surgery (Cardiothoracic Vascular Surgery)

## 2021-03-10 ENCOUNTER — Inpatient Hospital Stay (HOSPITAL_COMMUNITY): Payer: PPO

## 2021-03-10 DIAGNOSIS — J9601 Acute respiratory failure with hypoxia: Secondary | ICD-10-CM

## 2021-03-10 DIAGNOSIS — D62 Acute posthemorrhagic anemia: Secondary | ICD-10-CM

## 2021-03-10 DIAGNOSIS — C3490 Malignant neoplasm of unspecified part of unspecified bronchus or lung: Secondary | ICD-10-CM | POA: Diagnosis present

## 2021-03-10 DIAGNOSIS — R578 Other shock: Secondary | ICD-10-CM

## 2021-03-10 LAB — CBC
HCT: 18.2 % — ABNORMAL LOW (ref 36.0–46.0)
HCT: 22.1 % — ABNORMAL LOW (ref 36.0–46.0)
HCT: 22.3 % — ABNORMAL LOW (ref 36.0–46.0)
HCT: 23.1 % — ABNORMAL LOW (ref 36.0–46.0)
HCT: 28.9 % — ABNORMAL LOW (ref 36.0–46.0)
Hemoglobin: 10.1 g/dL — ABNORMAL LOW (ref 12.0–15.0)
Hemoglobin: 6.1 g/dL — CL (ref 12.0–15.0)
Hemoglobin: 8.1 g/dL — ABNORMAL LOW (ref 12.0–15.0)
Hemoglobin: 8.1 g/dL — ABNORMAL LOW (ref 12.0–15.0)
Hemoglobin: 8.2 g/dL — ABNORMAL LOW (ref 12.0–15.0)
MCH: 29.7 pg (ref 26.0–34.0)
MCH: 30.3 pg (ref 26.0–34.0)
MCH: 30.5 pg (ref 26.0–34.0)
MCH: 30.7 pg (ref 26.0–34.0)
MCH: 31.3 pg (ref 26.0–34.0)
MCHC: 33.5 g/dL (ref 30.0–36.0)
MCHC: 34.9 g/dL (ref 30.0–36.0)
MCHC: 35.5 g/dL (ref 30.0–36.0)
MCHC: 36.3 g/dL — ABNORMAL HIGH (ref 30.0–36.0)
MCHC: 36.7 g/dL — ABNORMAL HIGH (ref 30.0–36.0)
MCV: 83.7 fL (ref 80.0–100.0)
MCV: 83.7 fL (ref 80.0–100.0)
MCV: 83.8 fL (ref 80.0–100.0)
MCV: 86.8 fL (ref 80.0–100.0)
MCV: 93.3 fL (ref 80.0–100.0)
Platelets: 32 10*3/uL — ABNORMAL LOW (ref 150–400)
Platelets: 52 10*3/uL — ABNORMAL LOW (ref 150–400)
Platelets: 53 10*3/uL — ABNORMAL LOW (ref 150–400)
Platelets: 53 10*3/uL — ABNORMAL LOW (ref 150–400)
Platelets: 82 10*3/uL — ABNORMAL LOW (ref 150–400)
RBC: 1.95 MIL/uL — ABNORMAL LOW (ref 3.87–5.11)
RBC: 2.64 MIL/uL — ABNORMAL LOW (ref 3.87–5.11)
RBC: 2.66 MIL/uL — ABNORMAL LOW (ref 3.87–5.11)
RBC: 2.76 MIL/uL — ABNORMAL LOW (ref 3.87–5.11)
RBC: 3.33 MIL/uL — ABNORMAL LOW (ref 3.87–5.11)
RDW: 14.3 % (ref 11.5–15.5)
RDW: 14.8 % (ref 11.5–15.5)
RDW: 14.9 % (ref 11.5–15.5)
RDW: 15.2 % (ref 11.5–15.5)
RDW: 15.8 % — ABNORMAL HIGH (ref 11.5–15.5)
WBC: 6.6 10*3/uL (ref 4.0–10.5)
WBC: 6.7 10*3/uL (ref 4.0–10.5)
WBC: 7.5 10*3/uL (ref 4.0–10.5)
WBC: 7.6 10*3/uL (ref 4.0–10.5)
WBC: 9.2 10*3/uL (ref 4.0–10.5)
nRBC: 0 % (ref 0.0–0.2)
nRBC: 0.3 % — ABNORMAL HIGH (ref 0.0–0.2)
nRBC: 0.3 % — ABNORMAL HIGH (ref 0.0–0.2)
nRBC: 0.3 % — ABNORMAL HIGH (ref 0.0–0.2)
nRBC: 0.5 % — ABNORMAL HIGH (ref 0.0–0.2)

## 2021-03-10 LAB — PREPARE FRESH FROZEN PLASMA
Unit division: 0
Unit division: 0
Unit division: 0
Unit division: 0
Unit division: 0
Unit division: 0
Unit division: 0
Unit division: 0

## 2021-03-10 LAB — COMPREHENSIVE METABOLIC PANEL
ALT: 52 U/L — ABNORMAL HIGH (ref 0–44)
AST: 149 U/L — ABNORMAL HIGH (ref 15–41)
Albumin: 2.3 g/dL — ABNORMAL LOW (ref 3.5–5.0)
Alkaline Phosphatase: 49 U/L (ref 38–126)
Anion gap: 15 (ref 5–15)
BUN: 13 mg/dL (ref 8–23)
CO2: 20 mmol/L — ABNORMAL LOW (ref 22–32)
Calcium: 7.9 mg/dL — ABNORMAL LOW (ref 8.9–10.3)
Chloride: 104 mmol/L (ref 98–111)
Creatinine, Ser: 1.26 mg/dL — ABNORMAL HIGH (ref 0.44–1.00)
GFR, Estimated: 44 mL/min — ABNORMAL LOW (ref >60)
Glucose, Bld: 341 mg/dL — ABNORMAL HIGH (ref 70–99)
Potassium: 2.9 mmol/L — ABNORMAL LOW (ref 3.5–5.1)
Sodium: 139 mmol/L (ref 135–145)
Total Bilirubin: 1.1 mg/dL (ref 0.3–1.2)
Total Protein: 3.8 g/dL — ABNORMAL LOW (ref 6.5–8.1)

## 2021-03-10 LAB — PREPARE PLATELET PHERESIS
Unit division: 0
Unit division: 0
Unit division: 0

## 2021-03-10 LAB — BPAM FFP
Blood Product Expiration Date: 202210172359
Blood Product Expiration Date: 202210172359
Blood Product Expiration Date: 202210172359
Blood Product Expiration Date: 202210172359
Blood Product Expiration Date: 202210182359
Blood Product Expiration Date: 202210182359
Blood Product Expiration Date: 202210182359
Blood Product Expiration Date: 202210182359
Blood Product Expiration Date: 202210182359
Blood Product Expiration Date: 202210182359
Blood Product Expiration Date: 202210182359
Blood Product Expiration Date: 202210252359
Blood Product Expiration Date: 202210172359
Blood Product Expiration Date: 202210172359
Blood Product Expiration Date: 202210182359
Blood Product Expiration Date: 202210182359
ISSUE DATE / TIME: 202210131208
ISSUE DATE / TIME: 202210131221
ISSUE DATE / TIME: 202210131221
ISSUE DATE / TIME: 202210131221
ISSUE DATE / TIME: 202210131221
ISSUE DATE / TIME: 202210131631
ISSUE DATE / TIME: 202210131631
ISSUE DATE / TIME: 202210131731
ISSUE DATE / TIME: 202210131731
ISSUE DATE / TIME: 202210132021
ISSUE DATE / TIME: 202210132121
ISSUE DATE / TIME: 202210140840
ISSUE DATE / TIME: 202210132021
ISSUE DATE / TIME: 202210132121
ISSUE DATE / TIME: 202210132258
ISSUE DATE / TIME: 202210132258
Unit Type and Rh: 5100
Unit Type and Rh: 5100
Unit Type and Rh: 5100
Unit Type and Rh: 5100
Unit Type and Rh: 5100
Unit Type and Rh: 600
Unit Type and Rh: 600
Unit Type and Rh: 6200
Unit Type and Rh: 6200
Unit Type and Rh: 6200
Unit Type and Rh: 6200
Unit Type and Rh: 9500
Unit Type and Rh: 5100
Unit Type and Rh: 9500
Unit Type and Rh: 5100
Unit Type and Rh: 5100

## 2021-03-10 LAB — GLUCOSE, CAPILLARY
Glucose-Capillary: 102 mg/dL — ABNORMAL HIGH (ref 70–99)
Glucose-Capillary: 106 mg/dL — ABNORMAL HIGH (ref 70–99)
Glucose-Capillary: 110 mg/dL — ABNORMAL HIGH (ref 70–99)
Glucose-Capillary: 116 mg/dL — ABNORMAL HIGH (ref 70–99)
Glucose-Capillary: 116 mg/dL — ABNORMAL HIGH (ref 70–99)
Glucose-Capillary: 120 mg/dL — ABNORMAL HIGH (ref 70–99)
Glucose-Capillary: 120 mg/dL — ABNORMAL HIGH (ref 70–99)
Glucose-Capillary: 127 mg/dL — ABNORMAL HIGH (ref 70–99)
Glucose-Capillary: 155 mg/dL — ABNORMAL HIGH (ref 70–99)
Glucose-Capillary: 175 mg/dL — ABNORMAL HIGH (ref 70–99)
Glucose-Capillary: 219 mg/dL — ABNORMAL HIGH (ref 70–99)
Glucose-Capillary: 234 mg/dL — ABNORMAL HIGH (ref 70–99)
Glucose-Capillary: 249 mg/dL — ABNORMAL HIGH (ref 70–99)
Glucose-Capillary: 275 mg/dL — ABNORMAL HIGH (ref 70–99)
Glucose-Capillary: 287 mg/dL — ABNORMAL HIGH (ref 70–99)
Glucose-Capillary: 292 mg/dL — ABNORMAL HIGH (ref 70–99)
Glucose-Capillary: 294 mg/dL — ABNORMAL HIGH (ref 70–99)
Glucose-Capillary: 297 mg/dL — ABNORMAL HIGH (ref 70–99)
Glucose-Capillary: 340 mg/dL — ABNORMAL HIGH (ref 70–99)
Glucose-Capillary: 373 mg/dL — ABNORMAL HIGH (ref 70–99)
Glucose-Capillary: 376 mg/dL — ABNORMAL HIGH (ref 70–99)
Glucose-Capillary: 98 mg/dL (ref 70–99)

## 2021-03-10 LAB — PREPARE RBC (CROSSMATCH)

## 2021-03-10 LAB — PREPARE CRYOPRECIPITATE: Unit division: 0

## 2021-03-10 LAB — POCT I-STAT 7, (LYTES, BLD GAS, ICA,H+H)
Acid-base deficit: 3 mmol/L — ABNORMAL HIGH (ref 0.0–2.0)
Bicarbonate: 20.4 mmol/L (ref 20.0–28.0)
Calcium, Ion: 1.06 mmol/L — ABNORMAL LOW (ref 1.15–1.40)
HCT: 28 % — ABNORMAL LOW (ref 36.0–46.0)
Hemoglobin: 9.5 g/dL — ABNORMAL LOW (ref 12.0–15.0)
O2 Saturation: 100 %
Patient temperature: 37.7
Potassium: 2.9 mmol/L — ABNORMAL LOW (ref 3.5–5.1)
Sodium: 141 mmol/L (ref 135–145)
TCO2: 21 mmol/L — ABNORMAL LOW (ref 22–32)
pCO2 arterial: 31.9 mmHg — ABNORMAL LOW (ref 32.0–48.0)
pH, Arterial: 7.418 (ref 7.350–7.450)
pO2, Arterial: 184 mmHg — ABNORMAL HIGH (ref 83.0–108.0)

## 2021-03-10 LAB — BASIC METABOLIC PANEL
Anion gap: 22 — ABNORMAL HIGH (ref 5–15)
Anion gap: 10 (ref 5–15)
BUN: 12 mg/dL (ref 8–23)
BUN: 15 mg/dL (ref 8–23)
CO2: 15 mmol/L — ABNORMAL LOW (ref 22–32)
CO2: 26 mmol/L (ref 22–32)
Calcium: 7.9 mg/dL — ABNORMAL LOW (ref 8.9–10.3)
Calcium: 8.1 mg/dL — ABNORMAL LOW (ref 8.9–10.3)
Chloride: 104 mmol/L (ref 98–111)
Chloride: 104 mmol/L (ref 98–111)
Creatinine, Ser: 1.22 mg/dL — ABNORMAL HIGH (ref 0.44–1.00)
Creatinine, Ser: 1.08 mg/dL — ABNORMAL HIGH (ref 0.44–1.00)
GFR, Estimated: 46 mL/min — ABNORMAL LOW (ref >60)
GFR, Estimated: 53 mL/min — ABNORMAL LOW (ref >60)
Glucose, Bld: 345 mg/dL — ABNORMAL HIGH (ref 70–99)
Glucose, Bld: 112 mg/dL — ABNORMAL HIGH (ref 70–99)
Potassium: 3 mmol/L — ABNORMAL LOW (ref 3.5–5.1)
Potassium: 2.8 mmol/L — ABNORMAL LOW (ref 3.5–5.1)
Sodium: 141 mmol/L (ref 135–145)
Sodium: 140 mmol/L (ref 135–145)

## 2021-03-10 LAB — PROTIME-INR
INR: 1.7 — ABNORMAL HIGH (ref 0.8–1.2)
Prothrombin Time: 20.3 seconds — ABNORMAL HIGH (ref 11.4–15.2)

## 2021-03-10 LAB — DIC (DISSEMINATED INTRAVASCULAR COAGULATION)PANEL
D-Dimer, Quant: 11.89 ug/mL-FEU — ABNORMAL HIGH (ref 0.00–0.50)
Fibrinogen: 189 mg/dL — ABNORMAL LOW (ref 210–475)
INR: 1.9 — ABNORMAL HIGH (ref 0.8–1.2)
Platelets: 49 10*3/uL — ABNORMAL LOW (ref 150–400)
Prothrombin Time: 22.1 seconds — ABNORMAL HIGH (ref 11.4–15.2)
Smear Review: NONE SEEN
aPTT: 38 seconds — ABNORMAL HIGH (ref 24–36)

## 2021-03-10 LAB — BPAM PLATELET PHERESIS
Blood Product Expiration Date: 202210152359
Blood Product Expiration Date: 202210152359
Blood Product Expiration Date: 202210152359
ISSUE DATE / TIME: 202210131628
ISSUE DATE / TIME: 202210131731
ISSUE DATE / TIME: 202210132121
Unit Type and Rh: 5100
Unit Type and Rh: 6200
Unit Type and Rh: 6200

## 2021-03-10 LAB — PHOSPHORUS: Phosphorus: 2.8 mg/dL (ref 2.5–4.6)

## 2021-03-10 LAB — BPAM CRYOPRECIPITATE
Blood Product Expiration Date: 202210131810
ISSUE DATE / TIME: 202210131628
Unit Type and Rh: 5100

## 2021-03-10 LAB — TRIGLYCERIDES: Triglycerides: 78 mg/dL (ref <150)

## 2021-03-10 LAB — MAGNESIUM: Magnesium: 1.3 mg/dL — ABNORMAL LOW (ref 1.7–2.4)

## 2021-03-10 MED ORDER — ENOXAPARIN SODIUM 40 MG/0.4ML IJ SOSY: 40.0000 mg | PREFILLED_SYRINGE | INTRAMUSCULAR | Status: DC

## 2021-03-10 MED ORDER — IPRATROPIUM-ALBUTEROL 0.5-2.5 (3) MG/3ML IN SOLN: 3.0000 mL | Freq: Four times a day (QID) | RESPIRATORY_TRACT | Status: DC | PRN

## 2021-03-10 MED ORDER — POTASSIUM CHLORIDE 10 MEQ/50ML IV SOLN: 10.0000 meq | INTRAVENOUS | Status: DC

## 2021-03-10 MED ORDER — CEFAZOLIN SODIUM-DEXTROSE 2-4 GM/100ML-% IV SOLN
2.0000 g | Freq: Three times a day (TID) | INTRAVENOUS | Status: AC
  Administered 2021-03-10 (×2): 2 g via INTRAVENOUS
  Filled 2021-03-10 (×2): qty 100

## 2021-03-10 MED ORDER — ACETAMINOPHEN 160 MG/5ML PO SOLN
1000.0000 mg | Freq: Four times a day (QID) | ORAL | Status: DC
  Administered 2021-03-10 (×2): 1000 mg
  Filled 2021-03-10 (×3): qty 40.6

## 2021-03-10 MED ORDER — SODIUM CHLORIDE 0.9% IV SOLUTION: Freq: Once | INTRAVENOUS | Status: AC

## 2021-03-10 MED ORDER — TRAMADOL HCL 50 MG PO TABS
50.0000 mg | ORAL_TABLET | Freq: Four times a day (QID) | ORAL | Status: DC | PRN
  Administered 2021-03-10: 100 mg
  Filled 2021-03-10: qty 1
  Filled 2021-03-10: qty 2

## 2021-03-10 MED ORDER — SODIUM CHLORIDE 0.9 % IV SOLN: INTRAVENOUS | Status: DC

## 2021-03-10 MED ORDER — POTASSIUM CHLORIDE 10 MEQ/50ML IV SOLN
10.0000 meq | INTRAVENOUS | Status: AC
  Administered 2021-03-10 (×3): 10 meq via INTRAVENOUS
  Filled 2021-03-10 (×3): qty 50

## 2021-03-10 MED ORDER — ACETAMINOPHEN 500 MG PO TABS
1000.0000 mg | ORAL_TABLET | Freq: Four times a day (QID) | ORAL | Status: DC
  Administered 2021-03-10 – 2021-03-11 (×3): 1000 mg
  Filled 2021-03-10 (×5): qty 2

## 2021-03-10 MED ORDER — OXYCODONE HCL 5 MG PO TABS
5.0000 mg | ORAL_TABLET | ORAL | Status: DC | PRN
  Administered 2021-03-10: 5 mg
  Administered 2021-03-10 – 2021-03-11 (×3): 10 mg
  Filled 2021-03-10: qty 1
  Filled 2021-03-10 (×3): qty 2

## 2021-03-10 MED ORDER — DEXMEDETOMIDINE HCL IN NACL 400 MCG/100ML IV SOLN
0.4000 ug/kg/h | INTRAVENOUS | Status: DC
  Administered 2021-03-10: 0.4 ug/kg/h via INTRAVENOUS
  Filled 2021-03-10: qty 100

## 2021-03-10 MED ORDER — FENTANYL CITRATE PF 50 MCG/ML IJ SOSY
25.0000 ug | PREFILLED_SYRINGE | INTRAMUSCULAR | Status: DC | PRN
  Administered 2021-03-11 (×2): 50 ug via INTRAVENOUS
  Filled 2021-03-10 (×2): qty 1

## 2021-03-10 MED ORDER — SODIUM CHLORIDE 0.9% IV SOLUTION: Freq: Once | INTRAVENOUS | Status: DC

## 2021-03-10 MED ORDER — MAGNESIUM SULFATE 4 GM/100ML IV SOLN
4.0000 g | Freq: Once | INTRAVENOUS | Status: AC
  Administered 2021-03-10: 4 g via INTRAVENOUS
  Filled 2021-03-10: qty 100

## 2021-03-10 MED ORDER — BISACODYL 5 MG PO TBEC
10.0000 mg | DELAYED_RELEASE_TABLET | Freq: Every day | ORAL | Status: DC
  Administered 2021-03-11 – 2021-03-28 (×13): 10 mg via ORAL
  Filled 2021-03-10 (×14): qty 2

## 2021-03-10 MED ORDER — SENNOSIDES 8.8 MG/5ML PO SYRP
5.0000 mL | ORAL_SOLUTION | Freq: Every day | ORAL | Status: DC
  Administered 2021-03-10 (×2): 5 mL
  Filled 2021-03-10 (×2): qty 5

## 2021-03-10 MED ORDER — IPRATROPIUM-ALBUTEROL 0.5-2.5 (3) MG/3ML IN SOLN
3.0000 mL | Freq: Four times a day (QID) | RESPIRATORY_TRACT | Status: DC
  Administered 2021-03-10 (×2): 3 mL via RESPIRATORY_TRACT
  Filled 2021-03-10: qty 39
  Filled 2021-03-10: qty 3

## 2021-03-10 MED ORDER — POTASSIUM CHLORIDE 10 MEQ/50ML IV SOLN
10.0000 meq | INTRAVENOUS | Status: AC
  Administered 2021-03-10 (×5): 10 meq via INTRAVENOUS
  Filled 2021-03-10 (×5): qty 50

## 2021-03-10 MED ORDER — POTASSIUM CHLORIDE 10 MEQ/50ML IV SOLN
10.0000 meq | INTRAVENOUS | Status: AC
  Administered 2021-03-10 (×6): 10 meq via INTRAVENOUS
  Filled 2021-03-10 (×6): qty 50

## 2021-03-10 MED ORDER — ONDANSETRON HCL 4 MG/2ML IJ SOLN: 4.0000 mg | Freq: Four times a day (QID) | INTRAMUSCULAR | Status: DC | PRN

## 2021-03-10 MED ORDER — SENNOSIDES-DOCUSATE SODIUM 8.6-50 MG PO TABS: 1.0000 | ORAL_TABLET | Freq: Every day | ORAL | Status: DC

## 2021-03-10 NOTE — Procedures (Signed)
Extubation Procedure Note  Patient Details:   Name: [name] DOB: November 13, 1940 MRN: [id number]   Airway Documentation:    Vent end date: 03/10/21 Vent end time: 1145   Evaluation  O2 sats: stable throughout Complications: No apparent complications Patient did tolerate procedure well. Bilateral Breath Sounds: Clear, Diminished   Yes  Pt extubated per physician order. Pt with positive cuff leak, VC 560 and NIF -15. Dr. Carlis Abbott aware of findings and at pt bedside to assess. Pt then suctioned via ETT and orally prior. Pt extubated to 2L nasal cannula. Pt able to speak name, give a good cough and no stridor heard at this time. IS and flutter given to pt. RT will continue to monitor and be available as needed.   Sharla Kidney 03/10/2021, 11:49 AM

## 2021-03-10 NOTE — Progress Notes (Signed)
1 Day Post-Op Procedure(s) (LRB): XI ROBOTIC ASSISTED THORASCOPY-LEFT UPPER LOBE SEGMENTECTOMY (Left) INTERCOSTAL NERVE BLOCK (Left) LYMPH NODE DISSECTION (Left) THORACOTOMY MAJOR (Left) Subjective: Intubated. Follows commands, generalized weakness  Objective: Vital signs in last 24 hours: Temp:  [95 F (35 C)-100 F (37.8 C)] 100 F (37.8 C) (10/14 0630) Pulse Rate:  [108-141] 111 (10/14 0630) Cardiac Rhythm: Sinus tachycardia (10/14 0400) Resp:  [11-29] 25 (10/14 0630) BP: (69-165)/(22-80) 125/61 (10/14 0600) SpO2:  [97 %-100 %] 100 % (10/14 0630) Arterial Line BP: (74-183)/(44-79) 98/50 (10/14 0630) FiO2 (%):  [40 %-50 %] 40 % (10/14 0641)  Hemodynamic parameters for last 24 hours: CVP:  [4 mmHg-17 mmHg] 7 mmHg  Intake/Output from previous day: 10/13 0701 - 10/14 0700 In: 23087.8 [I.V.:4635.9; Blood:17201.7; NG/GT:400; IV Piggyback:850.2] Out: 6045 [Urine:1225; Emesis/NG output:150; Blood:3000; Chest Tube:1670] Intake/Output this shift: No intake/output data recorded.  General appearance: alert, cooperative, and no distress Neurologic: nonfocal Heart: tachy, regular Lungs: clear to auscultation bilaterally Abdomen: normal findings: soft, non-tender + air leak, serosanguinous drainage from tube  Lab Results: Recent Labs    03/10/21 0034 03/10/21 0539 03/10/21 0541  WBC 6.6 7.5  --   HGB 6.1* 10.1* 9.5*  HCT 18.2* 28.9* 28.0*  PLT 82* 49*  53*  --    BMET:  Recent Labs    03/10/21 0034 03/10/21 0539 03/10/21 0541  NA 141 139 141  K 3.0* 2.9* 2.9*  CL 104 104  --   CO2 15* 20*  --   GLUCOSE 345* 341*  --   BUN 12 13  --   CREATININE 1.22* 1.26*  --   CALCIUM 7.9* 7.9*  --     PT/INR:  Recent Labs    03/10/21 0539  LABPROT 22.1*  INR 1.9*   ABG    Component Value Date/Time   PHART 7.418 03/10/2021 0541   HCO3 20.4 03/10/2021 0541   TCO2 21 (L) 03/10/2021 0541   ACIDBASEDEF 3.0 (H) 03/10/2021 0541   O2SAT 100.0 03/10/2021 0541   CBG  (last 3)  Recent Labs    03/10/21 0407 03/10/21 0505 03/10/21 0606  GLUCAP 373* 340* 294*    Assessment/Plan: S/P Procedure(s) (LRB): XI ROBOTIC ASSISTED THORASCOPY-LEFT UPPER LOBE SEGMENTECTOMY (Left) INTERCOSTAL NERVE BLOCK (Left) LYMPH NODE DISSECTION (Left) THORACOTOMY MAJOR (Left) POD # 1 NEURO- no focal deficit CV- hemodynamics improved with correction of bleeding  Wean norepi RESP- CXR ok, good gas exchange  Vent per CCM RENAL- creatinine up slightly to 1.26  Hypokalemia- replete K ENDO- CBG markedly elevated even on high dose insulin drip  Continue protocol GI- OG in place Anemia secondary to massive blood loss- Hct 28 Coagulopathy- improved Thrombocytopenia- PLT count still low after 2 transfusions  No significant bleeding currently- monitor SCD for DVT prophylaxis  LOS: 1 day    [name] 03/10/2021

## 2021-03-10 NOTE — Anesthesia Postprocedure Evaluation (Signed)
Anesthesia Post Note  Patient: [name]  Procedure(s) Performed: XI ROBOTIC ASSISTED THORASCOPY-LEFT UPPER LOBE SEGMENTECTOMY (Left: Chest) INTERCOSTAL NERVE BLOCK (Left: Chest) LYMPH NODE DISSECTION (Left: Chest) THORACOTOMY MAJOR (Left: Chest)     Patient location during evaluation: SICU Anesthesia Type: General Level of consciousness: sedated Pain management: pain level controlled Vital Signs Assessment: post-procedure vital signs reviewed and stable Respiratory status: patient remains intubated per anesthesia plan Cardiovascular status: stable Postop Assessment: no apparent nausea or vomiting Anesthetic complications: no   No notable events documented.  Last Vitals:  Vitals:   03/10/21 1900 03/10/21 2000  BP: (!) 116/49 (!) 111/57  Pulse: 93 95  Resp: (!) 21 (!) 22  Temp: 36.9 C   SpO2: 96% 96%    Last Pain:  Vitals:   03/10/21 2000  TempSrc:   PainSc: 0-No pain                 Belenda Cruise P Mirinda Monte

## 2021-03-10 NOTE — Progress Notes (Signed)
EVENING ROUNDS NOTE :     [address]       [place] [address]             [phone]                 1 Day Post-Op Procedure(s) (LRB): XI ROBOTIC ASSISTED THORASCOPY-LEFT UPPER LOBE SEGMENTECTOMY (Left) INTERCOSTAL NERVE BLOCK (Left) LYMPH NODE DISSECTION (Left) THORACOTOMY MAJOR (Left)   Total Length of Stay:  LOS: 1 day  Events:   No events Extubated earlier today    BP (!) 117/45   Pulse (!) 103   Temp 97.8 F (36.6 C) (Oral)   Resp (!) 22   Ht 5\' 5"  (1.651 m)   Wt 61.2 kg   SpO2 95%   BMI 22.47 kg/m   CVP:  [1 mmHg-11 mmHg] 4 mmHg  Vent Mode: CPAP;PSV FiO2 (%):  [40 %-50 %] 40 % Set Rate:  [25 bmp] 25 bmp Vt Set:  [450 mL] 450 mL PEEP:  [5 cmH20-10 cmH20] 5 cmH20 Pressure Support:  [8 cmH20-10 cmH20] 8 cmH20 Plateau Pressure:  [15 cmH20-21 cmH20] 15 cmH20   sodium chloride 125 mL/hr at 03/10/21 0151   insulin 1.3 Units/hr (03/10/21 1600)   magnesium sulfate bolus IVPB 4 g (03/10/21 1654)   norepinephrine (LEVOPHED) Adult infusion 4 mcg/min (03/10/21 1600)   potassium chloride 10 mEq (03/10/21 1641)    I/O last 3 completed shifts: In: 23087.8 [I.V.:4635.9; Blood:17201.7; NG/GT:400; IV Piggyback:850.2] Out: 6045 [Urine:1225; Emesis/NG output:150; Blood:3000; Chest Tube:1670]   CBC Latest Ref Rng & Units 03/10/2021 03/10/2021 03/10/2021  WBC 4.0 - 10.5 K/uL 9.2 - -  Hemoglobin 12.0 - 15.0 g/dL 8.1(L) 9.5(L) -  Hematocrit 36.0 - 46.0 % 22.1(L) 28.0(L) -  Platelets 150 - 400 K/uL 53(L) - 49(L)    BMP Latest Ref Rng & Units 03/10/2021 03/10/2021 03/10/2021  Glucose 70 - 99 mg/dL 112(H) - 341(H)  BUN 8 - 23 mg/dL 15 - 13  Creatinine 0.44 - 1.00 mg/dL 1.08(H) - 1.26(H)  Sodium 135 - 145 mmol/L 140 141 139  Potassium 3.5 - 5.1 mmol/L 2.8(L) 2.9(L) 2.9(L)  Chloride 98 - 111 mmol/L 104 - 104  CO2 22 - 32 mmol/L 26 - 20(L)  Calcium 8.9 - 10.3 mg/dL 8.1(L) - 7.9(L)    ABG    Component Value Date/Time   PHART 7.418 03/10/2021 0541    PCO2ART 31.9 (L) 03/10/2021 0541   PO2ART 184 (H) 03/10/2021 0541   HCO3 20.4 03/10/2021 0541   TCO2 21 (L) 03/10/2021 0541   ACIDBASEDEF 3.0 (H) 03/10/2021 0541   O2SAT 100.0 03/10/2021 0541       Melodie Bouillon, MD 03/10/2021 5:32 PM

## 2021-03-10 NOTE — Progress Notes (Signed)
About 600cc output from chest tube this shift. Doing well on [place].  CBC    Component Value Date/Time   WBC 9.2 03/10/2021 1403   RBC 2.64 (L) 03/10/2021 1403   HGB 8.1 (L) 03/10/2021 1403   HGB 11.5 (L) 10/24/2016 1551   HCT 22.1 (L) 03/10/2021 1403   HCT 36.1 10/24/2016 1551   PLT 53 (L) 03/10/2021 1403   PLT 240 10/24/2016 1551   MCV 83.7 03/10/2021 1403   MCV 94.0 10/24/2016 1551   MCH 30.7 03/10/2021 1403   MCHC 36.7 (H) 03/10/2021 1403   RDW 14.8 03/10/2021 1403   RDW 15.5 (H) 10/24/2016 1551   LYMPHSABS 1.4 10/24/2016 1551   MONOABS 0.4 10/24/2016 1551   EOSABS 0.2 10/24/2016 1551   BASOSABS 0.0 10/24/2016 1551    Additional 1 unit platelets and 1 unit pRBCs now since she is still on pressors despite sedation being discontinued with extubation.  Daughter updated at bedside.  Julian Hy, DO 03/10/21 3:26 PM [facility]

## 2021-03-10 NOTE — Progress Notes (Signed)
NAME:  [name], MRN:  [id number], DOB:  [birth year], LOS: 1 ADMISSION DATE:  03/09/2021, CONSULTATION DATE:  03/09/2021 REFERRING MD:  Dr. Roxan Hockey, CHIEF COMPLAINT:  Shock  BRIEF HISTORY:    [name] is a 80 y.o. female with a pertinent PMH of exocrine pancreatic insuffiencey, DM, HTN, HLD/CAD, endometrial adenocarcinoma s/p hysterectomy (7253) complicated by provoked DVT, who presented resection of left upper lobe hypermetabolic lung nodule 2/2 to presumed recurrence of endometrial adenocarcinoma.   HISTORY OF PRESENT ILLNESS   [name] is a 80 y.o. female with a pertinent PMH of exocrine pancreatic insuffiencey, DM, HTN, HLD/CAD, endometrial adenocarcinoma s/p hysterectomy (6644) complicated by provoked DVT, who presented VAT resection of left upper lobe hypermetabolic lung nodule 2/2 to presumed recurrence of endometrial adenocarcinoma.   Her hospital course was complicated by intraoperative hemorrhage of roughly 3 liters with hemodynamic instability and resulting PEA arrest with ROSC in roughly 3-5 minutes. The surgery was converted to an open thoracotomy to achieve hemostasis and a chest tube was placed in the left hemithorax. The chest tube has drained roughly 800 cc of sanguinous fluid since returning to the ICU for an estimated total blood loss of roughly 3.8 L. Intraoperative ABG was performed with a critically low ph of 6.89 that has since improved to 7.2 with pressors and 4 units PRBC.   SIGNIFICANT PAST MEDICAL HISTORY   exocrine pancreatic insuffiencey, DM, HTN, HLD/CAD, endometrial adenocarcinoma s/p hysterectomy (0347) complicated by provoked DVT  SIGNIFICANT EVENTS:  10/13 - Admitted for VATS of left upper lobe lung nodule.  10/13 - Rt femoral CVC placed    CULTURES:  N/a  ANTIBIOTICS:  cefazolin  LINES/TUBES:  10/13 Arterial line - Rt radial  10/13 CVC - Rt IJ 10/13 - Lt Chest tube 10/13 - ETT - 7 mm  10/13 CVC - Rt femoral     CONSULTANTS:  PCCM  OBJECTIVE:   BP 125/61   Pulse (!) 111   Temp 100 F (37.8 C)   Resp (!) 25   Ht 5\' 5"  (1.651 m)   Wt 61.2 kg   SpO2 100%   BMI 22.47 kg/m   I/O last 3 completed shifts: In: 23087.8 [I.V.:4635.9; Blood:17201.7; NG/GT:400; IV Piggyback:850.2] Out: 6045 [Urine:1225; Emesis/NG output:150; Blood:3000; Chest Tube:1670]  CVP:  [4 mmHg-17 mmHg] 7 mmHg  Vent Mode: PRVC FiO2 (%):  [40 %-50 %] 40 % Set Rate:  [12 bmp-25 bmp] 25 bmp Vt Set:  [450 mL] 450 mL PEEP:  [5 cmH20-10 cmH20] 5 cmH20 Pressure Support:  [10 cmH20] 10 cmH20 Plateau Pressure:  [15 QQV95-63 cmH20] 15 cmH20  PHYSICAL EXAM: General: ill appearing woman lying in bed intubated, sedated HEENT:  /AT, eyes anicteric, ETT in place. Cardiovascular:  tachycardic, regular rhythm, no murmurs Lungs: Minimal bloody secretions endotracheal tube, decreased but ongoing bloody output from left chest tube.  No rhonchi or rales. Abdomen:  soft, nontender, nondistended Musculoskeletal: No peripheral edema, no clubbing or cyanosis GU: Foley draining yellow urine Skin: Pallor, warm, dry.  No rashes. Neuro: RASS -1, moving all extremities on command  Platelets 53 INR 1.9 D-dimer 11.89 Fibrinogen 189 H/H 10.1/28.9 AST 149 ALT 52 Cr 1.26  CXR personally reviewed> elevated left hemidiaphragm, small effusion versus left lower lobe opacity silhouetting hemidiaphragm  RESOLVED PROBLEM LIST   ASSESSMENT AND PLAN    Acute hypoxic and hypercarbic respiratory failure postoperatively Postop hemothorax -Continue chest tube per TCTS management. -LTVV, 4 to 8 cc/kg ideal body weight with goal plateau's  and 30 driving pressure less than 15. -Wean FiO2 as able to maintain SpO2 greater than 90% -Daily SAT and SBT - VAP prevention protocol - PAD protocol for sedation  Acute blood loss anemia Hemorraghic shock post segmentectomy of a left upper lobe nodule with resulting intraoperative hemorrhage and  hemorrhagic shock.  MTP completed on 10/13, improved significantly today. About 2L bloody output total out from chest tube since yesterday.   Consumptive thrombocytopenia Mild coagulopathy -waiting on peripheral smear -additional 1 unit platelets and FFP today  AGMA -monitor -no longer needing bicarb gtt  Presumed metastatic endometrial adenocarcinoma s/p resection -awaiting path results  Secondary insulin dependant DM, uncontrolled hyperglycemia -insulin gtt -goal BG 140-180  Hypokalemia -repleted -recheck lytes this afternoon  Best Practice / Goals of Care / Disposition.   DVT PROPHYLAXIS:none  GI: PPI  NUTRITION:Npo  MOBILITY:bed rest  FAMILY DISCUSSIONS: pending DISPOSITION ICU  LABS  Glucose Recent Labs  Lab 03/10/21 0011 03/10/21 0102 03/10/21 0204 03/10/21 0310 03/10/21 0407 03/10/21 0505  GLUCAP 275* 234* 297* 376* 373* 340*     BMET Recent Labs  Lab 03/09/21 1800 03/10/21 0034 03/10/21 0539 03/10/21 0541  NA 143 141 139 141  K 3.6 3.0* 2.9* 2.9*  CL 103 104 104  --   CO2 16* 15* 20*  --   BUN 10 12 13   --   CREATININE 0.98 1.22* 1.26*  --   GLUCOSE 394* 345* 341*  --      Liver Enzymes Recent Labs  Lab 03/07/21 1433 03/09/21 1800 03/10/21 0539  AST 22 86* 149*  ALT 14 41 52*  ALKPHOS 106 49 49  BILITOT 0.9 2.0* 1.1  ALBUMIN 3.2* 2.5* 2.3*     Electrolytes Recent Labs  Lab 03/09/21 1800 03/10/21 0034 03/10/21 0539  CALCIUM 8.3* 7.9* 7.9*     CBC Recent Labs  Lab 03/09/21 1800 03/10/21 0034 03/10/21 0539 03/10/21 0541  WBC 4.9 6.6 7.5  --   HGB 7.5* 6.1* 10.1* 9.5*  HCT 22.0* 18.2* 28.9* 28.0*  PLT 46*  46* 82* 49*  53*  --      This patient is critically ill with multiple organ system failure which requires frequent high complexity decision making, assessment, support, evaluation, and titration of therapies. This was completed through the application of advanced monitoring technologies and extensive  interpretation of multiple databases. During this encounter critical care time was devoted to patient care services described in this note for 40 minutes.  Julian Hy, DO 03/10/21 8:04 AM [facility]

## 2021-03-11 ENCOUNTER — Inpatient Hospital Stay (HOSPITAL_COMMUNITY): Payer: PPO

## 2021-03-11 DIAGNOSIS — I10 Essential (primary) hypertension: Secondary | ICD-10-CM | POA: Diagnosis not present

## 2021-03-11 DIAGNOSIS — J95811 Postprocedural pneumothorax: Secondary | ICD-10-CM

## 2021-03-11 DIAGNOSIS — J942 Hemothorax: Secondary | ICD-10-CM

## 2021-03-11 DIAGNOSIS — J9601 Acute respiratory failure with hypoxia: Secondary | ICD-10-CM | POA: Diagnosis not present

## 2021-03-11 LAB — COMPREHENSIVE METABOLIC PANEL
ALT: 30 U/L (ref 0–44)
AST: 60 U/L — ABNORMAL HIGH (ref 15–41)
Albumin: 2.3 g/dL — ABNORMAL LOW (ref 3.5–5.0)
Alkaline Phosphatase: 49 U/L (ref 38–126)
Anion gap: 7 (ref 5–15)
BUN: 16 mg/dL (ref 8–23)
CO2: 27 mmol/L (ref 22–32)
Calcium: 7.5 mg/dL — ABNORMAL LOW (ref 8.9–10.3)
Chloride: 103 mmol/L (ref 98–111)
Creatinine, Ser: 0.84 mg/dL (ref 0.44–1.00)
GFR, Estimated: >60 mL/min (ref >60)
Glucose, Bld: 125 mg/dL — ABNORMAL HIGH (ref 70–99)
Potassium: 4.4 mmol/L (ref 3.5–5.1)
Sodium: 137 mmol/L (ref 135–145)
Total Bilirubin: 0.8 mg/dL (ref 0.3–1.2)
Total Protein: 4.1 g/dL — ABNORMAL LOW (ref 6.5–8.1)

## 2021-03-11 LAB — BASIC METABOLIC PANEL
Anion gap: 7 (ref 5–15)
BUN: 17 mg/dL (ref 8–23)
CO2: 28 mmol/L (ref 22–32)
Calcium: 7.6 mg/dL — ABNORMAL LOW (ref 8.9–10.3)
Chloride: 104 mmol/L (ref 98–111)
Creatinine, Ser: 0.94 mg/dL (ref 0.44–1.00)
GFR, Estimated: >60 mL/min (ref >60)
Glucose, Bld: 115 mg/dL — ABNORMAL HIGH (ref 70–99)
Potassium: 4.5 mmol/L (ref 3.5–5.1)
Sodium: 139 mmol/L (ref 135–145)

## 2021-03-11 LAB — PREPARE PLATELET PHERESIS
Unit division: 0
Unit division: 0

## 2021-03-11 LAB — BPAM PLATELET PHERESIS
Blood Product Expiration Date: 202210152359
Blood Product Expiration Date: 202210152359
ISSUE DATE / TIME: 202210140840
ISSUE DATE / TIME: 202210141522
Unit Type and Rh: 6200
Unit Type and Rh: 7300

## 2021-03-11 LAB — CBC
HCT: 21.7 % — ABNORMAL LOW (ref 36.0–46.0)
HCT: 26.4 % — ABNORMAL LOW (ref 36.0–46.0)
HCT: 27.4 % — ABNORMAL LOW (ref 36.0–46.0)
Hemoglobin: 7.8 g/dL — ABNORMAL LOW (ref 12.0–15.0)
Hemoglobin: 8.9 g/dL — ABNORMAL LOW (ref 12.0–15.0)
Hemoglobin: 9.4 g/dL — ABNORMAL LOW (ref 12.0–15.0)
MCH: 30.5 pg (ref 26.0–34.0)
MCH: 29.6 pg (ref 26.0–34.0)
MCH: 29.7 pg (ref 26.0–34.0)
MCHC: 35.9 g/dL (ref 30.0–36.0)
MCHC: 33.7 g/dL (ref 30.0–36.0)
MCHC: 34.3 g/dL (ref 30.0–36.0)
MCV: 84.8 fL (ref 80.0–100.0)
MCV: 87.7 fL (ref 80.0–100.0)
MCV: 86.4 fL (ref 80.0–100.0)
Platelets: 66 10*3/uL — ABNORMAL LOW (ref 150–400)
Platelets: 74 10*3/uL — ABNORMAL LOW (ref 150–400)
Platelets: 64 10*3/uL — ABNORMAL LOW (ref 150–400)
RBC: 2.56 MIL/uL — ABNORMAL LOW (ref 3.87–5.11)
RBC: 3.01 MIL/uL — ABNORMAL LOW (ref 3.87–5.11)
RBC: 3.17 MIL/uL — ABNORMAL LOW (ref 3.87–5.11)
RDW: 16 % — ABNORMAL HIGH (ref 11.5–15.5)
RDW: 16.2 % — ABNORMAL HIGH (ref 11.5–15.5)
RDW: 16.6 % — ABNORMAL HIGH (ref 11.5–15.5)
WBC: 7.8 10*3/uL (ref 4.0–10.5)
WBC: 8.5 10*3/uL (ref 4.0–10.5)
WBC: 7.8 10*3/uL (ref 4.0–10.5)
nRBC: 0.3 % — ABNORMAL HIGH (ref 0.0–0.2)
nRBC: 0 % (ref 0.0–0.2)
nRBC: 0.3 % — ABNORMAL HIGH (ref 0.0–0.2)

## 2021-03-11 LAB — DIC (DISSEMINATED INTRAVASCULAR COAGULATION)PANEL
D-Dimer, Quant: 12.2 ug/mL-FEU — ABNORMAL HIGH (ref 0.00–0.50)
Fibrinogen: 271 mg/dL (ref 210–475)
INR: 1.6 — ABNORMAL HIGH (ref 0.8–1.2)
Platelets: 66 10*3/uL — ABNORMAL LOW (ref 150–400)
Prothrombin Time: 18.9 seconds — ABNORMAL HIGH (ref 11.4–15.2)
Smear Review: NONE SEEN
aPTT: 37 seconds — ABNORMAL HIGH (ref 24–36)

## 2021-03-11 LAB — PHOSPHORUS: Phosphorus: 3.2 mg/dL (ref 2.5–4.6)

## 2021-03-11 LAB — PREPARE FRESH FROZEN PLASMA: Unit division: 0

## 2021-03-11 LAB — GLUCOSE, CAPILLARY
Glucose-Capillary: 100 mg/dL — ABNORMAL HIGH (ref 70–99)
Glucose-Capillary: 130 mg/dL — ABNORMAL HIGH (ref 70–99)
Glucose-Capillary: 115 mg/dL — ABNORMAL HIGH (ref 70–99)
Glucose-Capillary: 84 mg/dL (ref 70–99)
Glucose-Capillary: 140 mg/dL — ABNORMAL HIGH (ref 70–99)
Glucose-Capillary: 139 mg/dL — ABNORMAL HIGH (ref 70–99)
Glucose-Capillary: 169 mg/dL — ABNORMAL HIGH (ref 70–99)
Glucose-Capillary: 108 mg/dL — ABNORMAL HIGH (ref 70–99)

## 2021-03-11 LAB — BPAM FFP
Blood Product Expiration Date: 202210172359
ISSUE DATE / TIME: 202210140840
Unit Type and Rh: 600

## 2021-03-11 LAB — MAGNESIUM
Magnesium: 2.5 mg/dL — ABNORMAL HIGH (ref 1.7–2.4)
Magnesium: 2.4 mg/dL (ref 1.7–2.4)

## 2021-03-11 LAB — PREPARE RBC (CROSSMATCH)

## 2021-03-11 MED ORDER — SODIUM CHLORIDE 0.9% IV SOLUTION: Freq: Once | INTRAVENOUS | Status: AC

## 2021-03-11 MED ORDER — SIMVASTATIN 20 MG PO TABS
20.0000 mg | ORAL_TABLET | Freq: Every day | ORAL | Status: DC
  Administered 2021-03-11 – 2021-03-12 (×2): 20 mg via ORAL
  Filled 2021-03-11 (×2): qty 1

## 2021-03-11 MED ORDER — ACETAMINOPHEN 500 MG PO TABS
1000.0000 mg | ORAL_TABLET | Freq: Four times a day (QID) | ORAL | Status: AC
  Administered 2021-03-11 – 2021-03-14 (×11): 1000 mg via ORAL
  Filled 2021-03-11 (×9): qty 2

## 2021-03-11 MED ORDER — ACETAMINOPHEN 160 MG/5ML PO SOLN
1000.0000 mg | Freq: Four times a day (QID) | ORAL | Status: AC
  Administered 2021-03-13 (×2): 1000 mg via ORAL
  Filled 2021-03-11 (×2): qty 40.6

## 2021-03-11 MED ORDER — SODIUM CHLORIDE 0.9% IV SOLUTION: Freq: Once | INTRAVENOUS | Status: DC

## 2021-03-11 MED ORDER — SENNOSIDES-DOCUSATE SODIUM 8.6-50 MG PO TABS
1.0000 | ORAL_TABLET | Freq: Every day | ORAL | Status: DC
  Administered 2021-03-11 – 2021-03-28 (×9): 1 via ORAL
  Filled 2021-03-11 (×15): qty 1

## 2021-03-11 MED ORDER — METOPROLOL TARTRATE 25 MG PO TABS
25.0000 mg | ORAL_TABLET | Freq: Two times a day (BID) | ORAL | Status: DC
  Administered 2021-03-11 – 2021-03-12 (×4): 25 mg via ORAL
  Filled 2021-03-11 (×4): qty 1

## 2021-03-11 MED ORDER — OXYCODONE HCL 5 MG PO TABS
5.0000 mg | ORAL_TABLET | ORAL | Status: DC | PRN
  Administered 2021-03-11: 10 mg via ORAL
  Administered 2021-03-12: 5 mg via ORAL
  Administered 2021-03-12: 10 mg via ORAL
  Administered 2021-03-12: 5 mg via ORAL
  Administered 2021-03-13 (×2): 10 mg via ORAL
  Administered 2021-03-13: 5 mg via ORAL
  Administered 2021-03-14 – 2021-03-16 (×2): 10 mg via ORAL
  Filled 2021-03-11: qty 1
  Filled 2021-03-11 (×3): qty 2
  Filled 2021-03-11 (×2): qty 1
  Filled 2021-03-11 (×3): qty 2

## 2021-03-11 MED ORDER — INSULIN ASPART 100 UNIT/ML IJ SOLN
0.0000 [IU] | Freq: Three times a day (TID) | INTRAMUSCULAR | Status: DC
  Administered 2021-03-12 – 2021-03-13 (×4): 1 [IU] via SUBCUTANEOUS
  Administered 2021-03-13: 2 [IU] via SUBCUTANEOUS
  Administered 2021-03-13: 1 [IU] via SUBCUTANEOUS
  Administered 2021-03-14: 3 [IU] via SUBCUTANEOUS
  Administered 2021-03-14 (×2): 2 [IU] via SUBCUTANEOUS
  Administered 2021-03-15: 1 [IU] via SUBCUTANEOUS
  Administered 2021-03-15: 5 [IU] via SUBCUTANEOUS
  Administered 2021-03-15 – 2021-03-16 (×2): 1 [IU] via SUBCUTANEOUS
  Administered 2021-03-16 – 2021-03-17 (×3): 2 [IU] via SUBCUTANEOUS
  Administered 2021-03-18: 1 [IU] via SUBCUTANEOUS
  Administered 2021-03-18: 5 [IU] via SUBCUTANEOUS
  Administered 2021-03-19 (×2): 2 [IU] via SUBCUTANEOUS
  Administered 2021-03-19 – 2021-03-20 (×2): 1 [IU] via SUBCUTANEOUS
  Administered 2021-03-20: 2 [IU] via SUBCUTANEOUS
  Administered 2021-03-20: 1 [IU] via SUBCUTANEOUS
  Administered 2021-03-21: 2 [IU] via SUBCUTANEOUS
  Administered 2021-03-21: 3 [IU] via SUBCUTANEOUS
  Administered 2021-03-21 – 2021-03-24 (×8): 2 [IU] via SUBCUTANEOUS
  Administered 2021-03-24: 3 [IU] via SUBCUTANEOUS
  Administered 2021-03-24 – 2021-03-26 (×5): 2 [IU] via SUBCUTANEOUS
  Administered 2021-03-26: 1 [IU] via SUBCUTANEOUS
  Administered 2021-03-26: 2 [IU] via SUBCUTANEOUS
  Administered 2021-03-27: 1 [IU] via SUBCUTANEOUS
  Administered 2021-03-27 (×2): 2 [IU] via SUBCUTANEOUS
  Administered 2021-03-28: 1 [IU] via SUBCUTANEOUS
  Administered 2021-03-28: 2 [IU] via SUBCUTANEOUS
  Administered 2021-03-28: 1 [IU] via SUBCUTANEOUS
  Administered 2021-03-29: 2 [IU] via SUBCUTANEOUS
  Administered 2021-03-29: 1 [IU] via SUBCUTANEOUS

## 2021-03-11 MED ORDER — TRAMADOL HCL 50 MG PO TABS
50.0000 mg | ORAL_TABLET | Freq: Four times a day (QID) | ORAL | Status: DC | PRN
  Administered 2021-03-11 – 2021-03-12 (×2): 50 mg via ORAL
  Administered 2021-03-12 – 2021-03-15 (×5): 100 mg via ORAL
  Filled 2021-03-11 (×2): qty 2
  Filled 2021-03-11: qty 1
  Filled 2021-03-11 (×3): qty 2

## 2021-03-11 MED ORDER — PANTOPRAZOLE SODIUM 40 MG PO TBEC: 40.0000 mg | DELAYED_RELEASE_TABLET | Freq: Every day | ORAL | Status: DC

## 2021-03-11 MED ORDER — FUROSEMIDE 10 MG/ML IJ SOLN
20.0000 mg | Freq: Once | INTRAMUSCULAR | Status: AC
  Administered 2021-03-11: 20 mg via INTRAVENOUS
  Filled 2021-03-11: qty 2

## 2021-03-11 MED ORDER — DOCUSATE SODIUM 100 MG PO CAPS
100.0000 mg | ORAL_CAPSULE | Freq: Two times a day (BID) | ORAL | Status: DC
  Administered 2021-03-11 – 2021-03-28 (×23): 100 mg via ORAL
  Filled 2021-03-11 (×29): qty 1

## 2021-03-11 MED ORDER — INSULIN ASPART 100 UNIT/ML IJ SOLN
1.0000 [IU] | INTRAMUSCULAR | Status: DC
  Administered 2021-03-11: 2 [IU] via SUBCUTANEOUS

## 2021-03-11 MED ORDER — INSULIN DETEMIR 100 UNIT/ML ~~LOC~~ SOLN
5.0000 [IU] | Freq: Two times a day (BID) | SUBCUTANEOUS | Status: DC
  Administered 2021-03-11: 5 [IU] via SUBCUTANEOUS
  Filled 2021-03-11 (×2): qty 0.05

## 2021-03-11 NOTE — Progress Notes (Signed)
[facility] Progress Note Patient Name: [name] DOB: 03-Dec-1940 MRN: [id number]   Date of Service  03/11/2021  HPI/Events of Note  Hemoglobin 7.8 gm / dl, Platelet count 66 K.  eICU Interventions  One unit of PRBC and one unit of platelets ordered transfused.        Kerry Kass [name] 03/11/2021, 6:11 AM

## 2021-03-11 NOTE — Progress Notes (Signed)
[facility] Progress Note Patient Name: CRISOL MUECKE DOB: 1941/05/19 MRN: [id number]   Date of Service  03/11/2021  HPI/Events of Note  Patient has a left sided pneumothorax that is increasing in size and is now a moderate pneumothorax despite having a chest tube in place on that side.  eICU Interventions  PCCM ground team requested to go and evaluate the patient.        Kerry Kass Katherleen Folkes 03/11/2021, 6:28 AM

## 2021-03-11 NOTE — Progress Notes (Signed)
      [address]       [place] [address]             [phone]                 2 Days Post-Op Procedure(s) (LRB): XI ROBOTIC ASSISTED THORASCOPY-LEFT UPPER LOBE SEGMENTECTOMY (Left) INTERCOSTAL NERVE BLOCK (Left) LYMPH NODE DISSECTION (Left) THORACOTOMY MAJOR (Left)   Events: No events CT output slowed overnight _______________________________________________________________ Vitals: BP 129/66   Pulse 72   Temp 97.7 F (36.5 C) (Oral)   Resp 14   Ht 5\' 5"  (1.651 m)   Wt 72.1 kg   SpO2 95%   BMI 26.45 kg/m  Filed Weights   03/09/21 0632 03/11/21 0500  Weight: 61.2 kg 72.1 kg     - Neuro: arousable, NAD, nonfocal  - Cardiovascular: sinus  Drips: none.   CVP:  [0 mmHg-10 mmHg] 10 mmHg  - Pulm: EWOB.      ABG    Component Value Date/Time   PHART 7.418 03/10/2021 0541   PCO2ART 31.9 (L) 03/10/2021 0541   PO2ART 184 (H) 03/10/2021 0541   HCO3 20.4 03/10/2021 0541   TCO2 21 (L) 03/10/2021 0541   ACIDBASEDEF 3.0 (H) 03/10/2021 0541   O2SAT 100.0 03/10/2021 0541    - Abd: ND - Extremity: warm  .Intake/Output      10/14 0701 10/15 0700 10/15 0701 10/16 0700   P.O. 55    I.V. (mL/kg) 1624 (22.5) 0.8 (0)   Blood 1428 315   Other 20 75   NG/GT     IV Piggyback 773.3    Total Intake(mL/kg) 3900.2 (54.1) 390.8 (5.4)   Urine (mL/kg/hr) 595 (0.3)    Emesis/NG output 50    Blood     Chest Tube 1055 100   Total Output 1700 100   Net +2200.2 +290.8           _______________________________________________________________ Labs: CBC Latest Ref Rng & Units 03/11/2021 03/11/2021 03/10/2021  WBC 4.0 - 10.5 K/uL - 7.8 6.7  Hemoglobin 12.0 - 15.0 g/dL - 7.8(L) 8.2(L)  Hematocrit 36.0 - 46.0 % - 21.7(L) 23.1(L)  Platelets 150 - 400 K/uL 66(L) 66(L) 32(L)   CMP Latest Ref Rng & Units 03/11/2021 03/10/2021 03/10/2021  Glucose 70 - 99 mg/dL 125(H) 115(H) 112(H)  BUN 8 - 23 mg/dL 16 17 15   Creatinine 0.44 - 1.00 mg/dL 0.84 0.94 1.08(H)   Sodium 135 - 145 mmol/L 137 139 140  Potassium 3.5 - 5.1 mmol/L 4.4 4.5 2.8(L)  Chloride 98 - 111 mmol/L 103 104 104  CO2 22 - 32 mmol/L 27 28 26   Calcium 8.9 - 10.3 mg/dL 7.5(L) 7.6(L) 8.1(L)  Total Protein 6.5 - 8.1 g/dL 4.1(L) - -  Total Bilirubin 0.3 - 1.2 mg/dL 0.8 - -  Alkaline Phos 38 - 126 U/L 49 - -  AST 15 - 41 U/L 60(H) - -  ALT 0 - 44 U/L 30 - -    CXR: Small L pneumoT  _______________________________________________________________  Assessment and Plan: POD 2 s/p LUL segmentectomy and thoracotomy.  Neuro: nonfocal.  Pain control as need CV: off pressors.  Starting home meds for BP Pulm: continue pulm hygiene.  Will keep CT for now.  pneumoT likely residual space from lung resection Renal: creat stable.  diuresing GI: advancing diet Heme: stable.  Will watch Plts ID: [email or web address] Endo: SSI Dispo: continue ICU care   Lajuana Matte 03/11/2021 8:54 AM

## 2021-03-11 NOTE — Progress Notes (Signed)
NAME:  [name], MRN:  [id number], DOB:  12/08/1940, LOS: 2 ADMISSION DATE:  03/09/2021, CONSULTATION DATE:  03/09/2021 REFERRING MD:  Dr. Roxan Hockey, CHIEF COMPLAINT:  Shock  BRIEF HISTORY:    [name] is a 80 y.o. female with a pertinent PMH of exocrine pancreatic insuffiencey, DM, HTN, HLD/CAD, endometrial adenocarcinoma s/p hysterectomy (4163) complicated by provoked DVT, who presented resection of left upper lobe hypermetabolic lung nodule 2/2 to presumed recurrence of endometrial adenocarcinoma.   HISTORY OF PRESENT ILLNESS   [name] is a 80 y.o. female with a pertinent PMH of exocrine pancreatic insuffiencey, DM, HTN, HLD/CAD, endometrial adenocarcinoma s/p hysterectomy (8453) complicated by provoked DVT, who presented VAT resection of left upper lobe hypermetabolic lung nodule 2/2 to presumed recurrence of endometrial adenocarcinoma.   Her hospital course was complicated by intraoperative hemorrhage of roughly 3 liters with hemodynamic instability and resulting PEA arrest with ROSC in roughly 3-5 minutes. The surgery was converted to an open thoracotomy to achieve hemostasis and a chest tube was placed in the left hemithorax. The chest tube has drained roughly 800 cc of sanguinous fluid since returning to the ICU for an estimated total blood loss of roughly 3.8 L. Intraoperative ABG was performed with a critically low ph of 6.89 that has since improved to 7.2 with pressors and 4 units PRBC.   SIGNIFICANT PAST MEDICAL HISTORY   exocrine pancreatic insuffiencey, DM, HTN, HLD/CAD, endometrial adenocarcinoma s/p hysterectomy (6468) complicated by provoked DVT  SIGNIFICANT EVENTS:  10/13 - Admitted for VATS of left upper lobe lung nodule.  10/13 - Rt femoral CVC placed  10/14 extubated, received 4 platelets, 2 RBC 10/15> remove Aline, fem CVC, off pressors   CULTURES:  N/a  ANTIBIOTICS:  cefazolin  LINES/TUBES:  10/13 Arterial line - Rt radial   10/13 CVC - Rt IJ 10/13 - Lt Chest tube 10/13 - ETT - 7 mm  10/13 CVC - Rt femoral    Interval history:  2 units of platelets and 1 more pRBCs overnight. Off pressors this morning. She has a mild sore throat that is improving. She has tolerated juice and wants to try eating more. She has occasional SOB.  OBJECTIVE:   BP (!) 143/59   Pulse 95   Temp 97.7 F (36.5 C) (Oral)   Resp 20   Ht 5\' 5"  (1.651 m)   Wt 72.1 kg   SpO2 98%   BMI 26.45 kg/m   I/O last 3 completed shifts: In: 22521.6 [P.O.:55; I.V.:3653.5; Blood:17369.7; Other:20; NG/GT:400; IV Piggyback:1023.4] Out: 0321 [Urine:1270; Emesis/NG output:200; Chest Tube:1575]  CVP:  [0 mmHg-10 mmHg] 10 mmHg  Vent Mode: CPAP;PSV FiO2 (%):  [40 %] 40 % PEEP:  [5 cmH20] 5 cmH20 Pressure Support:  [8 cmH20] 8 cmH20  PHYSICAL EXAM: General: ill appearing woman lying in bed in NAD HEENT:  [place]/AT, eyes anicteric Cardiovascular:  S1S2, RRR Lungs: Significantly decreased, thickened bloody output from chest tube. Coarse rhales bilaterally, prolonged exhalation. Abdomen:  soft, NT, ND Musculoskeletal: edema in arms. Good perfusion distal to R radial a-line.  GU: foley draining clear yellow urine. Skin: pallor, warm, dry. No rashes. No erythema around CVCs or Aline. Neuro: arouses easily to verbal stimulation, answering questions appropriately, moving all extremities but globally weak.  Platelets 66 INR 1.6 D-dimer 12.2 Fibrinogen 271 H/H 7.8/21.7 AST 60 ALT 30 Cr 0.84  CXR personally reviewed 10/15> improving pleural effusion on the left, apical pneumothorax, chest tube in appropriate position, mild bilateral lower lobe opacities  likely pulm edema.  RESOLVED PROBLEM LIST   AGMA hypokalemia  ASSESSMENT AND PLAN    Acute hypoxic and hypercarbic respiratory failure postoperatively Postop hemothorax Acute pulmonary edema due to IVF & blood transfusions -Con't chest tube per TCTS management. Concern that is it clotting  and may not adequately evacuate pneumothorax. May need a second pigtail to address this. -pulmonary hygiene -wean supplemental O2 as able. -lasix 20mg  today -duonebs PRN -Wean FiO2 as able to maintain SpO2 greater than 90% -mobility as able.  Acute blood loss anemia Hemorraghic shock post segmentectomy of a left upper lobe nodule with resulting intraoperative hemorrhage and hemorrhagic shock.  MTP completed on 10/13, improved significantly today. About 2L bloody output total out from chest tube since yesterday.   Consumptive thrombocytopenia Mild consumptive coagulopathy- improved -RBC and platelets transfusing from overnight -recheck CBC later today -no longer requiring pressors  HTN -can resume PTA metoprolol  HLD -resume PTA statin  Presumed metastatic endometrial adenocarcinoma s/p resection -surgical pathology pending  Secondary insulin dependant DM, controlled hyperglycemia -transition off insulin gtt today -advance diet -basal bolus insulin -goal BG 140-180 -hold PTA jardiance  Transaminase elevation, resolving, due to shock -no additional monitoring needed  Best Practice / Goals of Care / Disposition.   DVT PROPHYLAXIS: none  GI: PPI  NUTRITION: diabetic  MOBILITY: ambulate with assist FAMILY DISCUSSIONS: daughter updated 10/14 at bedside DISPOSITION ICU  LABS  Glucose Recent Labs  Lab 03/11/21 0111 03/11/21 0216 03/11/21 0323 03/11/21 0449 03/11/21 0551 03/11/21 0647  GLUCAP 100* 130* 115* 84 140* 139*     BMET Recent Labs  Lab 03/10/21 1403 03/10/21 2326 03/11/21 0450  NA 140 139 137  K 2.8* 4.5 4.4  CL 104 104 103  CO2 26 28 27   BUN 15 17 16   CREATININE 1.08* 0.94 0.84  GLUCOSE 112* 115* 125*     Liver Enzymes Recent Labs  Lab 03/09/21 1800 03/10/21 0539 03/11/21 0450  AST 86* 149* 60*  ALT 41 52* 30  ALKPHOS 49 49 49  BILITOT 2.0* 1.1 0.8  ALBUMIN 2.5* 2.3* 2.3*     Electrolytes Recent Labs  Lab 03/10/21 1403  03/10/21 2326 03/11/21 0450  CALCIUM 8.1* 7.6* 7.5*  MG 1.3* 2.5* 2.4  PHOS 2.8 3.2  --      CBC Recent Labs  Lab 03/10/21 1729 03/10/21 2326 03/11/21 0450  WBC 7.6 6.7 7.8  HGB 8.1* 8.2* 7.8*  HCT 22.3* 23.1* 21.7*  PLT 52* 32* 66*  66*       Julian Hy, DO 03/11/21 7:47 AM [facility]

## 2021-03-11 NOTE — Progress Notes (Signed)
[facility] Progress Note Patient Name: [name] DOB: March 19, 1941 MRN: [id number]   Date of Service  03/11/2021  HPI/Events of Note  Patient with thrombocytopenia and increased blood output from her chest tube. Platelet count was 52 K and after 1 unit of platelets transfused platelet count is 32 K.  eICU Interventions  Transfuse 2 units of platelets. Next scheduled H & H will be drawn early.        Kerry Kass Shanetra Blumenstock 03/11/2021, 12:45 AM

## 2021-03-12 ENCOUNTER — Inpatient Hospital Stay (HOSPITAL_COMMUNITY): Payer: PPO

## 2021-03-12 DIAGNOSIS — J939 Pneumothorax, unspecified: Secondary | ICD-10-CM

## 2021-03-12 DIAGNOSIS — J9601 Acute respiratory failure with hypoxia: Secondary | ICD-10-CM | POA: Diagnosis not present

## 2021-03-12 DIAGNOSIS — J942 Hemothorax: Secondary | ICD-10-CM | POA: Diagnosis not present

## 2021-03-12 DIAGNOSIS — D696 Thrombocytopenia, unspecified: Secondary | ICD-10-CM

## 2021-03-12 LAB — PREPARE PLATELET PHERESIS
Unit division: 0
Unit division: 0
Unit division: 0

## 2021-03-12 LAB — CBC WITH DIFFERENTIAL/PLATELET
Abs Immature Granulocytes: 0.03 10*3/uL (ref 0.00–0.07)
Abs Immature Granulocytes: 0.05 10*3/uL (ref 0.00–0.07)
Abs Immature Granulocytes: 0.07 10*3/uL (ref 0.00–0.07)
Basophils Absolute: 0 10*3/uL (ref 0.0–0.1)
Basophils Absolute: 0 10*3/uL (ref 0.0–0.1)
Basophils Absolute: 0 10*3/uL (ref 0.0–0.1)
Basophils Relative: 0 %
Basophils Relative: 0 %
Basophils Relative: 0 %
Eosinophils Absolute: 0.1 10*3/uL (ref 0.0–0.5)
Eosinophils Absolute: 0.1 10*3/uL (ref 0.0–0.5)
Eosinophils Absolute: 0.2 10*3/uL (ref 0.0–0.5)
Eosinophils Relative: 2 %
Eosinophils Relative: 2 %
Eosinophils Relative: 2 %
HCT: 25.5 % — ABNORMAL LOW (ref 36.0–46.0)
HCT: 25.9 % — ABNORMAL LOW (ref 36.0–46.0)
HCT: 25.8 % — ABNORMAL LOW (ref 36.0–46.0)
Hemoglobin: 8.5 g/dL — ABNORMAL LOW (ref 12.0–15.0)
Hemoglobin: 8.7 g/dL — ABNORMAL LOW (ref 12.0–15.0)
Hemoglobin: 8.6 g/dL — ABNORMAL LOW (ref 12.0–15.0)
Immature Granulocytes: 1 %
Immature Granulocytes: 1 %
Immature Granulocytes: 1 %
Lymphocytes Relative: 7 %
Lymphocytes Relative: 7 %
Lymphocytes Relative: 5 %
Lymphs Abs: 0.4 10*3/uL — ABNORMAL LOW (ref 0.7–4.0)
Lymphs Abs: 0.5 10*3/uL — ABNORMAL LOW (ref 0.7–4.0)
Lymphs Abs: 0.4 10*3/uL — ABNORMAL LOW (ref 0.7–4.0)
MCH: 29.3 pg (ref 26.0–34.0)
MCH: 29.6 pg (ref 26.0–34.0)
MCH: 29.9 pg (ref 26.0–34.0)
MCHC: 33.3 g/dL (ref 30.0–36.0)
MCHC: 33.6 g/dL (ref 30.0–36.0)
MCHC: 33.3 g/dL (ref 30.0–36.0)
MCV: 87.9 fL (ref 80.0–100.0)
MCV: 88.1 fL (ref 80.0–100.0)
MCV: 89.6 fL (ref 80.0–100.0)
Monocytes Absolute: 0.6 10*3/uL (ref 0.1–1.0)
Monocytes Absolute: 0.8 10*3/uL (ref 0.1–1.0)
Monocytes Absolute: 0.9 10*3/uL (ref 0.1–1.0)
Monocytes Relative: 10 %
Monocytes Relative: 12 %
Monocytes Relative: 11 %
Neutro Abs: 4.9 10*3/uL (ref 1.7–7.7)
Neutro Abs: 5.3 10*3/uL (ref 1.7–7.7)
Neutro Abs: 6.4 10*3/uL (ref 1.7–7.7)
Neutrophils Relative %: 80 %
Neutrophils Relative %: 78 %
Neutrophils Relative %: 81 %
Platelets: 45 10*3/uL — ABNORMAL LOW (ref 150–400)
Platelets: 55 10*3/uL — ABNORMAL LOW (ref 150–400)
Platelets: 52 10*3/uL — ABNORMAL LOW (ref 150–400)
RBC: 2.9 MIL/uL — ABNORMAL LOW (ref 3.87–5.11)
RBC: 2.94 MIL/uL — ABNORMAL LOW (ref 3.87–5.11)
RBC: 2.88 MIL/uL — ABNORMAL LOW (ref 3.87–5.11)
RDW: 17.1 % — ABNORMAL HIGH (ref 11.5–15.5)
RDW: 16.6 % — ABNORMAL HIGH (ref 11.5–15.5)
RDW: 16.9 % — ABNORMAL HIGH (ref 11.5–15.5)
Smear Review: DECREASED
WBC: 6.1 10*3/uL (ref 4.0–10.5)
WBC: 6.8 10*3/uL (ref 4.0–10.5)
WBC: 7.9 10*3/uL (ref 4.0–10.5)
nRBC: 0.3 % — ABNORMAL HIGH (ref 0.0–0.2)
nRBC: 0.3 % — ABNORMAL HIGH (ref 0.0–0.2)
nRBC: 0.4 % — ABNORMAL HIGH (ref 0.0–0.2)

## 2021-03-12 LAB — COMPREHENSIVE METABOLIC PANEL
ALT: 23 U/L (ref 0–44)
AST: 34 U/L (ref 15–41)
Albumin: 2.3 g/dL — ABNORMAL LOW (ref 3.5–5.0)
Alkaline Phosphatase: 59 U/L (ref 38–126)
Anion gap: 6 (ref 5–15)
BUN: 19 mg/dL (ref 8–23)
CO2: 29 mmol/L (ref 22–32)
Calcium: 7.7 mg/dL — ABNORMAL LOW (ref 8.9–10.3)
Chloride: 102 mmol/L (ref 98–111)
Creatinine, Ser: 0.77 mg/dL (ref 0.44–1.00)
GFR, Estimated: >60 mL/min (ref >60)
Glucose, Bld: 129 mg/dL — ABNORMAL HIGH (ref 70–99)
Potassium: 4.6 mmol/L (ref 3.5–5.1)
Sodium: 137 mmol/L (ref 135–145)
Total Bilirubin: 0.9 mg/dL (ref 0.3–1.2)
Total Protein: 4.4 g/dL — ABNORMAL LOW (ref 6.5–8.1)

## 2021-03-12 LAB — BPAM PLATELET PHERESIS
Blood Product Expiration Date: 202210162359
Blood Product Expiration Date: 202210162359
Blood Product Expiration Date: 202210172359
ISSUE DATE / TIME: 202210150058
ISSUE DATE / TIME: 202210150058
ISSUE DATE / TIME: 202210150632
Unit Type and Rh: 5100
Unit Type and Rh: 600
Unit Type and Rh: 600

## 2021-03-12 LAB — GLUCOSE, CAPILLARY
Glucose-Capillary: 123 mg/dL — ABNORMAL HIGH (ref 70–99)
Glucose-Capillary: 137 mg/dL — ABNORMAL HIGH (ref 70–99)
Glucose-Capillary: 134 mg/dL — ABNORMAL HIGH (ref 70–99)
Glucose-Capillary: 131 mg/dL — ABNORMAL HIGH (ref 70–99)

## 2021-03-12 MED ORDER — SODIUM CHLORIDE 0.9 % IV SOLN
INTRAVENOUS | Status: DC | PRN
Start: 2021-03-12 — End: 2021-03-29
  Administered 2021-03-12: 1000 mL via INTRAVENOUS

## 2021-03-12 MED ORDER — FUROSEMIDE 10 MG/ML IJ SOLN
20.0000 mg | Freq: Four times a day (QID) | INTRAMUSCULAR | Status: AC
  Administered 2021-03-12 (×2): 20 mg via INTRAVENOUS
  Filled 2021-03-12 (×2): qty 2

## 2021-03-12 MED ORDER — SODIUM CHLORIDE 0.9% IV SOLUTION: Freq: Once | INTRAVENOUS | Status: DC

## 2021-03-12 NOTE — Progress Notes (Signed)
      [address]       [place] [address]             [phone]                 3 Days Post-Op Procedure(s) (LRB): XI ROBOTIC ASSISTED THORASCOPY-LEFT UPPER LOBE SEGMENTECTOMY (Left) INTERCOSTAL NERVE BLOCK (Left) LYMPH NODE DISSECTION (Left) THORACOTOMY MAJOR (Left)   Events: No events Up to chair yesterday _______________________________________________________________ Vitals: BP (!) 158/92   Pulse 95   Temp 98 F (36.7 C) (Oral)   Resp (!) 27   Ht 5\' 5"  (1.651 m)   Wt 72.1 kg   SpO2 99%   BMI 26.45 kg/m  Filed Weights   03/09/21 0632 03/11/21 0500  Weight: 61.2 kg 72.1 kg     - Neuro: arousable, NAD, follows commands  - Cardiovascular: sinus  Drips: none.   CVP:  [10 mmHg] 10 mmHg  - Pulm: EWOB.      ABG    Component Value Date/Time   PHART 7.418 03/10/2021 0541   PCO2ART 31.9 (L) 03/10/2021 0541   PO2ART 184 (H) 03/10/2021 0541   HCO3 20.4 03/10/2021 0541   TCO2 21 (L) 03/10/2021 0541   ACIDBASEDEF 3.0 (H) 03/10/2021 0541   O2SAT 100.0 03/10/2021 0541    - Abd: ND - Extremity: warm  .Intake/Output      10/15 0701 10/16 0700 10/16 0701 10/17 0700   P.O. 357    I.V. (mL/kg) 60.3 (0.8)    Blood 505 315   Other     IV Piggyback     Total Intake(mL/kg) 922.3 (12.8) 315 (4.4)   Urine (mL/kg/hr) 1315 (0.8)    Emesis/NG output     Chest Tube 800    Total Output 2115    Net -1192.7 +315           _______________________________________________________________ Labs: CBC Latest Ref Rng & Units 03/12/2021 03/11/2021 03/11/2021  WBC 4.0 - 10.5 K/uL 6.1 7.8 8.5  Hemoglobin 12.0 - 15.0 g/dL 8.5(L) 9.4(L) 8.9(L)  Hematocrit 36.0 - 46.0 % 25.5(L) 27.4(L) 26.4(L)  Platelets 150 - 400 K/uL 45(L) 64(L) 74(L)   CMP Latest Ref Rng & Units 03/12/2021 03/11/2021 03/10/2021  Glucose 70 - 99 mg/dL 129(H) 125(H) 115(H)  BUN 8 - 23 mg/dL 19 16 17   Creatinine 0.44 - 1.00 mg/dL 0.77 0.84 0.94  Sodium 135 - 145 mmol/L 137 137 139   Potassium 3.5 - 5.1 mmol/L 4.6 4.4 4.5  Chloride 98 - 111 mmol/L 102 103 104  CO2 22 - 32 mmol/L 29 27 28   Calcium 8.9 - 10.3 mg/dL 7.7(L) 7.5(L) 7.6(L)  Total Protein 6.5 - 8.1 g/dL 4.4(L) 4.1(L) -  Total Bilirubin 0.3 - 1.2 mg/dL 0.9 0.8 -  Alkaline Phos 38 - 126 U/L 59 49 -  AST 15 - 41 U/L 34 60(H) -  ALT 0 - 44 U/L 23 30 -    CXR: Small L pneumoT stable  _______________________________________________________________  Assessment and Plan: POD 3 s/p LUL segmentectomy and thoracotomy.  Neuro: nonfocal.  Pain control as need CV: on home meds for BP Pulm: continue pulm hygiene.  Will keep CT for now.  pneumoT likely residual space from lung resection Renal: creat stable.  diuresing GI: advancing diet Heme: stable.  Transfusing plts today ID: afebrile Endo: SSI Dispo: continue ICU care   [place] 03/12/2021 9:09 AM

## 2021-03-12 NOTE — Progress Notes (Signed)
NAME:  [name], MRN:  [id number], DOB:  11/14/1940, LOS: 3 ADMISSION DATE:  03/09/2021, CONSULTATION DATE:  03/09/2021 REFERRING MD:  Dr. Roxan Hockey, CHIEF COMPLAINT:  Shock  BRIEF HISTORY:    [name] is a 80 y.o. female with a pertinent PMH of exocrine pancreatic insuffiencey, DM, HTN, HLD/CAD, endometrial adenocarcinoma s/p hysterectomy (0263) complicated by provoked DVT, who presented resection of left upper lobe hypermetabolic lung nodule 2/2 to presumed recurrence of endometrial adenocarcinoma.   HISTORY OF PRESENT ILLNESS   [name] is a 80 y.o. female with a pertinent PMH of exocrine pancreatic insuffiencey, DM, HTN, HLD/CAD, endometrial adenocarcinoma s/p hysterectomy (7858) complicated by provoked DVT, who presented VAT resection of left upper lobe hypermetabolic lung nodule 2/2 to presumed recurrence of endometrial adenocarcinoma.   Her hospital course was complicated by intraoperative hemorrhage of roughly 3 liters with hemodynamic instability and resulting PEA arrest with ROSC in roughly 3-5 minutes. The surgery was converted to an open thoracotomy to achieve hemostasis and a chest tube was placed in the left hemithorax. The chest tube has drained roughly 800 cc of sanguinous fluid since returning to the ICU for an estimated total blood loss of roughly 3.8 L. Intraoperative ABG was performed with a critically low ph of 6.89 that has since improved to 7.2 with pressors and 4 units PRBC.   SIGNIFICANT PAST MEDICAL HISTORY   exocrine pancreatic insuffiencey, DM, HTN, HLD/CAD, endometrial adenocarcinoma s/p hysterectomy (8502) complicated by provoked DVT  SIGNIFICANT EVENTS:  10/13 - Admitted for VATS of left upper lobe lung nodule.  10/13 - Rt femoral CVC placed  10/14 extubated, received 4 platelets, 2 RBC 10/15> remove Aline, fem CVC, off pressors   CULTURES:  N/a  ANTIBIOTICS:  cefazolin  LINES/TUBES:  10/13 Arterial line - Rt radial >  removed 10/15 10/13 CVC - Rt IJ 10/13 - Lt Chest tube 10/13 - ETT - 7 mm > removed 10/14 10/13 CVC - Rt femoral > removed 10/15 10/13 foley  Interval history:  Today she feels very weak. Was up to chair yesterday. Chest tube output.   OBJECTIVE:   BP (!) 149/61   Pulse 74   Temp 97.7 F (36.5 C) (Oral)   Resp 13   Ht 5\' 5"  (1.651 m)   Wt 72.1 kg   SpO2 97%   BMI 26.45 kg/m   I/O last 3 completed shifts: In: 3008.6 [P.O.:357; I.V.:1254.2; Blood:1105; IV Piggyback:292.5] Out: 2835 [Urine:1680; Chest Tube:1155]  CVP:  [10 mmHg] 10 mmHg     PHYSICAL EXAM: General: ill appearing woman sitting up in bed eating breakfast HEENT:  [place]/AT, eyes anicteric Cardiovascular:  S1S2, RRR Lungs: ongoing bloody output from chest tube, no air leak.  Rales improving bilaterally.  Reassured right-sided breath sounds. Abdomen:  soft, NT, ND Musculoskeletal: ongoing arm edema, mild leg edema, no cynosis GU: foley catheter with yellow urine Skin: pallor, warm, dry, no rashes Neuro: Awake and alert, moving all extremities but too weak to lift a full coffee cup. Able to feed herself.  Platelets 45 H/H 8.5/25.5 Cr 0.77  CXR personally reviewed 10/15> enlarging R effusion, persistent left pneumothorax, chest tube remains in place. Opacity in left base ongoing  RESOLVED PROBLEM LIST   AGMA Hypokalemia Elevated transaminases  ASSESSMENT AND PLAN    Acute hypoxic and hypercarbic respiratory failure postoperatively Postop hemopneumothorax Acute pulmonary edema due to IVF & blood transfusions -Con't chest tube per TCTS management, -20 cm H2O.  Pneumothorax is stable and fairly small  and there is no current indication to place another tube. - Pulmonary hygiene, out of bed mobility as able -Wean supplemental oxygen as able - 2 doses of Lasix today - DuoNebs as needed  Acute blood loss anemia Hemorraghic shock post segmentectomy of a left upper lobe nodule with resulting intraoperative  hemorrhage and hemorrhagic shock.  MTP completed on 10/13, improved significantly today. About 2L bloody output total out from chest tube since yesterday.   Consumptive thrombocytopenia Mild consumptive coagulopathy- improved -Additional platelet transfusion today - Hemoglobin continues to drop may need additional unit PRBCs - Continue to monitor CBC every 12h  HTN -Continue PTA metoprolol  HLD -Continue PTA statin  Presumed metastatic endometrial adenocarcinoma s/p resection -surgical pathology pending  Secondary insulin dependant DM, controlled hyperglycemia -Continue basal bolus insulin - Goal BG 140-180 -SSI as needed -Hold PTA Jardiance    Best Practice / Goals of Care / Disposition.   DVT PROPHYLAXIS: SCD GI: PPI  NUTRITION: diabetic  MOBILITY: ambulate with assist FAMILY DISCUSSIONS: daughter updated 10/14 at bedside DISPOSITION ICU  LABS  Glucose Recent Labs  Lab 03/11/21 0323 03/11/21 0449 03/11/21 0551 03/11/21 0647 03/11/21 1121 03/11/21 1628  GLUCAP 115* 84 140* 139* 169* 108*     BMET Recent Labs  Lab 03/10/21 2326 03/11/21 0450 03/12/21 0245  NA 139 137 137  K 4.5 4.4 4.6  CL 104 103 102  CO2 28 27 29   BUN 17 16 19   CREATININE 0.94 0.84 0.77  GLUCOSE 115* 125* 129*     Liver Enzymes Recent Labs  Lab 03/10/21 0539 03/11/21 0450 03/12/21 0245  AST 149* 60* 34  ALT 52* 30 23  ALKPHOS 49 49 59  BILITOT 1.1 0.8 0.9  ALBUMIN 2.3* 2.3* 2.3*     Electrolytes Recent Labs  Lab 03/10/21 1403 03/10/21 2326 03/11/21 0450 03/12/21 0245  CALCIUM 8.1* 7.6* 7.5* 7.7*  MG 1.3* 2.5* 2.4  --   PHOS 2.8 3.2  --   --      CBC Recent Labs  Lab 03/11/21 1123 03/11/21 1739 03/12/21 0245  WBC 8.5 7.8 6.1  HGB 8.9* 9.4* 8.5*  HCT 26.4* 27.4* 25.5*  PLT 74* 64* 45*       Julian Hy, DO 03/12/21 8:40 AM [facility]

## 2021-03-12 NOTE — Progress Notes (Signed)
PT reports difficulty swallowing while eating dinner; requests chopped/dysphagia diet going onwards.

## 2021-03-13 ENCOUNTER — Other Ambulatory Visit: Payer: Self-pay

## 2021-03-13 DIAGNOSIS — J90 Pleural effusion, not elsewhere classified: Secondary | ICD-10-CM

## 2021-03-13 DIAGNOSIS — J9601 Acute respiratory failure with hypoxia: Secondary | ICD-10-CM | POA: Diagnosis not present

## 2021-03-13 DIAGNOSIS — J810 Acute pulmonary edema: Secondary | ICD-10-CM

## 2021-03-13 DIAGNOSIS — D62 Acute posthemorrhagic anemia: Secondary | ICD-10-CM | POA: Diagnosis not present

## 2021-03-13 LAB — TYPE AND SCREEN
ABO/RH(D): O POS
Antibody Screen: NEGATIVE
Unit division: 0
Unit division: 0
Unit division: 0
Unit division: 0
Unit division: 0
Unit division: 0
Unit division: 0
Unit division: 0
Unit division: 0
Unit division: 0
Unit division: 0
Unit division: 0
Unit division: 0
Unit division: 0
Unit division: 0
Unit division: 0
Unit division: 0
Unit division: 0

## 2021-03-13 LAB — BPAM RBC
Blood Product Expiration Date: 202210202359
Blood Product Expiration Date: 202211032359
Blood Product Expiration Date: 202211042359
Blood Product Expiration Date: 202211042359
Blood Product Expiration Date: 202211042359
Blood Product Expiration Date: 202211042359
Blood Product Expiration Date: 202211042359
Blood Product Expiration Date: 202211042359
Blood Product Expiration Date: 202211052359
Blood Product Expiration Date: 202211052359
Blood Product Expiration Date: 202211072359
Blood Product Expiration Date: 202211072359
Blood Product Expiration Date: 202211072359
Blood Product Expiration Date: 202211072359
Blood Product Expiration Date: 202211072359
Blood Product Expiration Date: 202211072359
Blood Product Expiration Date: 202211072359
Blood Product Expiration Date: 202211072359
ISSUE DATE / TIME: 202210131145
ISSUE DATE / TIME: 202210131145
ISSUE DATE / TIME: 202210131145
ISSUE DATE / TIME: 202210131145
ISSUE DATE / TIME: 202210131457
ISSUE DATE / TIME: 202210131731
ISSUE DATE / TIME: 202210131731
ISSUE DATE / TIME: 202210132243
ISSUE DATE / TIME: 202210140044
ISSUE DATE / TIME: 202210140126
ISSUE DATE / TIME: 202210140126
ISSUE DATE / TIME: 202210140228
ISSUE DATE / TIME: 202210140228
ISSUE DATE / TIME: 202210140428
ISSUE DATE / TIME: 202210140428
ISSUE DATE / TIME: 202210140549
ISSUE DATE / TIME: 202210141527
ISSUE DATE / TIME: 202210150632
Unit Type and Rh: 5100
Unit Type and Rh: 5100
Unit Type and Rh: 5100
Unit Type and Rh: 5100
Unit Type and Rh: 5100
Unit Type and Rh: 5100
Unit Type and Rh: 5100
Unit Type and Rh: 5100
Unit Type and Rh: 5100
Unit Type and Rh: 5100
Unit Type and Rh: 5100
Unit Type and Rh: 5100
Unit Type and Rh: 5100
Unit Type and Rh: 5100
Unit Type and Rh: 5100
Unit Type and Rh: 5100
Unit Type and Rh: 5100
Unit Type and Rh: 5100

## 2021-03-13 LAB — PREPARE PLATELET PHERESIS
Unit division: 0
Unit division: 0

## 2021-03-13 LAB — GLUCOSE, CAPILLARY
Glucose-Capillary: 129 mg/dL — ABNORMAL HIGH (ref 70–99)
Glucose-Capillary: 132 mg/dL — ABNORMAL HIGH (ref 70–99)
Glucose-Capillary: 172 mg/dL — ABNORMAL HIGH (ref 70–99)
Glucose-Capillary: 173 mg/dL — ABNORMAL HIGH (ref 70–99)

## 2021-03-13 LAB — COMPREHENSIVE METABOLIC PANEL
ALT: 20 U/L (ref 0–44)
AST: 24 U/L (ref 15–41)
Albumin: 2.4 g/dL — ABNORMAL LOW (ref 3.5–5.0)
Alkaline Phosphatase: 72 U/L (ref 38–126)
Anion gap: 6 (ref 5–15)
BUN: 17 mg/dL (ref 8–23)
CO2: 31 mmol/L (ref 22–32)
Calcium: 7.9 mg/dL — ABNORMAL LOW (ref 8.9–10.3)
Chloride: 97 mmol/L — ABNORMAL LOW (ref 98–111)
Creatinine, Ser: 0.75 mg/dL (ref 0.44–1.00)
GFR, Estimated: >60 mL/min (ref >60)
Glucose, Bld: 138 mg/dL — ABNORMAL HIGH (ref 70–99)
Potassium: 4.2 mmol/L (ref 3.5–5.1)
Sodium: 134 mmol/L — ABNORMAL LOW (ref 135–145)
Total Bilirubin: 1.2 mg/dL (ref 0.3–1.2)
Total Protein: 4.6 g/dL — ABNORMAL LOW (ref 6.5–8.1)

## 2021-03-13 LAB — SURGICAL PATHOLOGY

## 2021-03-13 LAB — CBC WITH DIFFERENTIAL/PLATELET
Abs Immature Granulocytes: 0 10*3/uL (ref 0.00–0.07)
Basophils Absolute: 0 10*3/uL (ref 0.0–0.1)
Basophils Relative: 0 %
Eosinophils Absolute: 0.2 10*3/uL (ref 0.0–0.5)
Eosinophils Relative: 2 %
HCT: 25.8 % — ABNORMAL LOW (ref 36.0–46.0)
Hemoglobin: 8.3 g/dL — ABNORMAL LOW (ref 12.0–15.0)
Lymphocytes Relative: 2 %
Lymphs Abs: 0.2 10*3/uL — ABNORMAL LOW (ref 0.7–4.0)
MCH: 29.3 pg (ref 26.0–34.0)
MCHC: 32.2 g/dL (ref 30.0–36.0)
MCV: 91.2 fL (ref 80.0–100.0)
Monocytes Absolute: 0.4 10*3/uL (ref 0.1–1.0)
Monocytes Relative: 5 %
Neutro Abs: 7.4 10*3/uL (ref 1.7–7.7)
Neutrophils Relative %: 91 %
Platelets: 58 10*3/uL — ABNORMAL LOW (ref 150–400)
RBC: 2.83 MIL/uL — ABNORMAL LOW (ref 3.87–5.11)
RDW: 16.6 % — ABNORMAL HIGH (ref 11.5–15.5)
Smear Review: DECREASED
WBC Morphology: INCREASED
WBC: 8.1 10*3/uL (ref 4.0–10.5)
nRBC: 0 % (ref 0.0–0.2)

## 2021-03-13 LAB — BPAM PLATELET PHERESIS
Blood Product Expiration Date: 202210162359
Blood Product Expiration Date: 202210172359
ISSUE DATE / TIME: 202210160841
ISSUE DATE / TIME: 202210161801
Unit Type and Rh: 600
Unit Type and Rh: 6200

## 2021-03-13 MED ORDER — FUROSEMIDE 40 MG PO TABS
40.0000 mg | ORAL_TABLET | Freq: Every day | ORAL | Status: DC
  Administered 2021-03-13 – 2021-03-22 (×10): 40 mg via ORAL
  Filled 2021-03-13 (×10): qty 1

## 2021-03-13 MED ORDER — SIMVASTATIN 20 MG PO TABS
20.0000 mg | ORAL_TABLET | Freq: Every day | ORAL | Status: DC
  Administered 2021-03-13 – 2021-03-28 (×16): 20 mg via ORAL
  Filled 2021-03-13 (×16): qty 1

## 2021-03-13 MED ORDER — PANCRELIPASE (LIP-PROT-AMYL) 12000-38000 UNITS PO CPEP
12000.0000 [IU] | ORAL_CAPSULE | Freq: Three times a day (TID) | ORAL | Status: DC
  Administered 2021-03-13 – 2021-03-29 (×48): 12000 [IU] via ORAL
  Filled 2021-03-13 (×47): qty 1

## 2021-03-13 MED ORDER — MELATONIN 5 MG PO TABS
10.0000 mg | ORAL_TABLET | Freq: Every evening | ORAL | Status: DC | PRN
  Administered 2021-03-13 – 2021-03-16 (×3): 10 mg via ORAL
  Filled 2021-03-13 (×4): qty 2

## 2021-03-13 MED ORDER — EMPAGLIFLOZIN 10 MG PO TABS
10.0000 mg | ORAL_TABLET | Freq: Every day | ORAL | Status: DC
  Administered 2021-03-14 – 2021-03-29 (×16): 10 mg via ORAL
  Filled 2021-03-13 (×18): qty 1

## 2021-03-13 MED ORDER — SUCRALFATE 1 G PO TABS
1.0000 g | ORAL_TABLET | Freq: Two times a day (BID) | ORAL | Status: DC
  Administered 2021-03-13 – 2021-03-29 (×32): 1 g via ORAL
  Filled 2021-03-13 (×34): qty 1

## 2021-03-13 MED ORDER — LABETALOL HCL 5 MG/ML IV SOLN
10.0000 mg | INTRAVENOUS | Status: DC | PRN
  Administered 2021-03-13: 10 mg via INTRAVENOUS
  Filled 2021-03-13 (×2): qty 4

## 2021-03-13 MED ORDER — POTASSIUM CHLORIDE CRYS ER 20 MEQ PO TBCR
20.0000 meq | EXTENDED_RELEASE_TABLET | Freq: Every day | ORAL | Status: DC
  Administered 2021-03-13 – 2021-03-14 (×2): 20 meq via ORAL
  Filled 2021-03-13 (×2): qty 1

## 2021-03-13 MED ORDER — METOPROLOL TARTRATE 50 MG PO TABS
50.0000 mg | ORAL_TABLET | Freq: Two times a day (BID) | ORAL | Status: DC
  Administered 2021-03-13 – 2021-03-29 (×33): 50 mg via ORAL
  Filled 2021-03-13 (×33): qty 1

## 2021-03-13 NOTE — Progress Notes (Signed)
NAME:  [name], MRN:  [id number], DOB:  [birth year], LOS: 4 ADMISSION DATE:  03/09/2021, CONSULTATION DATE:  03/09/2021 REFERRING MD:  Dr. Roxan Hockey, CHIEF COMPLAINT:  Shock  BRIEF HISTORY:    [name] is a 80 y.o. female with a pertinent PMH of exocrine pancreatic insuffiencey, DM, HTN, HLD/CAD, endometrial adenocarcinoma s/p hysterectomy (7322) complicated by provoked DVT, who presented resection of left upper lobe hypermetabolic lung nodule 2/2 to presumed recurrence of endometrial adenocarcinoma.   HISTORY OF PRESENT ILLNESS   [name] is a 80 y.o. female with a pertinent PMH of exocrine pancreatic insuffiencey, DM, HTN, HLD/CAD, endometrial adenocarcinoma s/p hysterectomy (0254) complicated by provoked DVT, who presented VAT resection of left upper lobe hypermetabolic lung nodule 2/2 to presumed recurrence of endometrial adenocarcinoma.   Her hospital course was complicated by intraoperative hemorrhage of roughly 3 liters with hemodynamic instability and resulting PEA arrest with ROSC in roughly 3-5 minutes. The surgery was converted to an open thoracotomy to achieve hemostasis and a chest tube was placed in the left hemithorax. The chest tube has drained roughly 800 cc of sanguinous fluid since returning to the ICU for an estimated total blood loss of roughly 3.8 L. Intraoperative ABG was performed with a critically low ph of 6.89 that has since improved to 7.2 with pressors and 4 units PRBC.   SIGNIFICANT PAST MEDICAL HISTORY   exocrine pancreatic insuffiencey, DM, HTN, HLD/CAD, endometrial adenocarcinoma s/p hysterectomy (2706) complicated by provoked DVT  SIGNIFICANT EVENTS:  10/13 - Admitted for VATS of left upper lobe lung nodule.  10/13 - Rt femoral CVC placed  10/14 extubated, received 4 platelets, 2 RBC 10/15> remove Aline, fem CVC, off pressors   CULTURES:  N/a  ANTIBIOTICS:  cefazolin  LINES/TUBES:  10/13 Arterial line - Rt radial >  removed 10/15 10/13 CVC - Rt IJ 10/13 - Lt Chest tube 10/13 - ETT - 7 mm > removed 10/14 10/13 CVC - Rt femoral > removed 10/15 10/13 foley  Interval history:  She feels better today. Has been out of bed a few times.  OBJECTIVE:   BP (!) 144/61 (BP Location: Right Arm)   Pulse 78   Temp 97.7 F (36.5 C) (Oral)   Resp 10   Ht 5\' 5"  (1.651 m)   Wt 72.1 kg   SpO2 99%   BMI 26.45 kg/m   I/O last 3 completed shifts: In: 1034 [P.O.:474; I.V.:58; Blood:502] Out: 3760 [Urine:3120; Chest Tube:640]        PHYSICAL EXAM: General: ill appearing woman sitting up in bed in NAD HEENT: [place]/AT, eyes anicteric Cardiovascular: S1S2, regular rate and rhythm Lungs: Bloody fluid from chest tube continues to slow down.  Increased rales on the left compared to the right.  Breathing comfortably on nasal cannula. abdomen: Soft, nontender, nondistended Musculoskeletal: Arm edema persists, mild pretibial edema. GU: f Foley draining clear yellow urine. Skin: No rashes, warm and dry Neuro: Awake and alert, moving all extremities  Platelets 58 H/H 8.3/25.8 Cr 0.77   RESOLVED PROBLEM LIST   AGMA Hypokalemia Elevated transaminases  ASSESSMENT AND PLAN    Acute hypoxic and hypercarbic respiratory failure postoperatively Postop hemopneumothorax Acute pulmonary edema due to IVF & blood transfusions -Con't chest tube per TCTS management. Pneumothorax is stable and fairly small and there is no current indication to place another tube.  - Pulmonary hygiene out of bed mobility - Needs additional diuresis - Wean supplemental oxygen as able - DuoNebs as needed  Acute blood  loss anemia Resolved Hemorraghic shock post segmentectomy of a left upper lobe nodule with resulting intraoperative hemorrhage and hemorrhagic shock.  MTP completed on 10/13, improved significantly today. About 2L bloody output total out from chest tube since yesterday.   Consumptive thrombocytopenia Mild consumptive  coagulopathy- improved -Continue to monitor - History hemoglobin less than 7 hemodynamically significant bleeding.  Would still aim to keep platelets greater than 50.  HTN -Continue PTA metoprolol  HLD -Continue PTA statin  Presumed metastatic endometrial adenocarcinoma s/p resection -surgical pathology pending  Secondary insulin dependant DM, controlled hyperglycemia -Continue basal bolus insulin - Goal BG 140-180 -SSI as needed - Resume PTA Jardiance  Transferring to floor today.  CCM will sign off.  Please reconsult as needed.    Best Practice / Goals of Care / Disposition.   DVT PROPHYLAXIS: SCD GI: PPI  NUTRITION: diabetic  MOBILITY: ambulate with assist FAMILY DISCUSSIONS: daughter updated 10/14 at bedside DISPOSITION ICU  LABS  Glucose Recent Labs  Lab 03/11/21 1628 03/12/21 0810 03/12/21 1142 03/12/21 1612 03/12/21 2150 03/13/21 0623  GLUCAP 108* 123* 137* 134* 131* 129*     BMET Recent Labs  Lab 03/11/21 0450 03/12/21 0245 03/13/21 0159  NA 137 137 134*  K 4.4 4.6 4.2  CL 103 102 97*  CO2 27 29 31   BUN 16 19 17   CREATININE 0.84 0.77 0.75  GLUCOSE 125* 129* 138*     Liver Enzymes Recent Labs  Lab 03/11/21 0450 03/12/21 0245 03/13/21 0159  AST 60* 34 24  ALT 30 23 20   ALKPHOS 49 59 72  BILITOT 0.8 0.9 1.2  ALBUMIN 2.3* 2.3* 2.4*     Electrolytes Recent Labs  Lab 03/10/21 1403 03/10/21 2326 03/11/21 0450 03/12/21 0245 03/13/21 0159  CALCIUM 8.1* 7.6* 7.5* 7.7* 7.9*  MG 1.3* 2.5* 2.4  --   --   PHOS 2.8 3.2  --   --   --      CBC Recent Labs  Lab 03/12/21 1546 03/12/21 1821 03/13/21 0159  WBC 6.8 7.9 8.1  HGB 8.7* 8.6* 8.3*  HCT 25.9* 25.8* 25.8*  PLT 55* 52* 58*       Julian Hy, DO 03/13/21 11:18 AM [facility]

## 2021-03-13 NOTE — Evaluation (Signed)
Physical Therapy Evaluation Patient Details Name: [name] MRN: [id number] DOB: Feb 12, 1941 Today's Date: 03/13/2021  History of Present Illness  Pt is a 80yo female who presents on 10/13 for a  XI robotic assisted throascopy and L upper lobe wedge resenction for L upper lobe nodule. PSH: s/p total hysterectomy, whipple procedure, PMH: iron deficiency, osteopenia, endometrial ca,GI bleed, DVT   Clinical Impression  Pt very deconditioned presenting with poor activity tolerance s/p surgery. Pt was indep PTA now requires assist for all mobility and ADLs. Pt began ambulation today with 8' prior to needing to sit. Per dtr, Dawn, who was present, 24/7 assist will not be able to be provided to patient. At this time recommending SNF upon d/c to allow pt increased time to achieve safe mod I level of function for safe return home.  Acute PT to follow.     Recommendations for follow up therapy are one component of a multi-disciplinary discharge planning process, led by the attending physician.  Recommendations may be updated based on patient status, additional functional criteria and insurance authorization.  Follow Up Recommendations SNF;Supervision/Assistance - 24 hour    Equipment Recommendations  Rolling walker with 5" wheels    Recommendations for Other Services       Precautions / Restrictions Precautions Precautions: Fall Precaution Comments: L chest tube Restrictions Weight Bearing Restrictions: No      Mobility  Bed Mobility Overal bed mobility: Needs Assistance Bed Mobility: Supine to Sit     Supine to sit: Mod assist;HOB elevated     General bed mobility comments: modA to bring hips to edge, HOB elevated, pt able to brings LEs to edge, pt reaching with UEs to use bed rail    Transfers Overall transfer level: Needs assistance Equipment used: Rolling walker (2 wheeled) Transfers: Sit to/from Stand Sit to Stand: Min assist;+2 safety/equipment;From elevated  surface         General transfer comment: verbal cues for hand placement, increased time  Ambulation/Gait Ambulation/Gait assistance: Mod assist;+2 safety/equipment Gait Distance (Feet): 8 Feet Assistive device: Rolling walker (2 wheeled) Gait Pattern/deviations: Step-to pattern;Step-through pattern;Decreased stride length;Shuffle Gait velocity: dec Gait velocity interpretation: <1.8 ft/sec, indicate of risk for recurrent falls General Gait Details: pt very slow and guarded, max encouragement and verbal cues to "trust her legs". Pt provided forward movement of walker to promote increased step length, pt with narrow base of support and quickly fatigued stating "i feel so weak, like I"m going to pass out." pt assisted to sit in chair, VSS but very fatigued  Stairs            Wheelchair Mobility    Modified Rankin (Stroke Patients Only)       Balance Overall balance assessment: Needs assistance Sitting-balance support: Feet supported;Bilateral upper extremity supported Sitting balance-Leahy Scale: Fair     Standing balance support: Bilateral upper extremity supported Standing balance-Leahy Scale: Poor Standing balance comment: dependent on RW                             Pertinent Vitals/Pain Pain Assessment: 0-10 Pain Score: 3  Pain Location: chest tube site Pain Descriptors / Indicators: Sore Pain Intervention(s): Monitored during session    Home Living Family/patient expects to be discharged to:: Private residence Living Arrangements: Alone Available Help at Discharge: Family;Available PRN/intermittently Type of Home: House Home Access: Stairs to enter Entrance Stairs-Rails: Right;Left Entrance Stairs-Number of Steps: 5 Home Layout: One level Home Equipment:  None      Prior Function Level of Independence: Independent               Hand Dominance   Dominant Hand: Right    Extremity/Trunk Assessment   Upper Extremity  Assessment Upper Extremity Assessment: Generalized weakness    Lower Extremity Assessment Lower Extremity Assessment: Generalized weakness    Cervical / Trunk Assessment Cervical / Trunk Assessment: Other exceptions Cervical / Trunk Exceptions: chest tube  Communication   Communication: No difficulties (soft spoken)  Cognition Arousal/Alertness: Awake/alert Behavior During Therapy: Flat affect Overall Cognitive Status: Within Functional Limits for tasks assessed                                 General Comments: flat but fatigued, able to follow all commands and gave good effort      General Comments General comments (skin integrity, edema, etc.): pt bleeding from nose s/p ambulation, RN aware, VSS, on 1LO2 via [place]    Exercises     Assessment/Plan    PT Assessment Patient needs continued PT services  PT Problem List Decreased strength;Decreased range of motion;Decreased activity tolerance;Decreased balance;Decreased mobility;Decreased coordination;Decreased cognition;Decreased knowledge of use of DME;Decreased safety awareness       PT Treatment Interventions DME instruction;Gait training;Stair training;Functional mobility training;Therapeutic exercise;Balance training;Therapeutic activities;Neuromuscular re-education    PT Goals (Current goals can be found in the Care Plan section)  Acute Rehab PT Goals Patient Stated Goal: to get better PT Goal Formulation: With patient Time For Goal Achievement: 03/27/21 Potential to Achieve Goals: Good    Frequency Min 3X/week   Barriers to discharge Decreased caregiver support per daughter 24/7 would not be able to be provided    Co-evaluation               AM-PAC PT "6 Clicks" Mobility  Outcome Measure Help needed turning from your back to your side while in a flat bed without using bedrails?: A Little Help needed moving from lying on your back to sitting on the side of a flat bed without using bedrails?:  A Little Help needed moving to and from a bed to a chair (including a wheelchair)?: A Little Help needed standing up from a chair using your arms (e.g., wheelchair or bedside chair)?: A Lot Help needed to walk in hospital room?: A Lot Help needed climbing 3-5 steps with a railing? : Total 6 Click Score: 14    End of Session Equipment Utilized During Treatment: Gait belt;Oxygen Activity Tolerance: Patient limited by fatigue Patient left: in chair;with call bell/phone within reach;with nursing/sitter in room;with family/visitor present Nurse Communication: Mobility status PT Visit Diagnosis: Unsteadiness on feet (R26.81);Muscle weakness (generalized) (M62.81);Difficulty in walking, not elsewhere classified (R26.2)    Time: 9532-0233 PT Time Calculation (min) (ACUTE ONLY): 27 min   Charges:   PT Evaluation $PT Eval Moderate Complexity: 1 Mod PT Treatments $Gait Training: 8-22 mins        Kittie Plater, PT, DPT Acute Rehabilitation Services Pager #: [phone] Office #: [phone]   Berline Lopes 03/13/2021, 2:55 PM

## 2021-03-13 NOTE — Progress Notes (Signed)
      [address]       [place] [address]             [phone]      POD # 4 LUL segmentectomy  Up in chair most of day  Did work with PT today although very limited  BP (!) 178/65   Pulse 65   Temp 98.2 F (36.8 C) (Oral)   Resp 16   Ht 5\' 5"  (1.651 m)   Wt 72.1 kg   SpO2 96%   BMI 26.45 kg/m   Intake/Output Summary (Last 24 hours) at 03/13/2021 1715 Last data filed at 03/13/2021 1700 Gross per 24 hour  Intake 1050 ml  Output 2290 ml  Net -1240 ml   Awaiting bed on progressive unit  Path - metastatic endometrial cancer. Will follow up with Dr. Denman George as an outpatient  Revonda Standard. Roxan Hockey, MD [facility] [phone]

## 2021-03-13 NOTE — Evaluation (Signed)
Clinical/Bedside Swallow Evaluation Patient Details  Name: [name] MRN: [id number] Date of Birth: 08/07/1940  Today's Date: 03/13/2021 Time: SLP Start Time (ACUTE ONLY): 0940 SLP Stop Time (ACUTE ONLY): 4196 SLP Time Calculation (min) (ACUTE ONLY): 10 min  Past Medical History:  Past Medical History:  Diagnosis Date   Anemia    Atypical mole 05/15/2005   Left Mid Outer Back (slight to moderate)   BCC (basal cell carcinoma of skin) 11/17/1991   Right Cheek (excision)   BCC (basal cell carcinoma of skin) 05/30/1994   Left Upper Forehead (curet and 5FU)   BCC (basal cell carcinoma of skin) 05/04/1997   Right upper Forehead/hairline (curet and excision)   BCC (basal cell carcinoma of skin) 06/25/1997   Right Upper Forehead/hairline (+margin) (MOH's)   BCC (basal cell carcinoma of skin) 11/12/2006   Left Cheek (curet and excision)   BCC (basal cell carcinoma of skin) 08/15/2009   Right Lower Leg (curet 5FU)   BCE (basal cell epithelioma), leg, right    Cataract 2021   Colon polyps    Coronary artery disease 2008   Coronary atherosclerosis    Diabetes mellitus without complication ([place])    pre-diabetic   DVT of lower extremity (deep venous thrombosis) ([place]) 07/2010   endometrioid endometrial    GI bleed 2009   GIST (gastrointestinal stromal tumor), 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 Sondra Foley   History of radiation therapy 08/17/2020, 08/23/2020, 08/25/2020   vaginal brachytherapy     Dr [name]   Hypercholesteremia    Hyperlipidemia    Hypertension    per patient-on medication for preventative/has not diagnosed with HTN    Insomnia    Iron deficiency    Joint pain    Lung cancer ([place])    Osteopenia    Reflux    SCCA (squamous cell carcinoma) of skin 05/15/2005   Left Chest (in situ)   SCCA (squamous cell carcinoma) of skin 10/24/2011   Left Side of Chest Med (in situ)  (tx p bx)   SCCA (squamous cell carcinoma) of skin 01/28/2019   Right Forearm inf (Keratoacanthoma) (tx p bx)   Superficial basal cell carcinoma (BCC) 06/25/1995   Upper Forehead at scar (curet and 5FU)   Ulcer    epigastric    Past Surgical History:  Past Surgical History:  Procedure Laterality Date   ABDOMINAL HYSTERECTOMY     AUGMENTATION MAMMAPLASTY     BREAST ENHANCEMENT SURGERY     since removed in 2016   [place]     pt claims she has never had a cardiac catheterization   CATARACT EXTRACTION Bilateral 2021   CHOLECYSTECTOMY     INTERCOSTAL NERVE BLOCK Left 03/09/2021   Procedure: INTERCOSTAL NERVE BLOCK;  Surgeon: Carol Nakayama, MD;  Location: [facility];  Service: Thoracic;  Laterality: Left;   LYMPH NODE DISSECTION Left 03/09/2021   Procedure: LYMPH NODE DISSECTION;  Surgeon: Carol Nakayama, MD;  Location: [facility];  Service: Thoracic;  Laterality: Left;   [place]  06/27/2010   whipple    ROBOTIC ASSISTED TOTAL HYSTERECTOMY WITH BILATERAL SALPINGO OOPHERECTOMY Bilateral 10/02/2016   Procedure: XI ROBOTIC ASSISTED TOTAL HYSTERECTOMY WITH BILATERAL SALPINGO OOPHORECTOMY WITH SENTINAL LYMPH NODE BIOPSY LYSIS OF ADHESIONS, EXPLORATORY LAPAROTOMY;  Surgeon: [name],  Carol Dupont, MD;  Location: [facility];  Service: Gynecology;  Laterality: Bilateral;   THORACOTOMY Left 03/09/2021   Procedure: THORACOTOMY MAJOR;  Surgeon: Carol Nakayama, MD;  Location: [facility];  Service: Thoracic;  Laterality: Left;   [place] PROCEDURE  2012   HPI:  [name]  presented with VAT resection of left upper lobe hypermetabolic lung nodule 2/2 to presumed recurrence of endometrial adenocarcinoma on 10/13. Complicated by intraoperative hemorrhage of roughly 3 liters with hemodynamic instability and resulting PEA arrest with ROSC in roughly 3-5 minutes. The surgery was converted to an open  thoracotomy to achieve hemostasis and a chest tube was placed in the left hemithorax. ETT 10/13-10/14. Pt suffered from blood loss anemia and weakness post op and complains of difficulty swallowing.  PMH of exocrine pancreatic insuffiency, DM, HTN, HLD/CAD, endometrial adenocarcinoma s/p hysterectomy (2018).    Assessment / Plan / Recommendation  Clinical Impression  Pt reports feeling difficulty in swallowing solids over the weekend, but feels much better today. Pt says it flet like solids wouldnt go down and she reproted having a similar feeling prior to admit and also reports a history of reflux, on carafate. Pt demonstrated no signs of dysphagia under observation. SLP encouraged basic strategies like following solid with liquids, etc. No diet modification or SLP f/u needed, will sign off. SLP Visit Diagnosis: Dysphagia, unspecified (R13.10)    Aspiration Risk  No limitations    Diet Recommendation Regular;Thin liquid   Liquid Administration via: Cup;Straw Medication Administration: Whole meds with liquid Supervision: Patient able to self feed Compensations: Follow solids with liquid    Other  Recommendations Oral Care Recommendations: Oral care BID    Recommendations for follow up therapy are one component of a multi-disciplinary discharge planning process, led by the attending physician.  Recommendations may be updated based on patient status, additional functional criteria and insurance authorization.  Follow up Recommendations None      Frequency and Duration            Prognosis        Swallow Study   General HPI: [name]  presented with VAT resection of left upper lobe hypermetabolic lung nodule 2/2 to presumed recurrence of endometrial adenocarcinoma on 10/13. Complicated by intraoperative hemorrhage of roughly 3 liters with hemodynamic instability and resulting PEA arrest with ROSC in roughly 3-5 minutes. The surgery was converted to an open thoracotomy to  achieve hemostasis and a chest tube was placed in the left hemithorax. ETT 10/13-10/14. Pt suffered from blood loss anemia and weakness post op and complains of difficulty swallowing.  PMH of exocrine pancreatic insuffiency, DM, HTN, HLD/CAD, endometrial adenocarcinoma s/p hysterectomy (2018). Type of Study: Bedside Swallow Evaluation Previous Swallow Assessment: none Diet Prior to this Study: Regular;Thin liquids Temperature Spikes Noted: No Respiratory Status: Nasal cannula History of Recent Intubation: Yes Length of Intubations (days): 2 days Date extubated: 03/10/21 Behavior/Cognition: Alert;Cooperative;Pleasant mood Oral Cavity Assessment: Within Functional Limits Oral Care Completed by SLP: No Oral Cavity - Dentition: Adequate natural dentition Vision: Functional for self-feeding Self-Feeding Abilities: Able to feed self Patient Positioning: Upright in bed Baseline Vocal Quality: Normal Volitional Cough: Strong;Congested Volitional Swallow: Able to elicit    Oral/Motor/Sensory Function Overall Oral Motor/Sensory Function: Within functional limits   Ice Chips     Thin Liquid Thin Liquid: Within functional limits Presentation: Straw;Self Fed    Nectar Thick Nectar Thick Liquid: Not tested   Honey Thick Honey Thick Liquid: Not tested  Puree Puree: Within functional limits Presentation: Spoon   Solid     Solid: Within functional limits      Leanette Eutsler, Katherene Ponto 03/13/2021,11:10 AM

## 2021-03-13 NOTE — Progress Notes (Signed)
4 Days Post-Op Procedure(s) (LRB): XI ROBOTIC ASSISTED THORASCOPY-LEFT UPPER LOBE SEGMENTECTOMY (Left) INTERCOSTAL NERVE BLOCK (Left) LYMPH NODE DISSECTION (Left) THORACOTOMY MAJOR (Left) Subjective: Some incisional pain, some difficulty swallowing over the weekend  Objective: Vital signs in last 24 hours: Temp:  [97.6 F (36.4 C)-98.5 F (36.9 C)] 97.7 F (36.5 C) (10/17 0625) Pulse Rate:  [61-95] 68 (10/17 0600) Cardiac Rhythm: Normal sinus rhythm (10/17 0400) Resp:  [10-31] 12 (10/17 0600) BP: (115-170)/(53-101) 157/54 (10/17 0600) SpO2:  [95 %-100 %] 98 % (10/17 0600)  Hemodynamic parameters for last 24 hours:    Intake/Output from previous day: 10/16 0701 - 10/17 0700 In: 1034 [P.O.:474; I.V.:58; Blood:502] Out: 3140 [Urine:2770; Chest Tube:370] Intake/Output this shift: No intake/output data recorded.  General appearance: alert, cooperative, and no distress Neurologic: intact Heart: regular rate and rhythm Lungs: rhonchi bilaterally No air leak, serosanguinous drainage from CT  Lab Results: Recent Labs    03/12/21 1821 03/13/21 0159  WBC 7.9 8.1  HGB 8.6* 8.3*  HCT 25.8* 25.8*  PLT 52* 58*   BMET:  Recent Labs    03/12/21 0245 03/13/21 0159  NA 137 134*  K 4.6 4.2  CL 102 97*  CO2 29 31  GLUCOSE 129* 138*  BUN 19 17  CREATININE 0.77 0.75  CALCIUM 7.7* 7.9*    PT/INR:  Recent Labs    03/11/21 0450  LABPROT 18.9*  INR 1.6*   ABG    Component Value Date/Time   PHART 7.418 03/10/2021 0541   HCO3 20.4 03/10/2021 0541   TCO2 21 (L) 03/10/2021 0541   ACIDBASEDEF 3.0 (H) 03/10/2021 0541   O2SAT 100.0 03/10/2021 0541   CBG (last 3)  Recent Labs    03/12/21 1612 03/12/21 2150 03/13/21 0623  GLUCAP 134* 131* 129*    Assessment/Plan: S/P Procedure(s) (LRB): XI ROBOTIC ASSISTED THORASCOPY-LEFT UPPER LOBE SEGMENTECTOMY (Left) INTERCOSTAL NERVE BLOCK (Left) LYMPH NODE DISSECTION (Left) THORACOTOMY MAJOR (Left) Slowly  progressing NEURO- intact CV- hypertensive- increase Lopressor to home dose RESP- sats Ok on 2L [place]  Continue current Rx  No air leak- CT to water seal RENAL- creatinine and lytes OK  PO Lasix  DC Foley ENDO- CBG mildly elevated  Restart Jardiance, continue SSI GI- tolerating PO, ate ~50% of breakfast HEME- Anemia secondary to ABL- stable  Thrombocytopenia- PLT remain low but no active bleeding, monitor Deconditioning- severe, PT consult, mobilize as tolerated Swallowing eval by Speech Transfer to [place] when bed available   LOS: 4 days    [name] 03/13/2021

## 2021-03-14 ENCOUNTER — Inpatient Hospital Stay (HOSPITAL_COMMUNITY): Payer: PPO

## 2021-03-14 LAB — CBC WITH DIFFERENTIAL/PLATELET
Abs Immature Granulocytes: 0.47 10*3/uL — ABNORMAL HIGH (ref 0.00–0.07)
Basophils Absolute: 0 10*3/uL (ref 0.0–0.1)
Basophils Relative: 0 %
Eosinophils Absolute: 0.2 10*3/uL (ref 0.0–0.5)
Eosinophils Relative: 2 %
HCT: 26.5 % — ABNORMAL LOW (ref 36.0–46.0)
Hemoglobin: 8.7 g/dL — ABNORMAL LOW (ref 12.0–15.0)
Immature Granulocytes: 6 %
Lymphocytes Relative: 7 %
Lymphs Abs: 0.6 10*3/uL — ABNORMAL LOW (ref 0.7–4.0)
MCH: 29.7 pg (ref 26.0–34.0)
MCHC: 32.8 g/dL (ref 30.0–36.0)
MCV: 90.4 fL (ref 80.0–100.0)
Monocytes Absolute: 1.1 10*3/uL — ABNORMAL HIGH (ref 0.1–1.0)
Monocytes Relative: 12 %
Neutro Abs: 6.3 10*3/uL (ref 1.7–7.7)
Neutrophils Relative %: 73 %
Platelets: 45 10*3/uL — ABNORMAL LOW (ref 150–400)
RBC: 2.93 MIL/uL — ABNORMAL LOW (ref 3.87–5.11)
RDW: 16.1 % — ABNORMAL HIGH (ref 11.5–15.5)
Smear Review: DECREASED
WBC: 8.6 10*3/uL (ref 4.0–10.5)
nRBC: 0.2 % (ref 0.0–0.2)

## 2021-03-14 LAB — BASIC METABOLIC PANEL
Anion gap: 7 (ref 5–15)
BUN: 13 mg/dL (ref 8–23)
CO2: 31 mmol/L (ref 22–32)
Calcium: 8.1 mg/dL — ABNORMAL LOW (ref 8.9–10.3)
Chloride: 96 mmol/L — ABNORMAL LOW (ref 98–111)
Creatinine, Ser: 0.7 mg/dL (ref 0.44–1.00)
GFR, Estimated: >60 mL/min (ref >60)
Glucose, Bld: 162 mg/dL — ABNORMAL HIGH (ref 70–99)
Potassium: 4.1 mmol/L (ref 3.5–5.1)
Sodium: 134 mmol/L — ABNORMAL LOW (ref 135–145)

## 2021-03-14 LAB — GLUCOSE, CAPILLARY
Glucose-Capillary: 156 mg/dL — ABNORMAL HIGH (ref 70–99)
Glucose-Capillary: 229 mg/dL — ABNORMAL HIGH (ref 70–99)
Glucose-Capillary: 168 mg/dL — ABNORMAL HIGH (ref 70–99)
Glucose-Capillary: 149 mg/dL — ABNORMAL HIGH (ref 70–99)

## 2021-03-14 MED ORDER — MAGIC MOUTHWASH W/LIDOCAINE
5.0000 mL | Freq: Three times a day (TID) | ORAL | Status: DC | PRN
  Filled 2021-03-14: qty 5

## 2021-03-14 NOTE — Progress Notes (Addendum)
      [address]       [place] [address]             [phone]       5 Days Post-Op Procedure(s) (LRB): XI ROBOTIC ASSISTED THORASCOPY-LEFT UPPER LOBE SEGMENTECTOMY (Left) INTERCOSTAL NERVE BLOCK (Left) LYMPH NODE DISSECTION (Left) THORACOTOMY MAJOR (Left)  Subjective: Patient asking for assistance with sitting up as she would like to eat her breakfast. Nurse and I assisted with positioning.  Objective: Vital signs in last 24 hours: Temp:  [97.7 F (36.5 C)-98.9 F (37.2 C)] 98.4 F (36.9 C) (10/18 0338) Pulse Rate:  [56-95] 56 (10/18 0338) Cardiac Rhythm: Normal sinus rhythm (10/17 2330) Resp:  [10-23] 10 (10/18 0338) BP: (130-191)/(51-69) 130/51 (10/18 0338) SpO2:  [92 %-100 %] 98 % (10/18 0338)     Intake/Output from previous day: 10/17 0701 - 10/18 0700 In: 731.8 [P.O.:720; I.V.:11.8] Out: 6440 [Urine:3050; Chest Tube:430]   Physical Exam:  Cardiovascular: RRR Pulmonary: Coarse breath sounds Abdomen: Soft, non tender, bowel sounds present. Extremities: SCDs in place LEs. Left arm with ecchymosis Wounds: Clean and dry.  No erythema or signs of infection. Ecchymosis on left Chest Tube: to water seal, no air leak;bloody like drainage  Lab Results: CBC: Recent Labs    03/13/21 0159 03/14/21 0524  WBC 8.1 8.6  HGB 8.3* 8.7*  HCT 25.8* 26.5*  PLT 58* 45*   BMET:  Recent Labs    03/13/21 0159 03/14/21 0524  NA 134* 134*  K 4.2 4.1  CL 97* 96*  CO2 31 31  GLUCOSE 138* 162*  BUN 17 13  CREATININE 0.75 0.70  CALCIUM 7.9* 8.1*    PT/INR: No results for input(s): LABPROT, INR in the last 72 hours. ABG:  INR: Will add last result for INR, ABG once components are confirmed Will add last 4 CBG results once components are confirmed  Assessment/Plan:  1. CV - SR with HR in the 70's. On Lopressor 50 mg bid 2.  Pulmonary - On 3 liters of oxygen via [place]. Wean as able. Chest tube with 430 cc last 24 hours. . Chest tube is to water  seal, no air leak. CXR this am appears stable (small left apical pneumothorax). Chest tube to remain for now. Encourage incentive spirometer. Final pathology metastatic endometrial cancer. She will follow up with Dr. Denman George from oncology 3. DM-CBGs 172/173/156. Pre op HGA1C 6.9. On Jardiance. 4. Expected post op blood loss anemia-H and H this am stable at 8.7 and 26.5 5. Thrombocytopenia-platelets this am decreased to 45,000. No  signs of active bleeding. 6. GI-SLP did swallow study yesterday. Patient demonstrated no signs of dysphagia under observation. Per patient, dysphagia improved from weekend.  7. History of pancreatic insufficiency-on Creon 8. Deconditioned-continue PT  Donielle M ZimmermanPA-C 03/14/2021,7:23 AM [phone]   Patient seen and examined, agree with above Deconditioning is primary issue at this point  [place]. Roxan Hockey, MD [facility] [phone]

## 2021-03-14 NOTE — Progress Notes (Signed)
Physical Therapy Treatment Patient Details Name: [name] MRN: [id number] DOB: 01-06-1941 Today's Date: 03/14/2021   History of Present Illness Pt is a 80yo female who presents on 10/13 for a  XI robotic assisted throascopy and L upper lobe wedge resenction for L upper lobe nodule. PSH: s/p total hysterectomy, whipple procedure, PMH: iron deficiency, osteopenia, endometrial ca,GI bleed, DVT    PT Comments    Pt is making slow, steady progress. Continue to recommend ST-SNF to regain independence prior to return home.    Recommendations for follow up therapy are one component of a multi-disciplinary discharge planning process, led by the attending physician.  Recommendations may be updated based on patient status, additional functional criteria and insurance authorization.  Follow Up Recommendations  SNF;Supervision/Assistance - 24 hour     Equipment Recommendations  Rolling walker with 5" wheels    Recommendations for Other Services       Precautions / Restrictions Precautions Precautions: Fall;Other (comment) Precaution Comments: L chest tube     Mobility  Bed Mobility Overal bed mobility: Needs Assistance Bed Mobility: Rolling;Sidelying to Sit Rolling: Mod assist   Supine to sit: Mod assist;HOB elevated     General bed mobility comments: Assist to bring legs off, elevate trunk into sitting, and bring hips to EOB.    Transfers Overall transfer level: Needs assistance Equipment used: 4-wheeled walker Transfers: Sit to/from [facility] Sit to Stand: Min assist;+2 safety/equipment Stand pivot transfers: Min assist;+2 safety/equipment       General transfer comment: Assist to bring hips up and for balance. Verbal cues for hand placement  Ambulation/Gait Ambulation/Gait assistance: Min assist;+2 safety/equipment Gait Distance (Feet): 15 Feet Assistive device: 4-wheeled walker Gait Pattern/deviations: Step-to pattern;Step-through  pattern;Decreased stride length;Shuffle;Trunk flexed Gait velocity: decr Gait velocity interpretation: <1.31 ft/sec, indicative of household ambulator General Gait Details: Pt with very short, shuffling steps. Verbal cues to incr step length. Assist for balance and support.   Stairs             Wheelchair Mobility    Modified Rankin (Stroke Patients Only)       Balance Overall balance assessment: Needs assistance Sitting-balance support: Bilateral upper extremity supported;No upper extremity supported Sitting balance-Leahy Scale: Fair     Standing balance support: Bilateral upper extremity supported;During functional activity Standing balance-Leahy Scale: Poor Standing balance comment: walker and min guard for static standing                            Cognition Arousal/Alertness: Awake/alert Behavior During Therapy: Flat affect Overall Cognitive Status: Within Functional Limits for tasks assessed                                        Exercises      General Comments General comments (skin integrity, edema, etc.): Pt on 3l O2 with SpO2 97%. Turned O2 off with SpO2 decr to 88%. Replaced O2 at 2L and SpO2 remained >92% throughout rest of session.      Pertinent Vitals/Pain Pain Assessment: Faces Faces Pain Scale: Hurts little more Pain Location: chest tube site with transitional movements Pain Descriptors / Indicators: Grimacing;Guarding Pain Intervention(s): Limited activity within patient's tolerance;Monitored during session;Repositioned    Home Living                      Prior Function  PT Goals (current goals can now be found in the care plan section) Acute Rehab PT Goals Patient Stated Goal: to get better Progress towards PT goals: Progressing toward goals    Frequency    Min 3X/week      PT Plan Current plan remains appropriate    Co-evaluation              AM-PAC PT "6 Clicks"  Mobility   Outcome Measure  Help needed turning from your back to your side while in a flat bed without using bedrails?: A Lot Help needed moving from lying on your back to sitting on the side of a flat bed without using bedrails?: A Lot Help needed moving to and from a bed to a chair (including a wheelchair)?: A Little Help needed standing up from a chair using your arms (e.g., wheelchair or bedside chair)?: A Little Help needed to walk in hospital room?: A Little Help needed climbing 3-5 steps with a railing? : Total 6 Click Score: 14    End of Session Equipment Utilized During Treatment: Gait belt;Oxygen Activity Tolerance: Patient limited by fatigue Patient left: in chair;with call bell/phone within reach;with family/visitor present Nurse Communication: Mobility status PT Visit Diagnosis: Unsteadiness on feet (R26.81);Muscle weakness (generalized) (M62.81);Difficulty in walking, not elsewhere classified (R26.2)     Time: 6160-7371 PT Time Calculation (min) (ACUTE ONLY): 32 min  Charges:  $Gait Training: 23-37 mins                     [place] Pager [phone] Office [place] 03/14/2021, 3:03 PM

## 2021-03-14 NOTE — Op Note (Signed)
[name] MEDICAL RECORD NO: [id number] ACCOUNT NO: [id number] DATE OF BIRTH: 07/17/1940 FACILITY: [facility] LOCATION: [facility] PHYSICIAN: Revonda Standard. Roxan Hockey, MD  Operative Report   DATE OF PROCEDURE: 03/09/2021  PREOPERATIVE DIAGNOSIS:  Left upper lobe lung nodule.  POSTOPERATIVE DIAGNOSIS:  Adenocarcinoma, left upper lobe, question metastatic versus primary.  PROCEDURE:   Xi robotic-assisted left upper lobe segmentectomy (anterior and apical posterior), Lymph node dissection, Intercostal nerve blocks levels 3 through 10,  Left thoracotomy for control of bleeding.  SURGEON:  Modesto Charon, MD  ASSISTANT: Jadene Pierini, PA  ANESTHESIA:  General.  FINDINGS:  Nodule in the left upper lobe. It was felt it would be difficult to achieve a good margin with a wedge resection.  Therefore, decision was made to perform trisegmentectomy sparing the lingula.  Frozen section revealed adenocarcinoma, possibly  metastatic. Bleeding requiring thoracotomy due to tear of lingular arterial branch.  CLINICAL NOTE:  The patient is a 80 year old woman with a history of endometrial cancer, who was found to have a lung nodule.  The nodule had grown on serial scans and was hypermetabolic.  On PET/CT, there was no evidence of other sites, so she was  advised to undergo surgical resection.  Plan was to perform a limited resection if possible.  The indications, risks, benefits, and alternatives were discussed in detail with the patient.  She understood and accepted the risks and agreed to proceed.  OPERATIVE NOTE:  The patient was brought to the preoperative holding area on 03/09/2021.  Anesthesia established intravenous access and placed an arterial blood pressure monitoring line.  She was taken to the operating room and anesthetized and intubated  with a double lumen endotracheal tube.  Intravenous antibiotics were administered.  A Foley catheter was placed.  Sequential compression devices were  placed on the calves for DVT prophylaxis.  She was placed in a right lateral decubitus position and the  left chest was prepped and draped in the usual sterile fashion.  Single lung ventilation of the right lung was initiated and was tolerated well throughout the procedure.  A timeout was performed.  A solution containing 20 mL of liposomal bupivacaine, 30 mL of 0.5% bupivacaine and 50 mL of saline was prepared.  This was used for local at the incision sites as well as for the intercostal nerve blocks.  Incision was made in  the eighth interspace in the midaxillary line and an 8 mm port was inserted.  The thoracoscope was advanced in the chest, after confirming intrapleural placement, carbon dioxide was insufflated per protocol.  A 12 mm port was placed anteriorly in the  eighth interspace and a 12 mm AirSeal port was placed in the tenth interspace. Intercostal nerve blocks were performed from the third to the tenth interspace, 10 mL of the bupivacaine solution was injected into a subpleural plane at each level.  Two  additional eighth interspace ports were placed.  The robot was deployed.  The camera arm was docked.  Targeting was performed.  The remaining arms were docked.  The robotic instruments were inserted with thoracoscopic visualization.  While waiting for  the lung to continue deflating, dissection was begun by dividing the inferior ligament. All lymph nodes that were encountered during the dissection were removed and sent as separate specimens for permanent pathology.  All appeared grossly benign.  The  pleural reflection then was divided to the hilum posteriorly and then working superiorly, nodes were removed from the pulmonary artery.  Continuing superiorly, the AP window nodes  were removed as well.  On inspection of the lung, it was obvious where the  nodule was located.  On evaluation, it was felt that to get an adequate deep margin, it would be necessary to perform a segmentectomy.  The  fissure was nearly complete.  The pulmonary artery was dissected out and the fissure was completed with bipolar  cautery.  The lingular arterial branches were dissected out and then the posterior apical and anterior vessels were dissected out as well.  The lingular vein branches were identified and preserved. The remaining upper lobe vein branches were encircled  and divided with the vascular stapler.  The arterial branches then were individually dissected out, surrounding nodes were removed and these vessels were divided with a vascular stapler as well.  The left upper lobe bronchus was identified.  The lingular  branch was preserved.  The stapler was placed across the anterior and apicoposterior segmental bronchi and closed.  Test inflation showed good aeration of the lingula and lower lobe.  The stapler was fired, transecting the bronchus.  The segmentectomy  then was completed with sequential firings of the robotic stapler using both green and black cartridges.  The left upper lobe was placed into an endoscopic retrieval bag and was brought down to the inferior portion of the chest.  The robotic instruments were removed.  The robot was undocked.  The anterior eighth interspace incision was lengthened to  approximately 3 cm.  The left upper lobe was removed in the endoscopic retrieval bag through this incision and sent for frozen section, which returned showing adenocarcinoma, suspicious for metastatic disease.  The margin was clear.  The chest was copiously irrigated with saline.  A test inflation showed no air leakage from the bronchial stump.  Inspection was made for hemostasis.  There was a small adhesion at the apex that had been from the apex of the left upper lobe to the chest  wall and there was bleeding from that adhesion.  A Bovie with a long tip was inserted into the chest.  An attempt was made to cauterize that bleeder, but with manipulation of the Bovie and the camera, there was sudden  major bleeding present.  A sponge  stick was used to control the bleeding.  The patient transiently lost her blood pressure, but responded immediately to resuscitation.  Pressure continued to be held while the blood bank was notified and blood products were brought to the room.  Dr. Gloris Manchester  returned to the room to assist with anesthetic management and resuscitation.  After waiting approximately 15 minutes, pressure was gradually withdrawn, but there was again massive bleeding noted.  Pressure was held, although it was difficult to keep  control of the bleeding at this point and there was profound hypotension again.  A thoracotomy incision was made.  Retractor was placed and digital pressure was used to control the bleeding.  A clamp then was placed on the main pulmonary artery and it  was apparent that there had been an avulsion of one of the lingular arterial branches from the pulmonary artery.  The site on the main pulmonary artery was closed with 4-0 Prolene pledgeted sutures and the lung site was closed with a running 4-0 Prolene  suture.  There has been massive blood loss and a massive transfusion protocol was undertaken with red cells as well as FFP and platelets.  Final inspection showed good hemostasis at the pulmonary artery site, the bleeding vessel at the apex had been  cauterized.  A 28-French Blake drain was placed through the original eighth interspace incision and secured with #1 silk suture.  Dual lung ventilation was resumed.  The thoracotomy incision as well as the port sites were all closed in standard  fashion.  Resuscitation was ongoing, although the patient had stabilized hemodynamically, she still had bleeding and blood products were continued to be administered.  She then was transported from the operating room to the surgical ICU intubated and in  critical but stable condition.  All sponge, needle and instrument counts were correct at the end of the procedure.   SHW D:  03/13/2021 6:32:34 pm T: 03/14/2021 12:23:00 am  JOB: [id number]/ [id number]

## 2021-03-15 ENCOUNTER — Inpatient Hospital Stay (HOSPITAL_COMMUNITY): Payer: PPO

## 2021-03-15 LAB — CBC WITH DIFFERENTIAL/PLATELET
Abs Immature Granulocytes: 0.81 10*3/uL — ABNORMAL HIGH (ref 0.00–0.07)
Basophils Absolute: 0 10*3/uL (ref 0.0–0.1)
Basophils Relative: 0 %
Eosinophils Absolute: 0.1 10*3/uL (ref 0.0–0.5)
Eosinophils Relative: 1 %
HCT: 28.1 % — ABNORMAL LOW (ref 36.0–46.0)
Hemoglobin: 9.2 g/dL — ABNORMAL LOW (ref 12.0–15.0)
Immature Granulocytes: 8 %
Lymphocytes Relative: 7 %
Lymphs Abs: 0.7 10*3/uL (ref 0.7–4.0)
MCH: 29.8 pg (ref 26.0–34.0)
MCHC: 32.7 g/dL (ref 30.0–36.0)
MCV: 90.9 fL (ref 80.0–100.0)
Monocytes Absolute: 1.2 10*3/uL — ABNORMAL HIGH (ref 0.1–1.0)
Monocytes Relative: 12 %
Neutro Abs: 7 10*3/uL (ref 1.7–7.7)
Neutrophils Relative %: 72 %
Platelets: 84 10*3/uL — ABNORMAL LOW (ref 150–400)
RBC: 3.09 MIL/uL — ABNORMAL LOW (ref 3.87–5.11)
RDW: 16.2 % — ABNORMAL HIGH (ref 11.5–15.5)
Smear Review: DECREASED
WBC: 9.9 10*3/uL (ref 4.0–10.5)
nRBC: 0.2 % (ref 0.0–0.2)

## 2021-03-15 LAB — GLUCOSE, CAPILLARY
Glucose-Capillary: 134 mg/dL — ABNORMAL HIGH (ref 70–99)
Glucose-Capillary: 278 mg/dL — ABNORMAL HIGH (ref 70–99)
Glucose-Capillary: 144 mg/dL — ABNORMAL HIGH (ref 70–99)
Glucose-Capillary: 142 mg/dL — ABNORMAL HIGH (ref 70–99)

## 2021-03-15 LAB — BASIC METABOLIC PANEL
Anion gap: 9 (ref 5–15)
BUN: 14 mg/dL (ref 8–23)
CO2: 29 mmol/L (ref 22–32)
Calcium: 7.9 mg/dL — ABNORMAL LOW (ref 8.9–10.3)
Chloride: 89 mmol/L — ABNORMAL LOW (ref 98–111)
Creatinine, Ser: 0.78 mg/dL (ref 0.44–1.00)
GFR, Estimated: >60 mL/min (ref >60)
Glucose, Bld: 137 mg/dL — ABNORMAL HIGH (ref 70–99)
Potassium: 3.7 mmol/L (ref 3.5–5.1)
Sodium: 127 mmol/L — ABNORMAL LOW (ref 135–145)

## 2021-03-15 MED ORDER — POTASSIUM CHLORIDE CRYS ER 20 MEQ PO TBCR
20.0000 meq | EXTENDED_RELEASE_TABLET | Freq: Every day | ORAL | Status: DC
  Administered 2021-03-16 – 2021-03-17 (×2): 20 meq via ORAL
  Filled 2021-03-15 (×3): qty 1

## 2021-03-15 MED ORDER — AMIODARONE HCL IN DEXTROSE 360-4.14 MG/200ML-% IV SOLN
60.0000 mg/h | INTRAVENOUS | Status: DC
  Administered 2021-03-15 (×2): 60 mg/h via INTRAVENOUS
  Filled 2021-03-15: qty 200

## 2021-03-15 MED ORDER — AMIODARONE HCL IN DEXTROSE 360-4.14 MG/200ML-% IV SOLN
30.0000 mg/h | INTRAVENOUS | Status: DC
  Administered 2021-03-15 – 2021-03-16 (×2): 30 mg/h via INTRAVENOUS
  Filled 2021-03-15 (×3): qty 200

## 2021-03-15 MED ORDER — AMIODARONE LOAD VIA INFUSION
150.0000 mg | Freq: Once | INTRAVENOUS | Status: AC
  Administered 2021-03-15: 150 mg via INTRAVENOUS
  Filled 2021-03-15: qty 83.34

## 2021-03-15 MED ORDER — POTASSIUM CHLORIDE CRYS ER 20 MEQ PO TBCR
30.0000 meq | EXTENDED_RELEASE_TABLET | Freq: Two times a day (BID) | ORAL | Status: AC
  Administered 2021-03-15 (×2): 30 meq via ORAL
  Filled 2021-03-15 (×2): qty 1

## 2021-03-15 NOTE — Progress Notes (Addendum)
      [address]       [place] [address]             [phone]       6 Days Post-Op Procedure(s) (LRB): XI ROBOTIC ASSISTED THORASCOPY-LEFT UPPER LOBE SEGMENTECTOMY (Left) INTERCOSTAL NERVE BLOCK (Left) LYMPH NODE DISSECTION (Left) THORACOTOMY MAJOR (Left)  Subjective: Patient asking for help with breakfast tray. Patient states her depth perception is off;she spilled sugar and creamer this am trying to put in oatmeal and coffee. She did walk with PT but states she is still fairly weak  Objective: Vital signs in last 24 hours: Temp:  [97.5 F (36.4 C)-98.8 F (37.1 C)] 97.9 F (36.6 C) (10/19 0337) Pulse Rate:  [65-94] 76 (10/19 0337) Cardiac Rhythm: Normal sinus rhythm (10/19 0400) Resp:  [14-19] 18 (10/19 0337) BP: (142-157)/(56-84) 150/63 (10/19 0337) SpO2:  [96 %-97 %] 96 % (10/19 0337) Weight:  [68.2 kg] 68.2 kg (10/19 0337)     Intake/Output from previous day: 10/18 0701 - 10/19 0700 In: -  Out: 3580 [Urine:3350; Chest Tube:230]   Physical Exam:  Cardiovascular: RRR Pulmonary: Coarse breath sounds Abdomen: Soft, non tender, bowel sounds present. Extremities: No calf tenderness. Left arm with Kerlex (ecchymosis) Wounds: Clean and dry.  No erythema or signs of infection. Ecchymosis on left Chest Tube: to water seal, no air leak  Lab Results: CBC: Recent Labs    03/14/21 0524 03/15/21 0345  WBC 8.6 9.9  HGB 8.7* 9.2*  HCT 26.5* 28.1*  PLT 45* 84*    BMET:  Recent Labs    03/14/21 0524 03/15/21 0345  NA 134* 127*  K 4.1 3.7  CL 96* 89*  CO2 31 29  GLUCOSE 162* 137*  BUN 13 14  CREATININE 0.70 0.78  CALCIUM 8.1* 7.9*     PT/INR: No results for input(s): LABPROT, INR in the last 72 hours. ABG:  INR: Will add last result for INR, ABG once components are confirmed Will add last 4 CBG results once components are confirmed  Assessment/Plan:  1. CV - SR with HR in the 70's. On Lopressor 50 mg bid 2.  Pulmonary - On 3  liters of oxygen via [place]. Wean as able. Chest tube with 230 cc last 24 hours. I marked Pleura VAC this am for accurate output. Chest tube is to water seal, no air leak. CXR this am appears stable (small left apical pneumothorax,small right pleural effusion/atelectasis).  Encourage incentive spirometer. Final pathology metastatic endometrial cancer. She will follow up with Dr. Denman George from oncology 3. DM-CBGs 168/149/134. Pre op HGA1C 6.9. On Jardiance. 4. Expected post op blood loss anemia-H and H this am stable at 9.2 and 28.1 5. Thrombocytopenia-platelets this am increased to 84,000. No  signs of active bleeding. 7. History of pancreatic insufficiency-on Creon 8. Potassium 3.7-supplement 9. Hyponatremia-sodium decreased to 127. Possibly related to daily Lasix. Monitor 10. Deconditioned-continue PT  Carol M ZimmermanPA-C 03/15/2021,6:58 AM  Patient seen and examined, agree with assessment and plan as noted above Will leave tube one more day  Remo Lipps C. Roxan Hockey, MD [facility] [phone]

## 2021-03-15 NOTE — Progress Notes (Signed)
RN noted monitor showing afib with HR in 120s. NT did EKG and RN paged provider. Provider said to give 150 mg bolus amiodarone and amio continuous gtt after, which RN did. PA came to bedside. RN continuing to monitor.

## 2021-03-15 NOTE — TOC Progression Note (Signed)
Transition of Care [facility]) - Progression Note    Patient Details  Name: [name] MRN: [id number] Date of Birth: 10-07-1940  [facility]) CM/SW Contact  Zenon Mayo, RN Phone Number: 03/15/2021, 6:52 PM  Clinical Narrative:    from home alone, pod 6 robotic assist segmentectomy, conts with chest tube, amio drip.plan for SNF, TOC will cont to follow for dc needs.   Expected Discharge Plan: [place] Barriers to Discharge: Continued Medical Work up  Expected Discharge Plan and Services Expected Discharge Plan: [place] In-house Referral: Clinical Social Work   Post Acute Care Choice: [place] Living arrangements for the past 2 months: Single Family Home                                       Social Determinants of Health (SDOH) Interventions    Readmission Risk Interventions No flowsheet data found.

## 2021-03-15 NOTE — NC FL2 (Signed)
[place] MEDICAID FL2 LEVEL OF CARE SCREENING TOOL     IDENTIFICATION  Patient Name: [name] Birthdate: May 04, 1941 Sex: female Admission Date (Current Location): 03/09/2021  [facility] and [place] Number:  Herbalist and Address:  [facility], [place] [address] [place], [place] [address]      Provider Number: [id number]  Attending Physician Name and Address:  [name], MD  Relative Name and Phone Number:  Melvyn Novas, [phone]    Current Level of Care: Hospital Recommended Level of Care: [place] Prior Approval Number:    Date Approved/Denied:   PASRR Number: [id number]  Discharge Plan: SNF    Current Diagnoses: Patient Active Problem List   Diagnosis Date Noted   Lung cancer ([place]) 03/10/2021   Status post robot-assisted surgical procedure 03/09/2021   endometrioid endometrial    Endometrial cancer ([place]) 10/02/2016   Endometrial adenocarcinoma ([place]) 09/10/2016   Essential hypertension    Acute pancreatitis 07/08/2016   Secondary diabetes mellitus ([place]) 07/08/2016   History of partial pancreatectomy 07/08/2016   History of malignant gastrointestinal stromal tumor (GIST) 07/08/2016   Hyperlipidemia 05/14/2016   Exocrine pancreatic insufficiency 06/04/2011   Chest pain 11/20/2010   GIST, malignant T2N0 11/20/2010   Abdominal pain, epigastric 11/20/2010    Orientation RESPIRATION BLADDER Height & Weight     Self, Time, Situation, Place  O2 ([place] 1) Continent, External catheter Weight: 150 lb 5.7 oz (68.2 kg) Height:  5' 5"  (165.1 cm)  BEHAVIORAL SYMPTOMS/MOOD NEUROLOGICAL BOWEL NUTRITION STATUS      Continent Diet (See DC summary)  AMBULATORY STATUS COMMUNICATION OF NEEDS Skin   Extensive Assist Verbally Skin abrasions, Surgical wounds (L. Arm Hematoma, L chest incision)                       Personal Care Assistance Level of Assistance  Bathing, Feeding, Dressing Bathing  Assistance: Maximum assistance Feeding assistance: Limited assistance Dressing Assistance: Maximum assistance     Functional Limitations Info  Sight, Hearing, Speech Sight Info: Adequate Hearing Info: Adequate Speech Info: Adequate    SPECIAL CARE FACTORS FREQUENCY  PT (By licensed PT), OT (By licensed OT)     PT Frequency: 5x a week OT Frequency: 5x a week            Contractures Contractures Info: Not present    Additional Factors Info  Code Status, Allergies, Insulin Sliding Scale Code Status Info: Full Allergies Info: Ibuprofen   Insulin Sliding Scale Info: Insulin Aspart (Novolog) 0-9 U 3x daily w/meals       Current Medications (03/15/2021):  This is the current hospital active medication list Current Facility-Administered Medications  Medication Dose Route Frequency Provider Last Rate Last Admin   0.9 %  sodium chloride infusion (Manually program via Guardrails IV Fluids)   Intravenous Once [name], MD   Held at 03/12/21 1831   0.9 %  sodium chloride infusion   Intravenous PRN [name], MD 10 mL/hr at 03/12/21 1700 Infusion Verify at 03/12/21 1700   amiodarone (NEXTERONE PREMIX) 360-4.14 MG/200ML-% (1.8 mg/mL) IV infusion  60 mg/hr Intravenous Continuous [name], MD 33.3 mL/hr at 03/15/21 1310 60 mg/hr at 03/15/21 1310   Followed by   amiodarone (NEXTERONE PREMIX) 360-4.14 MG/200ML-% (1.8 mg/mL) IV infusion  30 mg/hr Intravenous Continuous [name], MD       bisacodyl (DULCOLAX) EC tablet 10 mg  10 mg Oral Daily Roxan Hockey,  Revonda Standard, MD   10 mg at 03/15/21 1100   chlorhexidine gluconate (MEDLINE KIT) (PERIDEX) 0.12 % solution 15 mL  15 mL Mouth Rinse BID [name], MD   15 mL at 03/14/21 1025   Chlorhexidine Gluconate Cloth 2 % PADS 6 each  6 each Topical Daily [name], MD   6 each at 03/13/21 0939   docusate sodium (COLACE) capsule 100 mg  100 mg Oral BID [name], MD    100 mg at 03/15/21 1101   empagliflozin (JARDIANCE) tablet 10 mg  10 mg Oral Daily [name], MD   10 mg at 03/15/21 1100   fentaNYL (SUBLIMAZE) injection 25-50 mcg  25-50 mcg Intravenous Q2H PRN [name], MD   50 mcg at 03/11/21 0444   furosemide (LASIX) tablet 40 mg  40 mg Oral Daily [name], MD   40 mg at 03/15/21 1101   insulin aspart (novoLOG) injection 0-9 Units  0-9 Units Subcutaneous TID WC [name], MD   5 Units at 03/15/21 1218   ipratropium-albuterol (DUONEB) 0.5-2.5 (3) MG/3ML nebulizer solution 3 mL  3 mL Nebulization Q6H PRN [name], MD       labetalol (NORMODYNE) injection 10 mg  10 mg Intravenous Q2H PRN Noemi Chapel P, DO   10 mg at 03/13/21 1906   lipase/protease/amylase (CREON) capsule 12,000 Units  12,000 Units Oral TID Clemencia Course, MD   12,000 Units at 03/15/21 1101   magic mouthwash w/lidocaine  5 mL Oral TID PRN [name], MD       melatonin tablet 10 mg  10 mg Oral QHS PRN [name], MD   10 mg at 03/13/21 2106   metoprolol tartrate (LOPRESSOR) tablet 50 mg  50 mg Oral BID [name], MD   50 mg at 03/15/21 1100   ondansetron (ZOFRAN) injection 4 mg  4 mg Intravenous Q6H PRN [name], MD       oxyCODONE (Oxy IR/ROXICODONE) immediate release tablet 5-10 mg  5-10 mg Oral Q4H PRN [name], MD   10 mg at 03/14/21 1025   polyethylene glycol (MIRALAX / GLYCOLAX) packet 17 g  17 g Per Tube Daily [name], MD   17 g at 03/14/21 1025   potassium chloride (KLOR-CON) CR tablet 30 mEq  30 mEq Oral BID Lars Pinks M, PA-C   30 mEq at 03/15/21 1100   [START ON 03/16/2021] potassium chloride SA (KLOR-CON) CR tablet 20 mEq  20 mEq Oral Daily Lars Pinks M, PA-C       senna-docusate (Senokot-S) tablet 1 tablet  1 tablet Oral QHS [name], MD   1 tablet at 03/14/21 2035   simvastatin (ZOCOR) tablet 20 mg  20 mg  Oral QHS [name], MD   20 mg at 03/14/21 2035   sucralfate (CARAFATE) tablet 1 g  1 g Oral BID [name], MD   1 g at 03/15/21 1100   traMADol (ULTRAM) tablet 50-100 mg  50-100 mg Oral Q6H PRN [name], MD   100 mg at 03/15/21 1101     Discharge Medications: Please see discharge summary for a list of discharge medications.  Relevant Imaging Results:  Relevant Lab Results:   Additional Information SS# [place], [place]

## 2021-03-16 ENCOUNTER — Inpatient Hospital Stay (HOSPITAL_COMMUNITY): Payer: PPO

## 2021-03-16 LAB — CBC WITH DIFFERENTIAL/PLATELET
Abs Immature Granulocytes: 0.7 10*3/uL — ABNORMAL HIGH (ref 0.00–0.07)
Basophils Absolute: 0.1 10*3/uL (ref 0.0–0.1)
Basophils Relative: 0 %
Eosinophils Absolute: 0.2 10*3/uL (ref 0.0–0.5)
Eosinophils Relative: 1 %
HCT: 28.6 % — ABNORMAL LOW (ref 36.0–46.0)
Hemoglobin: 9.3 g/dL — ABNORMAL LOW (ref 12.0–15.0)
Immature Granulocytes: 6 %
Lymphocytes Relative: 7 %
Lymphs Abs: 0.7 10*3/uL (ref 0.7–4.0)
MCH: 29.8 pg (ref 26.0–34.0)
MCHC: 32.5 g/dL (ref 30.0–36.0)
MCV: 91.7 fL (ref 80.0–100.0)
Monocytes Absolute: 1.3 10*3/uL — ABNORMAL HIGH (ref 0.1–1.0)
Monocytes Relative: 12 %
Neutro Abs: 8.3 10*3/uL — ABNORMAL HIGH (ref 1.7–7.7)
Neutrophils Relative %: 74 %
Platelets: 130 10*3/uL — ABNORMAL LOW (ref 150–400)
RBC: 3.12 MIL/uL — ABNORMAL LOW (ref 3.87–5.11)
RDW: 16.3 % — ABNORMAL HIGH (ref 11.5–15.5)
WBC: 11.2 10*3/uL — ABNORMAL HIGH (ref 4.0–10.5)
nRBC: 0.4 % — ABNORMAL HIGH (ref 0.0–0.2)

## 2021-03-16 LAB — BASIC METABOLIC PANEL
Anion gap: 12 (ref 5–15)
BUN: 17 mg/dL (ref 8–23)
CO2: 28 mmol/L (ref 22–32)
Calcium: 8.1 mg/dL — ABNORMAL LOW (ref 8.9–10.3)
Chloride: 94 mmol/L — ABNORMAL LOW (ref 98–111)
Creatinine, Ser: 0.75 mg/dL (ref 0.44–1.00)
GFR, Estimated: >60 mL/min (ref >60)
Glucose, Bld: 150 mg/dL — ABNORMAL HIGH (ref 70–99)
Potassium: 4.3 mmol/L (ref 3.5–5.1)
Sodium: 134 mmol/L — ABNORMAL LOW (ref 135–145)

## 2021-03-16 LAB — GLUCOSE, CAPILLARY
Glucose-Capillary: 137 mg/dL — ABNORMAL HIGH (ref 70–99)
Glucose-Capillary: 161 mg/dL — ABNORMAL HIGH (ref 70–99)
Glucose-Capillary: 155 mg/dL — ABNORMAL HIGH (ref 70–99)
Glucose-Capillary: 128 mg/dL — ABNORMAL HIGH (ref 70–99)

## 2021-03-16 MED ORDER — AMIODARONE HCL 200 MG PO TABS
200.0000 mg | ORAL_TABLET | Freq: Two times a day (BID) | ORAL | Status: DC
  Administered 2021-03-16 – 2021-03-29 (×27): 200 mg via ORAL
  Filled 2021-03-16 (×27): qty 1

## 2021-03-16 NOTE — Progress Notes (Signed)
Mobility Specialist Progress Note:   03/16/21 1630  Oxygen Therapy  SpO2 98 %  Mobility  Activity Transferred:  Bed to chair  Level of Assistance Moderate assist, patient does 50-74%  Assistive Device Other (Comment) (HHA)  Distance Ambulated (ft) 2 ft  Mobility Out of bed to chair with meals  Mobility Response Tolerated poorly  Mobility performed by Mobility specialist  Bed Position Chair  $Mobility charge 1 Mobility   Pre Mobility: HR 70 bpm; SpO2 98% During Mobility: HR 94 bpm Post Mobility: HR 67 bpm  Session performed on 2LO2. Pt displayed confusion this afternoon, RN notified.   Nelta Numbers Mobility Specialist  Phone [phone]

## 2021-03-16 NOTE — Progress Notes (Addendum)
      [address]       [place] [address]             [phone]       7 Days Post-Op Procedure(s) (LRB): XI ROBOTIC ASSISTED THORASCOPY-LEFT UPPER LOBE SEGMENTECTOMY (Left) INTERCOSTAL NERVE BLOCK (Left) LYMPH NODE DISSECTION (Left) THORACOTOMY MAJOR (Left)  Subjective: Patient asking for help with opening fruit, coffee and putting cream in coffee  Objective: Vital signs in last 24 hours: Temp:  [97.4 F (36.3 C)-98 F (36.7 C)] 97.7 F (36.5 C) (10/20 0338) Pulse Rate:  [61-93] 62 (10/20 0338) Cardiac Rhythm: Normal sinus rhythm (10/20 0340) Resp:  [15-20] 17 (10/20 0338) BP: (134-151)/(52-85) 142/53 (10/20 0338) SpO2:  [97 %-99 %] 99 % (10/20 0338) Weight:  [63 kg] 63 kg (10/20 0500)     Intake/Output from previous day: 10/19 0701 - 10/20 0700 In: 712.7 [P.O.:240; I.V.:412.7] Out: 2015 [Urine:1875; Chest Tube:140]   Physical Exam:  Cardiovascular: RRR Pulmonary: Coarse breath sounds Abdomen: Soft, non tender, bowel sounds present. Extremities: No calf tenderness. Left arm with Kerlex (ecchymosis) Wounds: Clean and dry.  No erythema or signs of infection. Ecchymosis on left Chest Tube: to water seal, no air leak. Bloody drainage  Lab Results: CBC: Recent Labs    03/15/21 0345 03/16/21 0500  WBC 9.9 11.2*  HGB 9.2* 9.3*  HCT 28.1* 28.6*  PLT 84* 130*    BMET:  Recent Labs    03/14/21 0524 03/15/21 0345  NA 134* 127*  K 4.1 3.7  CL 96* 89*  CO2 31 29  GLUCOSE 162* 137*  BUN 13 14  CREATININE 0.70 0.78  CALCIUM 8.1* 7.9*     PT/INR: No results for input(s): LABPROT, INR in the last 72 hours. ABG:  INR: Will add last result for INR, ABG once components are confirmed Will add last 4 CBG results once components are confirmed  Assessment/Plan:  1. CV - She went into a fib with RVR yesterday. SR with HR in the 60's. On Amiodarone drip and Lopressor 50 mg bid. Will transition to oral Amiodarone 200 mg bid 2.  Pulmonary -  On 2 liters of oxygen via [place]. Wean as able. Chest tube with 140 cc last 24 hours;however, when put patient in chair had a bunch of chest tube output. By my mark, she has had over 310 out last 24 hours.  Chest tube is to water seal, no air leak. CXR this am appears stable (small left apical pneumothorax,small right pleural effusion/atelectasis).   Encourage incentive spirometer. Final pathology metastatic endometrial cancer. She will follow up with Dr. Denman George from oncology 3. DM-CBGs 144/142/137. Pre op HGA1C 6.9. On Jardiance. 4. Expected post op blood loss anemia-H and H this am stable at 9.3 and 28.6 5. Thrombocytopenia-platelets this am increased to 130,000. No  signs of active bleeding. 7. History of pancreatic insufficiency-on Creon 8. Hyponatremia-await this am's result 9. Deconditioned-continue PT  Donielle M ZimmermanPA-C 03/16/2021,6:58 AM  Patient seen and examined, agree with above Will dc chest tube this AM She says depth perception is better but just feels really weak  Remo Lipps C. Roxan Hockey, MD [facility] [phone]

## 2021-03-16 NOTE — Progress Notes (Signed)
Physical Therapy Treatment Patient Details Name: [name] MRN: [id number] DOB: [birth year] Today's Date: 03/16/2021   History of Present Illness Pt is a 80yo female who presents on 10/13 for a  XI robotic assisted throascopy and L upper lobe wedge resenction for L upper lobe nodule. PSH: s/p total hysterectomy, whipple procedure, PMH: iron deficiency, osteopenia, endometrial ca,GI bleed, DVT    PT Comments    The pt continues to make slow but steady progress with mobility this session despite limitations from continued pain and endurance deficits. The pt was able to complete x4 sit-stand transfers with minA to facilitate movements at hips as well as VC for hand placement. The pt demos significant fatigue after short bout of ambulation in room and asked for seated rest, HR max of 138bpm during ambulation but returned quickly to <100bpm with seated rest. The pt will continue to benefit from skilled PT for mobility progression and targeted strengthening to improve power, stability, and independence with transfers. Continue to recommend SNF rehab due to level of assist needed and lack of caregiver availability at home.    Recommendations for follow up therapy are one component of a multi-disciplinary discharge planning process, led by the attending physician.  Recommendations may be updated based on patient status, additional functional criteria and insurance authorization.  Follow Up Recommendations  SNF;Supervision/Assistance - 24 hour     Equipment Recommendations  Rolling walker with 5" wheels    Recommendations for Other Services       Precautions / Restrictions Precautions Precautions: Fall;Other (comment) Precaution Comments: L chest tube (removed after session) Restrictions Weight Bearing Restrictions: No     Mobility  Bed Mobility Overal bed mobility: Needs Assistance Bed Mobility: Sit to Supine       Sit to supine: Mod assist   General bed mobility comments:  assist to return BLE to bed, manage lines    Transfers Overall transfer level: Needs assistance Equipment used: Rolling walker (2 wheeled) Transfers: Sit to/from Stand Sit to Stand: Min assist         General transfer comment: minA at hips to boost from bed, cues for hand placement as pt initially attempting to pull on RW to stand  Ambulation/Gait Ambulation/Gait assistance: Min assist;+2 safety/equipment Gait Distance (Feet): 15 Feet Assistive device: Rolling walker (2 wheeled) Gait Pattern/deviations: Step-through pattern;Decreased stride length;Shuffle;Trunk flexed Gait velocity: decr Gait velocity interpretation: <1.31 ft/sec, indicative of household ambulator General Gait Details: pt with significant trunk flexion, needing minA to manage RW att times, not picking head up despite cues. HR to 138bpm after walking from recliner around bed to other side of room and pt asking to rest      Balance Overall balance assessment: Needs assistance Sitting-balance support: Bilateral upper extremity supported;No upper extremity supported Sitting balance-Leahy Scale: Fair     Standing balance support: Bilateral upper extremity supported;During functional activity Standing balance-Leahy Scale: Poor Standing balance comment: walker and min guard for static standing                            Cognition Arousal/Alertness: Awake/alert Behavior During Therapy: Flat affect Overall Cognitive Status: Within Functional Limits for tasks assessed                                 General Comments: pt with flat affect, agreeable but fatigued and asking to rest  Exercises      General Comments General comments (skin integrity, edema, etc.): HR to max of 138 bpm with ambulation, returned to 50s with rest. SpO2 unable to read during gait, 98% on 3L at start and end of session with good reading      Pertinent Vitals/Pain Pain Assessment: Faces Faces Pain Scale:  Hurts little more Pain Location: chest tube site Pain Descriptors / Indicators: Grimacing;Guarding Pain Intervention(s): Limited activity within patient's tolerance;Monitored during session;Repositioned     PT Goals (current goals can now be found in the care plan section) Acute Rehab PT Goals Patient Stated Goal: to get better PT Goal Formulation: With patient Time For Goal Achievement: 03/27/21 Potential to Achieve Goals: Good Progress towards PT goals: Progressing toward goals    Frequency    Min 3X/week      PT Plan Current plan remains appropriate       AM-PAC PT "6 Clicks" Mobility   Outcome Measure  Help needed turning from your back to your side while in a flat bed without using bedrails?: A Lot Help needed moving from lying on your back to sitting on the side of a flat bed without using bedrails?: A Lot Help needed moving to and from a bed to a chair (including a wheelchair)?: A Little Help needed standing up from a chair using your arms (e.g., wheelchair or bedside chair)?: A Little Help needed to walk in hospital room?: A Little Help needed climbing 3-5 steps with a railing? : Total 6 Click Score: 14    End of Session Equipment Utilized During Treatment: Gait belt;Oxygen Activity Tolerance: Patient limited by fatigue Patient left: in bed;with call bell/phone within reach;with bed alarm set Nurse Communication: Mobility status PT Visit Diagnosis: Unsteadiness on feet (R26.81);Muscle weakness (generalized) (M62.81);Difficulty in walking, not elsewhere classified (R26.2)     Time: 6811-5726 PT Time Calculation (min) (ACUTE ONLY): 22 min  Charges:  $Gait Training: 8-22 mins                     West Carbo, PT, DPT   Acute Rehabilitation Department Pager #: [phone]   Sandra Cockayne 03/16/2021, 9:11 AM

## 2021-03-16 NOTE — Care Management Important Message (Signed)
Important Message  Patient Details  Name: [name] MRN: [id number] Date of Birth: 1941/04/13   Medicare Important Message Given:  Yes     [name] 03/16/2021, 2:07 PM

## 2021-03-17 ENCOUNTER — Inpatient Hospital Stay (HOSPITAL_COMMUNITY): Payer: PPO

## 2021-03-17 LAB — GLUCOSE, CAPILLARY
Glucose-Capillary: 118 mg/dL — ABNORMAL HIGH (ref 70–99)
Glucose-Capillary: 164 mg/dL — ABNORMAL HIGH (ref 70–99)
Glucose-Capillary: 156 mg/dL — ABNORMAL HIGH (ref 70–99)
Glucose-Capillary: 211 mg/dL — ABNORMAL HIGH (ref 70–99)

## 2021-03-17 LAB — BASIC METABOLIC PANEL
Anion gap: 14 (ref 5–15)
BUN: 18 mg/dL (ref 8–23)
CO2: 26 mmol/L (ref 22–32)
Calcium: 8.2 mg/dL — ABNORMAL LOW (ref 8.9–10.3)
Chloride: 92 mmol/L — ABNORMAL LOW (ref 98–111)
Creatinine, Ser: 0.82 mg/dL (ref 0.44–1.00)
GFR, Estimated: >60 mL/min (ref >60)
Glucose, Bld: 136 mg/dL — ABNORMAL HIGH (ref 70–99)
Potassium: 4.2 mmol/L (ref 3.5–5.1)
Sodium: 132 mmol/L — ABNORMAL LOW (ref 135–145)

## 2021-03-17 LAB — CBC WITH DIFFERENTIAL/PLATELET
Abs Immature Granulocytes: 0.64 10*3/uL — ABNORMAL HIGH (ref 0.00–0.07)
Basophils Absolute: 0 10*3/uL (ref 0.0–0.1)
Basophils Relative: 0 %
Eosinophils Absolute: 0.1 10*3/uL (ref 0.0–0.5)
Eosinophils Relative: 1 %
HCT: 31 % — ABNORMAL LOW (ref 36.0–46.0)
Hemoglobin: 9.8 g/dL — ABNORMAL LOW (ref 12.0–15.0)
Immature Granulocytes: 4 %
Lymphocytes Relative: 5 %
Lymphs Abs: 0.7 10*3/uL (ref 0.7–4.0)
MCH: 29.3 pg (ref 26.0–34.0)
MCHC: 31.6 g/dL (ref 30.0–36.0)
MCV: 92.5 fL (ref 80.0–100.0)
Monocytes Absolute: 1.1 10*3/uL — ABNORMAL HIGH (ref 0.1–1.0)
Monocytes Relative: 7 %
Neutro Abs: 13 10*3/uL — ABNORMAL HIGH (ref 1.7–7.7)
Neutrophils Relative %: 83 %
Platelets: 231 10*3/uL (ref 150–400)
RBC: 3.35 MIL/uL — ABNORMAL LOW (ref 3.87–5.11)
RDW: 16.6 % — ABNORMAL HIGH (ref 11.5–15.5)
WBC: 15.5 10*3/uL — ABNORMAL HIGH (ref 4.0–10.5)
nRBC: 0 % (ref 0.0–0.2)

## 2021-03-17 NOTE — Progress Notes (Addendum)
.  logtocts 8 Days Post-Op Procedure(s) (LRB): XI ROBOTIC ASSISTED THORASCOPY-LEFT UPPER LOBE SEGMENTECTOMY (Left) INTERCOSTAL NERVE BLOCK (Left) LYMPH NODE DISSECTION (Left) THORACOTOMY MAJOR (Left)  Subjective:  Up eating breakfast.  Denies shortness of breath.  States she is doing okay.  Visual disturbances improved  + BM  Objective: Vital signs in last 24 hours: Temp:  [97.7 F (36.5 C)-99.2 F (37.3 C)] 98.7 F (37.1 C) (10/21 0747) Pulse Rate:  [64-78] 78 (10/21 0747) Cardiac Rhythm: Normal sinus rhythm (10/21 0725) Resp:  [13-20] 18 (10/21 0747) BP: (111-144)/(49-72) 137/49 (10/21 0747) SpO2:  [95 %-99 %] 98 % (10/21 0747) Weight:  [62.7 kg] 62.7 kg (10/21 0539)  Intake/Output from previous day: 10/20 0701 - 10/21 0700 In: 16.7 [I.V.:16.7] Out: 700 [Urine:700]  General appearance: alert, cooperative, and no distress Heart: regular rate and rhythm Lungs: diminished breath sounds left base Abdomen: soft, non-tender; bowel sounds normal; no masses,  no organomegaly Extremities: extremities normal, atraumatic, no cyanosis or edema Wound: clean and dry, ecchymosis  Lab Results: Recent Labs    03/16/21 0500 03/17/21 0500  WBC 11.2* 15.5*  HGB 9.3* 9.8*  HCT 28.6* 31.0*  PLT 130* 231   BMET:  Recent Labs    03/16/21 0500 03/17/21 0500  NA 134* 132*  K 4.3 4.2  CL 94* 92*  CO2 28 26  GLUCOSE 150* 136*  BUN 17 18  CREATININE 0.75 0.82  CALCIUM 8.1* 8.2*    PT/INR: No results for input(s): LABPROT, INR in the last 72 hours. ABG    Component Value Date/Time   PHART 7.418 03/10/2021 0541   HCO3 20.4 03/10/2021 0541   TCO2 21 (L) 03/10/2021 0541   ACIDBASEDEF 3.0 (H) 03/10/2021 0541   O2SAT 100.0 03/10/2021 0541   CBG (last 3)  Recent Labs    03/16/21 1619 03/16/21 2131 03/17/21 0612  GLUCAP 155* 128* 118*    Assessment/Plan: S/P Procedure(s) (LRB): XI ROBOTIC ASSISTED THORASCOPY-LEFT UPPER LOBE SEGMENTECTOMY (Left) INTERCOSTAL NERVE BLOCK  (Left) LYMPH NODE DISSECTION (Left) THORACOTOMY MAJOR (Left)  CV- PAF, currently in NSR- on oral Amiodarone, Lopressor Pulm- CT removed yesterday, weaning oxygen as tolerated, CXR shows increase left pleural effusion, increase in atelectasis, continue flutter valve DM- sugars well controlled Expected blood loss anemia- Hgb at 9.8 Thrombocytopenia- improving up to 231 Hyponatremia- improving, up to 132  H/O Pancreatic insufficiency on Creon Neuro- vision field deficits are much improved Deconditioning- needs SNF Dispo- patient stable, increase in left pleural effusion on CXR post chest tube removal, otherwise maintaining NSR, hyponatremia improving, will discuss CXR findings with Dr. Roxan Hockey, she is not ready for SNF discharge today   LOS: 8 days   Ellwood Handler, PA-C 03/17/2021  Patient seen and examined, agree with above CXR looks a little worse with some atelectasis and effusion. Recheck in AM, if effusion increases may need to have IR place a pigtail  Remo Lipps C. Roxan Hockey, MD [facility] [phone]

## 2021-03-17 NOTE — Progress Notes (Signed)
Physical Therapy Treatment Patient Details Name: [name] MRN: [id number] DOB: [birth year] Today's Date: 03/17/2021   History of Present Illness Pt is a 80 y.o. female admitted 03/09/21 for XI robotic-assisted thorascopy and LUL segmentectomy, L major thoracotomy. Chest tube removed 10/20; CXR shows increase L pleural effusion, increase in atelectasis. PMH includes osteopenia, DVT, GIB, whipple.   PT Comments    Pt slowly progressing with mobility. Today's session focused on transfer and gait training with RW, pt requiring minA for limited mobility, DOE 4/4 with brief bouts of activity. Pt remains limited by generalized weakness and significantly decreased activity tolerance. Continue to recommend SNF-level therapies to maximize functional mobility and independence prior to return home.  SpO2 >/92% on 1L O2 [place]    Recommendations for follow up therapy are one component of a multi-disciplinary discharge planning process, led by the attending physician.  Recommendations may be updated based on patient status, additional functional criteria and insurance authorization.  Follow Up Recommendations  SNF;Supervision for mobility/OOB     Equipment Recommendations  Rolling walker with 5" wheels;3in1 (PT)    Recommendations for Other Services       Precautions / Restrictions Precautions Precautions: Fall;Other (comment) Precaution Comments: Watch vitals - does not wear home O2 Restrictions Weight Bearing Restrictions: No     Mobility  Bed Mobility Overal bed mobility: Needs Assistance Bed Mobility: Supine to Sit     Supine to sit: Mod assist;HOB elevated     General bed mobility comments: ModA for trunk elevation and scooting hips to EOB    Transfers Overall transfer level: Needs assistance Equipment used: Rolling walker (2 wheeled) Transfers: Sit to/from Stand Sit to Stand: Min assist         General transfer comment: Multiple sit<>stands from EOB to RW with  minA, repeated verbal cues for hand placement  Ambulation/Gait Ambulation/Gait assistance: Min assist;+2 safety/equipment Gait Distance (Feet): 4 Feet (+12) Assistive device: Rolling walker (2 wheeled) Gait Pattern/deviations: Step-to pattern;Shuffle;Trunk flexed Gait velocity: Decreased   General Gait Details: Slow, shuffling gait with significant trunk flexion, minA for stability and RW management; pt maintaining forward flexed posture despite verbal cues for upright; pt ambulating 4' forwards/backwards, asking for seated rest secondary to SOB, then walking an additional 12' with max encouragement; limited by fatigue, pain and SOB   Stairs             Wheelchair Mobility    Modified Rankin (Stroke Patients Only)       Balance Overall balance assessment: Needs assistance Sitting-balance support: No upper extremity supported;Feet supported Sitting balance-Leahy Scale: Fair     Standing balance support: Bilateral upper extremity supported;During functional activity Standing balance-Leahy Scale: Poor Standing balance comment: Reliant on UE support, min guard                            Cognition Arousal/Alertness: Awake/alert Behavior During Therapy: Flat affect;Anxious Overall Cognitive Status: No family/caregiver present to determine baseline cognitive functioning                                 General Comments: Cognition WFL for simple tasks; pt anxious regarding mobility and difficulty breathing requiring frequent cues for pursed lip breathing, requiring max encouragement to progress mobility OOB      Exercises      General Comments General comments (skin integrity, edema, etc.): SpO2 >/92% on 1L O2 [place]  Pertinent Vitals/Pain Pain Assessment: Faces Faces Pain Scale: Hurts a little bit Pain Location: L-side back at incision Pain Descriptors / Indicators: Grimacing;Guarding Pain Intervention(s): Monitored during session;Limited  activity within patient's tolerance    Home Living                      Prior Function            PT Goals (current goals can now be found in the care plan section) Progress towards PT goals: Progressing toward goals    Frequency    Min 2X/week      PT Plan Frequency needs to be updated    Co-evaluation              AM-PAC PT "6 Clicks" Mobility   Outcome Measure  Help needed turning from your back to your side while in a flat bed without using bedrails?: A Lot Help needed moving from lying on your back to sitting on the side of a flat bed without using bedrails?: A Lot Help needed moving to and from a bed to a chair (including a wheelchair)?: A Little Help needed standing up from a chair using your arms (e.g., wheelchair or bedside chair)?: A Little Help needed to walk in hospital room?: A Little Help needed climbing 3-5 steps with a railing? : A Lot 6 Click Score: 15    End of Session Equipment Utilized During Treatment: Gait belt;Oxygen Activity Tolerance: Patient limited by fatigue Patient left: in chair;with call bell/phone within reach;with chair alarm set Nurse Communication: Mobility status PT Visit Diagnosis: Unsteadiness on feet (R26.81);Muscle weakness (generalized) (M62.81);Difficulty in walking, not elsewhere classified (R26.2)     Time: 2330-0762 PT Time Calculation (min) (ACUTE ONLY): 24 min  Charges:  $Therapeutic Activity: 23-37 mins                     Mabeline Caras, PT, DPT Acute Rehabilitation Services  Pager [phone] Office [place] 03/17/2021, 12:13 PM

## 2021-03-17 NOTE — TOC Transition Note (Signed)
Transition of Care [facility]) - CM/SW Discharge Note   Patient Details  Name: [name] MRN: [id number] Date of Birth: [birth year]  [facility] CM/SW Contact:  Tresa Endo Phone Number: 03/17/2021, 12:17 PM   Clinical Narrative:    CSW following, pt not medically ready for DC but [facility] inpatient rehab in [facility] has offered a bed when medically stable. CSW will continue following.   Final next level of care: Skilled Nursing Facility Barriers to Discharge: Continued Medical Work up   Patient Goals and CMS Choice Patient states their goals for this hospitalization and ongoing recovery are:: Rehab CMS Medicare.gov Compare Post Acute Care list provided to:: Patient Choice offered to / list presented to : Patient  Discharge Placement                       Discharge Plan and Services In-house Referral: Clinical Social Work   Post Acute Care Choice: [place]                               Social Determinants of Health (SDOH) Interventions     Readmission Risk Interventions No flowsheet data found.

## 2021-03-17 NOTE — Plan of Care (Signed)

## 2021-03-18 ENCOUNTER — Inpatient Hospital Stay (HOSPITAL_COMMUNITY): Payer: PPO

## 2021-03-18 LAB — CBC WITH DIFFERENTIAL/PLATELET
Abs Immature Granulocytes: 0.33 10*3/uL — ABNORMAL HIGH (ref 0.00–0.07)
Basophils Absolute: 0 10*3/uL (ref 0.0–0.1)
Basophils Relative: 0 %
Eosinophils Absolute: 0.1 10*3/uL (ref 0.0–0.5)
Eosinophils Relative: 0 %
HCT: 28 % — ABNORMAL LOW (ref 36.0–46.0)
Hemoglobin: 9.1 g/dL — ABNORMAL LOW (ref 12.0–15.0)
Immature Granulocytes: 2 %
Lymphocytes Relative: 4 %
Lymphs Abs: 0.7 10*3/uL (ref 0.7–4.0)
MCH: 29.4 pg (ref 26.0–34.0)
MCHC: 32.5 g/dL (ref 30.0–36.0)
MCV: 90.3 fL (ref 80.0–100.0)
Monocytes Absolute: 1.2 10*3/uL — ABNORMAL HIGH (ref 0.1–1.0)
Monocytes Relative: 7 %
Neutro Abs: 14.8 10*3/uL — ABNORMAL HIGH (ref 1.7–7.7)
Neutrophils Relative %: 87 %
Platelets: 257 10*3/uL (ref 150–400)
RBC: 3.1 MIL/uL — ABNORMAL LOW (ref 3.87–5.11)
RDW: 17 % — ABNORMAL HIGH (ref 11.5–15.5)
WBC: 17 10*3/uL — ABNORMAL HIGH (ref 4.0–10.5)
nRBC: 0 % (ref 0.0–0.2)

## 2021-03-18 LAB — BASIC METABOLIC PANEL
Anion gap: 10 (ref 5–15)
BUN: 18 mg/dL (ref 8–23)
CO2: 29 mmol/L (ref 22–32)
Calcium: 8.2 mg/dL — ABNORMAL LOW (ref 8.9–10.3)
Chloride: 95 mmol/L — ABNORMAL LOW (ref 98–111)
Creatinine, Ser: 0.85 mg/dL (ref 0.44–1.00)
GFR, Estimated: >60 mL/min (ref >60)
Glucose, Bld: 195 mg/dL — ABNORMAL HIGH (ref 70–99)
Potassium: 3.5 mmol/L (ref 3.5–5.1)
Sodium: 134 mmol/L — ABNORMAL LOW (ref 135–145)

## 2021-03-18 LAB — GLUCOSE, CAPILLARY
Glucose-Capillary: 172 mg/dL — ABNORMAL HIGH (ref 70–99)
Glucose-Capillary: 266 mg/dL — ABNORMAL HIGH (ref 70–99)
Glucose-Capillary: 134 mg/dL — ABNORMAL HIGH (ref 70–99)

## 2021-03-18 MED ORDER — POTASSIUM CHLORIDE CRYS ER 20 MEQ PO TBCR
20.0000 meq | EXTENDED_RELEASE_TABLET | Freq: Every day | ORAL | Status: DC
  Administered 2021-03-19 – 2021-03-22 (×4): 20 meq via ORAL
  Filled 2021-03-18 (×4): qty 1

## 2021-03-18 MED ORDER — POTASSIUM CHLORIDE CRYS ER 20 MEQ PO TBCR
30.0000 meq | EXTENDED_RELEASE_TABLET | Freq: Two times a day (BID) | ORAL | Status: AC
  Administered 2021-03-18 (×2): 30 meq via ORAL
  Filled 2021-03-18 (×2): qty 1

## 2021-03-18 NOTE — Progress Notes (Signed)
TCTS PA made aware about  oxy making her confused and caused her hallucination , claimed to d/c said med.

## 2021-03-18 NOTE — Progress Notes (Addendum)
      [address]       [place] [address]             [phone]       9 Days Post-Op Procedure(s) (LRB): XI ROBOTIC ASSISTED THORASCOPY-LEFT UPPER LOBE SEGMENTECTOMY (Left) INTERCOSTAL NERVE BLOCK (Left) LYMPH NODE DISSECTION (Left) THORACOTOMY MAJOR (Left)  Subjective: Patient thinks her breathing is better this am. Requesting to stop Oxy as makes her hallucinate  Objective: Vital signs in last 24 hours: Temp:  [97.6 F (36.4 C)-98.8 F (37.1 C)] 97.8 F (36.6 C) (10/22 0725) Pulse Rate:  [72-85] 80 (10/22 0800) Cardiac Rhythm: Normal sinus rhythm (10/22 0800) Resp:  [20-24] 20 (10/22 0725) BP: (104-140)/(46-65) 104/59 (10/22 0725) SpO2:  [95 %-98 %] 98 % (10/22 0800) Weight:  [60.9 kg] 60.9 kg (10/22 0435)     Intake/Output from previous day: 10/21 0701 - 10/22 0700 In: 57972 [P.O.:24130] Out: 500 [Urine:500]   Physical Exam:  Cardiovascular: RRR Pulmonary: Coarse breath sounds Abdomen: Soft, non tender, bowel sounds present. Extremities: No calf tenderness. Left side/flank and left arm with ecchymosis Wounds: Clean and dry.  No erythema or signs of infection. Ecchymosis on left   Lab Results: CBC: Recent Labs    03/17/21 0500 03/18/21 0500  WBC 15.5* 17.0*  HGB 9.8* 9.1*  HCT 31.0* 28.0*  PLT 231 257    BMET:  Recent Labs    03/17/21 0500 03/18/21 0500  NA 132* 134*  K 4.2 3.5  CL 92* 95*  CO2 26 29  GLUCOSE 136* 195*  BUN 18 18  CREATININE 0.82 0.85  CALCIUM 8.2* 8.2*     PT/INR: No results for input(s): LABPROT, INR in the last 72 hours. ABG:  INR: Will add last result for INR, ABG once components are confirmed Will add last 4 CBG results once components are confirmed  Assessment/Plan:  1. CV - Pevious a fib with RVR.Maintaining SR with HR in the 60's. On Amiodarone 200 mg bid and Lopressor 50 mg bid.  2.  Pulmonary - On room air. CXR this am appears to show worsening aeration on left, small b/l pleural  effusions. Hopefully, she does not need a chest tube for further drainage. Encourage incentive spirometer. Final pathology metastatic endometrial cancer. She will follow up with Dr. Denman George from oncology 3. DM-CBGs 156/211/172. Pre op HGA1C 6.9. On Jardiance. 4. Expected post op blood loss anemia-H and H this am stable at 9.1 and 28. 5. Thrombocytopenia resolved-platelets this am increased to 257,000. 7. History of pancreatic insufficiency-on Creon 8. Hyponatremia-sodium improved to 134 9. Leukocytosis-WBC up to 17,000. No sign of wound infection, no fever, and no GU complaints. As discussed with Dr. Cyndia Bent, monitor for now 10. Supplement potassium 11. Deconditioned-continue PT 12. Stop Oxy  Donielle M ZimmermanPA-C 03/18/2021,9:37 AM   Chart reviewed, patient examined, agree with above. She denies any SOB or chest pain. Feels like she is doing well. CXR this am may show slightly more opacity on the left but not significantly different to me. Hgb has been stable so I don't think she has any further bleeding. Will repeat CXR in am. If any worsening will get chest CT to evaluate. Discussed with her daughter at bedside.

## 2021-03-19 ENCOUNTER — Inpatient Hospital Stay (HOSPITAL_COMMUNITY): Payer: PPO

## 2021-03-19 LAB — GLUCOSE, CAPILLARY
Glucose-Capillary: 181 mg/dL — ABNORMAL HIGH (ref 70–99)
Glucose-Capillary: 142 mg/dL — ABNORMAL HIGH (ref 70–99)
Glucose-Capillary: 161 mg/dL — ABNORMAL HIGH (ref 70–99)
Glucose-Capillary: 192 mg/dL — ABNORMAL HIGH (ref 70–99)
Glucose-Capillary: 185 mg/dL — ABNORMAL HIGH (ref 70–99)

## 2021-03-19 NOTE — Progress Notes (Addendum)
      [address]       [place] [address]             [phone]       10 Days Post-Op Procedure(s) (LRB): XI ROBOTIC ASSISTED THORASCOPY-LEFT UPPER LOBE SEGMENTECTOMY (Left) INTERCOSTAL NERVE BLOCK (Left) LYMPH NODE DISSECTION (Left) THORACOTOMY MAJOR (Left)  Subjective: Patient thinks her breathing is good this am.  Objective: Vital signs in last 24 hours: Temp:  [97.8 F (36.6 C)-98.6 F (37 C)] 98.1 F (36.7 C) (10/23 0425) Pulse Rate:  [63-95] 79 (10/23 0425) Cardiac Rhythm: Normal sinus rhythm (10/23 0700) Resp:  [19-23] 20 (10/23 0425) BP: (129-144)/(45-71) 129/45 (10/23 0425) SpO2:  [90 %-100 %] 100 % (10/23 0425) Weight:  [62.9 kg] 62.9 kg (10/23 0425)     Intake/Output from previous day: 10/22 0701 - 10/23 0700 In: -  Out: 450 [Urine:450]   Physical Exam:  Cardiovascular: RRR Pulmonary: Slightly diminished left base;otherwise, clear Abdomen: Soft, non tender, bowel sounds present. Extremities: No calf tenderness. Left side/flank and left arm with ecchymosis Wounds: Clean and dry.  No erythema or signs of infection. Ecchymosis on left   Lab Results: CBC: Recent Labs    03/17/21 0500 03/18/21 0500  WBC 15.5* 17.0*  HGB 9.8* 9.1*  HCT 31.0* 28.0*  PLT 231 257    BMET:  Recent Labs    03/17/21 0500 03/18/21 0500  NA 132* 134*  K 4.2 3.5  CL 92* 95*  CO2 26 29  GLUCOSE 136* 195*  BUN 18 18  CREATININE 0.82 0.85  CALCIUM 8.2* 8.2*     PT/INR: No results for input(s): LABPROT, INR in the last 72 hours. ABG:  INR: Will add last result for INR, ABG once components are confirmed Will add last 4 CBG results once components are confirmed  Assessment/Plan:  1. CV - Pevious a fib with RVR. Continues to maintain SR. On Amiodarone 200 mg bid and Lopressor 50 mg bid.  2.  Pulmonary - On room air. CXR this am shows decrease in left pleural effusion, improved aeration on left.  Encourage incentive spirometer. Final  pathology metastatic endometrial cancer. She will follow up with Dr. Denman George from oncology 3. DM-CBGs 266/134/142. Pre op HGA1C 6.9. On Jardiance. 4. Expected post op blood loss anemia-H and H this am stable at 9.1 and 28. 5. History of pancreatic insufficiency-on Creon 6. Hyponatremia-sodium improved to 134 7. Leukocytosis-Last WBC up to 17,000. No sign of wound infection, no fever, and no GU complaints. Will recheck WBC in am. As discussed with Dr. Cyndia Bent, monitor for now 8. Deconditioned-continue PT 9. Hopefully, to SNF soon   Donielle M ZimmermanPA-C 03/19/2021,8:56 AM    Chart reviewed, patient examined, agree with above. She feels well. CXR shows much improved aeration on left. Continue flutter valve and do 2V CXR in am.

## 2021-03-19 NOTE — Plan of Care (Signed)
  Problem: Clinical Measurements: Goal: Ability to maintain clinical measurements within normal limits will improve 03/19/2021 0554 by Trellis Moment, RN Outcome: Progressing Goal: Will remain free from infection 03/19/2021 0554 by Trellis Moment, RN Outcome: Progressing Goal: Diagnostic test results will improve 03/19/2021 0554 by Trellis Moment, RN Outcome: Progressing Goal: Respiratory complications will improve 03/19/2021 0554 by Trellis Moment, RN Outcome: Progressing Goal: Cardiovascular complication will be avoided 03/19/2021 0554 by Trellis Moment, RN Outcome: Progressing   Problem: Activity: Goal: Risk for activity intolerance will decrease 03/19/2021 0554 by Trellis Moment, RN Outcome: Progressing   Problem: Nutrition: Goal: Adequate nutrition will be maintained 03/19/2021 0554 by Trellis Moment, RN Outcome: Progressing   Problem: Coping: Goal: Level of anxiety will decrease 03/19/2021 0554 by Trellis Moment, RN Outcome: Progressing   Problem: Elimination: Goal: Will not experience complications related to bowel motility 03/19/2021 0554 by Trellis Moment, RN Outcome: Progressing Goal: Will not experience complications related to urinary retention 03/19/2021 0554 by Trellis Moment, RN Outcome: Progressing   Problem: Pain Managment: Goal: General experience of comfort will improve 03/19/2021 0554 by Trellis Moment, RN Outcome: Progressing   Problem: Safety: Goal: Ability to remain free from injury will improve 03/19/2021 0554 by Trellis Moment, RN Outcome: Progressing

## 2021-03-20 ENCOUNTER — Inpatient Hospital Stay (HOSPITAL_COMMUNITY): Payer: PPO

## 2021-03-20 LAB — CBC
HCT: 29.4 % — ABNORMAL LOW (ref 36.0–46.0)
Hemoglobin: 9.1 g/dL — ABNORMAL LOW (ref 12.0–15.0)
MCH: 29.4 pg (ref 26.0–34.0)
MCHC: 31 g/dL (ref 30.0–36.0)
MCV: 94.8 fL (ref 80.0–100.0)
Platelets: 299 10*3/uL (ref 150–400)
RBC: 3.1 MIL/uL — ABNORMAL LOW (ref 3.87–5.11)
RDW: 17 % — ABNORMAL HIGH (ref 11.5–15.5)
WBC: 13.3 10*3/uL — ABNORMAL HIGH (ref 4.0–10.5)
nRBC: 0 % (ref 0.0–0.2)

## 2021-03-20 LAB — GLUCOSE, CAPILLARY
Glucose-Capillary: 146 mg/dL — ABNORMAL HIGH (ref 70–99)
Glucose-Capillary: 144 mg/dL — ABNORMAL HIGH (ref 70–99)
Glucose-Capillary: 172 mg/dL — ABNORMAL HIGH (ref 70–99)

## 2021-03-20 MED ORDER — ALTEPLASE 2 MG IJ SOLR
2.0000 mg | Freq: Once | INTRAMUSCULAR | Status: AC
  Administered 2021-03-20: 2 mg
  Filled 2021-03-20: qty 2

## 2021-03-20 NOTE — TOC Progression Note (Signed)
Transition of Care [facility]) - Progression Note    Patient Details  Name: [name] MRN: [id number] Date of Birth: [birth year]  [facility] CM/SW Contact  Reece Agar, [place] Phone Number: 03/20/2021, 2:44 PM  Clinical Narrative:    [facility] in [place] has accepted pt. CSW shared the CIR and IDR to pt and family and they would like [facility] inpatient bc of the distance to their home. Janett Billow at [place] will begin insurance auth and a covid test has been requested.    Expected Discharge Plan: [place] Barriers to Discharge: Continued Medical Work up  Expected Discharge Plan and Services Expected Discharge Plan: [place] In-house Referral: Clinical Social Work   Post Acute Care Choice: [place] Living arrangements for the past 2 months: Single Family Home                                       Social Determinants of Health (SDOH) Interventions    Readmission Risk Interventions No flowsheet data found.

## 2021-03-20 NOTE — Progress Notes (Signed)
Spoke to Dr. Kipp Brood notified unable to aspirate TPA after allowing to dwell in white lumen of central line for 4 hours. Dr. Kipp Brood gave orders to pull central line and place PIV. Order written. Fran Lowes, RN VAST

## 2021-03-20 NOTE — Progress Notes (Signed)
Mobility Specialist Progress Note:   03/20/21 1000  Therapy Vitals  Pulse Rate 85  Mobility  Activity Ambulated in room  Level of Assistance Contact guard assist, steadying assist  Assistive Device Front wheel walker  Distance Ambulated (ft) 10 ft  Mobility Ambulated with assistance in room  Mobility Response Tolerated well  Mobility performed by Mobility specialist  $Mobility charge 1 Mobility   Pre Mobility: HR 85 bpm During Mobility: HR 98 bpm Post Mobility: HR 87 bpm  Pt c/o generalized weakness, but felt stronger today. Distance limited d/t not wanting to "over-do it" for PT this afternoon. Session completed on RA.  Nelta Numbers Mobility Specialist  Phone [phone]

## 2021-03-20 NOTE — Progress Notes (Signed)
  Inpatient Rehab Admissions Coordinator :  Per therapy change in recommendations, patient was screened for CIR candidacy by Danne Baxter RN MSN.  Noted family preference for [facility]. They have offered a bed pending [employer] approval. We will not follow. Please call me with any questions.  Danne Baxter RN MSN Admissions Coordinator [phone]

## 2021-03-20 NOTE — Progress Notes (Signed)
Paged Dr. Kipp Brood regarding flushing white lumen that has TPA instilled into line. Unable to get blood return after 4hrs of dwell time.

## 2021-03-20 NOTE — Progress Notes (Signed)
Physical Therapy Treatment Patient Details Name: [name] MRN: [id number] DOB: 12-03-1940 Today's Date: 03/20/2021   History of Present Illness Pt is a 80 y.o. female admitted 03/09/21 for XI robotic-assisted thorascopy and LUL segmentectomy, L major thoracotomy. Chest tube removed 10/20; CXR shows increase L pleural effusion, increase in atelectasis. PMH includes osteopenia, DVT, GIB, whipple.    PT Comments    The pt continues to present with slow but steady progress this session. Despite mobility earlier this morning, the pt was able to complete short bout of ambulation in the room with less fatigue and complete exercises for BLE following gait. All VSS with gait on RA at this time, but the pt does report 2/4 DOE with the activity. Recovered well with seated rest and guided breathing before progression to other exercises.   The pt's daughter was present and states they have been in discussion with inpatient rehab through [facility] and that they are interested in this option. Given the pt's improved activity tolerance at this time, I feel this would be a good plan to maximize functional recovery and independence.     Recommendations for follow up therapy are one component of a multi-disciplinary discharge planning process, led by the attending physician.  Recommendations may be updated based on patient status, additional functional criteria and insurance authorization.  Follow Up Recommendations  Acute inpatient rehab (3hours/day)     Assistance Recommended at Discharge Frequent or constant Supervision/Assistance  Equipment Recommendations  Rolling walker (2 wheels)    Recommendations for Other Services       Precautions / Restrictions Precautions Precautions: Fall;Other (comment) Precaution Comments: Watch vitals - does not wear home O2 Restrictions Weight Bearing Restrictions: No     Mobility  Bed Mobility Overal bed mobility: Needs Assistance Bed Mobility:  Supine to Sit;Rolling Rolling: Min assist   Supine to sit: Min assist     General bed mobility comments: minA to complete. increased time with cues for trunk elevation    Transfers Overall transfer level: Needs assistance Equipment used: Rolling walker (2 wheels) Transfers: Sit to/from Stand Sit to Stand: Min assist;Min guard           General transfer comment: repeated sit-stand from recliner for increased practice.    Ambulation/Gait Ambulation/Gait assistance: Min assist Gait Distance (Feet): 12 Feet Assistive device: Rolling walker (2 wheels) Gait Pattern/deviations: Step-through pattern;Decreased stride length;Trunk flexed Gait velocity: Decreased Gait velocity interpretation: <1.31 ft/sec, indicative of household ambulator General Gait Details: pt slow but steady, continues with minimal step clearance but improved from prior sessions. reports less fatigue following ambulation at this time       Balance Overall balance assessment: Needs assistance Sitting-balance support: No upper extremity supported;Feet supported Sitting balance-Leahy Scale: Fair     Standing balance support: Bilateral upper extremity supported;During functional activity Standing balance-Leahy Scale: Poor Standing balance comment: Reliant on UE support, min guard                            Cognition Arousal/Alertness: Awake/alert Behavior During Therapy: Flat affect;Anxious Overall Cognitive Status: No family/caregiver present to determine baseline cognitive functioning                                 General Comments: Cognition WFL for simple tasks; pt anxious regarding mobility and difficulty breathing requiring frequent cues for pursed lip breathing, requiring max encouragement to progress  mobility OOB        Exercises Other Exercises Other Exercises: sit-stand x4 from recliner. with use of UE, x1 with UE on knees. pt then needing minA    General Comments  General comments (skin integrity, edema, etc.): VSS on RA      Pertinent Vitals/Pain Pain Assessment: No/denies pain Pain Descriptors / Indicators: Grimacing;Guarding Pain Intervention(s): Monitored during session     PT Goals (current goals can now be found in the care plan section) Acute Rehab PT Goals Patient Stated Goal: to get better PT Goal Formulation: With patient Time For Goal Achievement: 03/27/21 Potential to Achieve Goals: Good Progress towards PT goals: Progressing toward goals    Frequency    Min 3X/week      PT Plan Discharge plan needs to be updated       AM-PAC PT "6 Clicks" Mobility   Outcome Measure  Help needed turning from your back to your side while in a flat bed without using bedrails?: A Little Help needed moving from lying on your back to sitting on the side of a flat bed without using bedrails?: A Little Help needed moving to and from a bed to a chair (including a wheelchair)?: A Little Help needed standing up from a chair using your arms (e.g., wheelchair or bedside chair)?: A Little Help needed to walk in hospital room?: A Little Help needed climbing 3-5 steps with a railing? : A Lot 6 Click Score: 17    End of Session Equipment Utilized During Treatment: Gait belt Activity Tolerance: Patient limited by fatigue Patient left: in chair;with call bell/phone within reach;with chair alarm set Nurse Communication: Mobility status PT Visit Diagnosis: Unsteadiness on feet (R26.81);Muscle weakness (generalized) (M62.81);Difficulty in walking, not elsewhere classified (R26.2)     Time: 3762-8315 PT Time Calculation (min) (ACUTE ONLY): 15 min  Charges:  $Gait Training: 8-22 mins                     West Carbo, PT, DPT   Acute Rehabilitation Department Pager #: [phone]   Sandra Cockayne 03/20/2021, 12:27 PM

## 2021-03-20 NOTE — Progress Notes (Addendum)
      [address]       [place] [address]             [phone]       11 Days Post-Op Procedure(s) (LRB): XI ROBOTIC ASSISTED THORASCOPY-LEFT UPPER LOBE SEGMENTECTOMY (Left) INTERCOSTAL NERVE BLOCK (Left) LYMPH NODE DISSECTION (Left) THORACOTOMY MAJOR (Left)  Subjective: Sitting up in bed trying to eat breakfast, says her appetite is poor and she does not like the food.  She says she remains very weak and is not getting out of bed much.  She is on room air and says she has been having bowel movements.  Objective: Vital signs in last 24 hours: Temp:  [97.7 F (36.5 C)-98.8 F (37.1 C)] 97.7 F (36.5 C) (10/24 0735) Pulse Rate:  [79-96] 83 (10/24 0735) Cardiac Rhythm: Normal sinus rhythm (10/24 0458) Resp:  [18-22] 20 (10/24 0735) BP: (95-141)/(42-60) 137/60 (10/24 0735) SpO2:  [95 %-100 %] 96 % (10/24 0735)     Intake/Output from previous day: 10/23 0701 - 10/24 0700 In: 240 [P.O.:240] Out: 1400 [Urine:1400]   Physical Exam:  Cardiovascular: RRR Pulmonary: Slightly diminished left base.  Chest x-ray showing left lower lung zone volume loss/effusion. Abdomen: Soft, non tender. Extremities: No calf tenderness.  Wounds: Clean and dry.  Left side/flank and left arm with expected bruising.  No erythema or signs of infection.   Lab Results: CBC: Recent Labs    03/18/21 0500 03/20/21 0510  WBC 17.0* 13.3*  HGB 9.1* 9.1*  HCT 28.0* 29.4*  PLT 257 299    BMET:  Recent Labs    03/18/21 0500  NA 134*  K 3.5  CL 95*  CO2 29  GLUCOSE 195*  BUN 18  CREATININE 0.85  CALCIUM 8.2*      Assessment/Plan:  1. CV - Pevious a fib with RVR. Continues to maintain SR on Amiodarone 200 mg bid and Lopressor 50 mg bid.  BP and heart rate stable. 2.  Pulmonary - On room air. CXR this am shows small to moderate left pleural effusion.  Encourage incentive spirometer. Final pathology metastatic endometrial cancer. She will follow up with Dr. Denman George  from oncology 3. DM-CBGs 130-180's. Pre op HGA1C 6.9. On Jardiance and SSI. 4. Expected post op blood loss anemia-H and H this am stable at 9.1 and 29.4. 5. History of pancreatic insufficiency-on Creon 6. Hyponatremia-sodium improved to 134 2 days ago, no BMP today. 7. Leukocytosis-Last WBC 13,000 and trending down.   8. Deconditioned-continue PT and planning further rehab in SNF when available.    Joline Maxcy [phone] 03/20/2021,7:35 AM  Patient seen and examined, agree with above CXR stable postop changes Still has a ways to go with deconditioning Medically ready for SNF once bed available  Remo Lipps C. Roxan Hockey, MD [facility] [phone]

## 2021-03-21 ENCOUNTER — Ambulatory Visit: Payer: PPO | Admitting: Thoracic Surgery (Cardiothoracic Vascular Surgery)

## 2021-03-21 LAB — GLUCOSE, CAPILLARY
Glucose-Capillary: 149 mg/dL — ABNORMAL HIGH (ref 70–99)
Glucose-Capillary: 153 mg/dL — ABNORMAL HIGH (ref 70–99)
Glucose-Capillary: 220 mg/dL — ABNORMAL HIGH (ref 70–99)
Glucose-Capillary: 153 mg/dL — ABNORMAL HIGH (ref 70–99)
Glucose-Capillary: 175 mg/dL — ABNORMAL HIGH (ref 70–99)

## 2021-03-21 LAB — SARS CORONAVIRUS 2 (TAT 6-24 HRS): SARS Coronavirus 2: NEGATIVE

## 2021-03-21 NOTE — TOC Progression Note (Addendum)
Transition of Care [facility]) - Progression Note    Patient Details  Name: NATALY PACIFICO MRN: [id number] Date of Birth: 1940/06/12  [facility]) CM/SW Contact  Reece Agar, [place] Phone Number: 03/21/2021, 4:22 PM  Clinical Narrative:    CSW following, insurance pending updated OT note.   Plan to Discharge: Inpatient Rehab  Barriers to Discharge: Continued Medical Work up  Expected Discharge Plan and Services Expected Discharge Plan: [place] In-house Referral: Clinical Social Work   Post Acute Care Choice: Grants arrangements for the past 2 months: Single Family Home                                       Social Determinants of Health (SDOH) Interventions    Readmission Risk Interventions No flowsheet data found.

## 2021-03-21 NOTE — Evaluation (Signed)
Occupational Therapy Evaluation Patient Details Name: [name] MRN: [id number] DOB: [birth year] Today's Date: 03/21/2021   History of Present Illness Pt is a 80 y.o. female admitted 03/09/21 for XI robotic-assisted thorascopy and LUL segmentectomy, L major thoracotomy. Chest tube removed 10/20; CXR shows increase L pleural effusion, increase in atelectasis. PMH includes osteopenia, DVT, GIB, whipple.   Clinical Impression   [name] was evaluated s/p the above admission, she was indep PTA. Pt lives alone in a 1 level home, 5 STE with support available PRN. Pt is currently limited by generalized weakness, poor activity tolerance, and pain. Upon evaluation pt required min guard - min A for all mobility with RW, and up to min A for ADLs. Pt would benefit from continued OT acutely to progress towards her indep baseline. Pt is extremely motivated and is an excellent CIR candidate at d/c.      Recommendations for follow up therapy are one component of a multi-disciplinary discharge planning process, led by the attending physician.  Recommendations may be updated based on patient status, additional functional criteria and insurance authorization.   Follow Up Recommendations  Acute inpatient rehab (3hours/day)    Assistance Recommended at Discharge Frequent or constant Supervision/Assistance  Functional Status Assessment  Patient has had a recent decline in their functional status and demonstrates the ability to make significant improvements in function in a reasonable and predictable amount of time.   Equipment Recommendations  Other (comment) (defer)       Precautions / Restrictions Precautions Precautions: Fall;Other (comment) Precaution Comments: Watch vitals - does not wear home O2 Restrictions Weight Bearing Restrictions: No      Mobility Bed Mobility Overal bed mobility: Needs Assistance Bed Mobility: Supine to Sit;Rolling Rolling: Min guard   Supine to sit: Min  assist     General bed mobility comments: min A for trunk elevation and to progress hips toward EOB    Transfers Overall transfer level: Needs assistance Equipment used: Rolling walker (2 wheels) Transfers: Sit to/from Stand Sit to Stand: Min guard           General transfer comment: close min guard - unsteady upon standing      Balance Overall balance assessment: Needs assistance Sitting-balance support: No upper extremity supported;Feet supported Sitting balance-Leahy Scale: Fair     Standing balance support: Bilateral upper extremity supported Standing balance-Leahy Scale: Poor                             ADL either performed or assessed with clinical judgement   ADL Overall ADL's : Needs assistance/impaired Eating/Feeding: Independent;Sitting   Grooming: Min guard;Standing   Upper Body Bathing: Minimal assistance;Sitting   Lower Body Bathing: Minimal assistance;Sit to/from stand   Upper Body Dressing : Set up;Sitting   Lower Body Dressing: Minimal assistance;Sit to/from stand   Toilet Transfer: Agricultural engineer (2 wheels)   Toileting- Clothing Manipulation and Hygiene: Supervision/safety;Sitting/lateral lean       Functional mobility during ADLs: Min guard;Rolling walker (2 wheels) General ADL Comments: limited by activity tolerance, generalized weakness, sx site soreness with functional tasks      Pertinent Vitals/Pain Pain Assessment: Faces Faces Pain Scale: Hurts a little bit Pain Location: L-side back at incision Pain Descriptors / Indicators: Grimacing;Guarding Pain Intervention(s): Monitored during session;Limited activity within patient's tolerance;Repositioned     Hand Dominance Right   Extremity/Trunk Assessment Upper Extremity Assessment Upper Extremity Assessment: Generalized weakness (globally 3/5 MMT)   Lower  Extremity Assessment Lower Extremity Assessment: Defer to PT evaluation;Generalized weakness    Cervical / Trunk Assessment Cervical / Trunk Assessment: Other exceptions   Communication Communication Communication: No difficulties   Cognition Arousal/Alertness: Awake/alert Behavior During Therapy: Anxious;WFL for tasks assessed/performed Overall Cognitive Status: No family/caregiver present to determine baseline cognitive functioning                                       General Comments  VSS on RA    Exercises     Shoulder Instructions      Home Living Family/patient expects to be discharged to:: Private residence Living Arrangements: Alone Available Help at Discharge: Family;Available PRN/intermittently Type of Home: House Home Access: Stairs to enter [employer] of Steps: 5 Entrance Stairs-Rails: Right;Left Home Layout: One level     Bathroom Shower/Tub: Chief Strategy Officer: None          Prior Functioning/Environment Prior Level of Function : Independent/Modified Independent               ADLs Comments: drives, has Location manager man" for outside maintance        OT Problem List: Decreased strength;Decreased range of motion;Decreased activity tolerance;Impaired balance (sitting and/or standing);Decreased safety awareness;Decreased knowledge of use of DME or AE;Decreased knowledge of precautions;Cardiopulmonary status limiting activity;Pain      OT Treatment/Interventions: Self-care/ADL training;Therapeutic exercise;DME and/or AE instruction;Therapeutic activities;Patient/family education;Balance training    OT Goals(Current goals can be found in the care plan section) Acute Rehab OT Goals Patient Stated Goal: to get stronger and go home OT Goal Formulation: With patient Time For Goal Achievement: 04/04/21 Potential to Achieve Goals: Fair  OT Frequency: Min 2X/week    AM-PAC OT "6 Clicks" Daily Activity     Outcome Measure Help from another person eating meals?: None Help from another person taking  care of personal grooming?: A Little Help from another person toileting, which includes using toliet, bedpan, or urinal?: A Little Help from another person bathing (including washing, rinsing, drying)?: A Little Help from another person to put on and taking off regular upper body clothing?: None Help from another person to put on and taking off regular lower body clothing?: A Little 6 Click Score: 20   End of Session Equipment Utilized During Treatment: Gait belt;Rolling walker (2 wheels) Nurse Communication: Mobility status  Activity Tolerance: Patient tolerated treatment well;Patient limited by fatigue Patient left: in bed;with call bell/phone within reach;with bed alarm set  OT Visit Diagnosis: Unsteadiness on feet (R26.81);Other abnormalities of gait and mobility (R26.89);Pain                Time: 1720-1740 OT Time Calculation (min): 20 min Charges:  OT General Charges $OT Visit: 1 Visit OT Evaluation $OT Eval Moderate Complexity: 1 Mod   Annielee Jemmott A Zarif Rathje 03/21/2021, 5:43 PM

## 2021-03-21 NOTE — Progress Notes (Signed)
Mobility Specialist Progress Note:   03/21/21 0950  Therapy Vitals  Pulse Rate 87  Mobility  Activity Ambulated in room  Level of Assistance Standby assist, set-up cues, supervision of patient - no hands on  Assistive Device Front wheel walker  Distance Ambulated (ft) 14 ft  Mobility Ambulated with assistance in room;Sit up in bed/chair position for meals  Mobility Response Tolerated well  Mobility performed by Mobility specialist  Bed Position Chair  $Mobility charge 1 Mobility   Pre Mobility: HR 87 bpm Post Mobility: HR 90 bpm  Pt in chair, will f/u to get back to bed.   Nelta Numbers Mobility Specialist  Phone [phone]

## 2021-03-21 NOTE — Progress Notes (Addendum)
      [address]       [place] [address]             [phone]       12 Days Post-Op Procedure(s) (LRB): XI ROBOTIC ASSISTED THORASCOPY-LEFT UPPER LOBE SEGMENTECTOMY (Left) INTERCOSTAL NERVE BLOCK (Left) LYMPH NODE DISSECTION (Left) THORACOTOMY MAJOR (Left)  Subjective: Awake and alert, eating breakfast.  Says she had a much better day yesterday and slept well last night.  Walked about 10 feet with assistance yesterday.  Remains on room air with adequate O2 sats.  Objective: Vital signs in last 24 hours: Temp:  [97.7 F (36.5 C)-98.6 F (37 C)] 98.4 F (36.9 C) (10/25 0427) Pulse Rate:  [78-87] 78 (10/25 0427) Cardiac Rhythm: Normal sinus rhythm (10/25 0700) Resp:  [16-20] 16 (10/25 0427) BP: (127-152)/(52-61) 127/52 (10/25 0427) SpO2:  [94 %-97 %] 95 % (10/25 0427) Weight:  [58.6 kg] 58.6 kg (10/25 0427)     Intake/Output from previous day: 10/24 0701 - 10/25 0700 In: 0  Out: 1950 [Urine:1950]   Physical Exam:  Cardiovascular: RRR Pulmonary: Breath sounds are clear  Abdomen: Soft, non tender. Extremities: No calf tenderness, no significant edema.  Wounds: Clean and dry.  Left side/flank and left arm with expected bruising.  No erythema or signs of infection.   Lab Results: CBC: Recent Labs    03/20/21 0510  WBC 13.3*  HGB 9.1*  HCT 29.4*  PLT 299    BMET:  No results for input(s): NA, K, CL, CO2, GLUCOSE, BUN, CREATININE, CALCIUM in the last 72 hours.    Assessment/Plan:  1. CV - Pevious a fib with RVR. Continues to maintain SR on Amiodarone 200 mg bid and Lopressor 50 mg bid.  BP and heart rate stable. 2.  Pulmonary - On room air and does not appear dyspneic.  Final pathology metastatic endometrial cancer. She will follow up with Dr. Denman George from oncology 3. DM-adequate control.  On Jardiance and SSI. 4. Expected post op blood loss anemia-H and H stable with no evidence of ongoing losses  5. History of pancreatic  insufficiency-on Creon 6. Hyponatremia-sodium trending up 7. Leukocytosis-resolving 8. Deconditioned-mobility and strength gradually improving.  PT recommending inpatient rehab.  Awaiting insurance approval for [facility] inpatient rehab unit.  [name] [phone] 03/21/2021,7:24 AM  Patient seen and examined, agree with above She looks great today, best she has looked since surgery  Revonda Standard. Roxan Hockey, MD [facility] [phone]

## 2021-03-22 LAB — GLUCOSE, CAPILLARY
Glucose-Capillary: 165 mg/dL — ABNORMAL HIGH (ref 70–99)
Glucose-Capillary: 193 mg/dL — ABNORMAL HIGH (ref 70–99)
Glucose-Capillary: 179 mg/dL — ABNORMAL HIGH (ref 70–99)
Glucose-Capillary: 186 mg/dL — ABNORMAL HIGH (ref 70–99)

## 2021-03-22 NOTE — Progress Notes (Signed)
Physical Therapy Treatment Patient Details Name: [name] MRN: [id number] DOB: December 11, 1940 Today's Date: 03/22/2021   History of Present Illness Pt is a 80 y.o. female admitted 03/09/21 with L lung nodule for XI robotic-assisted thorascopy and LUL segmentectomy, L major thoracotomy. Chest tube removed 10/20; CXR shows increase L pleural effusion, increase in atelectasis. PMH includes osteopenia, DVT, GIB, whipple.    PT Comments    Pt making gradual progress.  She increased gait and participated with seated exercises with rest breaks.  She scored 17 on AM-PAC but continue to recommend inpatient rehab because lives alone, not ambulating household distances, fatigues easily, and unsteady.      Recommendations for follow up therapy are one component of a multi-disciplinary discharge planning process, led by the attending physician.  Recommendations may be updated based on patient status, additional functional criteria and insurance authorization.  Follow Up Recommendations  Acute inpatient rehab (3hours/day)     Assistance Recommended at Discharge Frequent or constant Supervision/Assistance  Equipment Recommendations  Rolling walker (2 wheels)    Recommendations for Other Services       Precautions / Restrictions Precautions Precautions: Fall     Mobility  Bed Mobility Overal bed mobility: Needs Assistance Bed Mobility: Rolling;Supine to Sit Rolling: Min guard   Supine to sit: Min assist     General bed mobility comments: min A for trunk elevation and to progress hips toward EOB - cues to push more with R UE than L UE for pain control    Transfers Overall transfer level: Needs assistance Equipment used: Rolling walker (2 wheels) Transfers: Sit to/from Stand Sit to Stand: Min guard           General transfer comment: close min guard - unsteady upon standing; performed x 2    Ambulation/Gait Ambulation/Gait assistance: Min assist Gait Distance (Feet):  48 Feet Assistive device: Rolling walker (2 wheels) Gait Pattern/deviations: Step-to pattern;Decreased stride length Gait velocity: Decreased   General Gait Details: Mild unsteadiness requiring RW and min A; cues for foot clearance and RW use; fatigued easily   Chief Strategy Officer    Modified Rankin (Stroke Patients Only)       Balance Overall balance assessment: Needs assistance Sitting-balance support: No upper extremity supported;Feet supported Sitting balance-Leahy Scale: Good     Standing balance support: Bilateral upper extremity supported Standing balance-Leahy Scale: Poor Standing balance comment: Reliant on UE support, min guard                            Cognition Arousal/Alertness: Awake/alert Behavior During Therapy: WFL for tasks assessed/performed Overall Cognitive Status: Within Functional Limits for tasks assessed                                          Exercises General Exercises - Lower Extremity Ankle Circles/Pumps: AROM;Both;10 reps;Seated Long Arc Quad: AROM;Both;10 reps;Seated Hip Flexion/Marching: AROM;Both;10 reps;Seated Other Exercises Other Exercises: Hip add pillow squeeze 3 sec hold x 10 seated    General Comments General comments (skin integrity, edema, etc.): VSS on RA      Pertinent Vitals/Pain Pain Assessment: No/denies pain    Home Living  Prior Function            PT Goals (current goals can now be found in the care plan section) Progress towards PT goals: Progressing toward goals    Frequency    Min 3X/week      PT Plan Current plan remains appropriate    Co-evaluation              AM-PAC PT "6 Clicks" Mobility   Outcome Measure  Help needed turning from your back to your side while in a flat bed without using bedrails?: A Little Help needed moving from lying on your back to sitting on the side of a flat bed  without using bedrails?: A Little Help needed moving to and from a bed to a chair (including a wheelchair)?: A Little Help needed standing up from a chair using your arms (e.g., wheelchair or bedside chair)?: A Little Help needed to walk in hospital room?: A Little Help needed climbing 3-5 steps with a railing? : A Lot 6 Click Score: 17    End of Session Equipment Utilized During Treatment: Gait belt Activity Tolerance: Patient tolerated treatment well Patient left: in bed;with call bell/phone within reach Nurse Communication: Mobility status PT Visit Diagnosis: Unsteadiness on feet (R26.81);Muscle weakness (generalized) (M62.81);Difficulty in walking, not elsewhere classified (R26.2)     Time: 1211-1228 PT Time Calculation (min) (ACUTE ONLY): 17 min  Charges:  $Gait Training: 8-22 mins                     Abran Richard, PT Acute Rehab Services Pager [phone] [facility] 03/22/2021, 12:48 PM

## 2021-03-22 NOTE — Discharge Instructions (Addendum)
Discharge Instructions:  1. You may shower, please wash incisions daily with soap and water and keep dry.  If you wish to cover wounds with dressing you may do so but please keep clean and change daily.  No tub baths or swimming until incisions have completely healed.  If your incisions become red or develop any drainage please call our office at [phone]  2. No Driving until cleared by Dr. Leonarda Salon office and you are no longer using narcotic pain medications  3. Monitor your weight daily.. Please use the same scale and weigh at same time... If you gain 5-10 lbs in 48 hours with associated lower extremity swelling, please contact our office at [phone]  4. Fever of 101.5 for at least 24 hours with no source, please contact our office at [phone]  5. Activity- up as tolerated, please walk at least 3 times per day.  Avoid strenuous activity, no lifting, pushing, or pulling with your arms over 8-10 lbs for a minimum of 6 weeks  6. If any questions or concerns arise, please do not hesitate to contact our office at [phone]]   ===============================================================================================  Information on my medicine - ELIQUIS (apixaban)  This medication education was reviewed with me or my healthcare representative as part of my discharge preparation.   Why was Eliquis prescribed for you? Eliquis was prescribed for you to reduce the risk of a blood clot forming that can cause a stroke if you have a medical condition called atrial fibrillation (a type of irregular heartbeat).  What do You need to know about Eliquis ? Take your Eliquis TWICE DAILY - one tablet in the morning and one tablet in the evening with or without food. If you have difficulty swallowing the tablet whole please discuss with your pharmacist how to take the medication safely.  Take Eliquis exactly as prescribed by your doctor and DO NOT stop taking Eliquis without  talking to the doctor who prescribed the medication.  Stopping may increase your risk of developing a stroke.  Refill your prescription before you run out.  After discharge, you should have regular check-up appointments with your healthcare provider that is prescribing your Eliquis.  In the future your dose may need to be changed if your kidney function or weight changes by a significant amount or as you get older.  What do you do if you miss a dose? If you miss a dose, take it as soon as you remember on the same day and resume taking twice daily.  Do not take more than one dose of ELIQUIS at the same time to make up a missed dose.  Important Safety Information A possible side effect of Eliquis is bleeding. You should call your healthcare provider right away if you experience any of the following: Bleeding from an injury or your nose that does not stop. Unusual colored urine (red or dark brown) or unusual colored stools (red or black). Unusual bruising for unknown reasons. A serious fall or if you hit your head (even if there is no bleeding).  Some medicines may interact with Eliquis and might increase your risk of bleeding or clotting while on Eliquis. To help avoid this, consult your healthcare provider or pharmacist prior to using any new prescription or non-prescription medications, including herbals, vitamins, non-steroidal anti-inflammatory drugs (NSAIDs) and supplements.  This website has more information on Eliquis (apixaban): http://www.eliquis.com/eliquis/home

## 2021-03-22 NOTE — Progress Notes (Addendum)
      [address]       [place], [address]             [phone]       13 Days Post-Op Procedure(s) (LRB): XI ROBOTIC ASSISTED THORASCOPY-LEFT UPPER LOBE SEGMENTECTOMY (Left) INTERCOSTAL NERVE BLOCK (Left) LYMPH NODE DISSECTION (Left) THORACOTOMY MAJOR (Left)  Subjective: Awake and alert, feels well, no new concerns.  Walking short distances with assistance.   Remains on room air with adequate O2 sats.  Objective: Vital signs in last 24 hours: Temp:  [97.7 F (36.5 C)-98.8 F (37.1 C)] 98.8 F (37.1 C) (10/26 0400) Pulse Rate:  [56-87] 64 (10/26 0400) Cardiac Rhythm: Normal sinus rhythm (10/25 1912) Resp:  [18-20] 18 (10/26 0400) BP: (117-128)/(47-54) 128/51 (10/26 0400) SpO2:  [94 %-95 %] 94 % (10/26 0400) Weight:  [56.6 kg] 56.6 kg (10/26 0415)     Intake/Output from previous day: 10/25 0701 - 10/26 0700 In: 630 [P.O.:630] Out: 500 [Urine:500]   Physical Exam:  Cardiovascular: RRR Pulmonary: Breath sounds are clear  Abdomen: Soft, non tender. Extremities: No calf tenderness, no edema.  Wounds: Clean and dry.  Left side/flank and left arm with expected bruising.  No erythema or signs of infection.   Lab Results: CBC: Recent Labs    03/20/21 0510  WBC 13.3*  HGB 9.1*  HCT 29.4*  PLT 299    BMET:  No results for input(s): NA, K, CL, CO2, GLUCOSE, BUN, CREATININE, CALCIUM in the last 72 hours.    Assessment/Plan:  1. CV - Pevious a fib with RVR. Continues to maintain SR on Amiodarone 200 mg bid and Lopressor 50 mg bid.  BP and heart rate stable. 2.  Pulmonary - On room air and does not appear dyspneic.  Final pathology metastatic endometrial cancer. She will follow up with Dr. Denman George from oncology 3. DM-glucose 150-220.  On Jardiance and SSI. 4. Expected post op blood loss anemia-H and H stable with no evidence of ongoing losses  5. History of pancreatic insufficiency-on Creon 6. Hyponatremia-sodium trending up, no new lab  today 7. Leukocytosis-resolving 8. Deconditioned-mobility and strength gradually improving.  PT and OT recommending inpatient rehab.  Awaiting insurance approval for [facility] inpatient rehab unit per family's request.  Joline Maxcy [phone] 03/22/2021,7:17 AM  Patient seen and examined, agree with above Medically ready for rehab  Remo Lipps C. Roxan Hockey, MD [facility] [phone]

## 2021-03-23 LAB — GLUCOSE, CAPILLARY
Glucose-Capillary: 154 mg/dL — ABNORMAL HIGH (ref 70–99)
Glucose-Capillary: 187 mg/dL — ABNORMAL HIGH (ref 70–99)
Glucose-Capillary: 195 mg/dL — ABNORMAL HIGH (ref 70–99)
Glucose-Capillary: 204 mg/dL — ABNORMAL HIGH (ref 70–99)

## 2021-03-23 NOTE — TOC Progression Note (Signed)
Transition of Care [facility]) - Progression Note    Patient Details  Name: [name] MRN: [id number] Date of Birth: [birth year]  Transition of Care [facility]) CM/SW Contact  Reece Agar, [place] Phone Number: 03/23/2021, 9:33 AM  Clinical Narrative:    CSW contacted Janett Billow at [facility] inpatient rehab [facility]) to follow up on auth status. Janett Billow states that Josem Kaufmann should be back no later than Friday 10/28, Janett Billow will follow up with CSW with decision.    Expected Discharge Plan: [place] Barriers to Discharge: Continued Medical Work up  Expected Discharge Plan and Services Expected Discharge Plan: [place] In-house Referral: Clinical Social Work   Post Acute Care Choice: [place] Living arrangements for the past 2 months: Single Family Home                                       Social Determinants of Health (SDOH) Interventions    Readmission Risk Interventions No flowsheet data found.

## 2021-03-23 NOTE — Progress Notes (Addendum)
      [address]       [place] [address]             [phone]       14 Days Post-Op Procedure(s) (LRB): XI ROBOTIC ASSISTED THORASCOPY-LEFT UPPER LOBE SEGMENTECTOMY (Left) INTERCOSTAL NERVE BLOCK (Left) LYMPH NODE DISSECTION (Left) THORACOTOMY MAJOR (Left)  Subjective: Awake and alert, feels well, no new concerns.  Walked 48 feet with PT yesterday.   Remains on room air with adequate O2 sats.  Objective: Vital signs in last 24 hours: Temp:  [97.6 F (36.4 C)-99 F (37.2 C)] 97.8 F (36.6 C) (10/27 0739) Pulse Rate:  [60-83] 75 (10/27 0739) Cardiac Rhythm: Normal sinus rhythm (10/26 1933) Resp:  [19-24] 20 (10/27 0739) BP: (109-132)/(45-55) 120/55 (10/27 0739) SpO2:  [92 %-96 %] 96 % (10/27 0739) Weight:  [55.7 kg] 55.7 kg (10/27 0630)     Intake/Output from previous day: 10/26 0701 - 10/27 0700 In: -  Out: 2200 [Urine:2200]   Physical Exam:  Cardiovascular: RRR Pulmonary: Breath sounds are clear  Abdomen: Soft, non tender. Extremities: No calf tenderness, no edema.  Wounds: Clean and dry.  Left side/flank and left arm with expected bruising.  No erythema or signs of infection.   Lab Results: CBC: No results for input(s): WBC, HGB, HCT, PLT in the last 72 hours.  BMET:  No results for input(s): NA, K, CL, CO2, GLUCOSE, BUN, CREATININE, CALCIUM in the last 72 hours.    Assessment/Plan:  1. CV - Pevious a fib with RVR. Continues to maintain SR on Amiodarone 200 mg bid and Lopressor 50 mg bid.  BP and heart rate stable 2.  Pulmonary - On room air and does not appear dyspneic.   Path: metastatic endometrial cancer. She will follow up with Dr. Denman George from oncology 3. DM-glucose 150-220.  On Jardiance and SSI. 4. Expected post op blood loss anemia-H and H stable with no evidence of ongoing losses  5. History of pancreatic insufficiency-on Creon 6. Hyponatremia-sodium trending up, checking BMP 7. Leukocytosis-resolving 8.  Deconditioned-mobility and strength gradually improving.  PT and OT recommending inpatient rehab and she is medically stable for transition to rehab.  Awaiting insurance approval for [facility] inpatient rehab unit per family's request.  Joline Maxcy [phone] 03/23/2021,7:52 AM  Patient seen and examined, agree with above Continue PT/OT Awaiting rehab approval  Revonda Standard. Roxan Hockey, MD [facility] [phone]

## 2021-03-24 ENCOUNTER — Inpatient Hospital Stay (HOSPITAL_COMMUNITY): Payer: PPO

## 2021-03-24 LAB — BASIC METABOLIC PANEL
Anion gap: 7 (ref 5–15)
BUN: 18 mg/dL (ref 8–23)
CO2: 27 mmol/L (ref 22–32)
Calcium: 8.4 mg/dL — ABNORMAL LOW (ref 8.9–10.3)
Chloride: 100 mmol/L (ref 98–111)
Creatinine, Ser: 0.86 mg/dL (ref 0.44–1.00)
GFR, Estimated: >60 mL/min (ref >60)
Glucose, Bld: 232 mg/dL — ABNORMAL HIGH (ref 70–99)
Potassium: 3.8 mmol/L (ref 3.5–5.1)
Sodium: 134 mmol/L — ABNORMAL LOW (ref 135–145)

## 2021-03-24 LAB — GLUCOSE, CAPILLARY
Glucose-Capillary: 211 mg/dL — ABNORMAL HIGH (ref 70–99)
Glucose-Capillary: 186 mg/dL — ABNORMAL HIGH (ref 70–99)
Glucose-Capillary: 190 mg/dL — ABNORMAL HIGH (ref 70–99)
Glucose-Capillary: 144 mg/dL — ABNORMAL HIGH (ref 70–99)

## 2021-03-24 MED ORDER — TRAMADOL HCL 50 MG PO TABS: 50.0000 mg | ORAL_TABLET | Freq: Four times a day (QID) | ORAL | 0 refills | Status: DC | PRN

## 2021-03-24 MED ORDER — AMIODARONE HCL 200 MG PO TABS: 200.0000 mg | ORAL_TABLET | Freq: Every day | ORAL | Status: DC

## 2021-03-24 MED ORDER — APIXABAN 2.5 MG PO TABS
2.5000 mg | ORAL_TABLET | Freq: Two times a day (BID) | ORAL | Status: DC
  Administered 2021-03-24 – 2021-03-29 (×10): 2.5 mg via ORAL
  Filled 2021-03-24 (×10): qty 1

## 2021-03-24 NOTE — TOC Progression Note (Signed)
Transition of Care [facility]) - Progression Note    Patient Details  Name: RAINELLE SULEWSKI MRN: [id number] Date of Birth: Jan 05, 1941  Transition of Care [facility]) CM/SW Contact  Emeterio Reeve, [place] Phone Number: 03/24/2021, 12:53 PM  Clinical Narrative:     Pts insurance Josem Kaufmann is still pending.  Expected Discharge Plan: [place] Barriers to Discharge: Continued Medical Work up  Expected Discharge Plan and Services Expected Discharge Plan: [place] In-house Referral: Clinical Social Work   Post Acute Care Choice: [place] Living arrangements for the past 2 months: Single Family Home                                       Social Determinants of Health (SDOH) Interventions    Readmission Risk Interventions No flowsheet data found.  Emeterio Reeve, LCSW Clinical Social Worker

## 2021-03-24 NOTE — Progress Notes (Addendum)
[address]       [place] [address]             [phone]       15 Days Post-Op Procedure(s) (LRB): XI ROBOTIC ASSISTED THORASCOPY-LEFT UPPER LOBE SEGMENTECTOMY (Left) INTERCOSTAL NERVE BLOCK (Left) LYMPH NODE DISSECTION (Left) THORACOTOMY MAJOR (Left)  Subjective: Awake and alert, feels well, no new concerns.  Progressing slowly with mobility but still walking less than 100 feet.  Remains on room air with adequate O2 sats.  Objective: Vital signs in last 24 hours: Temp:  [97.6 F (36.4 C)-98.9 F (37.2 C)] 98.5 F (36.9 C) (10/28 0727) Pulse Rate:  [59-72] 60 (10/28 0324) Cardiac Rhythm: Normal sinus rhythm (10/28 0727) Resp:  [18-20] 20 (10/28 0324) BP: (112-132)/(41-49) 118/41 (10/28 0324) SpO2:  [90 %-96 %] 90 % (10/28 0727) Weight:  [55.6 kg] 55.6 kg (10/28 0644)     Intake/Output from previous day: 10/27 0701 - 10/28 0700 In: 240 [P.O.:240] Out: 1200 [Urine:1200]   Physical Exam:  Cardiovascular: RRR Pulmonary: Breath sounds are clear  Abdomen: Soft, non tender. Extremities: No calf tenderness, no edema.  Wounds: Clean and dry.  Left side/flank and left arm with expected bruising.  No erythema or signs of infection.   Lab Results: CBC: No results for input(s): WBC, HGB, HCT, PLT in the last 72 hours.  BMET:  Recent Labs    03/24/21 0043  NA 134*  K 3.8  CL 100  CO2 27  GLUCOSE 232*  BUN 18  CREATININE 0.86  CALCIUM 8.4*   EXAM: CHEST - 2 VIEW   COMPARISON:  Previous studies including the examination of 03/20/2021   FINDINGS: Transverse diameter of heart is increased. Right lung is clear. There is increased density in left lower lung fields which may suggest pleural effusion and possibly underlying infiltrate/atelectasis. Surgical staples are seen in the left upper lung field. There is small residual left apical pneumothorax with no significant change. There is interval removal of right jugular central  venous catheter.   IMPRESSION: Postsurgical changes are noted in left lung. Small residual left apical pneumothorax has not changed significantly. Increased density in left lower lung field and left apical region may suggest pleural effusion and possibly underlying infiltrates with no significant interval change.   Reading location: [place], [place].     Electronically Signed   By: Elmer Picker M.D.   On: 03/24/2021 08:13   Assessment/Plan:  1. CV - a fib with RVR early post-op. Continues to maintain SR on Amiodarone 200 mg bid and Lopressor 50 mg bid.  BP and heart rate stable 2.  Pulmonary - On room air and does not appear dyspneic.   Path: metastatic endometrial cancer. She will follow up with Dr. Denman George from oncology.  Chest x-ray reviewed stable postsurgical changes with left lower lung zone volume loss.  Stable left apical pneumothorax. 3. DM-glucose control adequate.  On Jardiance and SSI. 4. Expected post op blood loss anemia-H and H stable with no evidence of ongoing losses  5. History of pancreatic insufficiency-on Creon 6. Hyponatremia-sodium trending up as oral intake has improved 7. Leukocytosis-resolving 8. Deconditioned-mobility and strength gradually improving.  PT and OT recommending inpatient rehab and she is medically stable for transition to rehab.  Awaiting insurance approval for [facility] inpatient rehab unit per family's request.  The care management team is expecting notification by the insurance company today.  Joline Maxcy [phone] 03/24/2021,8:29 AM  Patient seen and  examined, agree with above Still waiting on insurance for rehab approval Is making steady progress Her daughter reminded me she had a blood clot after surgery previously. I think she is far enough out to be safe from a bleeding standpoint. Will start low dose Eliquis, plan to treat for about a month  Remo Lipps C. Roxan Hockey, MD [facility] [phone]

## 2021-03-24 NOTE — Progress Notes (Signed)
Occupational Therapy Treatment Patient Details Name: [name] MRN: [id number] DOB: 04-11-1941 Today's Date: 03/24/2021   History of present illness Pt is a 80 y.o. female admitted 03/09/21 with L lung nodule for XI robotic-assisted thorascopy and LUL segmentectomy, L major thoracotomy. Chest tube removed 10/20; CXR shows increase L pleural effusion, increase in atelectasis. PMH includes osteopenia, DVT, GIB, whipple.   OT comments  Pt requesting to urinate prior to ambulating to sink for grooming to minimize risk of incontinence. Pt requiring min to min guard assist and RW. Cued for pursed lip breathing and pacing during exertion. Pt limited by decreased activity tolerance. VSS on RA. Pt has excellent potential to return home modified independently with intensive rehab.    Recommendations for follow up therapy are one component of a multi-disciplinary discharge planning process, led by the attending physician.  Recommendations may be updated based on patient status, additional functional criteria and insurance authorization.    Follow Up Recommendations  Acute inpatient rehab (3hours/day)    Assistance Recommended at Discharge Frequent or constant Supervision/Assistance  Equipment Recommendations  Other (comment) (defer to next venue)    Recommendations for Other Services      Precautions / Restrictions Precautions Precautions: Fall Precaution Comments: incontinent       Mobility Bed Mobility Overal bed mobility: Modified Independent             General bed mobility comments: no assist or difficulty    Transfers Overall transfer level: Needs assistance Equipment used: Rolling walker (2 wheels);None Transfers: Sit to/from Stand Sit to Stand: Min guard                 Balance Overall balance assessment: Needs assistance   Sitting balance-Leahy Scale: Good     Standing balance support: Bilateral upper extremity supported Standing balance-Leahy  Scale: Fair Standing balance comment: fair statically                           ADL either performed or assessed with clinical judgement   ADL Overall ADL's : Needs assistance/impaired Eating/Feeding: Set up;Bed level   Grooming: Min guard;Wash/dry hands;Wash/dry face;Oral care;Standing                   Toilet Transfer: Minimal assistance;Rolling walker (2 wheels);Ambulation   Toileting- Clothing Manipulation and Hygiene: Min guard;Sit to/from stand       Functional mobility during ADLs: Rolling walker (2 wheels);Min guard General ADL Comments: educated in pursed lip breathing and pacing     Vision       Perception     Praxis      Cognition Arousal/Alertness: Awake/alert Behavior During Therapy: WFL for tasks assessed/performed Overall Cognitive Status: Within Functional Limits for tasks assessed                                 General Comments: for ADL addressed          Exercises     Shoulder Instructions       General Comments      Pertinent Vitals/ Pain       Pain Assessment: No/denies pain  Home Living                                          Prior  Functioning/Environment              Frequency  Min 2X/week        Progress Toward Goals  OT Goals(current goals can now be found in the care plan section)  Progress towards OT goals: Progressing toward goals  Acute Rehab OT Goals Patient Stated Goal: go to rehab and regain endurance OT Goal Formulation: With patient Time For Goal Achievement: 04/04/21 Potential to Achieve Goals: [place] Discharge plan remains appropriate    Co-evaluation                 AM-PAC OT "6 Clicks" Daily Activity     Outcome Measure   Help from another person eating meals?: None Help from another person taking care of personal grooming?: A Little Help from another person toileting, which includes using toliet, bedpan, or urinal?: A Little Help  from another person bathing (including washing, rinsing, drying)?: A Little Help from another person to put on and taking off regular upper body clothing?: None Help from another person to put on and taking off regular lower body clothing?: A Little 6 Click Score: 20    End of Session Equipment Utilized During Treatment: Gait belt;Rolling walker (2 wheels)  OT Visit Diagnosis: Unsteadiness on feet (R26.81);Other abnormalities of gait and mobility (R26.89);Pain   Activity Tolerance Patient tolerated treatment well;Patient limited by fatigue   Patient Left in bed;with call bell/phone within reach;with bed alarm set;with nursing/sitter in room   Nurse Communication          Time: 2633-3545 OT Time Calculation (min): 24 min  Charges: OT General Charges $OT Visit: 1 Visit OT Treatments $Self Care/Home Management : 23-37 mins  Nestor Lewandowsky, OTR/L Acute Rehabilitation Services Pager: [phone] Office: [phone]   [name] 03/24/2021, 12:20 PM

## 2021-03-24 NOTE — Progress Notes (Signed)
Physical Therapy Treatment Patient Details Name: [name] MRN: [id number] DOB: 11-22-1940 Today's Date: 03/24/2021   History of Present Illness Pt is a 80 y.o. female admitted 03/09/21 with L lung nodule for XI robotic-assisted thorascopy and LUL segmentectomy, L major thoracotomy. Chest tube removed 10/20; CXR shows increase L pleural effusion, increase in atelectasis. PMH includes osteopenia, DVT, GIB, whipple.    PT Comments    The pt continues to make slow but steady progress with mobility this morning. She was able to complete ~60 ft hallway ambulation with use of RW and minG-minA to steady. The pt continues to fatigue quickly and require seated rest 3-5 min to recover at this time, but is highly motivated to continue to progress endurance and strength. The pt also participated in strengthening for LE after ambulation, and tolerated mobility well with less UE support. Continue to recommend intensive therapies after d/c to maximize functional recovery and facilitate return to independence.   Although the pt scored 17 on AM-PAC PT "6 Clicks" Mobility Outcome Measure which indicated d/c home with HHPT, she continues to require assist due to fatigue and instability, and is not yet walking household distances without need for assistance which the pt lacks at home.    Recommendations for follow up therapy are one component of a multi-disciplinary discharge planning process, led by the attending physician.  Recommendations may be updated based on patient status, additional functional criteria and insurance authorization.  Follow Up Recommendations  Acute inpatient rehab (3hours/day)     Assistance Recommended at Discharge Frequent or constant Supervision/Assistance  Equipment Recommendations  Rolling walker (2 wheels)    Recommendations for Other Services       Precautions / Restrictions Precautions Precautions: Fall Precaution Comments: Watch vitals - does not wear home  O2 Restrictions Weight Bearing Restrictions: No     Mobility  Bed Mobility Overal bed mobility: Needs Assistance Bed Mobility: Supine to Sit Rolling: Modified independent (Device/Increase time)   Supine to sit: Modified independent (Device/Increase time)     General bed mobility comments: pt completing without assist but with increased time    Transfers Overall transfer level: Needs assistance Equipment used: Rolling walker (2 wheels);None Transfers: Sit to/from Stand             General transfer comment: minA initially, then progressed to minG with repeated attempts without use of BUE support to power up    Ambulation/Gait Ambulation/Gait assistance: Min assist Gait Distance (Feet): 60 Feet Assistive device: Rolling walker (2 wheels) Gait Pattern/deviations: Step-through pattern;Decreased stride length;Trunk flexed Gait velocity: 0.2 m/s Gait velocity interpretation: <1.31 ft/sec, indicative of household ambulator General Gait Details: Mild unsteadiness requiring RW and min A; cues for foot clearance and RW use; fatigued easily      Balance Overall balance assessment: Needs assistance Sitting-balance support: No upper extremity supported;Feet supported Sitting balance-Leahy Scale: Good Sitting balance - Comments: no UE support or assist   Standing balance support: Bilateral upper extremity supported Standing balance-Leahy Scale: Fair Standing balance comment: BUE support for gait due to fatigue, but can complete static stance without UE support                            Cognition Arousal/Alertness: Awake/alert Behavior During Therapy: WFL for tasks assessed/performed Overall Cognitive Status: Within Functional Limits for tasks assessed  General Comments: Cognition WFL for simple tasks; pt anxious regarding mobility and difficulty breathing requiring frequent cues for pursed lip breathing, requiring max  encouragement to progress mobility OOB        Exercises Other Exercises Other Exercises: sit-stand x5 from recliner. no UE support, minA initially to minG    General Comments        Pertinent Vitals/Pain Pain Assessment: No/denies pain Pain Location: pt denied pain through entire session Pain Intervention(s): Monitored during session     PT Goals (current goals can now be found in the care plan section) Acute Rehab PT Goals Patient Stated Goal: to get better PT Goal Formulation: With patient Time For Goal Achievement: 03/27/21 Potential to Achieve Goals: Good Progress towards PT goals: Progressing toward goals    Frequency    Min 3X/week      PT Plan Current plan remains appropriate       AM-PAC PT "6 Clicks" Mobility   Outcome Measure  Help needed turning from your back to your side while in a flat bed without using bedrails?: A Little Help needed moving from lying on your back to sitting on the side of a flat bed without using bedrails?: A Little Help needed moving to and from a bed to a chair (including a wheelchair)?: A Little Help needed standing up from a chair using your arms (e.g., wheelchair or bedside chair)?: A Little Help needed to walk in hospital room?: A Little Help needed climbing 3-5 steps with a railing? : A Lot 6 Click Score: 17    End of Session Equipment Utilized During Treatment: Gait belt Activity Tolerance: Patient tolerated treatment well Patient left: with call bell/phone within reach;in chair Nurse Communication: Mobility status PT Visit Diagnosis: Unsteadiness on feet (R26.81);Muscle weakness (generalized) (M62.81);Difficulty in walking, not elsewhere classified (R26.2)     Time: 6295-2841 PT Time Calculation (min) (ACUTE ONLY): 25 min  Charges:  $Gait Training: 8-22 mins $Therapeutic Activity: 8-22 mins                     [place] Carbo, PT, DPT   Acute Rehabilitation Department Pager #: [phone]   Sandra Cockayne 03/24/2021, 9:10 AM

## 2021-03-25 LAB — GLUCOSE, CAPILLARY
Glucose-Capillary: 153 mg/dL — ABNORMAL HIGH (ref 70–99)
Glucose-Capillary: 171 mg/dL — ABNORMAL HIGH (ref 70–99)
Glucose-Capillary: 166 mg/dL — ABNORMAL HIGH (ref 70–99)
Glucose-Capillary: 188 mg/dL — ABNORMAL HIGH (ref 70–99)

## 2021-03-25 MED ORDER — APIXABAN 2.5 MG PO TABS: 2.5000 mg | ORAL_TABLET | Freq: Two times a day (BID) | ORAL | Status: DC

## 2021-03-25 NOTE — TOC Progression Note (Signed)
Transition of Care [facility]) - Progression Note    Patient Details  Name: [name] MRN: [id number] Date of Birth: 1941/03/31  Transition of Care [facility]) CM/SW [place], [place] Phone Number: 03/25/2021, 8:29 AM  Clinical Narrative:    CSW spoke with admissions coordinator with [facility] and as of this AM there is no auth approved. Facility admission staff are available at [phone] over weekend and will contact CSW when they have received authorization. TOC will continue to follow at this time.    Expected Discharge Plan: [place] Barriers to Discharge: Continued Medical Work up  Expected Discharge Plan and Services Expected Discharge Plan: [place] In-house Referral: Clinical Social Work   Post Acute Care Choice: [place] Living arrangements for the past 2 months: Single Family Home                                       Social Determinants of Health (SDOH) Interventions    Readmission Risk Interventions No flowsheet data found.

## 2021-03-25 NOTE — Progress Notes (Addendum)
      [address]       [place] [address]             [phone]      16 Days Post-Op Procedure(s) (LRB): XI ROBOTIC ASSISTED THORASCOPY-LEFT UPPER LOBE SEGMENTECTOMY (Left) INTERCOSTAL NERVE BLOCK (Left) LYMPH NODE DISSECTION (Left) THORACOTOMY MAJOR (Left)  Subjective:  No new complaints, hoping to get to rehab today.  Objective: Vital signs in last 24 hours: Temp:  [97.8 F (36.6 C)-99.1 F (37.3 C)] 98.6 F (37 C) (10/29 0725) Pulse Rate:  [57-76] 76 (10/29 0725) Cardiac Rhythm: Normal sinus rhythm (10/29 0700) Resp:  [18-24] 19 (10/29 0725) BP: (118-133)/(42-54) 133/54 (10/29 0725) SpO2:  [95 %-98 %] 98 % (10/29 0725) Weight:  [55.2 kg] 55.2 kg (10/29 0532)   Intake/Output from previous day: 10/28 0701 - 10/29 0700 In: 470 [P.O.:470] Out: 1000 [Urine:1000]  General appearance: alert, cooperative, and no distress Heart: regular rate and rhythm Lungs: clear to auscultation bilaterally Abdomen: soft, non-tender; bowel sounds normal; no masses,  no organomegaly Wound: clean and dry, ecchymosis present  Lab Results: No results for input(s): WBC, HGB, HCT, PLT in the last 72 hours. BMET:  Recent Labs    03/24/21 0043  NA 134*  K 3.8  CL 100  CO2 27  GLUCOSE 232*  BUN 18  CREATININE 0.86  CALCIUM 8.4*    PT/INR: No results for input(s): LABPROT, INR in the last 72 hours. ABG    Component Value Date/Time   PHART 7.418 03/10/2021 0541   HCO3 20.4 03/10/2021 0541   TCO2 21 (L) 03/10/2021 0541   ACIDBASEDEF 3.0 (H) 03/10/2021 0541   O2SAT 100.0 03/10/2021 0541   CBG (last 3)  Recent Labs    03/24/21 1557 03/24/21 2059 03/25/21 0630  GLUCAP 190* 144* 153*    Assessment/Plan: S/P Procedure(s) (LRB): XI ROBOTIC ASSISTED THORASCOPY-LEFT UPPER LOBE SEGMENTECTOMY (Left) INTERCOSTAL NERVE BLOCK (Left) LYMPH NODE DISSECTION (Left) THORACOTOMY MAJOR (Left)  CV- NSR on Amiodarone, Lopressor Pulm- no acute issues, continue  IS DM- sugars are well controlled Pancreatic Insufficiency- continue Creon Deconditioning for inpatient rehab once insurance approval obtained Dispo-patient stable, remains medically stable for discharge to inpatient rehab, we are awaiting insurance authorization   LOS: 16 days    Ellwood Handler, PA-C 03/25/2021  Agree with above. Awaiting rehab bed.  Angeleah Labrake Bary Leriche

## 2021-03-26 LAB — GLUCOSE, CAPILLARY
Glucose-Capillary: 153 mg/dL — ABNORMAL HIGH (ref 70–99)
Glucose-Capillary: 140 mg/dL — ABNORMAL HIGH (ref 70–99)
Glucose-Capillary: 196 mg/dL — ABNORMAL HIGH (ref 70–99)
Glucose-Capillary: 219 mg/dL — ABNORMAL HIGH (ref 70–99)

## 2021-03-26 NOTE — TOC Progression Note (Signed)
Transition of Care [facility]) - Progression Note    Patient Details  Name: [name] MRN: [id number] Date of Birth: 04/22/1941  Transition of Care [facility]) CM/SW [place], [place] Phone Number: [phone] 03/26/2021, 1:20 PM  Clinical Narrative:    CSW spoke with pt in regards to [place] rehab with [facility] being denied. CSW inquired about pt being placed near the hospital. Pt explains it is harder for her daughter to visit therefore she would like to place near [place].  CSW sent to facilities around [place] and would need to follow up on Monday with facilities in [place] because most of them are not in the hub.  Pt also expressed maybe wanting to go home if a facility cannot be found near [facility].  TOC team will continue to assist with discharge planning needs.   Expected Discharge Plan: [place] Barriers to Discharge: Continued Medical Work up  Expected Discharge Plan and Services Expected Discharge Plan: [place] In-house Referral: Clinical Social Work   Post Acute Care Choice: [place] Living arrangements for the past 2 months: Single Family Home                                       Social Determinants of Health (SDOH) Interventions    Readmission Risk Interventions No flowsheet data found.

## 2021-03-26 NOTE — TOC Progression Note (Signed)
Transition of Care [facility]) - Progression Note    Patient Details  Name: [name] MRN: [id number] Date of Birth: 10/30/1940  Transition of Care [facility]) CM/SW [place], LCSW Phone Number:[phone] 03/26/2021, 4:44 PM  Clinical Narrative:     CSW provided denial letter from [employer] to patient.  TOC team will continue to assist with discharge planning needs.   Expected Discharge Plan: [place] Barriers to Discharge: Continued Medical Work up  Expected Discharge Plan and Services Expected Discharge Plan: [place] In-house Referral: Clinical Social Work   Post Acute Care Choice: [place] Living arrangements for the past 2 months: Single Family Home                                       Social Determinants of Health (SDOH) Interventions    Readmission Risk Interventions No flowsheet data found.

## 2021-03-26 NOTE — TOC Progression Note (Addendum)
Transition of Care [facility]) - Progression Note    Patient Details  Name: [name] MRN: [id number] Date of Birth: Jan 30, 1941  Transition of Care [facility]) CM/SW [place], [place] Phone Number: [phone] 03/26/2021, 10:34 AM  Clinical Narrative:     Per RNCM, MD reports that peer to peer has been denied. CSW will follow up with pt to discuss SNF option. Pt has been sent out previously over the hub.  TOC team will continue to assist with discharge planning needs.   Expected Discharge Plan: [place] Barriers to Discharge: Continued Medical Work up  Expected Discharge Plan and Services Expected Discharge Plan: [place] In-house Referral: Clinical Social Work   Post Acute Care Choice: [place] Living arrangements for the past 2 months: Single Family Home                                       Social Determinants of Health (SDOH) Interventions    Readmission Risk Interventions No flowsheet data found.

## 2021-03-26 NOTE — TOC Progression Note (Signed)
Transition of Care [facility]) - Progression Note    Patient Details  Name: [name] MRN: [id number] Date of Birth: [birth year]  Transition of Care [facility]) CM/SW [place], [place] Phone Number: [phone] 03/26/2021, 8:57 AM  Clinical Narrative:     CSW received a call from Stephanie([phone]) with [employer] stating that pt's authorization is needing a peer to peer with Dr. Ellard Artis at [phone] by 2pm today.   CSW will alert MD that this would need to be completed today bt 2pm.  TOC team will continue to assist with discharge planning needs.   Expected Discharge Plan: [place] Barriers to Discharge: Continued Medical Work up  Expected Discharge Plan and Services Expected Discharge Plan: [place] In-house Referral: Clinical Social Work   Post Acute Care Choice: [place] Living arrangements for the past 2 months: Single Family Home                                       Social Determinants of Health (SDOH) Interventions    Readmission Risk Interventions No flowsheet data found.

## 2021-03-26 NOTE — Progress Notes (Addendum)
      [address]       [place] [address]             [phone]       17 Days Post-Op Procedure(s) (LRB): XI ROBOTIC ASSISTED THORASCOPY-LEFT UPPER LOBE SEGMENTECTOMY (Left) INTERCOSTAL NERVE BLOCK (Left) LYMPH NODE DISSECTION (Left) THORACOTOMY MAJOR (Left)  Subjective:  No new complaints.  Remains frustrated waiting on insurance authorization.  Objective: Vital signs in last 24 hours: Temp:  [97.4 F (36.3 C)-98.9 F (37.2 C)] 97.4 F (36.3 C) (10/30 0433) Pulse Rate:  [53-63] 57 (10/30 0433) Cardiac Rhythm: Sinus bradycardia (10/30 0700) Resp:  [19-24] 20 (10/30 0433) BP: (114-120)/(41-54) 117/41 (10/30 0433) SpO2:  [94 %-98 %] 95 % (10/30 0433) Weight:  [56.5 kg] 56.5 kg (10/30 0433)  Intake/Output from previous day: 10/29 0701 - 10/30 0700 In: 720 [P.O.:720] Out: 1200 [Urine:1200]  General appearance: alert, cooperative, and no distress Heart: regular rate and rhythm Lungs: clear to auscultation bilaterally Abdomen: soft, non-tender; bowel sounds normal; no masses,  no organomegaly Extremities: extremities normal, atraumatic, no cyanosis or edema Wound: clean and dry, ecchymosis left flank  Lab Results: No results for input(s): WBC, HGB, HCT, PLT in the last 72 hours. BMET:  Recent Labs    03/24/21 0043  NA 134*  K 3.8  CL 100  CO2 27  GLUCOSE 232*  BUN 18  CREATININE 0.86  CALCIUM 8.4*    PT/INR: No results for input(s): LABPROT, INR in the last 72 hours. ABG    Component Value Date/Time   PHART 7.418 03/10/2021 0541   HCO3 20.4 03/10/2021 0541   TCO2 21 (L) 03/10/2021 0541   ACIDBASEDEF 3.0 (H) 03/10/2021 0541   O2SAT 100.0 03/10/2021 0541   CBG (last 3)  Recent Labs    03/25/21 1530 03/25/21 2116 03/26/21 0623  GLUCAP 166* 188* 153*    Assessment/Plan: S/P Procedure(s) (LRB): XI ROBOTIC ASSISTED THORASCOPY-LEFT UPPER LOBE SEGMENTECTOMY (Left) INTERCOSTAL NERVE BLOCK (Left) LYMPH NODE DISSECTION  (Left) THORACOTOMY MAJOR (Left)  CV- PAF, currently maintaining NSR on Amiodarone, Lopressor, Eliquis Pulm- continue IS DM- sugars controlled Pancreatic Insufficiency-on Creon Deconditioning- patients insurance will not provide authorization for inpatient rehab.  Peer to peer has been completed and they feel she will likely require a longer stay and requires SNF placement, the provider was willing to provide authorization for this....  Dispo- patient ready for d/c to SNF once bed is available, CSW has been notified   LOS: 17 days    Ellwood Handler, PA-C 03/26/2021  Agree with above. Dispo planning for SNF now. Continue physical therapy  [name]

## 2021-03-26 NOTE — NC FL2 (Signed)
[place] MEDICAID FL2 LEVEL OF CARE SCREENING TOOL     IDENTIFICATION  Patient Name: [name] Birthdate: 07-Apr-1941 Sex: female Admission Date (Current Location): 03/09/2021  [facility] and [place] Number:  Herbalist and Address:  [facility] [place], [place] [address], [place], [place] [address]      Provider Number: [id number]  Attending Physician Name and Address:  [name], MD  Relative Name and Phone Number:  Melvyn Novas, [phone]    Current Level of Care: Hospital Recommended Level of Care: [place] Prior Approval Number:    Date Approved/Denied:   PASRR Number: [id number]  Discharge Plan: SNF    Current Diagnoses: Patient Active Problem List   Diagnosis Date Noted   Lung cancer ([place]) 03/10/2021   S/p Xi robotic assisted LUL segmentectomy, LN dissection, intercostal nerve block, followed by left thoroacotomy for control of bleeding. 03/09/2021   endometrioid endometrial    Endometrial cancer ([place]) 10/02/2016   Endometrial adenocarcinoma ([place]) 09/10/2016   Essential hypertension    Acute pancreatitis 07/08/2016   Secondary diabetes mellitus ([place]) 07/08/2016   History of partial pancreatectomy 07/08/2016   History of malignant gastrointestinal stromal tumor (GIST) 07/08/2016   Hyperlipidemia 05/14/2016   Exocrine pancreatic insufficiency 06/04/2011   Chest pain 11/20/2010   GIST, malignant T2N0 11/20/2010   Abdominal pain, epigastric 11/20/2010    Orientation RESPIRATION BLADDER Height & Weight     Self, Time, Situation, Place  O2 Continent, External catheter Weight: 124 lb 9 oz (56.5 kg) Height:  5' 5" (165.1 cm)  BEHAVIORAL SYMPTOMS/MOOD NEUROLOGICAL BOWEL NUTRITION STATUS      Continent Diet  AMBULATORY STATUS COMMUNICATION OF NEEDS Skin   Extensive Assist Verbally Surgical wounds, Skin abrasions                       Personal Care Assistance Level of Assistance  Bathing,  Feeding, Dressing Bathing Assistance: Maximum assistance Feeding assistance: Limited assistance Dressing Assistance: Maximum assistance     Functional Limitations Info  Sight, Hearing, Speech Sight Info: Adequate Hearing Info: Adequate Speech Info: Adequate    SPECIAL CARE FACTORS FREQUENCY  PT (By licensed PT), OT (By licensed OT)     PT Frequency: 5x weekly OT Frequency: 5x weekly            Contractures Contractures Info: Not present    Additional Factors Info  Code Status, Allergies, Insulin Sliding Scale Code Status Info: full Allergies Info: ibuprofen   Insulin Sliding Scale Info: novolog 0-9 U 3x daily w/ meals       Current Medications (03/26/2021):  This is the current hospital active medication list Current Facility-Administered Medications  Medication Dose Route Frequency Provider Last Rate Last Admin   0.9 %  sodium chloride infusion (Manually program via Guardrails IV Fluids)   Intravenous Once [name], MD   Held at 03/12/21 1831   0.9 %  sodium chloride infusion   Intravenous PRN [name], MD 10 mL/hr at 03/12/21 1700 Infusion Verify at 03/12/21 1700   amiodarone (PACERONE) tablet 200 mg  200 mg Oral BID Lars Pinks M, PA-C   200 mg at 03/25/21 2200   apixaban (ELIQUIS) tablet 2.5 mg  2.5 mg Oral BID [name], MD   2.5 mg at 03/25/21 2200   bisacodyl (DULCOLAX) EC tablet 10 mg  10 mg Oral Daily [name], MD   10 mg at 03/23/21 1015  chlorhexidine gluconate (MEDLINE KIT) (PERIDEX) 0.12 % solution 15 mL  15 mL Mouth Rinse BID [name], MD   15 mL at 03/25/21 2201   docusate sodium (COLACE) capsule 100 mg  100 mg Oral BID [name], MD   100 mg at 03/25/21 1022   empagliflozin (JARDIANCE) tablet 10 mg  10 mg Oral Daily [name], MD   10 mg at 03/25/21 1019   fentaNYL (SUBLIMAZE) injection 25-50 mcg  25-50 mcg Intravenous Q2H PRN [name], MD   50 mcg  at 03/11/21 0444   insulin aspart (novoLOG) injection 0-9 Units  0-9 Units Subcutaneous TID WC [name], MD   2 Units at 03/26/21 0646   ipratropium-albuterol (DUONEB) 0.5-2.5 (3) MG/3ML nebulizer solution 3 mL  3 mL Nebulization Q6H PRN [name], MD       labetalol (NORMODYNE) injection 10 mg  10 mg Intravenous Q2H PRN Noemi Chapel P, DO   10 mg at 03/13/21 1906   lipase/protease/amylase (CREON) capsule 12,000 Units  12,000 Units Oral TID Clemencia Course, MD   12,000 Units at 03/26/21 0815   magic mouthwash w/lidocaine  5 mL Oral TID PRN [name], MD       melatonin tablet 10 mg  10 mg Oral QHS PRN [name], MD   10 mg at 03/16/21 2101   metoprolol tartrate (LOPRESSOR) tablet 50 mg  50 mg Oral BID [name], MD   50 mg at 03/25/21 2200   ondansetron (ZOFRAN) injection 4 mg  4 mg Intravenous Q6H PRN [name], MD       polyethylene glycol (MIRALAX / GLYCOLAX) packet 17 g  17 g Per Tube Daily [name], MD   17 g at 03/18/21 2979   senna-docusate (Senokot-S) tablet 1 tablet  1 tablet Oral QHS [name], MD   1 tablet at 03/17/21 2019   simvastatin (ZOCOR) tablet 20 mg  20 mg Oral QHS [name], MD   20 mg at 03/25/21 2200   sucralfate (CARAFATE) tablet 1 g  1 g Oral BID [name], MD   1 g at 03/25/21 2159   traMADol (ULTRAM) tablet 50-100 mg  50-100 mg Oral Q6H PRN [name], MD   100 mg at 03/15/21 1709     Discharge Medications: Please see discharge summary for a list of discharge medications.  Relevant Imaging Results:  Relevant Lab Results:   Additional Information SS# [place], [place]

## 2021-03-27 LAB — GLUCOSE, CAPILLARY
Glucose-Capillary: 148 mg/dL — ABNORMAL HIGH (ref 70–99)
Glucose-Capillary: 171 mg/dL — ABNORMAL HIGH (ref 70–99)
Glucose-Capillary: 163 mg/dL — ABNORMAL HIGH (ref 70–99)
Glucose-Capillary: 150 mg/dL — ABNORMAL HIGH (ref 70–99)

## 2021-03-27 NOTE — Progress Notes (Addendum)
      [address]       [place] [address]             [phone]       17 Days Post-Op Procedure(s) (LRB): XI ROBOTIC ASSISTED THORASCOPY-LEFT UPPER LOBE SEGMENTECTOMY (Left) INTERCOSTAL NERVE BLOCK (Left) LYMPH NODE DISSECTION (Left) THORACOTOMY MAJOR (Left)  Subjective:  No new complaints.  Difficulty with constipation yesterday but has had BM x 2 since then.   Objective: Vital signs in last 24 hours: Temp:  [97.8 F (36.6 C)-99 F (37.2 C)] 98.4 F (36.9 C) (10/31 0417) Pulse Rate:  [56-78] 56 (10/31 0417) Cardiac Rhythm: Normal sinus rhythm (10/31 0000) Resp:  [16-21] 16 (10/31 0417) BP: (93-129)/(46-72) 121/48 (10/31 0417) SpO2:  [94 %-98 %] 98 % (10/31 0417) Weight:  [57.9 kg] 57.9 kg (10/31 0500)  Intake/Output from previous day: 10/30 0701 - 10/31 0700 In: 240 [P.O.:240] Out: 1550 [Urine:1550]  General appearance: alert, cooperative, and no distress Heart: regular rate and rhythm Lungs: breath sounds are clear, good sats on RA. Abdomen: soft, non-tender Extremities:  no edema Wound: clean and dry, ecchymosis left flank  Lab Results: No results for input(s): WBC, HGB, HCT, PLT in the last 72 hours. BMET:  No results for input(s): NA, K, CL, CO2, GLUCOSE, BUN, CREATININE, CALCIUM in the last 72 hours.   PT/INR: No results for input(s): LABPROT, INR in the last 72 hours. ABG    Component Value Date/Time   PHART 7.418 03/10/2021 0541   HCO3 20.4 03/10/2021 0541   TCO2 21 (L) 03/10/2021 0541   ACIDBASEDEF 3.0 (H) 03/10/2021 0541   O2SAT 100.0 03/10/2021 0541   CBG (last 3)  Recent Labs    03/26/21 1609 03/26/21 2130 03/27/21 0649  GLUCAP 196* 219* 148*     Assessment/Plan: S/P Procedure(s) (LRB): XI ROBOTIC ASSISTED THORASCOPY-LEFT UPPER LOBE SEGMENTECTOMY (Left) INTERCOSTAL NERVE BLOCK (Left) LYMPH NODE DISSECTION (Left) THORACOTOMY MAJOR (Left)  CV- PAF early post-op, continues to maintain NSR on Amiodarone,  Lopressor, Eliquis Pulm- continue IS DM- sugars controlled Pancreatic Insufficiency-on Creon Deconditioning- patients insurance will not provide authorization for inpatient rehab.  TOC team working on SNF placement, Dispo- patient ready for d/c to SNF once bed is available, CSW has been notified   LOS: 18 days    Antony Odea, PA-C 03/27/2021  Patient seen and examined, agree with above Ambulate  Remo Lipps C. Roxan Hockey, MD [facility] [phone]

## 2021-03-27 NOTE — Plan of Care (Signed)
  Problem: Education: Goal: Knowledge of General Education information will improve Description: Including pain rating scale, medication(s)/side effects and non-pharmacologic comfort measures Outcome: Progressing   Problem: Health Behavior/Discharge Planning: Goal: Ability to manage health-related needs will improve Outcome: Progressing   Problem: Activity: Goal: Risk for activity intolerance will decrease Outcome: Progressing   Problem: Nutrition: Goal: Adequate nutrition will be maintained Outcome: Progressing   Problem: Coping: Goal: Level of anxiety will decrease Outcome: Progressing   Problem: Elimination: Goal: Will not experience complications related to bowel motility Outcome: Progressing Goal: Will not experience complications related to urinary retention Outcome: Progressing   Problem: Pain Managment: Goal: General experience of comfort will improve Outcome: Progressing

## 2021-03-27 NOTE — Progress Notes (Signed)
Occupational Therapy Treatment Patient Details Name: [name] MRN: [id number] DOB: 12/12/1940 Today's Date: 03/27/2021   History of present illness Pt is a 80 y.o. female admitted 03/09/21 with L lung nodule for XI robotic-assisted thorascopy and LUL segmentectomy, L major thoracotomy. Chest tube removed 10/20; CXR shows increase L pleural effusion, increase in atelectasis. PMH includes osteopenia, DVT, GIB, whipple.   OT comments  Pt progressing steadily in activity tolerance and ADL independence. Pt reports having ambulated in hall with PT and rollator, sitting in chair x 2 hours. Pt ambulated with min guard assist and RW, completed toileting, grooming and dressing with up to min guard assist. Pt educated at length in energy conservation strategies. Pt states she prefers to return home as opposed to SNF and she can have friends stay during the day and her daughter at night initially. Updated recommendation to [place].    Recommendations for follow up therapy are one component of a multi-disciplinary discharge planning process, led by the attending physician.  Recommendations may be updated based on patient status, additional functional criteria and insurance authorization.    Follow Up Recommendations  Home health OT    Assistance Recommended at Discharge Frequent or constant Supervision/Assistance (initially)  Equipment Recommendations  BSC;Tub/shower seat    Recommendations for Other Services      Precautions / Restrictions Precautions Precautions: Fall Precaution Comments: incontinent       Mobility Bed Mobility Overal bed mobility: Modified Independent       Supine to sit: Modified independent (Device/Increase time);HOB elevated          Transfers Overall transfer level: Needs assistance Equipment used: Rolling walker (2 wheels) Transfers: Sit to/from Stand Sit to Stand: Supervision           General transfer comment: Verbal cues for hand placement      Balance Overall balance assessment: Needs assistance Sitting-balance support: No upper extremity supported;Feet supported Sitting balance-Leahy Scale: Good     Standing balance support: No upper extremity supported Standing balance-Leahy Scale: Fair Standing balance comment: fair statically, poor dynamic                           ADL either performed or assessed with clinical judgement   ADL Overall ADL's : Needs assistance/impaired Eating/Feeding: Set up;Bed level   Grooming: Oral care;Standing;Min guard           Upper Body Dressing : Set up;Sitting   Lower Body Dressing: Min guard;Sit to/from stand   Toilet Transfer: Min guard;Ambulation;BSC;Rolling walker (2 wheels)   Toileting- Clothing Manipulation and Hygiene: Min guard;Sit to/from stand       Functional mobility during ADLs: Min guard;Rolling walker (2 wheels) General ADL Comments: Educated in benefits of rollator for energy conservation, pacing and pursed lip breathing. Recommended seated showering with supervision.     Vision       Perception     Praxis      Cognition Arousal/Alertness: Awake/alert Behavior During Therapy: WFL for tasks assessed/performed Overall Cognitive Status: Within Functional Limits for tasks assessed                                 General Comments: pt appears well aware of her deficits and need to pace herself with activitie when returning home          Exercises     Shoulder Instructions  General Comments VSS on RA. SpO2 >90% with amb. Dyspnea 3/4 with activity    Pertinent Vitals/ Pain       Pain Assessment: No/denies pain  Home Living                                          Prior Functioning/Environment              Frequency  Min 2X/week        Progress Toward Goals  OT Goals(current goals can now be found in the care plan section)  Progress towards OT goals: Progressing toward goals  Acute  Rehab OT Goals Patient Stated Goal: to go home with assist of her daughter and friends OT Goal Formulation: With patient Time For Goal Achievement: 04/04/21 Potential to Achieve Goals: [place] Discharge plan needs to be updated    Co-evaluation                 AM-PAC OT "6 Clicks" Daily Activity     Outcome Measure   Help from another person eating meals?: None Help from another person taking care of personal grooming?: A Little Help from another person toileting, which includes using toliet, bedpan, or urinal?: A Little Help from another person bathing (including washing, rinsing, drying)?: A Little Help from another person to put on and taking off regular upper body clothing?: None Help from another person to put on and taking off regular lower body clothing?: A Little 6 Click Score: 20    End of Session Equipment Utilized During Treatment: Gait belt;Rolling walker (2 wheels)  OT Visit Diagnosis: Unsteadiness on feet (R26.81);Other abnormalities of gait and mobility (R26.89);Pain   Activity Tolerance Patient tolerated treatment well   Patient Left in bed;with call bell/phone within reach;with bed alarm set   Nurse Communication          Time: 1335-1401 OT Time Calculation (min): 26 min  Charges: OT General Charges $OT Visit: 1 Visit OT Treatments $Self Care/Home Management : 23-37 mins  Nestor Lewandowsky, OTR/L Acute Rehabilitation Services Pager: [phone] Office: [phone]   [name] 03/27/2021, 4:28 PM

## 2021-03-27 NOTE — Progress Notes (Signed)
Mobility Specialist Progress Note    03/27/21 1638  Mobility  Activity Ambulated in hall  Level of Assistance Contact guard assist, steadying assist  Assistive Device Four wheel walker  Distance Ambulated (ft) 180 ft  Mobility Ambulated with assistance in hallway  Mobility Response Tolerated fair  Mobility performed by Mobility specialist  $Mobility charge 1 Mobility   Post-Mobility: 61 HR, 93% SpO2  Pt received in bed and agreeable. C/o being lightheaded when sitting up and during walk. Became SOB during walk and still breathing heavily when returned to bed. Call bell in reach and RN notified.   Hildred Alamin Mobility Specialist  Mobility Specialist Phone: [phone]

## 2021-03-27 NOTE — Progress Notes (Signed)
Physical Therapy Treatment Patient Details Name: [name] MRN: [id number] DOB: 1941/02/14 Today's Date: 03/27/2021   History of Present Illness Pt is a 80 y.o. female admitted 03/09/21 with L lung nodule for XI robotic-assisted thorascopy and LUL segmentectomy, L major thoracotomy. Chest tube removed 10/20; CXR shows increase L pleural effusion, increase in atelectasis. PMH includes osteopenia, DVT, GIB, whipple.    PT Comments    Pt making good progress with all mobility. Pt believes she can get daughter to assist at night and have some friends help during the day. Believe from PT standpoint pt is very close to being able to return home is she can get some support at home.   Recommendations for follow up therapy are one component of a multi-disciplinary discharge planning process, led by the attending physician.  Recommendations may be updated based on patient status, additional functional criteria and insurance authorization.  Follow Up Recommendations  Home health PT     Assistance Recommended at Discharge Intermittent Supervision/Assistance  Equipment Recommendations  Rollator (4 wheels)    Recommendations for Other Services       Precautions / Restrictions Precautions Precautions: Fall Precaution Comments: incontinent     Mobility  Bed Mobility Overal bed mobility: Modified Independent       Supine to sit: Modified independent (Device/Increase time);HOB elevated          Transfers Overall transfer level: Needs assistance Equipment used: Rollator (4 wheels)   Sit to Stand: Supervision           General transfer comment: Verbal cues for hand placement    Ambulation/Gait Ambulation/Gait assistance: Supervision Gait Distance (Feet): 175 Feet Assistive device: Rollator (4 wheels) Gait Pattern/deviations: Step-through pattern;Decreased stride length Gait velocity: decr Gait velocity interpretation: <1.31 ft/sec, indicative of household  ambulator General Gait Details: Assist for lines/safety. No loss of balance   Stairs             Wheelchair Mobility    Modified Rankin (Stroke Patients Only)       Balance Overall balance assessment: Needs assistance Sitting-balance support: No upper extremity supported;Feet supported Sitting balance-Leahy Scale: Good     Standing balance support: No upper extremity supported Standing balance-Leahy Scale: Fair                              Cognition Arousal/Alertness: Awake/alert Behavior During Therapy: WFL for tasks assessed/performed Overall Cognitive Status: Within Functional Limits for tasks assessed                                          Exercises      General Comments General comments (skin integrity, edema, etc.): VSS on RA. SpO2 >90% with amb. Dyspnea 3/4 with activity      Pertinent Vitals/Pain Pain Assessment: No/denies pain    Home Living                          Prior Function            PT Goals (current goals can now be found in the care plan section) Acute Rehab PT Goals Patient Stated Goal: Go home Progress towards PT goals: Goals met and updated - see care plan    Frequency    Min 3X/week      PT Plan Discharge  plan needs to be updated    Co-evaluation              AM-PAC PT "6 Clicks" Mobility   Outcome Measure  Help needed turning from your back to your side while in a flat bed without using bedrails?: None Help needed moving from lying on your back to sitting on the side of a flat bed without using bedrails?: None Help needed moving to and from a bed to a chair (including a wheelchair)?: A Little Help needed standing up from a chair using your arms (e.g., wheelchair or bedside chair)?: A Little Help needed to walk in hospital room?: A Little Help needed climbing 3-5 steps with a railing? : A Little 6 Click Score: 20    End of Session   Activity Tolerance: Patient  tolerated treatment well Patient left: in chair;with chair alarm set;with call bell/phone within reach Nurse Communication: Mobility status PT Visit Diagnosis: Unsteadiness on feet (R26.81);Muscle weakness (generalized) (M62.81);Difficulty in walking, not elsewhere classified (R26.2)     Time: 8737-3081 PT Time Calculation (min) (ACUTE ONLY): 18 min  Charges:  $Gait Training: 8-22 mins                     [place] Pager [phone] Office [place] 03/27/2021, 2:04 PM

## 2021-03-28 ENCOUNTER — Ambulatory Visit: Payer: PPO | Admitting: Thoracic Surgery (Cardiothoracic Vascular Surgery)

## 2021-03-28 LAB — GLUCOSE, CAPILLARY
Glucose-Capillary: 150 mg/dL — ABNORMAL HIGH (ref 70–99)
Glucose-Capillary: 161 mg/dL — ABNORMAL HIGH (ref 70–99)
Glucose-Capillary: 137 mg/dL — ABNORMAL HIGH (ref 70–99)
Glucose-Capillary: 167 mg/dL — ABNORMAL HIGH (ref 70–99)

## 2021-03-28 NOTE — Plan of Care (Signed)
  Problem: Education: Goal: Knowledge of General Education information will improve Description: Including pain rating scale, medication(s)/side effects and non-pharmacologic comfort measures Outcome: Progressing   Problem: Health Behavior/Discharge Planning: Goal: Ability to manage health-related needs will improve Outcome: Progressing   Problem: Activity: Goal: Risk for activity intolerance will decrease Outcome: Progressing   Problem: Nutrition: Goal: Adequate nutrition will be maintained Outcome: Progressing   Problem: Coping: Goal: Level of anxiety will decrease Outcome: Progressing   Problem: Pain Managment: Goal: General experience of comfort will improve Outcome: Progressing   Problem: Skin Integrity: Goal: Risk for impaired skin integrity will decrease Outcome: Progressing

## 2021-03-28 NOTE — Progress Notes (Addendum)
      [address]       [place] [address]             [phone]       17 Days Post-Op Procedure(s) (LRB): XI ROBOTIC ASSISTED THORASCOPY-LEFT UPPER LOBE SEGMENTECTOMY (Left) INTERCOSTAL NERVE BLOCK (Left) LYMPH NODE DISSECTION (Left) THORACOTOMY MAJOR (Left)  Subjective:  No new complaints.  Bowel function returned. Feels she is gaining strength and would like to return home with home health PT/OT.  Objective: Vital signs in last 24 hours: Temp:  [97.5 F (36.4 C)-99.3 F (37.4 C)] 97.5 F (36.4 C) (11/01 0437) Pulse Rate:  [55-90] 59 (11/01 0437) Cardiac Rhythm: Sinus bradycardia (11/01 0700) Resp:  [16-25] 20 (11/01 0437) BP: (116-130)/(40-52) 130/48 (11/01 0437) SpO2:  [92 %-98 %] 96 % (11/01 0437) Weight:  [57.5 kg] 57.5 kg (11/01 0437)  Intake/Output from previous day: 10/31 0701 - 11/01 0700 In: 720 [P.O.:720] Out: 951 [Urine:950; Stool:1]  General appearance: alert, cooperative, and no distress Heart: regular rate and rhythm Lungs: breath sounds are clear, good sats on RA. Abdomen: soft, non-tender Extremities:  no edema Wound: clean and dry, ecchymosis left flank  Lab Results: No results for input(s): WBC, HGB, HCT, PLT in the last 72 hours. BMET:  No results for input(s): NA, K, CL, CO2, GLUCOSE, BUN, CREATININE, CALCIUM in the last 72 hours.   PT/INR: No results for input(s): LABPROT, INR in the last 72 hours. ABG    Component Value Date/Time   PHART 7.418 03/10/2021 0541   HCO3 20.4 03/10/2021 0541   TCO2 21 (L) 03/10/2021 0541   ACIDBASEDEF 3.0 (H) 03/10/2021 0541   O2SAT 100.0 03/10/2021 0541   CBG (last 3)  Recent Labs    03/27/21 1550 03/27/21 2119 03/28/21 0625  GLUCAP 163* 150* 150*     Assessment/Plan: S/P Procedure(s) (LRB): XI ROBOTIC ASSISTED THORASCOPY-LEFT UPPER LOBE SEGMENTECTOMY (Left) INTERCOSTAL NERVE BLOCK (Left) LYMPH NODE DISSECTION (Left) THORACOTOMY MAJOR (Left)  CV- PAF early post-op,  continues to maintain NSR on Amiodarone, Lopressor, Eliquis Pulm- stable, good sats on RA.continue IS DM- sugars controlled Pancreatic Insufficiency-on Creon Deconditioning- strength and mobility have improved while waiting several days for insurance approval for inpatient rehab. PT and OT have upgraded recommendations for discharge to home with home health services. Dispo- will arrange for [facility] PT/OT, and plan for discharge tomorrow.   LOS: 19 days    Antony Odea, PA-C 03/28/2021  Patient seen and examined, agree with above Continues to make slow but steady progress Plan for dc home with PT once arrangements complete  Remo Lipps C. Roxan Hockey, MD [facility] [phone]

## 2021-03-28 NOTE — TOC Progression Note (Addendum)
Transition of Care [facility]) - Progression Note    Patient Details  Name: [name] MRN: [id number] Date of Birth: 12-Dec-1940  Transition of Care [facility]) CM/SW Contact  Zenon Mayo, RN Phone Number: 03/28/2021, 1:36 PM  Clinical Narrative:    NCM asked MD if patient could go home today, he states the daughter works as Education officer, museum and she was not ready for her to come home today but will be ready tomorrow.  NCM offered choice, she does not have a preference but would like to try [facility], if they can not then try [facility]. NCM made referral to [facility] with [facility] for [place].  Awaiting to hear  back.   Expected Discharge Plan: [place] Barriers to Discharge: Continued Medical Work up  Expected Discharge Plan and Services Expected Discharge Plan: [place] In-house Referral: Clinical Social Work   Post Acute Care Choice: [place] Living arrangements for the past 2 months: Single Family Home                                       Social Determinants of Health (SDOH) Interventions    Readmission Risk Interventions No flowsheet data found.

## 2021-03-28 NOTE — Progress Notes (Signed)
Physical Therapy Treatment Patient Details Name: [name] MRN: [id number] DOB: 1940-08-15 Today's Date: 03/28/2021   History of Present Illness Pt is a 80 y.o. female admitted 03/09/21 with L lung nodule for XI robotic-assisted thorascopy and LUL segmentectomy, L major thoracotomy. Chest tube removed 10/20; CXR shows increase L pleural effusion, increase in atelectasis. PMH includes osteopenia, DVT, GIB, whipple.    PT Comments    Pt continues to make steady progress and is preparing to return home. Recommend HHPT for further rehab at DC.    Recommendations for follow up therapy are one component of a multi-disciplinary discharge planning process, led by the attending physician.  Recommendations may be updated based on patient status, additional functional criteria and insurance authorization.  Follow Up Recommendations  Home health PT     Assistance Recommended at Discharge Intermittent Supervision/Assistance  Equipment Recommendations  Rollator (4 wheels)    Recommendations for Other Services       Precautions / Restrictions Precautions Precautions: Fall Precaution Comments: incontinent     Mobility  Bed Mobility Overal bed mobility: Modified Independent Bed Mobility: Supine to Sit;Sit to Supine Rolling: Modified independent (Device/Increase time)   Supine to sit: Modified independent (Device/Increase time);HOB elevated     General bed mobility comments: no assist or difficulty    Transfers Overall transfer level: Needs assistance Equipment used: Rollator (4 wheels) Transfers: Sit to/from Stand Sit to Stand: Supervision           General transfer comment: Verbal cues for hand placement    Ambulation/Gait Ambulation/Gait assistance: Supervision Gait Distance (Feet): 200 Feet Assistive device: Rollator (4 wheels) Gait Pattern/deviations: Step-through pattern;Decreased stride length Gait velocity: decr Gait velocity interpretation: <1.31 ft/sec,  indicative of household ambulator General Gait Details: Slow, steady gait with rollator. Pt paces herself to complete task   Stairs             Wheelchair Mobility    Modified Rankin (Stroke Patients Only)       Balance Overall balance assessment: Needs assistance Sitting-balance support: No upper extremity supported;Feet supported Sitting balance-Leahy Scale: Good     Standing balance support: No upper extremity supported Standing balance-Leahy Scale: Fair                              Cognition Arousal/Alertness: Awake/alert Behavior During Therapy: WFL for tasks assessed/performed Overall Cognitive Status: Within Functional Limits for tasks assessed                                          Exercises      General Comments General comments (skin integrity, edema, etc.): VSS on RA      Pertinent Vitals/Pain Pain Assessment: No/denies pain    Home Living                          Prior Function            PT Goals (current goals can now be found in the care plan section) Acute Rehab PT Goals Patient Stated Goal: Go home Progress towards PT goals: Progressing toward goals    Frequency    Min 3X/week      PT Plan Current plan remains appropriate    Co-evaluation  AM-PAC PT "6 Clicks" Mobility   Outcome Measure  Help needed turning from your back to your side while in a flat bed without using bedrails?: None Help needed moving from lying on your back to sitting on the side of a flat bed without using bedrails?: None Help needed moving to and from a bed to a chair (including a wheelchair)?: A Little Help needed standing up from a chair using your arms (e.g., wheelchair or bedside chair)?: A Little Help needed to walk in hospital room?: A Little Help needed climbing 3-5 steps with a railing? : A Little 6 Click Score: 20    End of Session   Activity Tolerance: Patient tolerated treatment  well Patient left: in bed;with call bell/phone within reach Nurse Communication: Mobility status PT Visit Diagnosis: Unsteadiness on feet (R26.81);Muscle weakness (generalized) (M62.81);Difficulty in walking, not elsewhere classified (R26.2)     Time: 2419-9144 PT Time Calculation (min) (ACUTE ONLY): 15 min  Charges:  $Gait Training: 8-22 mins                     [place] Pager [phone] Office [place] 03/28/2021, 1:15 PM

## 2021-03-28 NOTE — Progress Notes (Signed)
Mobility Specialist Progress Note    03/28/21 1557  Mobility  Activity Refused mobility   Pt: "It wouldn't hurt my feelings if we don't get up to walk today. I am going home."  [facility] Mobility Specialist  Mobility Specialist Phone: [phone]

## 2021-03-28 NOTE — TOC Progression Note (Incomplete)
Transition of Care [facility]) - Progression Note    Patient Details  Name: [name] MRN: [id number] Date of Birth: [birth year]  Transition of Care [facility]) CM/SW Contact  Tom-Johnson, Renea Ee, RN Phone Number: 03/28/2021, 3:50 PM  Clinical Narrative:    CM called [employer] and spoke with Jodell Cipro [phone]) and ordered Rollator for patient which will be delivered at bedside. CM will continue to follow with needs.   Expected Discharge Plan: [place] Barriers to Discharge: Continued Medical Work up  Expected Discharge Plan and Services Expected Discharge Plan: [place] In-house Referral: Clinical Social Work   Post Acute Care Choice: [place] Living arrangements for the past 2 months: Single Family Home                                       Social Determinants of Health (SDOH) Interventions    Readmission Risk Interventions No flowsheet data found.

## 2021-03-29 LAB — GLUCOSE, CAPILLARY
Glucose-Capillary: 140 mg/dL — ABNORMAL HIGH (ref 70–99)
Glucose-Capillary: 200 mg/dL — ABNORMAL HIGH (ref 70–99)

## 2021-03-29 LAB — CBC
HCT: 33.8 % — ABNORMAL LOW (ref 36.0–46.0)
Hemoglobin: 10.6 g/dL — ABNORMAL LOW (ref 12.0–15.0)
MCH: 29.2 pg (ref 26.0–34.0)
MCHC: 31.4 g/dL (ref 30.0–36.0)
MCV: 93.1 fL (ref 80.0–100.0)
Platelets: 373 10*3/uL (ref 150–400)
RBC: 3.63 MIL/uL — ABNORMAL LOW (ref 3.87–5.11)
RDW: 16.4 % — ABNORMAL HIGH (ref 11.5–15.5)
WBC: 8.2 10*3/uL (ref 4.0–10.5)
nRBC: 0 % (ref 0.0–0.2)

## 2021-03-29 MED ORDER — TRAMADOL HCL 50 MG PO TABS: 50.0000 mg | ORAL_TABLET | Freq: Four times a day (QID) | ORAL | 0 refills | Status: DC | PRN

## 2021-03-29 MED ORDER — APIXABAN 2.5 MG PO TABS
2.5000 mg | ORAL_TABLET | Freq: Two times a day (BID) | ORAL | 2 refills | Status: DC
Start: 2021-03-29 — End: 2021-06-14

## 2021-03-29 MED ORDER — AMIODARONE HCL 200 MG PO TABS: 200.0000 mg | ORAL_TABLET | Freq: Every day | ORAL | 1 refills | Status: DC

## 2021-03-29 NOTE — TOC Transition Note (Addendum)
Transition of Care [facility]) - CM/SW Discharge Note   Patient Details  Name: [name] MRN: [id number] Date of Birth: 26-Aug-1940  Transition of Care [facility]) CM/SW Contact:  Zenon Mayo, RN Phone Number: 03/29/2021, 10:26 AM   Clinical Narrative:    Patient is for dc today, she is set up with [facility] for [place].  She will have trasnportation home. Alvis Lemmings can provide the HHPT for patient. Patient states she does not need the [place].  So she will just receive the HHPT.    Final next level of care: [place] Barriers to Discharge: No Barriers Identified   Patient Goals and CMS Choice Patient states their goals for this hospitalization and ongoing recovery are:: home with [facility] CMS Medicare.gov Compare Post Acute Care list provided to:: Patient Choice offered to / list presented to : Patient  Discharge Placement                       Discharge Plan and Services In-house Referral: Clinical Social Work   Post Acute Care Choice: Newborn: PT [facility] Agency: [place] Date [place]: 03/29/21 Time [place]: 4975 Representative spoke with at [place]: [place] ([place]) Interventions     Readmission Risk Interventions No flowsheet data found.

## 2021-03-29 NOTE — Progress Notes (Signed)
      [address]       [place] [address]             [phone]       17 Days Post-Op Procedure(s) (LRB): XI ROBOTIC ASSISTED THORASCOPY-LEFT UPPER LOBE SEGMENTECTOMY (Left) INTERCOSTAL NERVE BLOCK (Left) LYMPH NODE DISSECTION (Left) THORACOTOMY MAJOR (Left)  Subjective:  No new complaints.  Objective: Vital signs in last 24 hours: Temp:  [97.6 F (36.4 C)-99.1 F (37.3 C)] 97.9 F (36.6 C) (11/02 0418) Pulse Rate:  [55-75] 55 (11/02 0418) Cardiac Rhythm: Normal sinus rhythm (11/01 1900) Resp:  [14-22] 19 (11/02 0418) BP: (113-132)/(45-50) 122/49 (11/02 0418) SpO2:  [93 %-97 %] 96 % (11/02 0418) Weight:  [57.3 kg] 57.3 kg (11/02 0418)  Intake/Output from previous day: 11/01 0701 - 11/02 0700 In: 840 [P.O.:840] Out: 1450 [Urine:1450]  General appearance: alert, cooperative, and no distress Heart: regular rate and rhythm Lungs: breath sounds are clear, good sats on RA. Abdomen: soft, non-tender Extremities:  no edema Wound: clean and dry, ecchymosis left flank  Lab Results: No results for input(s): WBC, HGB, HCT, PLT in the last 72 hours. BMET:  No results for input(s): NA, K, CL, CO2, GLUCOSE, BUN, CREATININE, CALCIUM in the last 72 hours.   PT/INR: No results for input(s): LABPROT, INR in the last 72 hours. ABG    Component Value Date/Time   PHART 7.418 03/10/2021 0541   HCO3 20.4 03/10/2021 0541   TCO2 21 (L) 03/10/2021 0541   ACIDBASEDEF 3.0 (H) 03/10/2021 0541   O2SAT 100.0 03/10/2021 0541   CBG (last 3)  Recent Labs    03/28/21 1644 03/28/21 2102 03/29/21 0626  GLUCAP 137* 167* 140*     Assessment/Plan: S/P Procedure(s) (LRB): XI ROBOTIC ASSISTED THORASCOPY-LEFT UPPER LOBE SEGMENTECTOMY (Left) INTERCOSTAL NERVE BLOCK (Left) LYMPH NODE DISSECTION (Left) THORACOTOMY MAJOR (Left)  CV- PAF early post-op, continues to maintain NSR on Amiodarone, Lopressor, Eliquis Pulm- stable, good sats on RA.continue IS DM- sugars  controlled Pancreatic Insufficiency-on Creon Deconditioning- strength and mobility have improved while waiting several days for insurance approval for inpatient rehab. PT and OT have upgraded recommendations for discharge to home with home health services. Dispo- will arrange for [facility] PT/OT, and plan for discharge tomorrow.   LOS: 20 days    Antony Odea, PA-C 03/29/2021  Patient seen and examined, agree with above Continues to make slow but steady progress Plan for dc home with PT once arrangements complete  Remo Lipps C. Roxan Hockey, MD [facility] [phone]

## 2021-03-29 NOTE — Progress Notes (Signed)
Physical Therapy Treatment Patient Details Name: [name] MRN: [id number] DOB: 11/14/1940 Today's Date: 03/29/2021   History of Present Illness Pt is a 80 y.o. female admitted 03/09/21 with L lung nodule for XI robotic-assisted thorascopy and LUL segmentectomy, L major thoracotomy. Chest tube removed 10/20; CXR shows increase L pleural effusion, increase in atelectasis. PMH includes osteopenia, DVT, GIB, whipple.    PT Comments    Pt supine in bed on entry, agreeable to working on stair training. With getting up with therapy pt found to be incontinent of stool. Pt unaware.  Pt is mod I for bed mobility, and supervision with transfers and ambulation. After seated rest break, pt reports she needs to urinate, and ambulated back to room instead of stair training. Pt denies needing to urinate with return to room. Pharmacist present to go over meds for discharge. PT will follow back with single step to practice stair climb prior to discharge home.    Recommendations for follow up therapy are one component of a multi-disciplinary discharge planning process, led by the attending physician.  Recommendations may be updated based on patient status, additional functional criteria and insurance authorization.  Follow Up Recommendations  Home health PT     Assistance Recommended at Discharge Intermittent Supervision/Assistance  Equipment Recommendations  Rollator (4 wheels)    Recommendations for Other Services       Precautions / Restrictions Precautions Precautions: Fall Precaution Comments: incontinent Restrictions Weight Bearing Restrictions: No     Mobility  Bed Mobility Overal bed mobility: Modified Independent Bed Mobility: Supine to Sit;Sit to Supine Rolling: Modified independent (Device/Increase time)   Supine to sit: Modified independent (Device/Increase time);HOB elevated     General bed mobility comments: no assist or difficulty    Transfers Overall transfer  level: Needs assistance Equipment used: Rollator (4 wheels) Transfers: Sit to/from Stand Sit to Stand: Supervision           General transfer comment: Verbal cues for breake application and hand placement    Ambulation/Gait Ambulation/Gait assistance: Supervision Gait Distance (Feet): 400 Feet Assistive device: Rollator (4 wheels) Gait Pattern/deviations: Step-through pattern;Decreased stride length Gait velocity: decr Gait velocity interpretation: <1.31 ft/sec, indicative of household ambulator General Gait Details: Slow, steady gait with rollator. 1x seated rest break, good use of breaks to sit, requires increased rest time                    Balance Overall balance assessment: Needs assistance   Sitting balance-Leahy Scale: Good     Standing balance support: No upper extremity supported Standing balance-Leahy Scale: Fair Standing balance comment: requires UE for dynamic activity                            Cognition Arousal/Alertness: Awake/alert Behavior During Therapy: WFL for tasks assessed/performed Overall Cognitive Status: Within Functional Limits for tasks assessed                                             General Comments General comments (skin integrity, edema, etc.): VSS on RA, 4/4 DoE with 200 feet ambulation requiring seated rest break      Pertinent Vitals/Pain Pain Assessment: Faces Faces Pain Scale: Hurts even more Pain Location: chest especially with bed mobility Pain Descriptors / Indicators: Grimacing;Guarding Pain Intervention(s): Limited activity within patient's tolerance;Monitored during  session;Repositioned     PT Goals (current goals can now be found in the care plan section) Acute Rehab PT Goals PT Goal Formulation: With patient Time For Goal Achievement: 04/12/21 Potential to Achieve Goals: Good Progress towards PT goals: Progressing toward goals    Frequency    Min 3X/week      PT  Plan Current plan remains appropriate       AM-PAC PT "6 Clicks" Mobility   Outcome Measure  Help needed turning from your back to your side while in a flat bed without using bedrails?: None Help needed moving from lying on your back to sitting on the side of a flat bed without using bedrails?: None Help needed moving to and from a bed to a chair (including a wheelchair)?: None Help needed standing up from a chair using your arms (e.g., wheelchair or bedside chair)?: None Help needed to walk in hospital room?: A Little Help needed climbing 3-5 steps with a railing? : A Little 6 Click Score: 22    End of Session   Activity Tolerance: Patient tolerated treatment well Patient left: in bed;with call bell/phone within reach Nurse Communication: Mobility status PT Visit Diagnosis: Unsteadiness on feet (R26.81);Muscle weakness (generalized) (M62.81);Difficulty in walking, not elsewhere classified (R26.2)     Time: 1025-8527 PT Time Calculation (min) (ACUTE ONLY): 38 min  Charges:  $Gait Training: 23-37 mins $Therapeutic Activity: 8-22 mins                     Cheris Tweten B. Migdalia Dk PT, DPT Acute Rehabilitation Services Pager [phone] Office [phone]    [place] 03/29/2021, 10:24 AM

## 2021-03-29 NOTE — Progress Notes (Addendum)
      [address]       [place] [address]             [phone]       17 Days Post-Op Procedure(s) (LRB): XI ROBOTIC ASSISTED THORASCOPY-LEFT UPPER LOBE SEGMENTECTOMY (Left) INTERCOSTAL NERVE BLOCK (Left) LYMPH NODE DISSECTION (Left) THORACOTOMY MAJOR (Left)  Subjective:  No new problems.  She would like to return home today and said her daughter is prepared to help her with the transition today.   Objective: Vital signs in last 24 hours: Temp:  [97.6 F (36.4 C)-99.1 F (37.3 C)] 97.9 F (36.6 C) (11/02 0418) Pulse Rate:  [55-75] 55 (11/02 0418) Cardiac Rhythm: Normal sinus rhythm (11/01 1900) Resp:  [14-22] 19 (11/02 0418) BP: (113-132)/(45-50) 122/49 (11/02 0418) SpO2:  [93 %-97 %] 96 % (11/02 0418) Weight:  [57.3 kg] 57.3 kg (11/02 0418)  Intake/Output from previous day: 11/01 0701 - 11/02 0700 In: 840 [P.O.:840] Out: 1450 [Urine:1450]  General appearance: alert, cooperative, and no distress Heart: regular rate and rhythm Lungs: breath sounds are clear, good sats on RA. Abdomen: soft, non-tender Extremities:  no edema Wound: clean and dry, ecchymosis left flank gradually clearing  Lab Results: No results for input(s): WBC, HGB, HCT, PLT in the last 72 hours. BMET:  No results for input(s): NA, K, CL, CO2, GLUCOSE, BUN, CREATININE, CALCIUM in the last 72 hours.   PT/INR: No results for input(s): LABPROT, INR in the last 72 hours. ABG    Component Value Date/Time   PHART 7.418 03/10/2021 0541   HCO3 20.4 03/10/2021 0541   TCO2 21 (L) 03/10/2021 0541   ACIDBASEDEF 3.0 (H) 03/10/2021 0541   O2SAT 100.0 03/10/2021 0541   CBG (last 3)  Recent Labs    03/28/21 1644 03/28/21 2102 03/29/21 0626  GLUCAP 137* 167* 140*     Assessment/Plan: S/P Procedure(s) (LRB): XI ROBOTIC ASSISTED THORASCOPY-LEFT UPPER LOBE SEGMENTECTOMY (Left) INTERCOSTAL NERVE BLOCK (Left) LYMPH NODE DISSECTION (Left) THORACOTOMY MAJOR (Left)  CV- PAF early  post-op, continues to maintain NSR on Amiodarone, Lopressor, Eliquis. Check CBC this AM since Eliquis recently started.  Pulm- stable, good sats on RA DM- glucose controlled Pancreatic Insufficiency-on Creon Deconditioning- strength and mobility gradually improving.  PT and OT now recommend discharge to home with home health services. Dispo- plan discharge later today with  HH PT/OT, BSC, and rolling walker.    LOS: 20 days    Antony Odea, [place] [phone] 03/29/2021  Patient seen and examined, agree with above Home today with home PT, OT  Raizy Auzenne C. Roxan Hockey, MD [facility] [phone]

## 2021-03-29 NOTE — Progress Notes (Signed)
Suture to left lat back post chest tube site removed, tolerated well. Discharged home accompanied by daughter. Belongings taken home.

## 2021-03-29 NOTE — Progress Notes (Signed)
Occupational Therapy Treatment Patient Details Name: MATILDE POTTENGER MRN: [id number] DOB: April 15, 1941 Today's Date: 03/29/2021   History of present illness Pt is a 80 y.o. female admitted 03/09/21 with L lung nodule for XI robotic-assisted thorascopy and LUL segmentectomy, L major thoracotomy. Chest tube removed 10/20; CXR shows increase L pleural effusion, increase in atelectasis. PMH includes osteopenia, DVT, GIB, whipple.   OT comments  Ameri is progressing towards her acute goals with plans to d/c home today with support of friends and family. Pt completed ADLs in preparation for home with supervision - min A, pt continued to be limited by poor activity tolerance and generalized weakness. Pt required assist for bathing tasks in standing. Pt verbalized understanding of home safety and compensatory techniques to reduce fall risk during ADLs, as well as energy conservation techniques. Pt continues to benefit from OT acutely should her length of stay continue.    Recommendations for follow up therapy are one component of a multi-disciplinary discharge planning process, led by the attending physician.  Recommendations may be updated based on patient status, additional functional criteria and insurance authorization.    Follow Up Recommendations  Home health OT    Assistance Recommended at Discharge Frequent or constant Supervision/Assistance  Equipment Recommendations  BSC;Tub/shower seat       Precautions / Restrictions Precautions Precautions: Fall Precaution Comments: incontinent Restrictions Weight Bearing Restrictions: No       Mobility Bed Mobility Overal bed mobility: Modified Independent Bed Mobility: Supine to Sit;Sit to Supine Rolling: Modified independent (Device/Increase time)   Supine to sit: Modified independent (Device/Increase time);HOB elevated     General bed mobility comments: slow and deliberate, no physical assist needed    Transfers Overall transfer  level: Needs assistance Equipment used: Rollator (4 wheels) Transfers: Sit to/from Stand Sit to Stand: Supervision           General transfer comment: Pt wanted to rest in bed at end of session in preparation to go home     Balance Overall balance assessment: Needs assistance   Sitting balance-Leahy Scale: Good     Standing balance support: No upper extremity supported Standing balance-Leahy Scale: Fair Standing balance comment: requires UE for dynamic activity                           ADL either performed or assessed with clinical judgement   ADL Overall ADL's : Needs assistance/impaired             Lower Body Bathing: Minimal assistance;Sit to/from stand Lower Body Bathing Details (indicate cue type and reason): min A for bathing rear peri area in standing with pt stating "I dont think i can reach back there" Upper Body Dressing : Set up;Sitting   Lower Body Dressing: Minimal assistance;Sit to/from stand Lower Body Dressing Details (indicate cue type and reason): min A to thread RLE, no AD for standing             Functional mobility during ADLs: Min guard General ADL Comments: Pt with poor activity tolerance this session adn anxious about going home     Vision   Vision Assessment?: No apparent visual deficits          Cognition Arousal/Alertness: Awake/alert Behavior During Therapy: WFL for tasks assessed/performed Overall Cognitive Status: Within Functional Limits for tasks assessed         General Comments: "been put through the ringer today" per pt, seemingly anxious about going home today  General Comments HR 53-56 throughout session, monitor stated SpO2 was low hoever likely die to poor placement of probe. RN ntoified    Pertinent Vitals/ Pain       Pain Assessment: Faces Faces Pain Scale: Hurts a little bit Pain Location: generalized with movement Pain Descriptors / Indicators: Grimacing;Guarding Pain  Intervention(s): Limited activity within patient's tolerance;Monitored during session   Progress Toward Goals  OT Goals(current goals can now be found in the care plan section)     Acute Rehab OT Goals OT Goal Formulation: With patient Time For Goal Achievement: 04/04/21 Potential to Achieve Goals: Fair ADL Goals Pt Will Perform Grooming: with modified independence;standing Pt Will Perform Lower Body Bathing: with modified independence;sit to/from stand Pt Will Perform Lower Body Dressing: with modified independence;sit to/from stand Pt Will Transfer to Toilet: with modified independence;ambulating;bedside commode Pt Will Perform Toileting - Clothing Manipulation and hygiene: with modified independence;sit to/from stand Additional ADL Goal #1: Pt will generalize energy conservation and breathing techniques in ADL.  Plan Discharge plan remains appropriate       AM-PAC OT "6 Clicks" Daily Activity     Outcome Measure   Help from another person eating meals?: None Help from another person taking care of personal grooming?: A Little Help from another person toileting, which includes using toliet, bedpan, or urinal?: A Little Help from another person bathing (including washing, rinsing, drying)?: A Little Help from another person to put on and taking off regular upper body clothing?: None Help from another person to put on and taking off regular lower body clothing?: A Little 6 Click Score: 20    End of Session    OT Visit Diagnosis: Unsteadiness on feet (R26.81);Other abnormalities of gait and mobility (R26.89);Pain   Activity Tolerance Patient tolerated treatment well   Patient Left in bed;with call bell/phone within reach (set up for lunch)   Nurse Communication Mobility status        Time: 6415-8309 OT Time Calculation (min): 19 min  Charges: OT General Charges $OT Visit: 1 Visit OT Treatments $Self Care/Home Management : 8-22 mins  Cormick Moss A Jazmine Longshore 03/29/2021,  12:20 PM

## 2021-03-29 NOTE — Progress Notes (Signed)
Physical Therapy Treatment Patient Details Name: [name] MRN: [id number] DOB: 1941-03-27 Today's Date: 03/29/2021   History of Present Illness Pt is a 80 y.o. female admitted 03/09/21 with L lung nodule for XI robotic-assisted thorascopy and LUL segmentectomy, L major thoracotomy. Chest tube removed 10/20; CXR shows increase L pleural effusion, increase in atelectasis. PMH includes osteopenia, DVT, GIB, whipple.    PT Comments    Pt agreeable to step training, pt is supervision for transfers and 2 feet ambulation to RW over single step. Pt able to step up and back x5. Pt indicates that she is apprehensive about going home but is glad to have done step training and feels more confident about going home. Discussed with pt need for activity every hour to build her endurance. Pt agreeable. D/c plans remain appropriate.     Recommendations for follow up therapy are one component of a multi-disciplinary discharge planning process, led by the attending physician.  Recommendations may be updated based on patient status, additional functional criteria and insurance authorization.  Follow Up Recommendations  Home health PT     Assistance Recommended at Discharge Intermittent Supervision/Assistance  Equipment Recommendations  Rollator (4 wheels)       Precautions / Restrictions Precautions Precautions: Fall Precaution Comments: incontinent Restrictions Weight Bearing Restrictions: No     Mobility  Bed Mobility Overal bed mobility: Modified Independent Bed Mobility: Supine to Sit;Sit to Supine Rolling: Modified independent (Device/Increase time)   Supine to sit: Modified independent (Device/Increase time);HOB elevated     General bed mobility comments: OOB in recliner    Transfers Overall transfer level: Needs assistance Equipment used: Rollator (4 wheels) Transfers: Sit to/from Stand Sit to Stand: Supervision           General transfer comment: able to power up  and self steady before reaching to RW over step    Ambulation/Gait Ambulation/Gait assistance: Supervision Gait Distance (Feet): 2 Feet Assistive device: None Gait Pattern/deviations: Step-through pattern;Decreased stride length Gait velocity: decr Gait velocity interpretation: <1.31 ft/sec, indicative of household ambulator General Gait Details: mildly unsteady steps 2 feet forward and back to RW over step   Stairs Stairs: Yes Stairs assistance: Min guard Stair Management: Two rails;Forwards;Backwards Number of Stairs: 1 (x5) General stair comments: pt able to step up and back from one step x5, pt states feeling better about having strength to get into her house.       Balance Overall balance assessment: Needs assistance   Sitting balance-Leahy Scale: Good     Standing balance support: No upper extremity supported Standing balance-Leahy Scale: Fair Standing balance comment: requires UE for dynamic activity                            Cognition Arousal/Alertness: Awake/alert Behavior During Therapy: WFL for tasks assessed/performed Overall Cognitive Status: Within Functional Limits for tasks assessed                                             General Comments General comments (skin integrity, edema, etc.): HR 46-52 during session, RN reports bradycardia normal after morining meds      Pertinent Vitals/Pain Pain Assessment: Faces Faces Pain Scale: Hurts a little bit Pain Location: with movement Pain Descriptors / Indicators: Grimacing;Guarding Pain Intervention(s): Limited activity within patient's tolerance;Monitored during session;Repositioned     PT  Goals (current goals can now be found in the care plan section) Acute Rehab PT Goals PT Goal Formulation: With patient Time For Goal Achievement: 04/10/21 Potential to Achieve Goals: Good Progress towards PT goals: Progressing toward goals    Frequency    Min 3X/week       PT Plan Current plan remains appropriate       AM-PAC PT "6 Clicks" Mobility   Outcome Measure  Help needed turning from your back to your side while in a flat bed without using bedrails?: None Help needed moving from lying on your back to sitting on the side of a flat bed without using bedrails?: None Help needed moving to and from a bed to a chair (including a wheelchair)?: None Help needed standing up from a chair using your arms (e.g., wheelchair or bedside chair)?: None Help needed to walk in hospital room?: A Little Help needed climbing 3-5 steps with a railing? : A Little 6 Click Score: 22    End of Session   Activity Tolerance: Patient tolerated treatment well Patient left: in chair;with chair alarm set;with call bell/phone within reach Nurse Communication: Mobility status PT Visit Diagnosis: Unsteadiness on feet (R26.81);Muscle weakness (generalized) (M62.81);Difficulty in walking, not elsewhere classified (R26.2)     Time: 5750-5183 PT Time Calculation (min) (ACUTE ONLY): 8 min  Charges:  $Gait Training: 8-22 mins $Therapeutic Activity: 8-22 mins                     Kaleigh Spiegelman B. Migdalia Dk PT, DPT Acute Rehabilitation Services Pager [phone] Office [phone]    [place] 03/29/2021, 11:02 AM

## 2021-04-01 DIAGNOSIS — E119 Type 2 diabetes mellitus without complications: Secondary | ICD-10-CM | POA: Diagnosis not present

## 2021-04-01 DIAGNOSIS — G4700 Insomnia, unspecified: Secondary | ICD-10-CM | POA: Diagnosis not present

## 2021-04-01 DIAGNOSIS — Z483 Aftercare following surgery for neoplasm: Secondary | ICD-10-CM | POA: Diagnosis not present

## 2021-04-01 DIAGNOSIS — Z9181 History of falling: Secondary | ICD-10-CM | POA: Diagnosis not present

## 2021-04-01 DIAGNOSIS — I700 Atherosclerosis of aorta: Secondary | ICD-10-CM | POA: Diagnosis not present

## 2021-04-01 DIAGNOSIS — Z7982 Long term (current) use of aspirin: Secondary | ICD-10-CM | POA: Diagnosis not present

## 2021-04-01 DIAGNOSIS — D509 Iron deficiency anemia, unspecified: Secondary | ICD-10-CM | POA: Diagnosis not present

## 2021-04-01 DIAGNOSIS — Z7984 Long term (current) use of oral hypoglycemic drugs: Secondary | ICD-10-CM | POA: Diagnosis not present

## 2021-04-01 DIAGNOSIS — Z7901 Long term (current) use of anticoagulants: Secondary | ICD-10-CM | POA: Diagnosis not present

## 2021-04-01 DIAGNOSIS — M47815 Spondylosis without myelopathy or radiculopathy, thoracolumbar region: Secondary | ICD-10-CM | POA: Diagnosis not present

## 2021-04-01 DIAGNOSIS — D630 Anemia in neoplastic disease: Secondary | ICD-10-CM | POA: Diagnosis not present

## 2021-04-01 DIAGNOSIS — Z86718 Personal history of other venous thrombosis and embolism: Secondary | ICD-10-CM | POA: Diagnosis not present

## 2021-04-01 DIAGNOSIS — I119 Hypertensive heart disease without heart failure: Secondary | ICD-10-CM | POA: Diagnosis not present

## 2021-04-01 DIAGNOSIS — J9811 Atelectasis: Secondary | ICD-10-CM | POA: Diagnosis not present

## 2021-04-01 DIAGNOSIS — I2510 Atherosclerotic heart disease of native coronary artery without angina pectoris: Secondary | ICD-10-CM | POA: Diagnosis not present

## 2021-04-01 DIAGNOSIS — I482 Chronic atrial fibrillation, unspecified: Secondary | ICD-10-CM | POA: Diagnosis not present

## 2021-04-01 DIAGNOSIS — Z8542 Personal history of malignant neoplasm of other parts of uterus: Secondary | ICD-10-CM | POA: Diagnosis not present

## 2021-04-01 DIAGNOSIS — Z902 Acquired absence of lung [part of]: Secondary | ICD-10-CM | POA: Diagnosis not present

## 2021-04-01 DIAGNOSIS — E7800 Pure hypercholesterolemia, unspecified: Secondary | ICD-10-CM | POA: Diagnosis not present

## 2021-04-01 DIAGNOSIS — C3412 Malignant neoplasm of upper lobe, left bronchus or lung: Secondary | ICD-10-CM | POA: Diagnosis not present

## 2021-04-01 DIAGNOSIS — M8580 Other specified disorders of bone density and structure, unspecified site: Secondary | ICD-10-CM | POA: Diagnosis not present

## 2021-04-04 DIAGNOSIS — C541 Malignant neoplasm of endometrium: Secondary | ICD-10-CM | POA: Diagnosis not present

## 2021-04-04 DIAGNOSIS — E1169 Type 2 diabetes mellitus with other specified complication: Secondary | ICD-10-CM | POA: Diagnosis not present

## 2021-04-04 DIAGNOSIS — M810 Age-related osteoporosis without current pathological fracture: Secondary | ICD-10-CM | POA: Diagnosis not present

## 2021-04-04 DIAGNOSIS — Z8542 Personal history of malignant neoplasm of other parts of uterus: Secondary | ICD-10-CM | POA: Diagnosis not present

## 2021-04-04 DIAGNOSIS — E785 Hyperlipidemia, unspecified: Secondary | ICD-10-CM | POA: Diagnosis not present

## 2021-04-04 DIAGNOSIS — I2510 Atherosclerotic heart disease of native coronary artery without angina pectoris: Secondary | ICD-10-CM | POA: Diagnosis not present

## 2021-04-04 DIAGNOSIS — G4700 Insomnia, unspecified: Secondary | ICD-10-CM | POA: Diagnosis not present

## 2021-04-04 DIAGNOSIS — E119 Type 2 diabetes mellitus without complications: Secondary | ICD-10-CM | POA: Diagnosis not present

## 2021-04-04 DIAGNOSIS — E1165 Type 2 diabetes mellitus with hyperglycemia: Secondary | ICD-10-CM | POA: Diagnosis not present

## 2021-04-04 DIAGNOSIS — D509 Iron deficiency anemia, unspecified: Secondary | ICD-10-CM | POA: Diagnosis not present

## 2021-04-10 DIAGNOSIS — I119 Hypertensive heart disease without heart failure: Secondary | ICD-10-CM | POA: Diagnosis not present

## 2021-04-10 DIAGNOSIS — I700 Atherosclerosis of aorta: Secondary | ICD-10-CM | POA: Diagnosis not present

## 2021-04-10 DIAGNOSIS — Z7901 Long term (current) use of anticoagulants: Secondary | ICD-10-CM | POA: Diagnosis not present

## 2021-04-10 DIAGNOSIS — E119 Type 2 diabetes mellitus without complications: Secondary | ICD-10-CM | POA: Diagnosis not present

## 2021-04-10 DIAGNOSIS — I482 Chronic atrial fibrillation, unspecified: Secondary | ICD-10-CM | POA: Diagnosis not present

## 2021-04-10 DIAGNOSIS — Z9181 History of falling: Secondary | ICD-10-CM | POA: Diagnosis not present

## 2021-04-10 DIAGNOSIS — Z7984 Long term (current) use of oral hypoglycemic drugs: Secondary | ICD-10-CM | POA: Diagnosis not present

## 2021-04-10 DIAGNOSIS — J9811 Atelectasis: Secondary | ICD-10-CM | POA: Diagnosis not present

## 2021-04-10 DIAGNOSIS — G4700 Insomnia, unspecified: Secondary | ICD-10-CM | POA: Diagnosis not present

## 2021-04-10 DIAGNOSIS — D630 Anemia in neoplastic disease: Secondary | ICD-10-CM | POA: Diagnosis not present

## 2021-04-10 DIAGNOSIS — M8580 Other specified disorders of bone density and structure, unspecified site: Secondary | ICD-10-CM | POA: Diagnosis not present

## 2021-04-10 DIAGNOSIS — I2510 Atherosclerotic heart disease of native coronary artery without angina pectoris: Secondary | ICD-10-CM | POA: Diagnosis not present

## 2021-04-10 DIAGNOSIS — C3412 Malignant neoplasm of upper lobe, left bronchus or lung: Secondary | ICD-10-CM | POA: Diagnosis not present

## 2021-04-10 DIAGNOSIS — Z8542 Personal history of malignant neoplasm of other parts of uterus: Secondary | ICD-10-CM | POA: Diagnosis not present

## 2021-04-10 DIAGNOSIS — Z86718 Personal history of other venous thrombosis and embolism: Secondary | ICD-10-CM | POA: Diagnosis not present

## 2021-04-10 DIAGNOSIS — D509 Iron deficiency anemia, unspecified: Secondary | ICD-10-CM | POA: Diagnosis not present

## 2021-04-10 DIAGNOSIS — Z902 Acquired absence of lung [part of]: Secondary | ICD-10-CM | POA: Diagnosis not present

## 2021-04-10 DIAGNOSIS — Z7982 Long term (current) use of aspirin: Secondary | ICD-10-CM | POA: Diagnosis not present

## 2021-04-10 DIAGNOSIS — Z483 Aftercare following surgery for neoplasm: Secondary | ICD-10-CM | POA: Diagnosis not present

## 2021-04-10 DIAGNOSIS — E7800 Pure hypercholesterolemia, unspecified: Secondary | ICD-10-CM | POA: Diagnosis not present

## 2021-04-10 DIAGNOSIS — M47815 Spondylosis without myelopathy or radiculopathy, thoracolumbar region: Secondary | ICD-10-CM | POA: Diagnosis not present

## 2021-04-17 ENCOUNTER — Other Ambulatory Visit: Payer: Self-pay | Admitting: Thoracic Surgery (Cardiothoracic Vascular Surgery)

## 2021-04-17 DIAGNOSIS — C3490 Malignant neoplasm of unspecified part of unspecified bronchus or lung: Secondary | ICD-10-CM

## 2021-04-18 ENCOUNTER — Ambulatory Visit: Payer: Self-pay | Admitting: Thoracic Surgery (Cardiothoracic Vascular Surgery)

## 2021-04-27 ENCOUNTER — Telehealth: Payer: Self-pay | Admitting: *Deleted

## 2021-04-27 NOTE — Telephone Encounter (Signed)
Spoke with the patient to reschedule her appt for 12/6; patient sated "did you all know I had major surgery with complications. I had lung nodes and had a left upper lobe segmentectomy and they nicked my pulmonary artery and I all out bleed out. I was in the hospital from 10/13 to 11/2. I am still very weak and recovering." Explained that the message will placed in her chart and to the office once she recovers and feels up to the appt.

## 2021-05-01 NOTE — Progress Notes (Signed)
[address]       [place] [address]             [phone]       HPI: This is a 80 year old female with a past medical history of history of endometrial cancer, adenocarcinoma the vaginal cuff, DVT after surgery, malignant gastrointestinal stromal tumor, Whipple procedure, type 2 diabetes, multiple basal cell carcinomas, coronary atherosclerosis, hypertension, hyperlipidemia, iron deficiency anemia, arthritis, and reflux. She had a PET/CT in June which showed a new 9 x 12 millimeter left upper lobe lung nodule, which was hypermetabolic with an SUV of 5.  There was no evidence of a lesion in that area on her prior PET from January.  She was in [place] visiting her sister and recently returned to [place].  She had a CT of the chest which showed the nodule had increased in size. She underwent a Xi robotic assisted left upper lobe segmentectomy, lymph node dissection, nerve block and left thoracotomy for control of bleeding by Dr. Roxan Hockey on 03/09/2021. Post op, she had paroxysmal atrial fibrillation. She was treated with Lopressor, Amiodarone, and Apixaban. Patient states PT/OT still coming to her home. She is very deconditioned but is able to participate. She has complaints of cough with light yellow sputum production and difficulty taking a deep breath.  Current Outpatient Medications  Medication Sig Dispense Refill   amiodarone (PACERONE) 200 MG tablet Take 1 tablet (200 mg total) by mouth daily. 60 tablet 1   apixaban (ELIQUIS) 2.5 MG TABS tablet Take 1 tablet (2.5 mg total) by mouth 2 (two) times daily. 60 tablet 2   Apoaequorin (PREVAGEN PO) Take 1 tablet by mouth daily.     Ascorbic Acid (VITAMIN C) 500 MG CAPS Take 500 mg by mouth daily.     aspirin 81 MG chewable tablet Chew 1 tablet by mouth daily.     Cholecalciferol (VITAMIN D) 50 MCG (2000 UT) tablet Take 2,000 Units by mouth daily.     Coenzyme Q10 (COQ-10) 100 MG CAPS Take 100 mg by mouth at  bedtime.     CREON 12000-38000 units CPEP capsule Take 12,000 Units by mouth 3 (three) times daily before meals.     empagliflozin (JARDIANCE) 10 MG TABS tablet Take 10 mg by mouth daily.     Melatonin 10 MG TABS Take 10 mg by mouth at bedtime as needed (sleep).     metoprolol tartrate (LOPRESSOR) 50 MG tablet Take 25 mg by mouth 2 (two) times daily.     ONETOUCH VERIO test strip daily.     Probiotic Product (ALIGN) 4 MG CAPS Take 4 mg by mouth daily.     simvastatin (ZOCOR) 20 MG tablet Take 20 mg by mouth at bedtime.     sucralfate (CARAFATE) 1 g tablet Take 1 g by mouth 2 (two) times daily.     traMADol (ULTRAM) 50 MG tablet Take 1 tablet (50 mg total) by mouth every 6 (six) hours as needed (mild pain). 20 tablet 0   vitamin E 180 MG (400 UNITS) capsule Take 400 Units by mouth daily.     zinc gluconate 50 MG tablet Take 50 mg by mouth daily.    Vital Signs: Vitals:   05/02/21 1527  BP: (!) 145/74  Pulse: 69  Resp: 20  SpO2: 94%     Physical Exam: CV-RRR Pulmonary-Slightly diminished left basilar breath sounds;right lung is clear Wounds-Clean and dry. No sign of infection  Diagnostic  Tests: Narrative & Impression  CLINICAL DATA:  Lung cancer.  Shortness of breath.   EXAM: CHEST - 2 VIEW   COMPARISON:  03/24/2021   FINDINGS: Postoperative changes on the left with volume loss. Left basilar scarring or atelectasis. Right lung clear. Heart is normal size. Possible small left effusion. No acute bony abnormality.   IMPRESSION: Postoperative changes on the left with left base atelectasis or scarring and possible small left effusion.      Impression and Plan: Overall, [name] is continuing to recover from lung surgery. Dr. Roxan Hockey evaluated the patient with me on today's visit. He had a long discussion with her that it will likely be several more weeks of recovery but hopefully, will get better each week. Patient states she stopped taking Lopressor at night and  does not want to take it anymore. She has also stopped taking Metformin and Simvastatin. She will discuss with PCP about stopping the last 2 medications. Dr. Roxan Hockey discussed continuing to take Amiodarone and Apixaban for now and hope to stop in January. We will obtain a CBC (to see if she has anemia) and I will call her with results. She will be referred to Dr. Berline Lopes (as her gyn oncologist is now at [facility]) as pathology from lung was consistent with metastatic adenocarcinoma (she has a history of endometrial cancer). A PA/LAT CXR and follow up with Dr. Roxan Hockey will be arranged for January.    Carol Skillern, PA-C [facility] [phone]

## 2021-05-02 ENCOUNTER — Other Ambulatory Visit: Payer: Self-pay | Admitting: *Deleted

## 2021-05-02 ENCOUNTER — Ambulatory Visit: Payer: PPO | Admitting: Gynecologic Oncology

## 2021-05-02 ENCOUNTER — Ambulatory Visit
Admission: RE | Admit: 2021-05-02 | Discharge: 2021-05-02 | Disposition: A | Discharge status: Home / Self Care | Payer: PPO | Source: Ambulatory Visit | Attending: Thoracic Surgery (Cardiothoracic Vascular Surgery) | Admitting: Thoracic Surgery (Cardiothoracic Vascular Surgery)

## 2021-05-02 ENCOUNTER — Ambulatory Visit (INDEPENDENT_AMBULATORY_CARE_PROVIDER_SITE_OTHER): Payer: Self-pay | Admitting: Physician Assistant

## 2021-05-02 ENCOUNTER — Other Ambulatory Visit: Payer: Self-pay

## 2021-05-02 VITALS — BP 145/74 | HR 69 | Resp 20 | Ht 65.0 in | Wt 109.0 lb

## 2021-05-02 DIAGNOSIS — C3490 Malignant neoplasm of unspecified part of unspecified bronchus or lung: Secondary | ICD-10-CM

## 2021-05-02 DIAGNOSIS — Z9889 Other specified postprocedural states: Secondary | ICD-10-CM

## 2021-05-02 DIAGNOSIS — R0602 Shortness of breath: Secondary | ICD-10-CM | POA: Diagnosis not present

## 2021-05-02 LAB — CBC WITH DIFFERENTIAL/PLATELET
Absolute Monocytes: 708 cells/uL (ref 200–950)
Basophils Absolute: 37 cells/uL (ref 0–200)
Basophils Relative: 0.4 %
Eosinophils Absolute: 92 cells/uL (ref 15–500)
Eosinophils Relative: 1 %
HCT: 42.9 % (ref 35.0–45.0)
Hemoglobin: 13.6 g/dL (ref 11.7–15.5)
Lymphs Abs: 1389 cells/uL (ref 850–3900)
MCH: 28.5 pg (ref 27.0–33.0)
MCHC: 31.7 g/dL — ABNORMAL LOW (ref 32.0–36.0)
MCV: 89.9 fL (ref 80.0–100.0)
MPV: 8.8 fL (ref 7.5–12.5)
Monocytes Relative: 7.7 %
Neutro Abs: 6974 cells/uL (ref 1500–7800)
Neutrophils Relative %: 75.8 %
Platelets: 343 10*3/uL (ref 140–400)
RBC: 4.77 10*6/uL (ref 3.80–5.10)
RDW: 16.2 % — ABNORMAL HIGH (ref 11.0–15.0)
Total Lymphocyte: 15.1 %
WBC: 9.2 10*3/uL (ref 3.8–10.8)

## 2021-05-03 DIAGNOSIS — D509 Iron deficiency anemia, unspecified: Secondary | ICD-10-CM | POA: Diagnosis not present

## 2021-05-03 DIAGNOSIS — D630 Anemia in neoplastic disease: Secondary | ICD-10-CM | POA: Diagnosis not present

## 2021-05-03 DIAGNOSIS — M47815 Spondylosis without myelopathy or radiculopathy, thoracolumbar region: Secondary | ICD-10-CM | POA: Diagnosis not present

## 2021-05-03 DIAGNOSIS — Z86718 Personal history of other venous thrombosis and embolism: Secondary | ICD-10-CM | POA: Diagnosis not present

## 2021-05-03 DIAGNOSIS — J9811 Atelectasis: Secondary | ICD-10-CM | POA: Diagnosis not present

## 2021-05-03 DIAGNOSIS — I482 Chronic atrial fibrillation, unspecified: Secondary | ICD-10-CM | POA: Diagnosis not present

## 2021-05-03 DIAGNOSIS — Z7984 Long term (current) use of oral hypoglycemic drugs: Secondary | ICD-10-CM | POA: Diagnosis not present

## 2021-05-03 DIAGNOSIS — M8580 Other specified disorders of bone density and structure, unspecified site: Secondary | ICD-10-CM | POA: Diagnosis not present

## 2021-05-03 DIAGNOSIS — I2510 Atherosclerotic heart disease of native coronary artery without angina pectoris: Secondary | ICD-10-CM | POA: Diagnosis not present

## 2021-05-03 DIAGNOSIS — Z7982 Long term (current) use of aspirin: Secondary | ICD-10-CM | POA: Diagnosis not present

## 2021-05-03 DIAGNOSIS — Z8542 Personal history of malignant neoplasm of other parts of uterus: Secondary | ICD-10-CM | POA: Diagnosis not present

## 2021-05-03 DIAGNOSIS — Z902 Acquired absence of lung [part of]: Secondary | ICD-10-CM | POA: Diagnosis not present

## 2021-05-03 DIAGNOSIS — E119 Type 2 diabetes mellitus without complications: Secondary | ICD-10-CM | POA: Diagnosis not present

## 2021-05-03 DIAGNOSIS — E7800 Pure hypercholesterolemia, unspecified: Secondary | ICD-10-CM | POA: Diagnosis not present

## 2021-05-03 DIAGNOSIS — I700 Atherosclerosis of aorta: Secondary | ICD-10-CM | POA: Diagnosis not present

## 2021-05-03 DIAGNOSIS — C3412 Malignant neoplasm of upper lobe, left bronchus or lung: Secondary | ICD-10-CM | POA: Diagnosis not present

## 2021-05-03 DIAGNOSIS — Z483 Aftercare following surgery for neoplasm: Secondary | ICD-10-CM | POA: Diagnosis not present

## 2021-05-03 DIAGNOSIS — Z9181 History of falling: Secondary | ICD-10-CM | POA: Diagnosis not present

## 2021-05-03 DIAGNOSIS — Z7901 Long term (current) use of anticoagulants: Secondary | ICD-10-CM | POA: Diagnosis not present

## 2021-05-03 DIAGNOSIS — I119 Hypertensive heart disease without heart failure: Secondary | ICD-10-CM | POA: Diagnosis not present

## 2021-05-03 DIAGNOSIS — G4700 Insomnia, unspecified: Secondary | ICD-10-CM | POA: Diagnosis not present

## 2021-05-04 DIAGNOSIS — M810 Age-related osteoporosis without current pathological fracture: Secondary | ICD-10-CM | POA: Diagnosis not present

## 2021-05-04 DIAGNOSIS — Z8542 Personal history of malignant neoplasm of other parts of uterus: Secondary | ICD-10-CM | POA: Diagnosis not present

## 2021-05-04 DIAGNOSIS — E119 Type 2 diabetes mellitus without complications: Secondary | ICD-10-CM | POA: Diagnosis not present

## 2021-05-04 DIAGNOSIS — E785 Hyperlipidemia, unspecified: Secondary | ICD-10-CM | POA: Diagnosis not present

## 2021-05-04 DIAGNOSIS — G4700 Insomnia, unspecified: Secondary | ICD-10-CM | POA: Diagnosis not present

## 2021-05-04 DIAGNOSIS — D509 Iron deficiency anemia, unspecified: Secondary | ICD-10-CM | POA: Diagnosis not present

## 2021-05-04 DIAGNOSIS — E1165 Type 2 diabetes mellitus with hyperglycemia: Secondary | ICD-10-CM | POA: Diagnosis not present

## 2021-05-04 DIAGNOSIS — E1169 Type 2 diabetes mellitus with other specified complication: Secondary | ICD-10-CM | POA: Diagnosis not present

## 2021-05-04 DIAGNOSIS — I2510 Atherosclerotic heart disease of native coronary artery without angina pectoris: Secondary | ICD-10-CM | POA: Diagnosis not present

## 2021-05-04 DIAGNOSIS — C541 Malignant neoplasm of endometrium: Secondary | ICD-10-CM | POA: Diagnosis not present

## 2021-05-05 ENCOUNTER — Telehealth: Payer: Self-pay | Admitting: *Deleted

## 2021-05-05 NOTE — Telephone Encounter (Signed)
Patient stated that "I'm still recovering from the surgery and want to wait until the new year."

## 2021-05-08 DIAGNOSIS — R0602 Shortness of breath: Secondary | ICD-10-CM | POA: Diagnosis not present

## 2021-05-08 DIAGNOSIS — C49A0 Gastrointestinal stromal tumor, unspecified site: Secondary | ICD-10-CM | POA: Diagnosis not present

## 2021-05-08 DIAGNOSIS — J159 Unspecified bacterial pneumonia: Secondary | ICD-10-CM | POA: Diagnosis not present

## 2021-05-08 DIAGNOSIS — J210 Acute bronchiolitis due to respiratory syncytial virus: Secondary | ICD-10-CM | POA: Diagnosis not present

## 2021-05-08 DIAGNOSIS — J121 Respiratory syncytial virus pneumonia: Secondary | ICD-10-CM | POA: Diagnosis not present

## 2021-05-08 DIAGNOSIS — B974 Respiratory syncytial virus as the cause of diseases classified elsewhere: Secondary | ICD-10-CM | POA: Diagnosis not present

## 2021-05-08 DIAGNOSIS — E872 Acidosis, unspecified: Secondary | ICD-10-CM | POA: Diagnosis not present

## 2021-05-08 DIAGNOSIS — C3412 Malignant neoplasm of upper lobe, left bronchus or lung: Secondary | ICD-10-CM | POA: Diagnosis not present

## 2021-05-08 DIAGNOSIS — Z833 Family history of diabetes mellitus: Secondary | ICD-10-CM | POA: Diagnosis not present

## 2021-05-08 DIAGNOSIS — Z7984 Long term (current) use of oral hypoglycemic drugs: Secondary | ICD-10-CM | POA: Diagnosis not present

## 2021-05-08 DIAGNOSIS — Z20822 Contact with and (suspected) exposure to covid-19: Secondary | ICD-10-CM | POA: Diagnosis not present

## 2021-05-08 DIAGNOSIS — Z90411 Acquired partial absence of pancreas: Secondary | ICD-10-CM | POA: Diagnosis not present

## 2021-05-08 DIAGNOSIS — E8729 Other acidosis: Secondary | ICD-10-CM | POA: Diagnosis not present

## 2021-05-08 DIAGNOSIS — G4700 Insomnia, unspecified: Secondary | ICD-10-CM | POA: Diagnosis not present

## 2021-05-08 DIAGNOSIS — Z681 Body mass index (BMI) 19 or less, adult: Secondary | ICD-10-CM | POA: Diagnosis not present

## 2021-05-08 DIAGNOSIS — E560 Deficiency of vitamin E: Secondary | ICD-10-CM | POA: Diagnosis not present

## 2021-05-08 DIAGNOSIS — Z7982 Long term (current) use of aspirin: Secondary | ICD-10-CM | POA: Diagnosis not present

## 2021-05-08 DIAGNOSIS — Z79899 Other long term (current) drug therapy: Secondary | ICD-10-CM | POA: Diagnosis not present

## 2021-05-08 DIAGNOSIS — J189 Pneumonia, unspecified organism: Secondary | ICD-10-CM | POA: Diagnosis not present

## 2021-05-08 DIAGNOSIS — D6869 Other thrombophilia: Secondary | ICD-10-CM | POA: Diagnosis not present

## 2021-05-08 DIAGNOSIS — I2510 Atherosclerotic heart disease of native coronary artery without angina pectoris: Secondary | ICD-10-CM | POA: Diagnosis not present

## 2021-05-08 DIAGNOSIS — Z8542 Personal history of malignant neoplasm of other parts of uterus: Secondary | ICD-10-CM | POA: Diagnosis not present

## 2021-05-08 DIAGNOSIS — R110 Nausea: Secondary | ICD-10-CM | POA: Diagnosis not present

## 2021-05-08 DIAGNOSIS — R062 Wheezing: Secondary | ICD-10-CM | POA: Diagnosis not present

## 2021-05-08 DIAGNOSIS — Z66 Do not resuscitate: Secondary | ICD-10-CM | POA: Diagnosis not present

## 2021-05-08 DIAGNOSIS — E43 Unspecified severe protein-calorie malnutrition: Secondary | ICD-10-CM | POA: Diagnosis not present

## 2021-05-08 DIAGNOSIS — J90 Pleural effusion, not elsewhere classified: Secondary | ICD-10-CM | POA: Diagnosis not present

## 2021-05-08 DIAGNOSIS — I959 Hypotension, unspecified: Secondary | ICD-10-CM | POA: Diagnosis not present

## 2021-05-08 DIAGNOSIS — R531 Weakness: Secondary | ICD-10-CM | POA: Diagnosis not present

## 2021-05-08 DIAGNOSIS — Z7901 Long term (current) use of anticoagulants: Secondary | ICD-10-CM | POA: Diagnosis not present

## 2021-05-08 DIAGNOSIS — Z794 Long term (current) use of insulin: Secondary | ICD-10-CM | POA: Diagnosis not present

## 2021-05-08 DIAGNOSIS — Z7722 Contact with and (suspected) exposure to environmental tobacco smoke (acute) (chronic): Secondary | ICD-10-CM | POA: Diagnosis not present

## 2021-05-08 DIAGNOSIS — E119 Type 2 diabetes mellitus without complications: Secondary | ICD-10-CM | POA: Diagnosis not present

## 2021-05-08 DIAGNOSIS — K219 Gastro-esophageal reflux disease without esophagitis: Secondary | ICD-10-CM | POA: Diagnosis not present

## 2021-05-08 DIAGNOSIS — I10 Essential (primary) hypertension: Secondary | ICD-10-CM | POA: Diagnosis not present

## 2021-05-08 DIAGNOSIS — K8689 Other specified diseases of pancreas: Secondary | ICD-10-CM | POA: Diagnosis not present

## 2021-05-08 DIAGNOSIS — Z888 Allergy status to other drugs, medicaments and biological substances status: Secondary | ICD-10-CM | POA: Diagnosis not present

## 2021-05-08 DIAGNOSIS — E559 Vitamin D deficiency, unspecified: Secondary | ICD-10-CM | POA: Diagnosis not present

## 2021-05-08 DIAGNOSIS — I480 Paroxysmal atrial fibrillation: Secondary | ICD-10-CM | POA: Diagnosis not present

## 2021-05-08 DIAGNOSIS — R0689 Other abnormalities of breathing: Secondary | ICD-10-CM | POA: Diagnosis not present

## 2021-05-08 DIAGNOSIS — R059 Cough, unspecified: Secondary | ICD-10-CM | POA: Diagnosis not present

## 2021-05-08 DIAGNOSIS — Z9889 Other specified postprocedural states: Secondary | ICD-10-CM | POA: Diagnosis not present

## 2021-05-09 ENCOUNTER — Encounter: Payer: Self-pay | Admitting: Physician Assistant

## 2021-05-09 DIAGNOSIS — Z7901 Long term (current) use of anticoagulants: Secondary | ICD-10-CM | POA: Diagnosis not present

## 2021-05-09 DIAGNOSIS — R0602 Shortness of breath: Secondary | ICD-10-CM | POA: Diagnosis not present

## 2021-05-09 DIAGNOSIS — E872 Acidosis, unspecified: Secondary | ICD-10-CM | POA: Diagnosis not present

## 2021-05-09 DIAGNOSIS — C3412 Malignant neoplasm of upper lobe, left bronchus or lung: Secondary | ICD-10-CM | POA: Diagnosis not present

## 2021-05-09 DIAGNOSIS — I2510 Atherosclerotic heart disease of native coronary artery without angina pectoris: Secondary | ICD-10-CM | POA: Diagnosis not present

## 2021-05-09 DIAGNOSIS — R531 Weakness: Secondary | ICD-10-CM | POA: Diagnosis not present

## 2021-05-09 DIAGNOSIS — R059 Cough, unspecified: Secondary | ICD-10-CM | POA: Diagnosis not present

## 2021-05-09 DIAGNOSIS — I480 Paroxysmal atrial fibrillation: Secondary | ICD-10-CM | POA: Diagnosis not present

## 2021-05-09 DIAGNOSIS — C49A0 Gastrointestinal stromal tumor, unspecified site: Secondary | ICD-10-CM | POA: Diagnosis not present

## 2021-05-09 DIAGNOSIS — E119 Type 2 diabetes mellitus without complications: Secondary | ICD-10-CM | POA: Diagnosis not present

## 2021-05-10 DIAGNOSIS — I480 Paroxysmal atrial fibrillation: Secondary | ICD-10-CM | POA: Diagnosis not present

## 2021-05-10 DIAGNOSIS — I2510 Atherosclerotic heart disease of native coronary artery without angina pectoris: Secondary | ICD-10-CM | POA: Diagnosis not present

## 2021-05-10 DIAGNOSIS — Z681 Body mass index (BMI) 19 or less, adult: Secondary | ICD-10-CM | POA: Diagnosis not present

## 2021-05-10 DIAGNOSIS — C3412 Malignant neoplasm of upper lobe, left bronchus or lung: Secondary | ICD-10-CM | POA: Diagnosis not present

## 2021-05-10 DIAGNOSIS — Z7901 Long term (current) use of anticoagulants: Secondary | ICD-10-CM | POA: Diagnosis not present

## 2021-05-10 DIAGNOSIS — Z7722 Contact with and (suspected) exposure to environmental tobacco smoke (acute) (chronic): Secondary | ICD-10-CM | POA: Diagnosis not present

## 2021-05-10 DIAGNOSIS — J121 Respiratory syncytial virus pneumonia: Secondary | ICD-10-CM | POA: Diagnosis not present

## 2021-05-10 DIAGNOSIS — C49A0 Gastrointestinal stromal tumor, unspecified site: Secondary | ICD-10-CM | POA: Diagnosis not present

## 2021-05-10 DIAGNOSIS — Z79899 Other long term (current) drug therapy: Secondary | ICD-10-CM | POA: Diagnosis not present

## 2021-05-10 DIAGNOSIS — E43 Unspecified severe protein-calorie malnutrition: Secondary | ICD-10-CM | POA: Diagnosis not present

## 2021-05-10 DIAGNOSIS — Z9889 Other specified postprocedural states: Secondary | ICD-10-CM | POA: Diagnosis not present

## 2021-05-10 DIAGNOSIS — E119 Type 2 diabetes mellitus without complications: Secondary | ICD-10-CM | POA: Diagnosis not present

## 2021-05-11 DIAGNOSIS — C3412 Malignant neoplasm of upper lobe, left bronchus or lung: Secondary | ICD-10-CM | POA: Diagnosis not present

## 2021-05-11 DIAGNOSIS — Z79899 Other long term (current) drug therapy: Secondary | ICD-10-CM | POA: Diagnosis not present

## 2021-05-11 DIAGNOSIS — Z7722 Contact with and (suspected) exposure to environmental tobacco smoke (acute) (chronic): Secondary | ICD-10-CM | POA: Diagnosis not present

## 2021-05-11 DIAGNOSIS — I2510 Atherosclerotic heart disease of native coronary artery without angina pectoris: Secondary | ICD-10-CM | POA: Diagnosis not present

## 2021-05-11 DIAGNOSIS — E119 Type 2 diabetes mellitus without complications: Secondary | ICD-10-CM | POA: Diagnosis not present

## 2021-05-11 DIAGNOSIS — E43 Unspecified severe protein-calorie malnutrition: Secondary | ICD-10-CM | POA: Diagnosis not present

## 2021-05-11 DIAGNOSIS — C49A0 Gastrointestinal stromal tumor, unspecified site: Secondary | ICD-10-CM | POA: Diagnosis not present

## 2021-05-11 DIAGNOSIS — Z9889 Other specified postprocedural states: Secondary | ICD-10-CM | POA: Diagnosis not present

## 2021-05-11 DIAGNOSIS — I480 Paroxysmal atrial fibrillation: Secondary | ICD-10-CM | POA: Diagnosis not present

## 2021-05-11 DIAGNOSIS — J121 Respiratory syncytial virus pneumonia: Secondary | ICD-10-CM | POA: Diagnosis not present

## 2021-05-11 DIAGNOSIS — Z7901 Long term (current) use of anticoagulants: Secondary | ICD-10-CM | POA: Diagnosis not present

## 2021-05-11 DIAGNOSIS — Z681 Body mass index (BMI) 19 or less, adult: Secondary | ICD-10-CM | POA: Diagnosis not present

## 2021-05-12 DIAGNOSIS — E119 Type 2 diabetes mellitus without complications: Secondary | ICD-10-CM | POA: Diagnosis not present

## 2021-05-12 DIAGNOSIS — I480 Paroxysmal atrial fibrillation: Secondary | ICD-10-CM | POA: Diagnosis not present

## 2021-05-12 DIAGNOSIS — J121 Respiratory syncytial virus pneumonia: Secondary | ICD-10-CM | POA: Diagnosis not present

## 2021-05-12 DIAGNOSIS — Z7901 Long term (current) use of anticoagulants: Secondary | ICD-10-CM | POA: Diagnosis not present

## 2021-05-12 DIAGNOSIS — Z9889 Other specified postprocedural states: Secondary | ICD-10-CM | POA: Diagnosis not present

## 2021-05-12 DIAGNOSIS — Z681 Body mass index (BMI) 19 or less, adult: Secondary | ICD-10-CM | POA: Diagnosis not present

## 2021-05-12 DIAGNOSIS — I2510 Atherosclerotic heart disease of native coronary artery without angina pectoris: Secondary | ICD-10-CM | POA: Diagnosis not present

## 2021-05-12 DIAGNOSIS — C49A0 Gastrointestinal stromal tumor, unspecified site: Secondary | ICD-10-CM | POA: Diagnosis not present

## 2021-05-12 DIAGNOSIS — C3412 Malignant neoplasm of upper lobe, left bronchus or lung: Secondary | ICD-10-CM | POA: Diagnosis not present

## 2021-05-12 DIAGNOSIS — Z79899 Other long term (current) drug therapy: Secondary | ICD-10-CM | POA: Diagnosis not present

## 2021-05-12 DIAGNOSIS — Z7722 Contact with and (suspected) exposure to environmental tobacco smoke (acute) (chronic): Secondary | ICD-10-CM | POA: Diagnosis not present

## 2021-05-12 DIAGNOSIS — E43 Unspecified severe protein-calorie malnutrition: Secondary | ICD-10-CM | POA: Diagnosis not present

## 2021-05-13 DIAGNOSIS — E560 Deficiency of vitamin E: Secondary | ICD-10-CM | POA: Diagnosis not present

## 2021-05-13 DIAGNOSIS — J90 Pleural effusion, not elsewhere classified: Secondary | ICD-10-CM | POA: Diagnosis not present

## 2021-05-13 DIAGNOSIS — Z789 Other specified health status: Secondary | ICD-10-CM | POA: Diagnosis not present

## 2021-05-13 DIAGNOSIS — E872 Acidosis, unspecified: Secondary | ICD-10-CM | POA: Diagnosis not present

## 2021-05-13 DIAGNOSIS — Z8509 Personal history of malignant neoplasm of other digestive organs: Secondary | ICD-10-CM | POA: Diagnosis not present

## 2021-05-13 DIAGNOSIS — E8729 Other acidosis: Secondary | ICD-10-CM | POA: Diagnosis not present

## 2021-05-13 DIAGNOSIS — Z66 Do not resuscitate: Secondary | ICD-10-CM | POA: Diagnosis not present

## 2021-05-13 DIAGNOSIS — M7989 Other specified soft tissue disorders: Secondary | ICD-10-CM | POA: Diagnosis not present

## 2021-05-13 DIAGNOSIS — I2510 Atherosclerotic heart disease of native coronary artery without angina pectoris: Secondary | ICD-10-CM | POA: Diagnosis not present

## 2021-05-13 DIAGNOSIS — Z7722 Contact with and (suspected) exposure to environmental tobacco smoke (acute) (chronic): Secondary | ICD-10-CM | POA: Diagnosis not present

## 2021-05-13 DIAGNOSIS — E119 Type 2 diabetes mellitus without complications: Secondary | ICD-10-CM | POA: Diagnosis not present

## 2021-05-13 DIAGNOSIS — E43 Unspecified severe protein-calorie malnutrition: Secondary | ICD-10-CM | POA: Diagnosis not present

## 2021-05-13 DIAGNOSIS — K8689 Other specified diseases of pancreas: Secondary | ICD-10-CM | POA: Diagnosis not present

## 2021-05-13 DIAGNOSIS — Z7901 Long term (current) use of anticoagulants: Secondary | ICD-10-CM | POA: Diagnosis not present

## 2021-05-13 DIAGNOSIS — Z9889 Other specified postprocedural states: Secondary | ICD-10-CM | POA: Diagnosis not present

## 2021-05-13 DIAGNOSIS — R0602 Shortness of breath: Secondary | ICD-10-CM | POA: Diagnosis not present

## 2021-05-13 DIAGNOSIS — B974 Respiratory syncytial virus as the cause of diseases classified elsewhere: Secondary | ICD-10-CM | POA: Diagnosis not present

## 2021-05-13 DIAGNOSIS — D6869 Other thrombophilia: Secondary | ICD-10-CM | POA: Diagnosis not present

## 2021-05-13 DIAGNOSIS — Z7984 Long term (current) use of oral hypoglycemic drugs: Secondary | ICD-10-CM | POA: Diagnosis not present

## 2021-05-13 DIAGNOSIS — J210 Acute bronchiolitis due to respiratory syncytial virus: Secondary | ICD-10-CM | POA: Diagnosis not present

## 2021-05-13 DIAGNOSIS — I480 Paroxysmal atrial fibrillation: Secondary | ICD-10-CM | POA: Diagnosis not present

## 2021-05-13 DIAGNOSIS — Z79899 Other long term (current) drug therapy: Secondary | ICD-10-CM | POA: Diagnosis not present

## 2021-05-13 DIAGNOSIS — G4700 Insomnia, unspecified: Secondary | ICD-10-CM | POA: Diagnosis not present

## 2021-05-13 DIAGNOSIS — C3412 Malignant neoplasm of upper lobe, left bronchus or lung: Secondary | ICD-10-CM | POA: Diagnosis not present

## 2021-05-13 DIAGNOSIS — C49A0 Gastrointestinal stromal tumor, unspecified site: Secondary | ICD-10-CM | POA: Diagnosis not present

## 2021-05-13 DIAGNOSIS — E559 Vitamin D deficiency, unspecified: Secondary | ICD-10-CM | POA: Diagnosis not present

## 2021-05-13 DIAGNOSIS — C3492 Malignant neoplasm of unspecified part of left bronchus or lung: Secondary | ICD-10-CM | POA: Diagnosis not present

## 2021-05-13 DIAGNOSIS — Z681 Body mass index (BMI) 19 or less, adult: Secondary | ICD-10-CM | POA: Diagnosis not present

## 2021-05-15 DIAGNOSIS — C3492 Malignant neoplasm of unspecified part of left bronchus or lung: Secondary | ICD-10-CM | POA: Diagnosis not present

## 2021-05-15 DIAGNOSIS — K8689 Other specified diseases of pancreas: Secondary | ICD-10-CM | POA: Diagnosis not present

## 2021-05-15 DIAGNOSIS — I480 Paroxysmal atrial fibrillation: Secondary | ICD-10-CM | POA: Diagnosis not present

## 2021-05-15 DIAGNOSIS — M7989 Other specified soft tissue disorders: Secondary | ICD-10-CM | POA: Diagnosis not present

## 2021-05-15 DIAGNOSIS — Z8509 Personal history of malignant neoplasm of other digestive organs: Secondary | ICD-10-CM | POA: Diagnosis not present

## 2021-05-31 ENCOUNTER — Other Ambulatory Visit: Payer: Self-pay | Admitting: Thoracic Surgery (Cardiothoracic Vascular Surgery)

## 2021-05-31 DIAGNOSIS — Z789 Other specified health status: Secondary | ICD-10-CM | POA: Diagnosis not present

## 2021-05-31 DIAGNOSIS — Z66 Do not resuscitate: Secondary | ICD-10-CM | POA: Diagnosis not present

## 2021-05-31 DIAGNOSIS — C3492 Malignant neoplasm of unspecified part of left bronchus or lung: Secondary | ICD-10-CM | POA: Diagnosis not present

## 2021-05-31 DIAGNOSIS — C3490 Malignant neoplasm of unspecified part of unspecified bronchus or lung: Secondary | ICD-10-CM

## 2021-05-31 DIAGNOSIS — E119 Type 2 diabetes mellitus without complications: Secondary | ICD-10-CM | POA: Diagnosis not present

## 2021-06-02 DIAGNOSIS — Z7982 Long term (current) use of aspirin: Secondary | ICD-10-CM | POA: Diagnosis not present

## 2021-06-02 DIAGNOSIS — Z9181 History of falling: Secondary | ICD-10-CM | POA: Diagnosis not present

## 2021-06-02 DIAGNOSIS — E43 Unspecified severe protein-calorie malnutrition: Secondary | ICD-10-CM | POA: Diagnosis not present

## 2021-06-02 DIAGNOSIS — E119 Type 2 diabetes mellitus without complications: Secondary | ICD-10-CM | POA: Diagnosis not present

## 2021-06-02 DIAGNOSIS — I82409 Acute embolism and thrombosis of unspecified deep veins of unspecified lower extremity: Secondary | ICD-10-CM | POA: Diagnosis not present

## 2021-06-02 DIAGNOSIS — C3412 Malignant neoplasm of upper lobe, left bronchus or lung: Secondary | ICD-10-CM | POA: Diagnosis not present

## 2021-06-02 DIAGNOSIS — Z8500 Personal history of malignant neoplasm of unspecified digestive organ: Secondary | ICD-10-CM | POA: Diagnosis not present

## 2021-06-02 DIAGNOSIS — I2510 Atherosclerotic heart disease of native coronary artery without angina pectoris: Secondary | ICD-10-CM | POA: Diagnosis not present

## 2021-06-02 DIAGNOSIS — Z7984 Long term (current) use of oral hypoglycemic drugs: Secondary | ICD-10-CM | POA: Diagnosis not present

## 2021-06-02 DIAGNOSIS — E785 Hyperlipidemia, unspecified: Secondary | ICD-10-CM | POA: Diagnosis not present

## 2021-06-02 DIAGNOSIS — J189 Pneumonia, unspecified organism: Secondary | ICD-10-CM | POA: Diagnosis not present

## 2021-06-02 DIAGNOSIS — Z7901 Long term (current) use of anticoagulants: Secondary | ICD-10-CM | POA: Diagnosis not present

## 2021-06-02 DIAGNOSIS — Z7722 Contact with and (suspected) exposure to environmental tobacco smoke (acute) (chronic): Secondary | ICD-10-CM | POA: Diagnosis not present

## 2021-06-02 DIAGNOSIS — I480 Paroxysmal atrial fibrillation: Secondary | ICD-10-CM | POA: Diagnosis not present

## 2021-06-02 DIAGNOSIS — J9601 Acute respiratory failure with hypoxia: Secondary | ICD-10-CM | POA: Diagnosis not present

## 2021-06-06 ENCOUNTER — Ambulatory Visit: Payer: Self-pay | Admitting: Thoracic Surgery (Cardiothoracic Vascular Surgery)

## 2021-06-08 ENCOUNTER — Ambulatory Visit: Payer: Self-pay | Admitting: Thoracic Surgery (Cardiothoracic Vascular Surgery)

## 2021-06-12 DIAGNOSIS — E43 Unspecified severe protein-calorie malnutrition: Secondary | ICD-10-CM | POA: Diagnosis not present

## 2021-06-12 DIAGNOSIS — I82409 Acute embolism and thrombosis of unspecified deep veins of unspecified lower extremity: Secondary | ICD-10-CM | POA: Diagnosis not present

## 2021-06-12 DIAGNOSIS — I480 Paroxysmal atrial fibrillation: Secondary | ICD-10-CM | POA: Diagnosis not present

## 2021-06-12 DIAGNOSIS — C3412 Malignant neoplasm of upper lobe, left bronchus or lung: Secondary | ICD-10-CM | POA: Diagnosis not present

## 2021-06-12 DIAGNOSIS — I2510 Atherosclerotic heart disease of native coronary artery without angina pectoris: Secondary | ICD-10-CM | POA: Diagnosis not present

## 2021-06-12 DIAGNOSIS — J189 Pneumonia, unspecified organism: Secondary | ICD-10-CM | POA: Diagnosis not present

## 2021-06-12 DIAGNOSIS — Z7982 Long term (current) use of aspirin: Secondary | ICD-10-CM | POA: Diagnosis not present

## 2021-06-12 DIAGNOSIS — E785 Hyperlipidemia, unspecified: Secondary | ICD-10-CM | POA: Diagnosis not present

## 2021-06-12 DIAGNOSIS — E119 Type 2 diabetes mellitus without complications: Secondary | ICD-10-CM | POA: Diagnosis not present

## 2021-06-12 DIAGNOSIS — Z8500 Personal history of malignant neoplasm of unspecified digestive organ: Secondary | ICD-10-CM | POA: Diagnosis not present

## 2021-06-12 DIAGNOSIS — Z7984 Long term (current) use of oral hypoglycemic drugs: Secondary | ICD-10-CM | POA: Diagnosis not present

## 2021-06-12 DIAGNOSIS — J9601 Acute respiratory failure with hypoxia: Secondary | ICD-10-CM | POA: Diagnosis not present

## 2021-06-12 DIAGNOSIS — Z7722 Contact with and (suspected) exposure to environmental tobacco smoke (acute) (chronic): Secondary | ICD-10-CM | POA: Diagnosis not present

## 2021-06-12 DIAGNOSIS — Z7901 Long term (current) use of anticoagulants: Secondary | ICD-10-CM | POA: Diagnosis not present

## 2021-06-12 DIAGNOSIS — Z9181 History of falling: Secondary | ICD-10-CM | POA: Diagnosis not present

## 2021-06-14 ENCOUNTER — Other Ambulatory Visit: Payer: Self-pay | Admitting: *Deleted

## 2021-06-14 ENCOUNTER — Telehealth: Payer: Self-pay | Admitting: *Deleted

## 2021-06-14 DIAGNOSIS — Z862 Personal history of diseases of the blood and blood-forming organs and certain disorders involving the immune mechanism: Secondary | ICD-10-CM | POA: Diagnosis not present

## 2021-06-14 DIAGNOSIS — C541 Malignant neoplasm of endometrium: Secondary | ICD-10-CM | POA: Diagnosis not present

## 2021-06-14 DIAGNOSIS — R636 Underweight: Secondary | ICD-10-CM | POA: Diagnosis not present

## 2021-06-14 DIAGNOSIS — Z86718 Personal history of other venous thrombosis and embolism: Secondary | ICD-10-CM | POA: Diagnosis not present

## 2021-06-14 DIAGNOSIS — E1169 Type 2 diabetes mellitus with other specified complication: Secondary | ICD-10-CM | POA: Diagnosis not present

## 2021-06-14 DIAGNOSIS — R634 Abnormal weight loss: Secondary | ICD-10-CM | POA: Diagnosis not present

## 2021-06-14 DIAGNOSIS — R54 Age-related physical debility: Secondary | ICD-10-CM | POA: Diagnosis not present

## 2021-06-14 DIAGNOSIS — Z7984 Long term (current) use of oral hypoglycemic drugs: Secondary | ICD-10-CM | POA: Diagnosis not present

## 2021-06-14 DIAGNOSIS — E440 Moderate protein-calorie malnutrition: Secondary | ICD-10-CM | POA: Diagnosis not present

## 2021-06-14 DIAGNOSIS — G4700 Insomnia, unspecified: Secondary | ICD-10-CM | POA: Diagnosis not present

## 2021-06-14 DIAGNOSIS — R64 Cachexia: Secondary | ICD-10-CM | POA: Diagnosis not present

## 2021-06-14 DIAGNOSIS — C7800 Secondary malignant neoplasm of unspecified lung: Secondary | ICD-10-CM | POA: Diagnosis not present

## 2021-06-14 MED ORDER — APIXABAN 2.5 MG PO TABS: 2.5000 mg | ORAL_TABLET | Freq: Two times a day (BID) | ORAL | 0 refills | Status: DC

## 2021-06-14 NOTE — Telephone Encounter (Signed)
Refill of Eliquis sent to patient's preferred pharmacy per Dr. Roxan Hockey. Patient aware.

## 2021-06-15 ENCOUNTER — Other Ambulatory Visit: Payer: Self-pay | Admitting: Internal Medicine

## 2021-06-15 ENCOUNTER — Telehealth: Payer: Self-pay

## 2021-06-15 DIAGNOSIS — Z1231 Encounter for screening mammogram for malignant neoplasm of breast: Secondary | ICD-10-CM

## 2021-06-15 NOTE — Telephone Encounter (Signed)
Received call from Ms. Barreira requesting an appointment with Dr. Berline Lopes. Follow up appointment with Dr. Berline Lopes scheduled for 07/21/21 2:15 pm. Patient is in agreement of date and time.

## 2021-06-26 DIAGNOSIS — M810 Age-related osteoporosis without current pathological fracture: Secondary | ICD-10-CM | POA: Diagnosis not present

## 2021-06-26 DIAGNOSIS — C541 Malignant neoplasm of endometrium: Secondary | ICD-10-CM | POA: Diagnosis not present

## 2021-06-26 DIAGNOSIS — I2510 Atherosclerotic heart disease of native coronary artery without angina pectoris: Secondary | ICD-10-CM | POA: Diagnosis not present

## 2021-06-26 DIAGNOSIS — D509 Iron deficiency anemia, unspecified: Secondary | ICD-10-CM | POA: Diagnosis not present

## 2021-06-26 DIAGNOSIS — R54 Age-related physical debility: Secondary | ICD-10-CM | POA: Diagnosis not present

## 2021-06-26 DIAGNOSIS — G4700 Insomnia, unspecified: Secondary | ICD-10-CM | POA: Diagnosis not present

## 2021-06-26 DIAGNOSIS — E1169 Type 2 diabetes mellitus with other specified complication: Secondary | ICD-10-CM | POA: Diagnosis not present

## 2021-06-26 DIAGNOSIS — E785 Hyperlipidemia, unspecified: Secondary | ICD-10-CM | POA: Diagnosis not present

## 2021-06-28 DIAGNOSIS — Z7722 Contact with and (suspected) exposure to environmental tobacco smoke (acute) (chronic): Secondary | ICD-10-CM | POA: Diagnosis not present

## 2021-06-28 DIAGNOSIS — E785 Hyperlipidemia, unspecified: Secondary | ICD-10-CM | POA: Diagnosis not present

## 2021-06-28 DIAGNOSIS — I480 Paroxysmal atrial fibrillation: Secondary | ICD-10-CM | POA: Diagnosis not present

## 2021-06-28 DIAGNOSIS — E119 Type 2 diabetes mellitus without complications: Secondary | ICD-10-CM | POA: Diagnosis not present

## 2021-06-28 DIAGNOSIS — Z7982 Long term (current) use of aspirin: Secondary | ICD-10-CM | POA: Diagnosis not present

## 2021-06-28 DIAGNOSIS — J189 Pneumonia, unspecified organism: Secondary | ICD-10-CM | POA: Diagnosis not present

## 2021-06-28 DIAGNOSIS — C3412 Malignant neoplasm of upper lobe, left bronchus or lung: Secondary | ICD-10-CM | POA: Diagnosis not present

## 2021-06-28 DIAGNOSIS — Z7984 Long term (current) use of oral hypoglycemic drugs: Secondary | ICD-10-CM | POA: Diagnosis not present

## 2021-06-28 DIAGNOSIS — I82409 Acute embolism and thrombosis of unspecified deep veins of unspecified lower extremity: Secondary | ICD-10-CM | POA: Diagnosis not present

## 2021-06-28 DIAGNOSIS — Z8500 Personal history of malignant neoplasm of unspecified digestive organ: Secondary | ICD-10-CM | POA: Diagnosis not present

## 2021-06-28 DIAGNOSIS — Z9181 History of falling: Secondary | ICD-10-CM | POA: Diagnosis not present

## 2021-06-28 DIAGNOSIS — I2510 Atherosclerotic heart disease of native coronary artery without angina pectoris: Secondary | ICD-10-CM | POA: Diagnosis not present

## 2021-06-28 DIAGNOSIS — J9601 Acute respiratory failure with hypoxia: Secondary | ICD-10-CM | POA: Diagnosis not present

## 2021-06-28 DIAGNOSIS — E43 Unspecified severe protein-calorie malnutrition: Secondary | ICD-10-CM | POA: Diagnosis not present

## 2021-06-28 DIAGNOSIS — Z7901 Long term (current) use of anticoagulants: Secondary | ICD-10-CM | POA: Diagnosis not present

## 2021-07-04 ENCOUNTER — Ambulatory Visit: Payer: PPO | Admitting: Thoracic Surgery (Cardiothoracic Vascular Surgery)

## 2021-07-17 ENCOUNTER — Telehealth: Payer: Self-pay

## 2021-07-17 ENCOUNTER — Ambulatory Visit: Payer: PPO | Admitting: Dermatology

## 2021-07-17 MED ORDER — APIXABAN 2.5 MG PO TABS: 2.5000 mg | ORAL_TABLET | Freq: Two times a day (BID) | ORAL | 0 refills | Status: DC

## 2021-07-17 NOTE — Telephone Encounter (Signed)
-----   Message from [name], MD sent at 07/17/2021  3:08 PM EST ----- Regarding: RE: Eliquis refill Yes give another refill  [facility] ----- Message ----- From: Donnella Sham, RN Sent: 07/17/2021  10:20 AM EST To: [name], MD Subject: Eliquis refill                                 Hey,  Patient called requesting a refill of Eliquis. She was to see you 07/04/21 but had to cancel because she was sick. Do you want her to continue it until you see her? Next appointment is 08/01/21.  Please advise.  Thanks,  Caryl Pina

## 2021-07-20 NOTE — Progress Notes (Signed)
Gynecologic Oncology Return Clinic Visit  07/21/21  Reason for Visit: surveillance visit in the setting of recurrent endometrial cancer  Treatment History: [name] is a 81 year old woman with a history of stage IA grade 1 endometrial cancer in 2018 who was seen in consultation at the request of Dr Delora Fuel for evaluation of a vaginal mass.   The patient underwent surgical staging for her endometrial cancer in May 2018.  This included a robotic total hysterectomy with BSO and sentinel lymph node biopsy.  Her surgery was complicated by extensive adhesions from her prior Whipple procedure.   Final pathology revealed a grade 1 endometrioid endometrial adenocarcinoma that measured 4 cm.  There was inner half myometrial invasion and negative sentinel lymph nodes with no lymphovascular space invasion.  She was characterizes having low risk endometrial cancer and therefore no adjuvant therapy was recommended in accordance with NCCN guidelines. MMR testing was not performed at that time, but later requested revealing loss of expression of MLH1 and PMS2 (MSI high), and ER /PR strongly positive.   She received subsequent follow-up with her benign gynecologist and was lost to follow-up with [place].   In early December 2021 she began experiencing postmenopausal bleeding.  She reported this to her primary care doctor who attempted speculum exam which was unsuccessful due to discomfort.  The patient was then seen by Dr. Delora Fuel who performed a limited speculum exam due to patient discomfort and recognized a vaginal mass.  She subsequently referred the patient to see GYN oncology.   The patient was then seen by me on 05/13/20 and on vaginal examination the posterior left vaginal wall/apex was replaced by tumor. Biopsy was performed which confirmed recurrent endometrioid endometrial cancer.    She received restaging with PET scan on 06/18/20 which revealed hypermetabolism noted in the vaginal cuff and upper  1/3 of the vagina consistent with known neoplasm. No locoregional lymphadenopathy or distant metastatic disease was seen. This suggested a localized pelvic recurrence and treatment with salvage radiation was recommended.   She received salvage radiation between 06/21/2020 through 08/25/2020.  This included external beam radiation with IMRT to the vagina and pelvis, total dose 45 Gy.  Additionally received she received vaginal and pelvic boost with HDR brachytherapy total dose of 24 Gy.   After being seeing for post-treatment surveillance in June, 2022, she was scheduled to have a post-treatment surveillance/evaluation PET scan on 11/11/20. This revealed complete resolution at the vaginal/pelvic recurrence site, however there was a new 12 mm posterior left upper lobe pulmonary nodule/metastasis seen.   The patient then immediately travelled to [place] to visit family for 3 months. Her case was presented at the thoracic multidisciplinary conference and the recommendation was made for referral/consultation with Dr Roxan Hockey from Thoracic Surgery. She elected to stay on on [place] until September, when she would return for repeat imaging and consultation with Thoracic Surgery.    On 01/28/21 she received a CT chest to monitor the pulmonary lesion and this showed an interval enlargement of the aggressive appearing left upper lobe pulmonary nodule which measured 1.7 x 1.2 x 1.2 cm. It was felt to likely represent a solitary metastatic lesion, however, the possibility of a primary bronchogenic neoplasm also warranted consideration, particularly in light of the morphology of the nodule. No new pulmonary nodules and no lymphadenopathy were noted in the thorax.   She was seen by Dr Roxan Hockey who recommended a VATS procedure.    On 10/13, she underwent Robotic LUL segmentectomy with  lymph node dissection with tear of the lingular arterial branch require thoracotomy to control bleeding. She was ultimately  discharged to home with home PT/OT and home health on 11/2. Final pathology revealed metastatic adenocarcinoma c/w known endometrial adenocarcinoma, negative lymph nodes. ER+.  Margins widely negative (1.6 cm and greater)  Interval History: The patient presents for follow-up today for the first time since her surgery in October.  She has had a very slow recovery complicated by RSV and pneumonia.  She continues to have significant fatigue and shortness of breath at baseline and with ambulation.  Her appetite is decreased although she is working to try to eat several meals a day.  She has lost about 25 pounds since October although thinks that this has mostly stabilized.  She notes some weakness to her voice, which she thinks is related to her overall fatigue as well as the pressure sensation in her mid chest since surgery.  She has to be careful about not standing up too quickly as she is afraid she will get dizzy.  She continues to have small mount of urinary incontinence, change from baseline.  She denies any vaginal bleeding or discharge.  She describes her bowel function is fairly solid although not daily, sometimes has up to 3 bowel movements in a day.  Comes in with her daughter today.  Past Medical/Surgical History: Past Medical History:  Diagnosis Date   Anemia    Atypical mole 05/15/2005   Left Mid Outer Back (slight to moderate)   BCC (basal cell carcinoma of skin) 11/17/1991   Right Cheek (excision)   BCC (basal cell carcinoma of skin) 05/30/1994   Left Upper Forehead (curet and 5FU)   BCC (basal cell carcinoma of skin) 05/04/1997   Right upper Forehead/hairline (curet and excision)   BCC (basal cell carcinoma of skin) 06/25/1997   Right Upper Forehead/hairline (+margin) (MOH's)   BCC (basal cell carcinoma of skin) 11/12/2006   Left Cheek (curet and excision)   BCC (basal cell carcinoma of skin) 08/15/2009   Right Lower Leg (curet 5FU)   BCE (basal cell epithelioma), leg, right     Cataract 2021   Colon polyps    Coronary artery disease 2008   Coronary atherosclerosis    Diabetes mellitus without complication ([place])    pre-diabetic   DVT of lower extremity (deep venous thrombosis) ([place]) 07/2010   endometrioid endometrial    GI bleed 2009   GIST (gastrointestinal stromal tumor), malignant ([place])    T2N0   H pylori ulcer 2001   History of radiation therapy 06/21/20-07/25/20   endometrium, IMRT     Dr. Sondra Come   History of radiation therapy 08/17/2020, 08/23/2020, 08/25/2020   vaginal brachytherapy     Dr Gery Pray   Hypercholesteremia    Hyperlipidemia    Hypertension    per patient-on medication for preventative/has not diagnosed with HTN    Insomnia    Iron deficiency    Joint pain    Lung cancer ([place])    Osteopenia    Reflux    SCCA (squamous cell carcinoma) of skin 05/15/2005   Left Chest (in situ)   SCCA (squamous cell carcinoma) of skin 10/24/2011   Left Side of Chest Med (in situ) (tx p bx)   SCCA (squamous cell carcinoma) of skin 01/28/2019   Right Forearm inf (Keratoacanthoma) (tx p bx)   Superficial basal cell carcinoma (BCC) 06/25/1995   Upper Forehead at scar (curet and 5FU)   Ulcer  epigastric     Past Surgical History:  Procedure Laterality Date   ABDOMINAL HYSTERECTOMY     AUGMENTATION MAMMAPLASTY     BREAST ENHANCEMENT SURGERY     since removed in 2016   [place]     pt claims she has never had a cardiac catheterization   CATARACT EXTRACTION Bilateral 2021   CHOLECYSTECTOMY     INTERCOSTAL NERVE BLOCK Left 03/09/2021   Procedure: INTERCOSTAL NERVE BLOCK;  Surgeon: [name], MD;  Location: [facility];  Service: Thoracic;  Laterality: Left;   LYMPH NODE DISSECTION Left 03/09/2021   Procedure: LYMPH NODE DISSECTION;  Surgeon: [name], MD;  Location: [facility];  Service: Thoracic;  Laterality: Left;   [place]  06/27/2010   whipple     ROBOTIC ASSISTED TOTAL HYSTERECTOMY WITH BILATERAL SALPINGO OOPHERECTOMY Bilateral 10/02/2016   Procedure: XI ROBOTIC ASSISTED TOTAL HYSTERECTOMY WITH BILATERAL SALPINGO OOPHORECTOMY WITH SENTINAL LYMPH NODE BIOPSY LYSIS OF ADHESIONS, EXPLORATORY LAPAROTOMY;  Surgeon: Everitt Amber, MD;  Location: [facility];  Service: Gynecology;  Laterality: Bilateral;   THORACOTOMY Left 03/09/2021   Procedure: THORACOTOMY MAJOR;  Surgeon: [name], MD;  Location[facility];  Service: Thoracic;  Laterality: Left;   TUBAL LIGATION     WHIPPLE PROCEDURE  2012    Family History  Problem Relation Age of Onset   Diabetes Mother    Dementia Mother    Hypertension Mother    Heart attack Father    Heart attack Brother    Cancer Brother        melanoma    Social History   Socioeconomic History   Marital status: Widowed    Spouse name: Not on file   Number of children: 1   Years of education: Not on file   Highest education level: Not on file  Occupational History   Occupation: accounting    Comment: retired  Tobacco Use   Smoking status: Never   Smokeless tobacco: Never  Vaping Use   Vaping Use: Never used  Substance and Sexual Activity   Alcohol use: No   Drug use: No   Sexual activity: Not Currently    Birth control/protection: Surgical  Other Topics Concern   Not on file  Social History Narrative   ** Merged History Encounter **       Social Determinants of Health   Financial Resource Strain: Not on file  Food Insecurity: Not on file  Transportation Needs: Not on file  Physical Activity: Not on file  Stress: Not on file  Social Connections: Not on file    Current Medications:  Current Outpatient Medications:    amiodarone (PACERONE) 200 MG tablet, Take 1 tablet (200 mg total) by mouth daily., Disp: 60 tablet, Rfl: 1   apixaban (ELIQUIS) 2.5 MG TABS tablet, Take 1 tablet (2.5 mg total) by mouth 2 (two) times daily., Disp: 60 tablet, Rfl: 0   Apoaequorin (PREVAGEN PO),  Take 1 tablet by mouth daily., Disp: , Rfl:    Ascorbic Acid (VITAMIN C) 500 MG CAPS, Take 500 mg by mouth daily., Disp: , Rfl:    Cholecalciferol (VITAMIN D) 50 MCG (2000 UT) tablet, Take 2,000 Units by mouth daily., Disp: , Rfl:    Coenzyme Q10 (COQ-10) 100 MG CAPS, Take 100 mg by mouth at bedtime., Disp: , Rfl:    CREON 12000-38000 units CPEP capsule, Take 12,000 Units by mouth  3 (three) times daily before meals., Disp: , Rfl:    empagliflozin (JARDIANCE) 10 MG TABS tablet, Take 10 mg by mouth daily., Disp: , Rfl:    Melatonin 10 MG TABS, Take 10 mg by mouth at bedtime as needed (sleep)., Disp: , Rfl:    ONETOUCH VERIO test strip, daily., Disp: , Rfl:    Probiotic Product (ALIGN) 4 MG CAPS, Take 4 mg by mouth daily., Disp: , Rfl:    sucralfate (CARAFATE) 1 g tablet, Take 1 g by mouth 2 (two) times daily., Disp: , Rfl:    vitamin E 180 MG (400 UNITS) capsule, Take 400 Units by mouth daily., Disp: , Rfl:    aspirin 81 MG chewable tablet, Chew 1 tablet by mouth daily. (Patient not taking: Reported on 05/02/2021), Disp: , Rfl:    simvastatin (ZOCOR) 20 MG tablet, Take 20 mg by mouth at bedtime., Disp: , Rfl:    zinc gluconate 50 MG tablet, Take 50 mg by mouth daily. (Patient not taking: Reported on 05/02/2021), Disp: , Rfl:   Review of Systems: Pertinent positives include weight loss, voice changes, shortness of breath, incontinence, hot flashes, difficulty walking. Denies appetite changes, fevers, chills. Denies hearing loss, neck lumps or masses, mouth sores, ringing in ears. Denies cough or wheezing. Denies chest pain or palpitations. Denies leg swelling. Denies abdominal distention, pain, blood in stools, constipation, diarrhea, nausea, vomiting, or early satiety. Denies pain with intercourse, dysuria, frequency, hematuria. Denies pelvic pain, vaginal bleeding or vaginal discharge.   Denies joint pain, back pain or muscle pain/cramps. Denies itching, rash, or wounds. Denies dizziness,  headaches, numbness or seizures. Denies swollen lymph nodes or glands, denies easy bruising or bleeding. Denies anxiety, depression, confusion, or decreased concentration.  Physical Exam: BP (!) 109/54 (BP Location: Right Arm, Patient Position: Sitting)    Pulse 92    Temp (!) 97.5 F (36.4 C) (Oral)    Resp 16    Ht 5' 4.17" (1.63 m)    Wt 108 lb (49 kg)    SpO2 99%    BMI 18.44 kg/m  General: Alert, oriented, no acute distress. HEENT: Normocephalic, atraumatic, sclera anicteric. Chest: Clear to auscultation bilaterally.  No wheezes or rhonchi. Cardiovascular: Regular rate and rhythm, no murmurs. Abdomen: soft, nontender.  Normoactive bowel sounds.  No masses or hepatosplenomegaly appreciated.  Well-healed incisions. Extremities: Grossly normal range of motion.  Warm, well perfused.  No edema bilaterally. Skin: No rashes or lesions noted. Lymphatics: No cervical, supraclavicular, or inguinal adenopathy. GU: Normal appearing external genitalia without erythema, excoriation, or lesions.  Speculum exam reveals agglutination of the upper vagina, can only place the speculum approximately 2-3 cm within the vagina.  Vaginal mucosa is atrophic with radiation changes noted.  No masses seen.  On rectovaginal exam, no masses or nodularity.  Laboratory & Radiologic Studies: None new  Assessment & Plan: [name] is a 81 y.o. woman with  history of recurrent endometrioid endometrial cancer at the vaginal cuff and posterior vaginal wall (MMR deficient, ER/PR positive) diagnosed in December, 2021 after initial staging surgery/diagnosis in May, 2018. S/p salvage RT to pelvis and vagina. Most recently had oligometastatic disease within the left upper lung status post resection in October complicated by bleeding.  Pathology confirms recurrent disease, margins widely negative.    Patient presents today for first follow-up after surgery.  She continues to have significant weakness and deconditioning  given complications with surgery and her postoperative course.  From a cancer standpoint, I have recommended  that we get repeat imaging, as her last imaging was the PET scan prior to her surgery.  In the setting of completely resected oligometastatic disease with negative margins, I would favor antiestrogen treatment given her ER positive cancer.  Today, based on the discussion during her visit, I worry that she may not tolerate any treatment at this time and needs to focus on continued recovery.  I think close follow-up is reasonable given widely negative margins and no other evidence on her preoperative imaging.  I will call her when I get the CT results to discuss if recommendations in terms of any treatment have changed.   We discussed the significant agglutination that she developed after radiation treatment.  Patient was scheduled to see radiation oncology in March.  I have asked Santiago Glad to reschedule visit with Dr. Sondra Come for 3 months from now.  We will continue with 8-monthsurveillance visits alternating between our clinic and radiation oncology.  38 minutes of total time was spent for this patient encounter, including preparation, face-to-face counseling with the patient and coordination of care, and documentation of the encounter.  KJeral Pinch MD  Division of Gynecologic Oncology  Department of Obstetrics and Gynecology  [facility]

## 2021-07-21 ENCOUNTER — Inpatient Hospital Stay: Payer: PPO | Attending: Gynecologic Oncology

## 2021-07-21 ENCOUNTER — Inpatient Hospital Stay (HOSPITAL_BASED_OUTPATIENT_CLINIC_OR_DEPARTMENT_OTHER): Payer: PPO | Admitting: Gynecologic Oncology

## 2021-07-21 ENCOUNTER — Ambulatory Visit
Admission: RE | Admit: 2021-07-21 | Discharge: 2021-07-21 | Disposition: A | Discharge status: Home / Self Care | Payer: PPO | Source: Ambulatory Visit | Attending: Internal Medicine | Admitting: Internal Medicine

## 2021-07-21 ENCOUNTER — Encounter: Payer: Self-pay | Admitting: Gynecologic Oncology

## 2021-07-21 ENCOUNTER — Other Ambulatory Visit: Payer: Self-pay

## 2021-07-21 VITALS — BP 109/54 | HR 92 | Temp 97.5°F | Resp 16 | Ht 64.17 in | Wt 108.0 lb

## 2021-07-21 DIAGNOSIS — Z1231 Encounter for screening mammogram for malignant neoplasm of breast: Secondary | ICD-10-CM

## 2021-07-21 DIAGNOSIS — Z90722 Acquired absence of ovaries, bilateral: Secondary | ICD-10-CM | POA: Diagnosis not present

## 2021-07-21 DIAGNOSIS — Z90710 Acquired absence of both cervix and uterus: Secondary | ICD-10-CM | POA: Diagnosis not present

## 2021-07-21 DIAGNOSIS — C541 Malignant neoplasm of endometrium: Secondary | ICD-10-CM | POA: Diagnosis not present

## 2021-07-21 DIAGNOSIS — Z8542 Personal history of malignant neoplasm of other parts of uterus: Secondary | ICD-10-CM | POA: Diagnosis not present

## 2021-07-21 DIAGNOSIS — Z85118 Personal history of other malignant neoplasm of bronchus and lung: Secondary | ICD-10-CM | POA: Diagnosis not present

## 2021-07-21 DIAGNOSIS — Z902 Acquired absence of lung [part of]: Secondary | ICD-10-CM | POA: Insufficient documentation

## 2021-07-21 DIAGNOSIS — R5381 Other malaise: Secondary | ICD-10-CM

## 2021-07-21 DIAGNOSIS — Z923 Personal history of irradiation: Secondary | ICD-10-CM | POA: Diagnosis not present

## 2021-07-21 DIAGNOSIS — R5383 Other fatigue: Secondary | ICD-10-CM

## 2021-07-21 LAB — BASIC METABOLIC PANEL - CANCER CENTER ONLY
Anion gap: 11 (ref 5–15)
BUN: 34 mg/dL — ABNORMAL HIGH (ref 8–23)
CO2: 22 mmol/L (ref 22–32)
Calcium: 9.7 mg/dL (ref 8.9–10.3)
Chloride: 104 mmol/L (ref 98–111)
Creatinine: 0.84 mg/dL (ref 0.44–1.00)
GFR, Estimated: >60 mL/min (ref >60)
Glucose, Bld: 171 mg/dL — ABNORMAL HIGH (ref 70–99)
Potassium: 4.6 mmol/L (ref 3.5–5.1)
Sodium: 137 mmol/L (ref 135–145)

## 2021-07-21 NOTE — Patient Instructions (Signed)
It was good to meet you today.  I do not see or feel any evidence of cancer recurrence on your exam.  I am recommending imaging though to follow-up after your surgery.  I will let you know the results of your CT scan once I have them.  We will continue with follow-up visits every 3 months, alternating between my office and radiation oncology.  He will see Dr. Sondra Come next. Please call my office in June or July to get a follow-up scheduled with me in late August.  If you develop any new symptoms before then, please call to see me sooner.

## 2021-08-01 ENCOUNTER — Ambulatory Visit
Admission: RE | Admit: 2021-08-01 | Discharge: 2021-08-01 | Disposition: A | Discharge status: Home / Self Care | Payer: PPO | Source: Ambulatory Visit | Attending: Thoracic Surgery (Cardiothoracic Vascular Surgery) | Admitting: Thoracic Surgery (Cardiothoracic Vascular Surgery)

## 2021-08-01 ENCOUNTER — Other Ambulatory Visit: Payer: Self-pay

## 2021-08-01 ENCOUNTER — Ambulatory Visit: Payer: PPO | Admitting: Thoracic Surgery (Cardiothoracic Vascular Surgery)

## 2021-08-01 VITALS — BP 133/69 | HR 60 | Resp 20 | Ht 64.5 in | Wt 107.0 lb

## 2021-08-01 DIAGNOSIS — Z9889 Other specified postprocedural states: Secondary | ICD-10-CM

## 2021-08-01 DIAGNOSIS — C3412 Malignant neoplasm of upper lobe, left bronchus or lung: Secondary | ICD-10-CM

## 2021-08-01 DIAGNOSIS — C3490 Malignant neoplasm of unspecified part of unspecified bronchus or lung: Secondary | ICD-10-CM

## 2021-08-01 DIAGNOSIS — R918 Other nonspecific abnormal finding of lung field: Secondary | ICD-10-CM | POA: Diagnosis not present

## 2021-08-01 NOTE — Progress Notes (Signed)
? ?   ?[address] ?      [place] [address] ?            [phone]   ? ? ?HPI: [name] returns for a scheduled follow-up visit after a segmentectomy ? ?[name] is a 81 year old woman with a history of endometrial cancer, adenocarcinoma of the vaginal cuff, DVT, malignant gastrointestinal stromal tumor, Whipple procedure, type 2 diabetes, coronary atherosclerosis, hypertension, hyperlipidemia, iron deficiency anemia, arthritis, and reflux.  She had a robotic assisted left upper lobe segmentectomy with thoracotomy for control bleeding on 03/09/2021.  Postoperatively she had paroxysmal atrial fibrillation and was treated with Lopressor, amiodarone, and apixaban.  She had a difficult time postoperatively and was finally discharged after 17 days.  In December she had RSV and pneumonia.  She has remained very short of breath and weak since then. ? ?Today she says she has felt poorly all day.  She complains of tightness in her chest.  This isn't necessarily exertional, but is aggravated by exertion.  She has lost a tremendous amount of weight since her surgery and really has not been able to gain any significant weight back.  She thinks that amiodarone and Eliquis are contributing to her shortness of breath. ? ?Past Medical History:  ?Diagnosis Date  ? Anemia   ? Atypical mole 05/15/2005  ? Left Mid Outer Back (slight to moderate)  ? BCC (basal cell carcinoma of skin) 11/17/1991  ? Right Cheek (excision)  ? BCC (basal cell carcinoma of skin) 05/30/1994  ? Left Upper Forehead (curet and 5FU)  ? BCC (basal cell carcinoma of skin) 05/04/1997  ? Right upper Forehead/hairline (curet and excision)  ? BCC (basal cell carcinoma of skin) 06/25/1997  ? Right Upper Forehead/hairline (+margin) [facility])  ? BCC (basal cell carcinoma of skin) 11/12/2006  ? Left Cheek (curet and excision)  ? BCC (basal cell carcinoma of skin) 08/15/2009  ? Right Lower Leg (curet 5FU)  ? BCE (basal cell epithelioma),  leg, right   ? Cataract 2021  ? Colon polyps   ? Coronary artery disease 2008  ? Coronary atherosclerosis   ? Diabetes mellitus without complication ([place])   ? pre-diabetic  ? DVT of lower extremity (deep venous thrombosis) ([place]) 07/2010  ? endometrioid endometrial   ? GI bleed 2009  ? GIST (gastrointestinal stromal tumor), malignant ([place])   ? T2N0  ? H pylori ulcer 2001  ? History of radiation therapy 06/21/20-07/25/20  ? endometrium, IMRT     Dr. Sondra Come  ? History of radiation therapy 08/17/2020, 08/23/2020, 08/25/2020  ? vaginal brachytherapy     Dr Gery Pray  ? Hypercholesteremia   ? Hyperlipidemia   ? Hypertension   ? per patient-on medication for preventative/has not diagnosed with HTN   ? Insomnia   ? Iron deficiency   ? Joint pain   ? Lung cancer ([place])   ? Osteopenia   ? Reflux   ? SCCA (squamous cell carcinoma) of skin 05/15/2005  ? Left Chest (in situ)  ? SCCA (squamous cell carcinoma) of skin 10/24/2011  ? Left Side of Chest Med (in situ) (tx p bx)  ? SCCA (squamous cell carcinoma) of skin 01/28/2019  ? Right Forearm inf (Keratoacanthoma) (tx p bx)  ? Superficial basal cell carcinoma (BCC) 06/25/1995  ? Upper Forehead at scar (curet and 5FU)  ? Ulcer   ? epigastric   ? ? ? ?Current Outpatient Medications  ?Medication Sig Dispense Refill  ? amiodarone (  PACERONE) 200 MG tablet Take 1 tablet (200 mg total) by mouth daily. 60 tablet 1  ? apixaban (ELIQUIS) 2.5 MG TABS tablet Take 1 tablet (2.5 mg total) by mouth 2 (two) times daily. 60 tablet 0  ? Apoaequorin (PREVAGEN PO) Take 1 tablet by mouth daily.    ? Ascorbic Acid (VITAMIN C) 500 MG CAPS Take 500 mg by mouth daily.    ? Cholecalciferol (VITAMIN D) 50 MCG (2000 UT) tablet Take 2,000 Units by mouth daily.    ? Coenzyme Q10 (COQ-10) 100 MG CAPS Take 100 mg by mouth at bedtime.    ? CREON 12000-38000 units CPEP capsule Take 12,000 Units by mouth 3 (three) times daily before meals.    ? empagliflozin (JARDIANCE) 10 MG TABS tablet Take 10 mg by mouth daily.     ? Melatonin 10 MG TABS Take 10 mg by mouth at bedtime as needed (sleep).    ? ONETOUCH VERIO test strip daily.    ? Probiotic Product (ALIGN) 4 MG CAPS Take 4 mg by mouth daily.    ? simvastatin (ZOCOR) 20 MG tablet Take 20 mg by mouth at bedtime.    ? sucralfate (CARAFATE) 1 g tablet Take 1 g by mouth 2 (two) times daily.    ? vitamin E 180 MG (400 UNITS) capsule Take 400 Units by mouth daily.    ? zinc gluconate 50 MG tablet Take 50 mg by mouth daily.    ? aspirin 81 MG chewable tablet Chew 1 tablet by mouth daily. (Patient not taking: Reported on 08/01/2021)    ? ?No current facility-administered medications for this visit.  ? ? ?Physical Exam ? ?Diagnostic Tests: ?CHEST - 2 VIEW ?  ?COMPARISON:  05/02/2021. ?  ?FINDINGS: ?Postoperative changes of the left lung with associated volume loss, ?similar. Chain sutures. Left basilar scarring and or atelectasis. No ?confluent consolidation. No visible pneumothorax. Mild blunting of ?the left costophrenic sulcus, potentially small left pleural ?effusion or scarring. Cardiac silhouette is partially obscured but ?visible portions appear similar. ?  ?IMPRESSION: ?Postoperative changes on the left. Similar left basilar ?atelectasis/scarring and small pleural effusion versus pleural ?thickening. ?  ?  ?Electronically Signed ?  By: Margaretha Sheffield M.D. ?  On: 08/01/2021 15:59 ?I personally reviewed the chest x-ray images.  There are postoperative changes on the left. ? ?Impression: ?[name] is a 81 year old woman with a history of endometrial cancer, adenocarcinoma of the vaginal cuff, DVT, malignant gastrointestinal stromal tumor, Whipple procedure, type 2 diabetes, coronary atherosclerosis, hypertension, hyperlipidemia, iron deficiency anemia, arthritis, and reflux.   ? ?Dyspnea-out of proportion to chest x-ray findings.  Reviewing her PFTs she was at about 80% of normal prior to surgery.  Degree of dyspnea is also out of proportion to the amount of lung that was  resected.  She is scheduled to have a CT on Thursday.  I will review that to see if it offers any clues as to why she remains so dyspneic. ? ?Reviewing the CT images from her PET scan she had really significant coronary calcification.  Her symptoms are not classic for angina as it tends to be more prolonged tightness.  However I think it is worth investigating to see if there might be a cardiac component accounting for at least some of her symptoms. ? ?She is convinced that the amiodarone and apixaban are negatively impacting her respiratory status.  Is not completely out of the question that she could have some amiodarone induced pulmonary toxicity.  She is 6 months out from surgery so I think we can stop the amiodarone at this point.  She does understand that there is a risk she could go back into atrial fibrillation.  She also is adamant about wanting to stop apixaban.  She understands that if she does go into atrial fibrillation that she would be at high risk for having a stroke if she is only on aspirin rather than apixaban.  After reviewing the risks and benefits of that she wishes to stop that medication as well. ? ?Plan: ?Will review CT scan on Thursday 3/9 ?Discontinue amiodarone and apixaban. ?Restart aspirin 81 mg daily ?Referral to cardiology to see if there is possibly a cardiac component.  She does have very prominent coronary calcification on her CT images. ?Return in 1 month to check on progress ? ?[name], MD ?[facility] ?[phone] ? ? ? ? ?

## 2021-08-03 ENCOUNTER — Ambulatory Visit (HOSPITAL_COMMUNITY)
Admission: RE | Admit: 2021-08-03 | Discharge: 2021-08-03 | Disposition: A | Discharge status: Home / Self Care | Payer: PPO | Source: Ambulatory Visit | Attending: Gynecologic Oncology | Admitting: Gynecologic Oncology

## 2021-08-03 ENCOUNTER — Other Ambulatory Visit: Payer: Self-pay

## 2021-08-03 DIAGNOSIS — M47814 Spondylosis without myelopathy or radiculopathy, thoracic region: Secondary | ICD-10-CM | POA: Diagnosis not present

## 2021-08-03 DIAGNOSIS — J90 Pleural effusion, not elsewhere classified: Secondary | ICD-10-CM | POA: Diagnosis not present

## 2021-08-03 DIAGNOSIS — J479 Bronchiectasis, uncomplicated: Secondary | ICD-10-CM | POA: Diagnosis not present

## 2021-08-03 DIAGNOSIS — I2510 Atherosclerotic heart disease of native coronary artery without angina pectoris: Secondary | ICD-10-CM | POA: Diagnosis not present

## 2021-08-03 DIAGNOSIS — C541 Malignant neoplasm of endometrium: Secondary | ICD-10-CM | POA: Diagnosis not present

## 2021-08-03 DIAGNOSIS — M47816 Spondylosis without myelopathy or radiculopathy, lumbar region: Secondary | ICD-10-CM | POA: Diagnosis not present

## 2021-08-03 DIAGNOSIS — I700 Atherosclerosis of aorta: Secondary | ICD-10-CM | POA: Diagnosis not present

## 2021-08-03 DIAGNOSIS — M954 Acquired deformity of chest and rib: Secondary | ICD-10-CM | POA: Diagnosis not present

## 2021-08-03 MED ORDER — SODIUM CHLORIDE (PF) 0.9 % IJ SOLN
INTRAMUSCULAR | Status: AC
  Filled 2021-08-03: qty 50

## 2021-08-03 MED ORDER — IOHEXOL 300 MG/ML  SOLN
100.0000 mL | Freq: Once | INTRAMUSCULAR | Status: AC | PRN
  Administered 2021-08-03: 12:00:00 100 mL via INTRAVENOUS

## 2021-08-04 ENCOUNTER — Telehealth: Payer: Self-pay | Admitting: Gynecologic Oncology

## 2021-08-04 NOTE — Telephone Encounter (Signed)
I called the patient to discuss CT findings.  No answer.  Left message with callback requested. ? ?There are some changes from surgery in the left lung.  There are also some findings that may represent an infectious process of the right lung although nonspecific.  There are no findings of new or recurrent cancer seen. ? ?On my message, I stated that the patient could call back later today or on Monday.  If I am unavailable, one of my other office staff will be able to give her the message from her CT report. ? ?Jeral Pinch MD ?Gynecologic Oncology ? ?

## 2021-08-07 ENCOUNTER — Telehealth: Payer: Self-pay | Admitting: *Deleted

## 2021-08-07 NOTE — Telephone Encounter (Signed)
Spoke with [name] this afternoon regarding her CT results. Per Dr.Tucker there are some changes from surgery in the left lung.  There are also some findings that may represent an infectious process of the right lung although nonspecific.  There are no findings of new or recurrent cancer seen. Pt stated that this may be the cause as to why she's feels some pressure in her lungs and SOB lately.  She stated Dr.Hendrickson is aware of it and has referred her to a heart specialist Dr.Berry. She has an appointment set with him on 08/16/21.  Pt verbalized understanding and has no additional questions.  ?

## 2021-08-10 ENCOUNTER — Telehealth: Payer: Self-pay

## 2021-08-10 NOTE — Telephone Encounter (Signed)
-----   Message from [name], MD sent at 08/10/2021  3:22 PM EDT ----- ?Regarding: RE: CT scan review ?I called and left her a message.  ? ?[facility] ?----- Message ----- ?From: Donnella Sham, RN ?Sent: 08/07/2021   2:48 PM EDT ?To: [name], MD ?Subject: CT scan review                                ? ?Hey, ? ?Patient called to state she did have her chest CT done and would like for you to review it. Just FYI. ? ?Thanks, ? ?Caryl Pina  ? ? ?

## 2021-08-14 ENCOUNTER — Ambulatory Visit: Payer: Self-pay | Admitting: Radiation Oncology

## 2021-08-16 ENCOUNTER — Ambulatory Visit: Payer: PPO | Admitting: Cardiovascular Disease

## 2021-08-23 ENCOUNTER — Ambulatory Visit: Payer: PPO | Admitting: Cardiovascular Disease

## 2021-08-23 ENCOUNTER — Encounter: Payer: Self-pay | Admitting: Cardiovascular Disease

## 2021-08-23 ENCOUNTER — Other Ambulatory Visit: Payer: Self-pay

## 2021-08-23 VITALS — BP 126/60 | HR 68 | Ht 65.0 in | Wt 106.0 lb

## 2021-08-23 DIAGNOSIS — R0609 Other forms of dyspnea: Secondary | ICD-10-CM | POA: Diagnosis not present

## 2021-08-23 DIAGNOSIS — R072 Precordial pain: Secondary | ICD-10-CM

## 2021-08-23 DIAGNOSIS — I10 Essential (primary) hypertension: Secondary | ICD-10-CM | POA: Diagnosis not present

## 2021-08-23 DIAGNOSIS — E782 Mixed hyperlipidemia: Secondary | ICD-10-CM

## 2021-08-23 DIAGNOSIS — R071 Chest pain on breathing: Secondary | ICD-10-CM | POA: Diagnosis not present

## 2021-08-23 MED ORDER — METOPROLOL TARTRATE 100 MG PO TABS: 100.0000 mg | ORAL_TABLET | Freq: Once | ORAL | 0 refills | Status: DC

## 2021-08-23 NOTE — Assessment & Plan Note (Signed)
[name] has had progressive dyspnea on exertion.  She had a VATS procedure performed Dr. Roxan Hockey 03/09/2021 of her left upper lobe because of a nodule that turned out to be adenocarcinoma.  She then developed RSV pneumonia in December and was hospitalized for 10 days and later had 2 weeks of rehab.  Since that time she had progressive dyspnea on exertion.  I am going to get a 2D echo to further evaluate. ?

## 2021-08-23 NOTE — Assessment & Plan Note (Signed)
History of essential hypertension blood pressure measured today at 126/60.  She is not on antihypertensive medications. ?

## 2021-08-23 NOTE — Patient Instructions (Signed)
Medication Instructions:  ?Your physician recommends that you continue on your current medications as directed. Please refer to the Current Medication list given to you today. ? ?*If you need a refill on your cardiac medications before your next appointment, please call your pharmacy* ? ? ?Lab Work: ?Your physician recommends that you have labs drawn today: BMET ? ?If you have labs (blood work) drawn today and your tests are completely normal, you will receive your results only by: ?MyChart Message (if you have MyChart) OR ?A paper copy in the mail ?If you have any lab test that is abnormal or we need to change your treatment, we will call you to review the results. ? ? ?Testing/Procedures: ?Your physician has requested that you have an echocardiogram. Echocardiography is a painless test that uses sound waves to create images of your heart. It provides your doctor with information about the size and shape of your heart and how well your heart?s chambers and valves are working. This procedure takes approximately one hour. There are no restrictions for this procedure. This procedure will be done at [address] ? ? ? ?Follow-Up: ?At [facility], you and your health needs are our priority.  As part of our continuing mission to provide you with exceptional heart care, we have created designated Provider Care Teams.  These Care Teams include your primary Cardiologist (physician) and Advanced Practice Providers (APPs -  Physician Assistants and Nurse Practitioners) who all work together to provide you with the care you need, when you need it. ? ?We recommend signing up for the patient portal called "MyChart".  Sign up information is provided on this After Visit Summary.  MyChart is used to connect with patients for Virtual Visits (Telemedicine).  Patients are able to view lab/test results, encounter notes, upcoming appointments, etc.  Non-urgent messages can be sent to your provider as well.   ?To learn  more about what you can do with MyChart, go to [email or web address].   ? ?Your next appointment:   ?6 month(s) ? ?The format for your next appointment:   ?In Person ? ?Provider:   ?Quay Burow, MD ? ? ? ?Other Instructions ? ? ?Your cardiac CT will be scheduled at the below location:  ? ?[facility] ?[address] ?[place], [place] [address] ?[phone] ? ? ?If scheduled at [facility], please arrive at the [facility] and Children's Entrance (Entrance C2) of [facility] 30 minutes prior to test start time. ?You can use the FREE valet parking offered at entrance C (encouraged to control the heart rate for the test)  ?Proceed to the [facility] Radiology Department (first floor) to check-in and test prep. ? ?All radiology patients and guests should use entrance C2 at [facility], accessed from [facility], even though the hospital's physical address listed is [address]. ? ? ? ? ?Please follow these instructions carefully (unless otherwise directed): ? ? ?On the Night Before the Test: ?Be sure to Drink plenty of water. ?Do not consume any caffeinated/decaffeinated beverages or chocolate 12 hours prior to your test. ?Do not take any antihistamines 12 hours prior to your test. ? ?On the Day of the Test: ?Drink plenty of water until 1 hour prior to the test. ?Do not eat any food 4 hours prior to the test. ?You may take your regular medications prior to the test.  ?Take metoprolol (Lopressor)100mg  two hours prior to test. ?HOLD Furosemide/Hydrochlorothiazide morning of the test. ?  FEMALES- please wear underwire-free bra if available, avoid dresses & tight clothing ? ?     ?After the Test: ?Drink plenty of water. ?After receiving IV contrast, you may experience a mild flushed feeling. This is normal. ?On occasion, you may experience a mild rash up to 24 hours after the test. This is not dangerous. If this occurs, you can take Benadryl 25 mg and increase your  fluid intake. ?If you experience trouble breathing, this can be serious. If it is severe call 911 IMMEDIATELY. If it is mild, please call our office. ?If you take any of these medications: Glipizide/Metformin, Avandament, Glucavance, please do not take 48 hours after completing test unless otherwise instructed. ? ?We will call to schedule your test 2-4 weeks out understanding that some insurance companies will need an authorization prior to the service being performed.  ? ?For non-scheduling related questions, please contact the cardiac imaging nurse navigator should you have any questions/concerns: ?Marchia Bond, Cardiac Imaging Nurse Navigator ?Gordy Clement, Cardiac Imaging Nurse Navigator ?[facility] ?Direct Office Dial: [phone]  ? ?For scheduling needs, including cancellations and rescheduling, please call [place], [phone].  ? ?

## 2021-08-23 NOTE — Assessment & Plan Note (Signed)
History of hyperlipidemia not on statin therapy with lipid profile performed 05/03/2020 revealing total cholesterol 161, LDL of 42 and HDL 55. ?

## 2021-08-23 NOTE — H&P (View-Only) (Signed)
? ? ? ?08/23/2021 ?[name]   ?1941/03/06  ?[id number] ? ?Primary Physician [name], MD ?Primary Cardiologist: Lorretta Harp MD Lupe Carney, [place] ? ?HPI:  [name] is a 81 y.o.  mild to moderately overweight widowed Caucasian female mother of one daughter, the mother and 3 grandchildren who is retired from working at [employer]. She was referred by her PCP for cardiovascular evaluation because of atypical chest pain. Risk factors include treated hyperlipidemia. She does have a family history of heart disease with a father and brother both of whom died of myocardial infarctions. She has never had a heart attack or stroke. She has had a Whipple procedure 5 years ago by Dr. Barry Dienes and was having epigastric pain subsequent to that. He developed left inframammary pain over the last several weeks which is off and on. No other associated symptoms nor is there any radiation. ? ?Since I saw her 6 years ago I did do a Myoview stress test on her 05/16/2016 which was low risk and nonischemic.  She had endometrial cancer in January 2022 and have VATS procedure by Dr. Roxan Hockey 03/09/2021 for a left upper lobe nodule that was found to be adenocarcinoma.  In early December she had RSV pneumonia and was hospitalized for 10 days after which she spent 2 weeks in a rehab facility.  She has had progressive dyspnea as well as exertional chest pressure.  She did have a chest CT performed 08/03/2021 that did show coronary calcification. ? ? ?Current Meds  ?Medication Sig  ? Apoaequorin (PREVAGEN PO) Take 1 tablet by mouth daily.  ? Ascorbic Acid (VITAMIN C) 500 MG CAPS Take 500 mg by mouth daily.  ? aspirin 81 MG chewable tablet Chew 1 tablet by mouth daily.  ? Cholecalciferol (VITAMIN D) 50 MCG (2000 UT) tablet Take 2,000 Units by mouth daily.  ? CREON 12000-38000 units CPEP capsule Take 12,000 Units by mouth 3 (three) times daily before meals.  ? empagliflozin (JARDIANCE) 10  MG TABS tablet Take 10 mg by mouth daily.  ? Melatonin 10 MG TABS Take 10 mg by mouth at bedtime as needed (sleep).  ? ONETOUCH VERIO test strip daily.  ? Probiotic Product (ALIGN) 4 MG CAPS Take 4 mg by mouth daily.  ? sucralfate (CARAFATE) 1 g tablet Take 1 g by mouth 2 (two) times daily.  ? vitamin E 180 MG (400 UNITS) capsule Take 400 Units by mouth daily.  ? zinc gluconate 50 MG tablet Take 50 mg by mouth daily.  ?  ? ?Allergies  ?Allergen Reactions  ? Ibuprofen Hives  ? ? ?Social History  ? ?Socioeconomic History  ? Marital status: Widowed  ?  Spouse name: Not on file  ? Number of children: 1  ? Years of education: Not on file  ? Highest education level: Not on file  ?Occupational History  ? Occupation: accounting  ?  Comment: retired  ?Tobacco Use  ? Smoking status: Never  ? Smokeless tobacco: Never  ?Vaping Use  ? Vaping Use: Never used  ?Substance and Sexual Activity  ? Alcohol use: No  ? Drug use: No  ? Sexual activity: Not Currently  ?  Birth control/protection: Surgical  ?Other Topics Concern  ? Not on file  ?Social History Narrative  ? ** Merged History Encounter **  ?    ? ?Social Determinants of Health  ? ?Financial Resource Strain: Not on file  ?Food Insecurity: Not on file  ?Transportation Needs: Not  on file  ?Physical Activity: Not on file  ?Stress: Not on file  ?Social Connections: Not on file  ?Intimate Partner Violence: Not on file  ?  ? ?Review of Systems: ?General: negative for chills, fever, night sweats or weight changes.  ?Cardiovascular: negative for chest pain, dyspnea on exertion, edema, orthopnea, palpitations, paroxysmal nocturnal dyspnea or shortness of breath ?Dermatological: negative for rash ?Respiratory: negative for cough or wheezing ?Urologic: negative for hematuria ?Abdominal: negative for nausea, vomiting, diarrhea, bright red blood per rectum, melena, or hematemesis ?Neurologic: negative for visual changes, syncope, or dizziness ?All other systems reviewed and are otherwise  negative except as noted above. ? ? ? ?Blood pressure 126/60, pulse 68, height 5\' 5"  (1.651 m), weight 106 lb (48.1 kg), SpO2 98 %.  ?General appearance: alert and no distress ?Neck: no adenopathy, no carotid bruit, no JVD, supple, symmetrical, trachea midline, and thyroid not enlarged, symmetric, no tenderness/mass/nodules ?Lungs: clear to auscultation bilaterally ?Heart: regular rate and rhythm, S1, S2 normal, no murmur, click, rub or gallop ?Extremities: extremities normal, atraumatic, no cyanosis or edema ?Pulses: 2+ and symmetric ?Skin: Skin color, texture, turgor normal. No rashes or lesions ?Neurologic: Grossly normal ? ?EKG sinus rhythm at 68 with poor R wave progression.  I personally reviewed this EKG. ? ?ASSESSMENT AND PLAN:  ? ?Chest pain ?History of exertional chest pressure over the last several months.  She did have a low risk nonischemic Myoview which I ordered for atypical chest pain 05/16/2016.  I am going to get a coronary CTA to further evaluate. ? ?Hyperlipidemia ?History of hyperlipidemia not on statin therapy with lipid profile performed 05/03/2020 revealing total cholesterol 161, LDL of 42 and HDL 55. ? ?Essential hypertension ?History of essential hypertension blood pressure measured today at 126/60.  She is not on antihypertensive medications. ? ?Dyspnea on exertion ?Ms. Knight has had progressive dyspnea on exertion.  She had a VATS procedure performed Dr. Roxan Hockey 03/09/2021 of her left upper lobe because of a nodule that turned out to be adenocarcinoma.  She then developed RSV pneumonia in December and was hospitalized for 10 days and later had 2 weeks of rehab.  Since that time she had progressive dyspnea on exertion.  I am going to get a 2D echo to further evaluate. ? ? ? ? ?Lorretta Harp MD FACP,FACC,FAHA, FSCAI ?08/23/2021 ?11:09 AM ?

## 2021-08-23 NOTE — Assessment & Plan Note (Signed)
History of exertional chest pressure over the last several months.  She did have a low risk nonischemic Myoview which I ordered for atypical chest pain 05/16/2016.  I am going to get a coronary CTA to further evaluate. ?

## 2021-08-23 NOTE — Progress Notes (Signed)
? ? ? ?08/23/2021 ?[name]   ?April 06, 1941  ?[id number] ? ?Primary Physician [name], MD ?Primary Cardiologist: Lorretta Harp MD [name], [place] ? ?HPI:  [name] is a 81 y.o.  mild to moderately overweight widowed Caucasian female mother of one daughter, the mother and 3 grandchildren who is retired from working at [employer]. She was referred by her PCP for cardiovascular evaluation because of atypical chest pain. Risk factors include treated hyperlipidemia. She does have a family history of heart disease with a father and brother both of whom died of myocardial infarctions. She has never had a heart attack or stroke. She has had a Whipple procedure 5 years ago by Dr. Barry Dienes and was having epigastric pain subsequent to that. He developed left inframammary pain over the last several weeks which is off and on. No other associated symptoms nor is there any radiation. ? ?Since I saw her 6 years ago I did do a Myoview stress test on her 05/16/2016 which was low risk and nonischemic.  She had endometrial cancer in January 2022 and have VATS procedure by Dr. Roxan Hockey 03/09/2021 for a left upper lobe nodule that was found to be adenocarcinoma.  In early December she had RSV pneumonia and was hospitalized for 10 days after which she spent 2 weeks in a rehab facility.  She has had progressive dyspnea as well as exertional chest pressure.  She did have a chest CT performed 08/03/2021 that did show coronary calcification. ? ? ?Current Meds  ?Medication Sig  ? Apoaequorin (PREVAGEN PO) Take 1 tablet by mouth daily.  ? Ascorbic Acid (VITAMIN C) 500 MG CAPS Take 500 mg by mouth daily.  ? aspirin 81 MG chewable tablet Chew 1 tablet by mouth daily.  ? Cholecalciferol (VITAMIN D) 50 MCG (2000 UT) tablet Take 2,000 Units by mouth daily.  ? CREON 12000-38000 units CPEP capsule Take 12,000 Units by mouth 3 (three) times daily before meals.  ? empagliflozin (JARDIANCE) 10  MG TABS tablet Take 10 mg by mouth daily.  ? Melatonin 10 MG TABS Take 10 mg by mouth at bedtime as needed (sleep).  ? ONETOUCH VERIO test strip daily.  ? Probiotic Product (ALIGN) 4 MG CAPS Take 4 mg by mouth daily.  ? sucralfate (CARAFATE) 1 g tablet Take 1 g by mouth 2 (two) times daily.  ? vitamin E 180 MG (400 UNITS) capsule Take 400 Units by mouth daily.  ? zinc gluconate 50 MG tablet Take 50 mg by mouth daily.  ?  ? ?Allergies  ?Allergen Reactions  ? Ibuprofen Hives  ? ? ?Social History  ? ?Socioeconomic History  ? Marital status: Widowed  ?  Spouse name: Not on file  ? Number of children: 1  ? Years of education: Not on file  ? Highest education level: Not on file  ?Occupational History  ? Occupation: accounting  ?  Comment: retired  ?Tobacco Use  ? Smoking status: Never  ? Smokeless tobacco: Never  ?Vaping Use  ? Vaping Use: Never used  ?Substance and Sexual Activity  ? Alcohol use: No  ? Drug use: No  ? Sexual activity: Not Currently  ?  Birth control/protection: Surgical  ?Other Topics Concern  ? Not on file  ?Social History Narrative  ? ** Merged History Encounter **  ?    ? ?Social Determinants of Health  ? ?Financial Resource Strain: Not on file  ?Food Insecurity: Not on file  ?Transportation Needs: Not  on file  ?Physical Activity: Not on file  ?Stress: Not on file  ?Social Connections: Not on file  ?Intimate Partner Violence: Not on file  ?  ? ?Review of Systems: ?General: negative for chills, fever, night sweats or weight changes.  ?Cardiovascular: negative for chest pain, dyspnea on exertion, edema, orthopnea, palpitations, paroxysmal nocturnal dyspnea or shortness of breath ?Dermatological: negative for rash ?Respiratory: negative for cough or wheezing ?Urologic: negative for hematuria ?Abdominal: negative for nausea, vomiting, diarrhea, bright red blood per rectum, melena, or hematemesis ?Neurologic: negative for visual changes, syncope, or dizziness ?All other systems reviewed and are otherwise  negative except as noted above. ? ? ? ?Blood pressure 126/60, pulse 68, height 5\' 5"  (1.651 m), weight 106 lb (48.1 kg), SpO2 98 %.  ?General appearance: alert and no distress ?Neck: no adenopathy, no carotid bruit, no JVD, supple, symmetrical, trachea midline, and thyroid not enlarged, symmetric, no tenderness/mass/nodules ?Lungs: clear to auscultation bilaterally ?Heart: regular rate and rhythm, S1, S2 normal, no murmur, click, rub or gallop ?Extremities: extremities normal, atraumatic, no cyanosis or edema ?Pulses: 2+ and symmetric ?Skin: Skin color, texture, turgor normal. No rashes or lesions ?Neurologic: Grossly normal ? ?EKG sinus rhythm at 68 with poor R wave progression.  I personally reviewed this EKG. ? ?ASSESSMENT AND PLAN:  ? ?Chest pain ?History of exertional chest pressure over the last several months.  She did have a low risk nonischemic Myoview which I ordered for atypical chest pain 05/16/2016.  I am going to get a coronary CTA to further evaluate. ? ?Hyperlipidemia ?History of hyperlipidemia not on statin therapy with lipid profile performed 05/03/2020 revealing total cholesterol 161, LDL of 42 and HDL 55. ? ?Essential hypertension ?History of essential hypertension blood pressure measured today at 126/60.  She is not on antihypertensive medications. ? ?Dyspnea on exertion ?[name] has had progressive dyspnea on exertion.  She had a VATS procedure performed Dr. Roxan Hockey 03/09/2021 of her left upper lobe because of a nodule that turned out to be adenocarcinoma.  She then developed RSV pneumonia in December and was hospitalized for 10 days and later had 2 weeks of rehab.  Since that time she had progressive dyspnea on exertion.  I am going to get a 2D echo to further evaluate. ? ? ? ? ?Lorretta Harp MD FACP,FACC,FAHA, FSCAI ?08/23/2021 ?11:09 AM ?

## 2021-08-24 ENCOUNTER — Encounter: Payer: Self-pay | Admitting: *Deleted

## 2021-08-24 LAB — BASIC METABOLIC PANEL
BUN/Creatinine Ratio: 28 (ref 12–28)
BUN: 23 mg/dL (ref 8–27)
CO2: 18 mmol/L — ABNORMAL LOW (ref 20–29)
Calcium: 9.5 mg/dL (ref 8.7–10.3)
Chloride: 98 mmol/L (ref 96–106)
Creatinine, Ser: 0.81 mg/dL (ref 0.57–1.00)
Glucose: 194 mg/dL — ABNORMAL HIGH (ref 70–99)
Potassium: 3.9 mmol/L (ref 3.5–5.2)
Sodium: 139 mmol/L (ref 134–144)
eGFR: 74 mL/min/{1.73_m2} (ref >59)

## 2021-09-01 ENCOUNTER — Telehealth (HOSPITAL_COMMUNITY): Payer: Self-pay | Admitting: *Deleted

## 2021-09-01 NOTE — Telephone Encounter (Signed)
Reaching out to patient to offer assistance regarding upcoming cardiac imaging study; pt verbalizes understanding of appt date/time, parking situation and where to check in, pre-test NPO status and medications ordered, and verified current allergies; name and call back number provided for further questions should they arise ? ?Gordy Clement RN Navigator Cardiac Imaging ?[facility] ?[phone] office ?[phone] cell ? ?Patient to take 50mg  metoprolol tartrate two hours prior to her cardiac CT scan. She is aware to arrive at 8:30am.  ?

## 2021-09-04 ENCOUNTER — Other Ambulatory Visit: Payer: Self-pay | Admitting: *Deleted

## 2021-09-04 ENCOUNTER — Other Ambulatory Visit (HOSPITAL_COMMUNITY): Payer: Self-pay | Admitting: Cardiovascular Disease

## 2021-09-04 ENCOUNTER — Telehealth: Payer: Self-pay | Admitting: *Deleted

## 2021-09-04 ENCOUNTER — Encounter (HOSPITAL_COMMUNITY): Payer: Self-pay

## 2021-09-04 ENCOUNTER — Ambulatory Visit (HOSPITAL_COMMUNITY)
Admission: RE | Admit: 2021-09-04 | Discharge: 2021-09-04 | Disposition: A | Discharge status: Home / Self Care | Payer: PPO | Source: Ambulatory Visit | Attending: Internal Medicine | Admitting: Internal Medicine

## 2021-09-04 ENCOUNTER — Ambulatory Visit (HOSPITAL_COMMUNITY)
Admission: RE | Admit: 2021-09-04 | Discharge: 2021-09-04 | Disposition: A | Discharge status: Home / Self Care | Payer: PPO | Source: Ambulatory Visit | Attending: Cardiovascular Disease | Admitting: Cardiovascular Disease

## 2021-09-04 DIAGNOSIS — R931 Abnormal findings on diagnostic imaging of heart and coronary circulation: Secondary | ICD-10-CM

## 2021-09-04 DIAGNOSIS — Z789 Other specified health status: Secondary | ICD-10-CM

## 2021-09-04 DIAGNOSIS — R071 Chest pain on breathing: Secondary | ICD-10-CM

## 2021-09-04 DIAGNOSIS — R072 Precordial pain: Secondary | ICD-10-CM | POA: Diagnosis not present

## 2021-09-04 DIAGNOSIS — I2510 Atherosclerotic heart disease of native coronary artery without angina pectoris: Secondary | ICD-10-CM

## 2021-09-04 MED ORDER — NITROGLYCERIN 0.4 MG SL SUBL: 0.8000 mg | SUBLINGUAL_TABLET | Freq: Once | SUBLINGUAL | Status: AC

## 2021-09-04 MED ORDER — NITROGLYCERIN 0.4 MG SL SUBL
SUBLINGUAL_TABLET | SUBLINGUAL | Status: AC
  Administered 2021-09-04: 0.8 mg via SUBLINGUAL
  Filled 2021-09-04: qty 1

## 2021-09-04 MED ORDER — IOHEXOL 350 MG/ML SOLN
100.0000 mL | Freq: Once | INTRAVENOUS | Status: AC | PRN
Start: 2021-09-04 — End: 2021-09-04
  Administered 2021-09-04: 100 mL via INTRAVENOUS

## 2021-09-04 MED ORDER — SODIUM CHLORIDE 0.9% FLUSH: 3.0000 mL | Freq: Two times a day (BID) | INTRAVENOUS | Status: AC

## 2021-09-04 MED ORDER — ATORVASTATIN CALCIUM 20 MG PO TABS: 20.0000 mg | ORAL_TABLET | Freq: Every day | ORAL | 1 refills | Status: DC

## 2021-09-04 MED ORDER — METOPROLOL SUCCINATE ER 25 MG PO TB24: 25.0000 mg | ORAL_TABLET | Freq: Every day | ORAL | 1 refills | Status: DC

## 2021-09-04 NOTE — Telephone Encounter (Signed)
Spoke with the patient about her results. She has agreed to have the heart cath which has been scheduled for 4/12 at 1:00 pm with Dr. Burt Knack. Instructions have been given.  ? ?Per Dr. Sallyanne Kuster the patient should be started on Atorvastatin 20 mg once daily and Metoprolol Succinate 25 mg once daily. She has been taking her Aspirin.  ? ?You are scheduled for a Cardiac Catheterization on Wednesday, April 12 with Dr. Burt Knack ? ?1. Please arrive at the Main Entrance A at [facility]: [place], [place] [address] at 11:00 am (This time is two hours before your procedure to ensure your preparation). Free valet parking service is available.  ? ?Special note: Every effort is made to have your procedure done on time. Please understand that emergencies sometimes delay scheduled procedures. ? ?2. Diet: Do not eat solid foods after midnight.  You may have clear liquids until 5 AM upon the day of the procedure. ? ?3. Labs: You will need to have blood drawn on the day of the procedure (CBC) ? ?4. Medication instructions in preparation for your procedure: ?Nothing to hold. ? ?On the morning of your procedure, take Aspirin and any morning medicines NOT listed above.  You may use sips of water. ? ?5. Plan to go home the same day, you will only stay overnight if medically necessary. ?6. You MUST have a responsible adult to drive you home. ?7. An adult MUST be with you the first 24 hours after you arrive home. ?8. Bring a current list of your medications, and the last time and date medication taken. ?9. Bring ID and current insurance cards. ?10.Please wear clothes that are easy to get on and off and wear slip-on shoes. ? ?Thank you for allowing [place] to care for you! ?  -- [facility] ? ? ?

## 2021-09-04 NOTE — Telephone Encounter (Signed)
-----   Message from Sanda Klein, MD sent at 09/04/2021  4:26 PM EDT ----- ?Unfortunately the CT shows widespread coronary plaque. Since she is symptomatic, next best step is a heart cath. ?Can we please set up for heart catheterization with Dr. Gwenlyn Found on his next available cath day? ?Mahesh, any reason to send for FFR or just straight to cath? ?

## 2021-09-05 NOTE — H&P (View-Only) (Signed)
Had a phone conversation with [name] regarding the findings on the coronary CT angiogram, her symptoms and the recommendation for cardiac catheterization. ? ?Her symptoms sound fairly convincing for angina pectoris and she has a very high calcium score, particularly in the LAD artery.  Although CT FFR does not show values consistent with significant obstruction, I am concerned that we may be underestimating the severity of disease, particularly in the mid LAD. ? ?Recommended treatment with statin, beta-blocker, aspirin.  She was taking simvastatin and metoprolol up to her lung surgery several months ago, without side effects other than fatigue.  Retrospectively, she is not sure whether her fatigue was related to simvastatin or metoprolol.  She tolerated metoprolol 50 mg for the CT angiogram without any significant bradycardia or side effects. ? ?Reviewed the purpose of cardiac catheterization, procedure details, potential outcomes and complications.  Reviewed the possibly that she may need angioplasty-stent followed by treatment with antiplatelet therapy.  Addressed her concerns regarding the fact that her "veins are small", with difficulty with IV access and phlebotomy in the past.  She is also concerned about her history of DVT, but this occurred following bedrest after a Whipple procedure more than 10 years ago.  She was tested for hypercoagulable states and none were identified. ? ?This procedure has been fully reviewed with the patient and informed consent has been obtained. ? ?

## 2021-09-05 NOTE — Progress Notes (Signed)
Had a phone conversation with [name] regarding the findings on the coronary CT angiogram, her symptoms and the recommendation for cardiac catheterization. ? ?Her symptoms sound fairly convincing for angina pectoris and she has a very high calcium score, particularly in the LAD artery.  Although CT FFR does not show values consistent with significant obstruction, I am concerned that we may be underestimating the severity of disease, particularly in the mid LAD. ? ?Recommended treatment with statin, beta-blocker, aspirin.  She was taking simvastatin and metoprolol up to her lung surgery several months ago, without side effects other than fatigue.  Retrospectively, she is not sure whether her fatigue was related to simvastatin or metoprolol.  She tolerated metoprolol 50 mg for the CT angiogram without any significant bradycardia or side effects. ? ?Reviewed the purpose of cardiac catheterization, procedure details, potential outcomes and complications.  Reviewed the possibly that she may need angioplasty-stent followed by treatment with antiplatelet therapy.  Addressed her concerns regarding the fact that her "veins are small", with difficulty with IV access and phlebotomy in the past.  She is also concerned about her history of DVT, but this occurred following bedrest after a Whipple procedure more than 10 years ago.  She was tested for hypercoagulable states and none were identified. ? ?This procedure has been fully reviewed with the patient and informed consent has been obtained. ? ?

## 2021-09-06 ENCOUNTER — Encounter (HOSPITAL_COMMUNITY)
Admission: RE | Disposition: A | Discharge status: Home / Self Care | Payer: Self-pay | Source: Home / Self Care | Attending: Cardiovascular Disease

## 2021-09-06 ENCOUNTER — Ambulatory Visit (HOSPITAL_BASED_OUTPATIENT_CLINIC_OR_DEPARTMENT_OTHER): Payer: PPO

## 2021-09-06 ENCOUNTER — Ambulatory Visit (HOSPITAL_COMMUNITY): Payer: PPO

## 2021-09-06 ENCOUNTER — Other Ambulatory Visit: Payer: Self-pay

## 2021-09-06 ENCOUNTER — Ambulatory Visit (HOSPITAL_COMMUNITY)
Admission: RE | Admit: 2021-09-06 | Discharge: 2021-09-06 | Disposition: A | Discharge status: Home / Self Care | Payer: PPO | Attending: Cardiovascular Disease | Admitting: Cardiovascular Disease

## 2021-09-06 DIAGNOSIS — R931 Abnormal findings on diagnostic imaging of heart and coronary circulation: Secondary | ICD-10-CM | POA: Diagnosis present

## 2021-09-06 DIAGNOSIS — I2510 Atherosclerotic heart disease of native coronary artery without angina pectoris: Secondary | ICD-10-CM | POA: Diagnosis not present

## 2021-09-06 DIAGNOSIS — R071 Chest pain on breathing: Secondary | ICD-10-CM

## 2021-09-06 DIAGNOSIS — Z8249 Family history of ischemic heart disease and other diseases of the circulatory system: Secondary | ICD-10-CM | POA: Insufficient documentation

## 2021-09-06 DIAGNOSIS — I2584 Coronary atherosclerosis due to calcified coronary lesion: Secondary | ICD-10-CM | POA: Insufficient documentation

## 2021-09-06 DIAGNOSIS — I10 Essential (primary) hypertension: Secondary | ICD-10-CM | POA: Insufficient documentation

## 2021-09-06 DIAGNOSIS — E785 Hyperlipidemia, unspecified: Secondary | ICD-10-CM | POA: Diagnosis not present

## 2021-09-06 DIAGNOSIS — R0609 Other forms of dyspnea: Secondary | ICD-10-CM | POA: Diagnosis not present

## 2021-09-06 HISTORY — PX: LEFT HEART CATH AND CORONARY ANGIOGRAPHY: CATH118249

## 2021-09-06 LAB — ECHOCARDIOGRAM COMPLETE
AV Peak grad: 6 mmHg
Ao pk vel: 1.23 m/s
Area-P 1/2: 6.96 cm2
Height: 65 in
P 1/2 time: 488 msec
S' Lateral: 3.3 cm
Weight: 1776 oz

## 2021-09-06 LAB — CBC
HCT: 41.3 % (ref 36.0–46.0)
Hemoglobin: 13.5 g/dL (ref 12.0–15.0)
MCH: 30.5 pg (ref 26.0–34.0)
MCHC: 32.7 g/dL (ref 30.0–36.0)
MCV: 93.4 fL (ref 80.0–100.0)
Platelets: 206 10*3/uL (ref 150–400)
RBC: 4.42 MIL/uL (ref 3.87–5.11)
RDW: 15.5 % (ref 11.5–15.5)
WBC: 4.6 10*3/uL (ref 4.0–10.5)
nRBC: 0 % (ref 0.0–0.2)

## 2021-09-06 LAB — GLUCOSE, CAPILLARY: Glucose-Capillary: 138 mg/dL — ABNORMAL HIGH (ref 70–99)

## 2021-09-06 SURGERY — LEFT HEART CATH AND CORONARY ANGIOGRAPHY
Anesthesia: LOCAL

## 2021-09-06 MED ORDER — MIDAZOLAM HCL 2 MG/2ML IJ SOLN
INTRAMUSCULAR | Status: DC | PRN
Start: 2021-09-06 — End: 2021-09-06
  Administered 2021-09-06: 2 mg via INTRAVENOUS

## 2021-09-06 MED ORDER — SODIUM CHLORIDE 0.9 % IV SOLN: 250.0000 mL | INTRAVENOUS | Status: DC | PRN

## 2021-09-06 MED ORDER — ASPIRIN 81 MG PO CHEW
81.0000 mg | CHEWABLE_TABLET | ORAL | Status: DC
  Filled 2021-09-06: qty 1

## 2021-09-06 MED ORDER — FENTANYL CITRATE (PF) 100 MCG/2ML IJ SOLN
INTRAMUSCULAR | Status: DC | PRN
  Administered 2021-09-06: 25 ug via INTRAVENOUS

## 2021-09-06 MED ORDER — MIDAZOLAM HCL 2 MG/2ML IJ SOLN
INTRAMUSCULAR | Status: AC
  Filled 2021-09-06: qty 2

## 2021-09-06 MED ORDER — LIDOCAINE HCL (PF) 1 % IJ SOLN
INTRAMUSCULAR | Status: DC | PRN
  Administered 2021-09-06: 2 mL

## 2021-09-06 MED ORDER — LABETALOL HCL 5 MG/ML IV SOLN: 10.0000 mg | INTRAVENOUS | Status: DC | PRN

## 2021-09-06 MED ORDER — SODIUM CHLORIDE 0.9 % WEIGHT BASED INFUSION: 1.0000 mL/kg/h | INTRAVENOUS | Status: DC

## 2021-09-06 MED ORDER — HEPARIN (PORCINE) IN NACL 1000-0.9 UT/500ML-% IV SOLN
INTRAVENOUS | Status: DC | PRN
  Administered 2021-09-06 (×2): 500 mL

## 2021-09-06 MED ORDER — SODIUM CHLORIDE 0.9% FLUSH: 3.0000 mL | INTRAVENOUS | Status: DC | PRN

## 2021-09-06 MED ORDER — HEPARIN (PORCINE) IN NACL 1000-0.9 UT/500ML-% IV SOLN
INTRAVENOUS | Status: AC
  Filled 2021-09-06: qty 1000

## 2021-09-06 MED ORDER — FENTANYL CITRATE (PF) 100 MCG/2ML IJ SOLN
INTRAMUSCULAR | Status: AC
Start: 2021-09-06 — End: ?
  Filled 2021-09-06: qty 2

## 2021-09-06 MED ORDER — ACETAMINOPHEN 325 MG PO TABS: 650.0000 mg | ORAL_TABLET | ORAL | Status: DC | PRN

## 2021-09-06 MED ORDER — HEPARIN SODIUM (PORCINE) 1000 UNIT/ML IJ SOLN
INTRAMUSCULAR | Status: AC
  Filled 2021-09-06: qty 10

## 2021-09-06 MED ORDER — SODIUM CHLORIDE 0.9 % WEIGHT BASED INFUSION
3.0000 mL/kg/h | INTRAVENOUS | Status: AC
  Administered 2021-09-06: 3 mL/kg/h via INTRAVENOUS

## 2021-09-06 MED ORDER — LIDOCAINE HCL (PF) 1 % IJ SOLN
INTRAMUSCULAR | Status: AC
  Filled 2021-09-06: qty 30

## 2021-09-06 MED ORDER — VERAPAMIL HCL 2.5 MG/ML IV SOLN
INTRAVENOUS | Status: AC
  Filled 2021-09-06: qty 2

## 2021-09-06 MED ORDER — IOHEXOL 350 MG/ML SOLN
INTRAVENOUS | Status: DC | PRN
  Administered 2021-09-06: 40 mL

## 2021-09-06 MED ORDER — HEPARIN (PORCINE) IN NACL 2-0.9 UNITS/ML
INTRAMUSCULAR | Status: DC | PRN
  Administered 2021-09-06: 10 mL via INTRA_ARTERIAL

## 2021-09-06 MED ORDER — ONDANSETRON HCL 4 MG/2ML IJ SOLN: 4.0000 mg | Freq: Four times a day (QID) | INTRAMUSCULAR | Status: DC | PRN

## 2021-09-06 MED ORDER — HEPARIN SODIUM (PORCINE) 1000 UNIT/ML IJ SOLN
INTRAMUSCULAR | Status: DC | PRN
  Administered 2021-09-06: 3000 [IU] via INTRAVENOUS

## 2021-09-06 MED ORDER — HYDRALAZINE HCL 20 MG/ML IJ SOLN: 10.0000 mg | INTRAMUSCULAR | Status: DC | PRN

## 2021-09-06 MED ORDER — ASPIRIN 81 MG PO CHEW
81.0000 mg | CHEWABLE_TABLET | ORAL | Status: AC
  Administered 2021-09-06: 81 mg via ORAL

## 2021-09-06 MED ORDER — SODIUM CHLORIDE 0.9% FLUSH: 3.0000 mL | Freq: Two times a day (BID) | INTRAVENOUS | Status: DC

## 2021-09-06 SURGICAL SUPPLY — 10 items
CATH 5FR JL3.5 JR4 ANG PIG MP (CATHETERS) ×1 IMPLANT
CATH INFINITI 6F ANG MULTIPACK (CATHETERS) IMPLANT
GLIDESHEATH SLEND SS 6F .021 (SHEATH) ×1 IMPLANT
GUIDEWIRE INQWIRE 1.5J.035X260 (WIRE) IMPLANT
INQWIRE 1.5J .035X260CM (WIRE) ×2
KIT HEART LEFT (KITS) ×2 IMPLANT
PACK CARDIAC CATHETERIZATION (CUSTOM PROCEDURE TRAY) ×2 IMPLANT
SYR MEDRAD MARK 7 150ML (SYRINGE) ×2 IMPLANT
TRANSDUCER W/STOPCOCK (MISCELLANEOUS) ×2 IMPLANT
TUBING CIL FLEX 10 FLL-RA (TUBING) ×2 IMPLANT

## 2021-09-06 NOTE — Interval H&P Note (Signed)
History and Physical Interval Note: ? ?09/06/2021 ?1:26 PM ? ?[name]  has presented today for surgery, with the diagnosis of abnormal ct.  The various methods of treatment have been discussed with the patient and family. After consideration of risks, benefits and other options for treatment, the patient has consented to  Procedure(s): ?LEFT HEART CATH AND CORONARY ANGIOGRAPHY (N/A) as a surgical intervention.  The patient's history has been reviewed, patient examined, no change in status, stable for surgery.  I have reviewed the patient's chart and labs.  Questions were answered to the patient's satisfaction.   ? ? ?[name] ? ? ?

## 2021-09-06 NOTE — Interval H&P Note (Signed)
History and Physical Interval Note: ? ?09/06/2021 ?1:24 PM ? ?[name]  has presented today for surgery, with the diagnosis of abnormal ct.  The various methods of treatment have been discussed with the patient and family. After consideration of risks, benefits and other options for treatment, the patient has consented to  Procedure(s): ?LEFT HEART CATH AND CORONARY ANGIOGRAPHY (N/A) as a surgical intervention.  The patient's history has been reviewed, patient examined, no change in status, stable for surgery.  I have reviewed the patient's chart and labs.  Questions were answered to the patient's satisfaction.   ? ? ?[name] ? ? ?

## 2021-09-07 ENCOUNTER — Encounter (HOSPITAL_COMMUNITY): Payer: Self-pay | Admitting: Cardiovascular Disease

## 2021-09-12 ENCOUNTER — Ambulatory Visit: Payer: PPO | Admitting: Thoracic Surgery (Cardiothoracic Vascular Surgery)

## 2021-10-03 ENCOUNTER — Ambulatory Visit: Payer: PPO | Admitting: Thoracic Surgery (Cardiothoracic Vascular Surgery)

## 2021-10-11 ENCOUNTER — Telehealth: Payer: Self-pay | Admitting: *Deleted

## 2021-10-11 NOTE — Progress Notes (Incomplete)
?Radiation Oncology         [phone] ?________________________________ ? ?Name: [name] MRN: [id number]  ?Date: 10/12/2021  DOB: [birth year] ? ?Follow-Up Visit Note ? ?CC: [name], MD  [name],* ? ?No diagnosis found. ? ?Diagnosis: Recurrent stage IA, grade 1 endometrial cancer; with new metastatic adenocarcinoma of the LUL ? ?Interval Since Last Radiation: 1 year, 1 month, and 19 days  ? ?Radiation Treatment Dates: 06/21/2020 through 08/25/2020 ?  ?Site: Vagina: pelvis ?Technique: IMRT ?Total Dose (Gy): 45/45 ?Dose per Fx (Gy): 1.8 ?Completed Fx: 25/25 ?Beam Energies: 6X ?  ?Site: Vagina: pelvis boost ?Technique: HDR-brachytherapy ?Total Dose (Gy): 24/24 ?Dose per Fx (Gy): 6 ?Completed Fx: 4/4 ?Beam Energies: Ir-192 ? ?Narrative:  The patient returns today for routine follow-up, she was last seen here for follow up on 02/13/21. To review, the patient had a PET scan performed 11/11/20 which showed a new hypermetabolic left upper lobe nodule. Chest CT on 01/28/21 showed the nodule to increase in size. ? ?Since her last visit,  the patient proceeded to undergo a robotic assisted left upper lobe segmentectomy, lymph node dissection, nerve block and left thoracotomy on 03/09/21 under the care of Dr. Roxan Hockey. Final pathology revealed findings consistent with metastatic adenocarcinoma of the LUL from endometrial carcinoma primary. Nodal status of 13/13 lymph nodes excisions (lymph nodes 7-13) negative for carcinoma. LLL wedge resection also performed showed benign lung parenchyma. Post op, she had paroxysmal atrial fibrillation. She was treated with Lopressor, Amiodarone, and Apixaban. ? ?During a post-op follow up with [facility] on 05/02/21, the patient reported cough with light yellow sputum and difficulty taking a deep breath. The patient was evaluated by Dr. Roxan Hockey during this visit who informed the patient that she would likely have several  more weeks of recovery. The patient was also noted to have stopped taking her Lopressor, Metformin, and Simvastatin. She was instructed to continue taking Amiodarone and Apixaban.  ? ?On 05/08/21, the patient presented to the ED for evaluation of weakness, cough, and dyspnea. She was found to have RSV.  CTA showed no acute pulmonary embolism with postsurgical changes of the left lung with resection and development of moderate left pleural effusion containing gas along the left upper lobe anteriorly. Findings were noted to possibly be postsurgical although bronchopleural fistula could not be excluded. CT also showed mediastinal and hilar lymphadenopathy which was possibly reactive in etiology. Her case was discussed with on-call CT surgery, who felt that findings were consistent with postoperative changes. She was started on empiric Zosyn and admitted to the hospital medicine service for further work-up and management of RSV.  She was later discharged on *** to rehab and spent 2 weeks there. ? ?The patient accordingly followed up with Dr. Berline Lopes on 07/21/21 to discuss her new metastatic disease. During this visit, the patient was noted to have ongoing significant fatigue, lack of appetite, and SOB at baseline and with ambulation from her recent RSV infection. The patient was also noted to have ongoing significant weakness and deconditioning from complications with surgery and her postoperative course. From a cancer standpoint, Dr. Berline Lopes recommended repeat imaging given that her last PET scan was done prior to her surgery.  In the setting of completely resected oligometastatic disease with negative margins, Dr. Berline Lopes would have considered antiestrogen treatment given her ER positive cancer.  However, due to concern that she may not tolerate further treatment, Dr. Berline Lopes advised close follow-up given her widely negative margins and no other evidence  on her preoperative imaging.  ? ?Repeat CT of the chest abdomen and  pelvis on 08/03/21 showed post-operative findings in the left lung, including a small left pleural effusion with enhancing pleural surfaces, and bulla or small chronic pneumothorax medially and superiorly along the remaining left upper lobe. CT also showed peripheral reticular interstitial accentuation with some faint ground-glass opacities in the right lung (technically nonspecific although atypical infectious process could not be excluded),  and a stable appearing faint micro nodularity peripherally in the right lung. Otherwise, no findings of new or active malignancy were identified. CT also noted coronary calcifications.  ? ?On 08/23/21, the patient met with Dr. Gwenlyn Found, [facility], for evaluation cardiovascular evaluation because of atypical chest pain. During this visit, the patient reported exertional chest pressure specifically, as well as progressive dyspnea. Accordingly, Dr. Gwenlyn Found ordered a coronary CTA for further evaluation. Coronary CTA on 09/04/21 revealed a coronary calcium score of 1474 (being in the 95th percentile for the patient's age, sex, and race), and CAD-RADS 4 severe stenosis (70-99% or > 50% left main). Subsequently, the patient underwent left heart catheterization and coronary angiography on 09/06/21 under Dr. Burt Knack.  ? ?Other imaging performed in the interval includes a routine bilateral screening mammogram on 07/21/21 which showed no evidence of malignancy in either breast. ?                       ? ?Allergies:  is allergic to ibuprofen. ? ?Meds: ?Current Outpatient Medications  ?Medication Sig Dispense Refill  ? Apoaequorin (PREVAGEN) 10 MG CAPS Take 10 mg by mouth daily.    ? Ascorbic Acid (VITAMIN C) 500 MG CAPS Take 500 mg by mouth daily.    ? aspirin EC 81 MG tablet Take 81 mg by mouth daily. Swallow whole.    ? atorvastatin (LIPITOR) 20 MG tablet Take 1 tablet (20 mg total) by mouth daily. 30 tablet 1  ? CREON 12000-38000 units CPEP capsule Take 12,000 Units by mouth 3 (three)  times daily before meals.    ? Cyanocobalamin (B-12 PO) Take 1 capsule by mouth daily.    ? empagliflozin (JARDIANCE) 10 MG TABS tablet Take 10 mg by mouth daily.    ? Melatonin 10 MG TABS Take 10 mg by mouth at bedtime as needed (sleep).    ? metoprolol succinate (TOPROL-XL) 25 MG 24 hr tablet Take 1 tablet (25 mg total) by mouth daily. Take with or immediately following a meal. 30 tablet 1  ? ONETOUCH VERIO test strip daily.    ? sucralfate (CARAFATE) 1 g tablet Take 1 g by mouth 2 (two) times daily.    ? VITAMIN D PO Take 1 capsule by mouth daily.    ? vitamin E 180 MG (400 UNITS) capsule Take 400 Units by mouth at bedtime.    ? zinc gluconate 50 MG tablet Take 50 mg by mouth 2 (two) times daily.    ? ?Current Facility-Administered Medications  ?Medication Dose Route Frequency Provider Last Rate Last Admin  ? sodium chloride flush (NS) 0.9 % injection 3 mL  3 mL Intravenous Q12H Croitoru, Mihai, MD      ? ? ?Physical Findings: ?The patient is in no acute distress. Patient is alert and oriented. ? vitals were not taken for this visit. .  No significant changes. Lungs are clear to auscultation bilaterally. Heart has regular rate and rhythm. No palpable cervical, supraclavicular, or axillary adenopathy. Abdomen soft, non-tender, normal bowel sounds. *** ? ? ?  Lab Findings: ?Lab Results  ?Component Value Date  ? WBC 4.6 09/06/2021  ? HGB 13.5 09/06/2021  ? HCT 41.3 09/06/2021  ? MCV 93.4 09/06/2021  ? PLT 206 09/06/2021  ? ? ?Radiographic Findings: ?No results found. ? ?Impression:  Recurrent stage IA, grade 1 endometrial cancer; with new metastatic adenocarcinoma of the LUL ? ?The patient is recovering from the effects of radiation.  *** ? ?Plan:  *** ? ? ?*** minutes of total time was spent for this patient encounter, including preparation, face-to-face counseling with the patient and coordination of care, physical exam, and documentation of the encounter. ?____________________________________ ? ?Blair Promise,  PhD, MD ? ?This document serves as a record of services personally performed by Gery Pray, MD. It was created on his behalf by Roney Mans, a trained medical scribe. The creation of this record is bas

## 2021-10-11 NOTE — Telephone Encounter (Signed)
RETURNED PATIENT'S PHONE CALL, SPOKE WITH PATIENT. ?

## 2021-10-12 ENCOUNTER — Ambulatory Visit
Admission: RE | Admit: 2021-10-12 | Discharge: 2021-10-12 | Disposition: A | Discharge status: Home / Self Care | Payer: PPO | Source: Ambulatory Visit | Attending: Radiation Oncology | Admitting: Radiation Oncology

## 2021-11-14 ENCOUNTER — Telehealth: Payer: Self-pay | Admitting: Cardiovascular Disease

## 2021-11-14 MED ORDER — ATORVASTATIN CALCIUM 20 MG PO TABS: 20.0000 mg | ORAL_TABLET | Freq: Every day | ORAL | 1 refills | Status: DC

## 2021-11-14 MED ORDER — METOPROLOL SUCCINATE ER 25 MG PO TB24: 25.0000 mg | ORAL_TABLET | Freq: Every day | ORAL | 1 refills | Status: DC

## 2021-11-14 NOTE — Telephone Encounter (Signed)
*  STAT* If patient is at the pharmacy, call can be transferred to refill team.   1. Which medications need to be refilled? (please list name of each medication and dose if known) atorvastatin (LIPITOR) 20 MG tablet  metoprolol succinate (TOPROL-XL) 25 MG 24 hr tablet  2. Which pharmacy/location (including street and city if local pharmacy) is medication to be sent to? [facility], [place] ([id number]  3. Do they need a 30 day or 90 day supply? [place]

## 2021-11-15 NOTE — Telephone Encounter (Signed)
Pt called back and said that her refill was sent to the wrong pharmacy it should be sent to [facility] [place], [place] ([address].  Pt states that she is completely out and will be in [place] till September, she needs a 90 day supply.

## 2022-01-08 ENCOUNTER — Telehealth: Payer: Self-pay | Admitting: *Deleted

## 2022-01-08 ENCOUNTER — Telehealth: Payer: Self-pay

## 2022-01-08 DIAGNOSIS — R12 Heartburn: Secondary | ICD-10-CM | POA: Diagnosis not present

## 2022-01-08 DIAGNOSIS — E119 Type 2 diabetes mellitus without complications: Secondary | ICD-10-CM | POA: Diagnosis not present

## 2022-01-08 DIAGNOSIS — Z86718 Personal history of other venous thrombosis and embolism: Secondary | ICD-10-CM | POA: Diagnosis not present

## 2022-01-08 DIAGNOSIS — C3492 Malignant neoplasm of unspecified part of left bronchus or lung: Secondary | ICD-10-CM | POA: Diagnosis not present

## 2022-01-08 DIAGNOSIS — N939 Abnormal uterine and vaginal bleeding, unspecified: Secondary | ICD-10-CM | POA: Diagnosis not present

## 2022-01-08 DIAGNOSIS — D6859 Other primary thrombophilia: Secondary | ICD-10-CM | POA: Diagnosis not present

## 2022-01-08 DIAGNOSIS — E1169 Type 2 diabetes mellitus with other specified complication: Secondary | ICD-10-CM | POA: Diagnosis not present

## 2022-01-08 DIAGNOSIS — C49A9 Gastrointestinal stromal tumor of other sites: Secondary | ICD-10-CM | POA: Diagnosis not present

## 2022-01-08 DIAGNOSIS — N3001 Acute cystitis with hematuria: Secondary | ICD-10-CM | POA: Diagnosis not present

## 2022-01-08 DIAGNOSIS — N1831 Chronic kidney disease, stage 3a: Secondary | ICD-10-CM | POA: Diagnosis not present

## 2022-01-08 DIAGNOSIS — Z86711 Personal history of pulmonary embolism: Secondary | ICD-10-CM | POA: Diagnosis not present

## 2022-01-08 NOTE — Telephone Encounter (Signed)
Patient called and stated that "I have had several UTI's with bleeding over the pass month and half. I have seen my PCP and she wants me to follow up with Dr Berline Lopes jut to make sure everything is ok. The bleeding comes and goes. Worse over the past three weeks. I am having to wear a depends due to not being able to always hold my urine. I am not soaking it with blood. The blood is light red watery with some clots. The blood size is about a couple quarters. I have worse pain with urination. I have had no new surgeries and nothing in the vagina. Nothing makes it better, the pain is constant. I have no bloating, fullness, chills/fevers or N/V."   Pharmacy verified. Explained that the message would be given to Dr Chancy Hurter APP  and the office would cal her back later today.

## 2022-01-08 NOTE — Telephone Encounter (Signed)
Told [name] that Carol John, NP recommends that she see [name] so he can evaluate if bleeding is related to radiation changes.  She can see [name] 3 months after [name] visit.  [name] states that she saw her PCP this am and she did lab work and a urinalysis.  No referral to Urology at this point. Awaiting urine results from today

## 2022-01-08 NOTE — Telephone Encounter (Signed)
Called patient to ask about coming in for a follow-up appt. on 01-11-22, patient agreed to come on 01-11-22 @ 8 am

## 2022-01-08 NOTE — Telephone Encounter (Signed)
Scheduled follow up visit with Dr. Berline Lopes 3 months after appointment with Dr. Sondra Come on 01-11-22 per Joylene John, NP. Appointment is for 04-10-22 at 2 pm. Pt verbalized understanding.

## 2022-01-10 NOTE — Progress Notes (Signed)
Radiation Oncology         [phone] ________________________________  Name: [name] MRN: [id number]  Date: 01/11/2022  DOB: 09-01-1940  Follow-Up Visit Note  CC: [name], MD  [name],*  No diagnosis found.  Diagnosis: Recurrent stage IA, grade 1 endometrial cancer; with left upper lobe lung nodule, now determined as biopsy proven metastatic adenocarcinoma from endometrial cancer primary  Interval Since Last Radiation: 1 year, 4 months, and 17 days   Radiation Treatment Dates: 06/21/2020 through 08/25/2020   Site: Vagina: pelvis Technique: IMRT Total Dose (Gy): 45/45 Dose per Fx (Gy): 1.8 Completed Fx: 25/25 Beam Energies: 6X   Site: Vagina: pelvis boost Technique: HDR-brachytherapy Total Dose (Gy): 24/24 Dose per Fx (Gy): 6 Completed Fx: 4/4 Beam Energies: Ir-192  Narrative:  The patient returns today for routine follow-up, she was last seen here for follow up on 02/13/2021. Since her last visit, the patient underwent trisegmentectomy with nodal biopsies on 03/09/21 under the care of Dr. Roxan Hockey. This was prompted due to findings of an enlarging left upper lobe nodule noted on PET scan performed this past September 2022. Pathology from the procedure revealed metastatic adenocarcinoma, consistent with patient's clinical history of primary endometrial adenocarcinoma. Nodal status of 13/13 levels 7-13 lymph node excisions negative for carcinoma.      The patient was also hospitalized on 05/08/21 through 05/13/21 with RSV. On presentation to the ED, the patient was afebrile with SPO2 in the upper 80s. She was placed on 2 L supplemental oxygen with improvement in SPO2. CTA performed showed no acute pulmonary embolism with postsurgical changes of the left lung with resection and development of moderate left pleural effusion containing gas along with left upper lobe anteriorly, noted to be possibly postsurgical although bronchopleural  fistula could not be excluded. Case was discussed with on-call CT surgery, who felt that findings were consistent with postoperative changes. She was accordingly started on empiric Zosyn and admitted to the hospital medicine service for further work-up and management. She continued on with empiric Zosyn until diagnosis of RSV where empiric antibiotic therapy was discontinued given positive RPP along with negative procalcitonin.      The patient most recently followed up with Dr. Berline Lopes on 07/21/21. During which time, the patient reported significant fatigue and shortness of breath at baseline and with ambulation. She also endorsed a 25 pound unintentional weight loss due to decreased appetite since October 2022, weakness in her voice, a pressure sensation in her chest since surgery, and urinary incontinence. Otherwise, the patient denied any symptoms concerning for endometrial cancer recurrence, and was noted as NED on pelvic examination.    The patient also reported tightness/pressure in her chest during a follow up visit with Dr. Roxan Hockey on 08/01/21. She also detailed this as aggravated by exertion. On examination, the patient was noted to have lost a tremendous amount of weight since her surgery. Dr. Roxan Hockey reviewed a recent CXR during this visit which did not show findings that would contribute to dyspnea. Her degree of dyspnea was also noted as out of proportion to the amount of lung that was resected. The patient also voiced concerns that amiodarone and apixaban were negatively impacting her respiratory status. Dr. Roxan Hockey stated that amiodarone could potentially induce pulmonary toxicity, and agreed to stop amiodarone since she was 6 months out from surgery. The patient also opted to stop apixaban despite the risks. For her chest tightness, Dr. Roxan Hockey placed a referral to cardiology for further evaluation.    CT  of the chest abdomen and pelvis on 08/03/21 demonstrated: post-op findings  in the left lung; a small left pleural effusion with enhancing pleural surfaces; bulla or small chronic pneumothorax medially and superiorly along the remaining left upper lobe; peripheral reticular interstitial accentuation with some faint ground-glass opacities in the right lung; and a faint micro nodularity peripherally in the right lung (chronically stable and likely postinflammatory). Otherwise, no findings of new or active malignancy were appreciated. (CT did note coronary calcifications).   The patient was then referred to cardiology and underwent left heart cath and coronary angiography on 09/06/21.   Recently, the patient called  Dr.Tucker's office on 01/08/22 with reports of vaginal bleeding. Patient was concerned that the bleeding may be related to radiation changes. She also met with her PCP that morning who ordered urinalysis and labs.   Other imaging performed in the interval includes a bilateral screening mammogram on 07/21/21 which showed no evidence of malignancy in either breast.   Allergies:  is allergic to ibuprofen.  Meds: Current Outpatient Medications  Medication Sig Dispense Refill   Apoaequorin (PREVAGEN) 10 MG CAPS Take 10 mg by mouth daily.     Ascorbic Acid (VITAMIN C) 500 MG CAPS Take 500 mg by mouth daily.     aspirin EC 81 MG tablet Take 81 mg by mouth daily. Swallow whole.     atorvastatin (LIPITOR) 20 MG tablet Take 1 tablet (20 mg total) by mouth daily. 30 tablet 1   CREON 12000-38000 units CPEP capsule Take 12,000 Units by mouth 3 (three) times daily before meals.     Cyanocobalamin (B-12 PO) Take 1 capsule by mouth daily.     empagliflozin (JARDIANCE) 10 MG TABS tablet Take 10 mg by mouth daily.     Melatonin 10 MG TABS Take 10 mg by mouth at bedtime as needed (sleep).     metoprolol succinate (TOPROL-XL) 25 MG 24 hr tablet Take 1 tablet (25 mg total) by mouth daily. Take with or immediately following a meal. 30 tablet 1   ONETOUCH VERIO test strip daily.      sucralfate (CARAFATE) 1 g tablet Take 1 g by mouth 2 (two) times daily.     VITAMIN D PO Take 1 capsule by mouth daily.     vitamin E 180 MG (400 UNITS) capsule Take 400 Units by mouth at bedtime.     zinc gluconate 50 MG tablet Take 50 mg by mouth 2 (two) times daily.     Current Facility-Administered Medications  Medication Dose Route Frequency Provider Last Rate Last Admin   sodium chloride flush (NS) 0.9 % injection 3 mL  3 mL Intravenous Q12H Croitoru, Mihai, MD        Physical Findings: The patient is in no acute distress. Patient is alert and oriented.  vitals were not taken for this visit. .  No significant changes. Lungs are clear to auscultation bilaterally. Heart has regular rate and rhythm. No palpable cervical, supraclavicular, or axillary adenopathy. Abdomen soft, non-tender, normal bowel sounds.  On pelvic examination the external genitalia were unremarkable. A speculum exam was performed. There are no mucosal lesions noted in the vaginal vault. A Pap smear was obtained of the proximal vagina. On bimanual and rectovaginal examination there were no pelvic masses appreciated. ***    Lab Findings: Lab Results  Component Value Date   WBC 4.6 09/06/2021   HGB 13.5 09/06/2021   HCT 41.3 09/06/2021   MCV 93.4 09/06/2021   PLT 206 09/06/2021  Radiographic Findings: No results found.  Impression:  Recurrent stage IA, grade 1 endometrial cancer; with left upper lobe lung nodule, now determined as biopsy proven metastatic adenocarcinoma from endometrial cancer primary  The patient is recovering from the effects of radiation.  ***  Plan:  ***   *** minutes of total time was spent for this patient encounter, including preparation, face-to-face counseling with the patient and coordination of care, physical exam, and documentation of the encounter. ____________________________________  Blair Promise, PhD, MD  This document serves as a record of services personally  performed by Gery Pray, MD. It was created on his behalf by Roney Mans, a trained medical scribe. The creation of this record is based on the scribe's personal observations and the provider's statements to them. This document has been checked and approved by the attending provider.

## 2022-01-11 ENCOUNTER — Ambulatory Visit
Admission: RE | Admit: 2022-01-11 | Discharge: 2022-01-11 | Disposition: A | Discharge status: Home / Self Care | Payer: PPO | Source: Ambulatory Visit | Attending: Radiation Oncology | Admitting: Radiation Oncology

## 2022-01-11 ENCOUNTER — Other Ambulatory Visit: Payer: Self-pay

## 2022-01-11 ENCOUNTER — Encounter: Payer: Self-pay | Admitting: Radiation Oncology

## 2022-01-11 VITALS — BP 130/48 | HR 52 | Temp 98.1°F | Resp 18 | Ht 65.0 in | Wt 116.1 lb

## 2022-01-11 DIAGNOSIS — Z7984 Long term (current) use of oral hypoglycemic drugs: Secondary | ICD-10-CM | POA: Diagnosis not present

## 2022-01-11 DIAGNOSIS — C541 Malignant neoplasm of endometrium: Secondary | ICD-10-CM | POA: Insufficient documentation

## 2022-01-11 DIAGNOSIS — Z923 Personal history of irradiation: Secondary | ICD-10-CM | POA: Diagnosis not present

## 2022-01-11 DIAGNOSIS — C7802 Secondary malignant neoplasm of left lung: Secondary | ICD-10-CM | POA: Insufficient documentation

## 2022-01-11 DIAGNOSIS — Z79899 Other long term (current) drug therapy: Secondary | ICD-10-CM | POA: Diagnosis not present

## 2022-01-11 DIAGNOSIS — R32 Unspecified urinary incontinence: Secondary | ICD-10-CM | POA: Insufficient documentation

## 2022-01-11 NOTE — Progress Notes (Signed)
[name] is here today for follow up post radiation to the pelvic.  They completed their radiation on: 08/25/20   Does the patient complain of any of the following:  Pain: Patient reports pain with urination.  Abdominal bloating: No Diarrhea/Constipation: No Nausea/Vomiting: No Vaginal Discharge:Yes Blood in Urine or Stool: Yes, patient has blood in urine.  Urinary Issues (dysuria/incomplete emptying/ incontinence/ increased frequency/urgency): Patient currently on day 2 of Cipro due to UTI. Patient also reports incontinence.  Does patient report using vaginal dilator 2-3 times a week and/or sexually active 2-3 weeks: No, patient states she had pain when using vaginal dilators. Patient states she thinks her vagina has started to close.  Post radiation skin changes: Yes, patient states she has severe irration to groin.    Additional comments if applicable:    BP (!) 975/88 (BP Location: Left Arm, Patient Position: Sitting)   Pulse (!) 52   Temp 98.1 F (36.7 C) (Oral)   Resp 18   Ht 5\' 5"  (1.651 m)   Wt 116 lb 2 oz (52.7 kg)   SpO2 99%   BMI 19.32 kg/m

## 2022-01-16 ENCOUNTER — Telehealth: Payer: Self-pay | Admitting: Oncology

## 2022-01-16 NOTE — Telephone Encounter (Signed)
Left a message regarding PT closer to her home.  Requested a return call.

## 2022-01-16 NOTE — Telephone Encounter (Signed)
Hendel called back and said that she is not able to travel to [facility] for pelvic floor PT.  She is wondering if there is a handout of exorcises that she could get.  Advised her that I will check and call her back.  Also discussed that estrogen cream or a pill is not recommended because her cancer was ER positive.  Discussed using a peri bottle to rinse and to use a barrier cream like Vaseline or diaper cream and that she can also use a vaginal moisturizer.  She verbalized understanding and agreement of recommendations.

## 2022-01-17 ENCOUNTER — Telehealth: Payer: Self-pay | Admitting: *Deleted

## 2022-01-17 NOTE — Telephone Encounter (Signed)
Patient called and requesting to move her follow up appt to a morning appt so that her daughter can come to the appt. Patient's daughter works in the afternoon. Explained that the office will speak with Dr [name] and call her back.

## 2022-01-19 NOTE — Telephone Encounter (Signed)
Patient's appt moved to 11/3 at 10:45 am, patient aware

## 2022-02-14 DIAGNOSIS — U071 COVID-19: Secondary | ICD-10-CM | POA: Diagnosis not present

## 2022-02-14 DIAGNOSIS — J029 Acute pharyngitis, unspecified: Secondary | ICD-10-CM | POA: Diagnosis not present

## 2022-02-21 ENCOUNTER — Ambulatory Visit: Payer: PPO | Attending: Cardiovascular Disease | Admitting: Cardiovascular Disease

## 2022-03-28 ENCOUNTER — Encounter: Payer: Self-pay | Admitting: Gynecologic Oncology

## 2022-03-30 ENCOUNTER — Telehealth: Payer: Self-pay

## 2022-03-30 ENCOUNTER — Telehealth: Payer: Self-pay | Admitting: Surgery

## 2022-03-30 ENCOUNTER — Inpatient Hospital Stay: Payer: PPO | Attending: Gynecologic Oncology | Admitting: Gynecologic Oncology

## 2022-03-30 ENCOUNTER — Encounter: Payer: Self-pay | Admitting: Gynecologic Oncology

## 2022-03-30 VITALS — BP 146/72 | HR 89 | Temp 98.2°F | Resp 16 | Ht 64.96 in | Wt 124.6 lb

## 2022-03-30 DIAGNOSIS — Z923 Personal history of irradiation: Secondary | ICD-10-CM | POA: Insufficient documentation

## 2022-03-30 DIAGNOSIS — N39498 Other specified urinary incontinence: Secondary | ICD-10-CM | POA: Insufficient documentation

## 2022-03-30 DIAGNOSIS — Z90722 Acquired absence of ovaries, bilateral: Secondary | ICD-10-CM | POA: Diagnosis not present

## 2022-03-30 DIAGNOSIS — Z8542 Personal history of malignant neoplasm of other parts of uterus: Secondary | ICD-10-CM | POA: Diagnosis not present

## 2022-03-30 DIAGNOSIS — Z85828 Personal history of other malignant neoplasm of skin: Secondary | ICD-10-CM | POA: Insufficient documentation

## 2022-03-30 DIAGNOSIS — C541 Malignant neoplasm of endometrium: Secondary | ICD-10-CM

## 2022-03-30 DIAGNOSIS — Z08 Encounter for follow-up examination after completed treatment for malignant neoplasm: Secondary | ICD-10-CM | POA: Insufficient documentation

## 2022-03-30 DIAGNOSIS — Z90710 Acquired absence of both cervix and uterus: Secondary | ICD-10-CM | POA: Diagnosis not present

## 2022-03-30 DIAGNOSIS — N9089 Other specified noninflammatory disorders of vulva and perineum: Secondary | ICD-10-CM | POA: Insufficient documentation

## 2022-03-30 MED ORDER — CLOBETASOL PROPIONATE 0.05 % EX OINT: 1.0000 | TOPICAL_OINTMENT | CUTANEOUS | 0 refills | Status: AC

## 2022-03-30 MED ORDER — LIDOCAINE 5 % EX OINT: 1.0000 | TOPICAL_OINTMENT | CUTANEOUS | 0 refills | Status: DC | PRN

## 2022-03-30 NOTE — Telephone Encounter (Signed)
Prior auth sent via cover my meds

## 2022-03-30 NOTE — Progress Notes (Signed)
Gynecologic Oncology Return Clinic Visit  03/30/22  Reason for Visit: surveillance visit in the setting of recurrent endometrial cancer   Treatment History: [name] is a 81 year old woman with a history of stage IA grade 1 endometrial cancer in 2018 who was seen in consultation at the request of Dr Delora Fuel for evaluation of a vaginal mass.   The patient underwent surgical staging for her endometrial cancer in May 2018.  This included a robotic total hysterectomy with BSO and sentinel lymph node biopsy.  Her surgery was complicated by extensive adhesions from her prior Whipple procedure.   Final pathology revealed a grade 1 endometrioid endometrial adenocarcinoma that measured 4 cm.  There was inner half myometrial invasion and negative sentinel lymph nodes with no lymphovascular space invasion.  She was characterizes having low risk endometrial cancer and therefore no adjuvant therapy was recommended in accordance with NCCN guidelines. MMR testing was not performed at that time, but later requested revealing loss of expression of MLH1 and PMS2 (MSI high), and ER /PR strongly positive.   She received subsequent follow-up with her benign gynecologist and was lost to follow-up with [place].   In early December 2021 she began experiencing postmenopausal bleeding.  She reported this to her primary care doctor who attempted speculum exam which was unsuccessful due to discomfort.  The patient was then seen by Dr. Delora Fuel who performed a limited speculum exam due to patient discomfort and recognized a vaginal mass.  She subsequently referred the patient to see GYN oncology.   The patient was then seen by me on 05/13/20 and on vaginal examination the posterior left vaginal wall/apex was replaced by tumor. Biopsy was performed which confirmed recurrent endometrioid endometrial cancer.    She received restaging with PET scan on 06/18/20 which revealed hypermetabolism noted in the vaginal cuff and upper  1/3 of the vagina consistent with known neoplasm. No locoregional lymphadenopathy or distant metastatic disease was seen. This suggested a localized pelvic recurrence and treatment with salvage radiation was recommended.   She received salvage radiation between 06/21/2020 through 08/25/2020.  This included external beam radiation with IMRT to the vagina and pelvis, total dose 45 Gy.  Additionally received she received vaginal and pelvic boost with HDR brachytherapy total dose of 24 Gy.   After being seeing for post-treatment surveillance in June, 2022, she was scheduled to have a post-treatment surveillance/evaluation PET scan on 11/11/20. This revealed complete resolution at the vaginal/pelvic recurrence site, however there was a new 12 mm posterior left upper lobe pulmonary nodule/metastasis seen.   The patient then immediately travelled to [place] to visit family for 3 months. Her case was presented at the thoracic multidisciplinary conference and the recommendation was made for referral/consultation with Dr Roxan Hockey from Thoracic Surgery. She elected to stay on on [place] until September, when she would return for repeat imaging and consultation with Thoracic Surgery.    On 01/28/21 she received a CT chest to monitor the pulmonary lesion and this showed an interval enlargement of the aggressive appearing left upper lobe pulmonary nodule which measured 1.7 x 1.2 x 1.2 cm. It was felt to likely represent a solitary metastatic lesion, however, the possibility of a primary bronchogenic neoplasm also warranted consideration, particularly in light of the morphology of the nodule. No new pulmonary nodules and no lymphadenopathy were noted in the thorax.   She was seen by Dr Roxan Hockey who recommended a VATS procedure.     On 03/09/21, she underwent Robotic LUL  segmentectomy with lymph node dissection with tear of the lingular arterial branch require thoracotomy to control bleeding. She was ultimately  discharged to home with home PT/OT and home health on 11/2. Final pathology revealed metastatic adenocarcinoma c/w known endometrial adenocarcinoma, negative lymph nodes. ER+.  Margins widely negative (1.6 cm and greater).  CT C/A/P on 08/03/21 revealed postoperative findings in the left harmonics including small left pleural effusion with enhancing pleural surfaces.  No abdominal or pelvic findings concerning for metastatic disease.  Interval History: She saw Dr. Sondra Come in August.  Given urinary incontinence symptoms, pelvic floor physical therapy was discussed and she was amenable to the referral.  Overall, has improved significantly since her last visit with me.  Has gained about 20 pounds.  Continues to have breathing symptoms including tightness, shortness of breath, and breathlessness.  The symptoms are worse some days than others.  After radiation, began having urinary incontinence.  By her description, this sounds at least partially urgency incontinence but may also have some component of overflow incontinence.  It has worsened over the last year.  She has chronic irritation of her vulvar and vaginal area due to incontinence and having to wear a pad.  She has tried barrier cream such as A&E and Vaseline which helps provide some temporary relief.  She denies any vaginal bleeding.  Reports baseline bowel function.  Denies any abdominal or pelvic pain.  Past Medical/Surgical History: Past Medical History:  Diagnosis Date   Anemia    Atypical mole 05/15/2005   Left Mid Outer Back (slight to moderate)   BCC (basal cell carcinoma of skin) 11/17/1991   Right Cheek (excision)   BCC (basal cell carcinoma of skin) 05/30/1994   Left Upper Forehead (curet and 5FU)   BCC (basal cell carcinoma of skin) 05/04/1997   Right upper Forehead/hairline (curet and excision)   BCC (basal cell carcinoma of skin) 06/25/1997   Right Upper Forehead/hairline (+margin) (MOH's)   BCC (basal cell carcinoma of skin)  11/12/2006   Left Cheek (curet and excision)   BCC (basal cell carcinoma of skin) 08/15/2009   Right Lower Leg (curet 5FU)   BCE (basal cell epithelioma), leg, right    Cataract 2021   Colon polyps    Coronary artery disease 2008   Coronary atherosclerosis    Diabetes mellitus without complication ([place])    pre-diabetic   DVT of lower extremity (deep venous thrombosis) ([place]) 07/2010   endometrioid endometrial    GI bleed 2009   GIST (gastrointestinal stromal tumor), 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 Sondra Come   History of radiation therapy 08/17/2020, 08/23/2020, 08/25/2020   vaginal brachytherapy     Dr Gery Pray   Hypercholesteremia    Hyperlipidemia    Hypertension    per patient-on medication for preventative/has not diagnosed with HTN    Insomnia    Iron deficiency    Joint pain    Lung cancer ([place])    Osteopenia    Reflux    SCCA (squamous cell carcinoma) of skin 05/15/2005   Left Chest (in situ)   SCCA (squamous cell carcinoma) of skin 10/24/2011   Left Side of Chest Med (in situ) (tx p bx)   SCCA (squamous cell carcinoma) of skin 01/28/2019   Right Forearm inf (Keratoacanthoma) (tx p bx)   Superficial basal cell carcinoma (BCC) 06/25/1995   Upper Forehead at  scar (curet and 5FU)   Ulcer    epigastric     Past Surgical History:  Procedure Laterality Date   ABDOMINAL HYSTERECTOMY     AUGMENTATION MAMMAPLASTY     BREAST ENHANCEMENT SURGERY     since removed in 2016   [place]     pt claims she has never had a cardiac catheterization   CATARACT EXTRACTION Bilateral 2021   CHOLECYSTECTOMY     INTERCOSTAL NERVE BLOCK Left 03/09/2021   Procedure: INTERCOSTAL NERVE BLOCK;  Surgeon: [name], MD;  Location: [facility];  Service: Thoracic;  Laterality: Left;   LEFT HEART CATH AND CORONARY ANGIOGRAPHY N/A 09/06/2021   Procedure: LEFT HEART  CATH AND CORONARY ANGIOGRAPHY;  Surgeon: [name], MD;  Location: [facility];  Service: Cardiovascular;  Laterality: N/A;   LYMPH NODE DISSECTION Left 03/09/2021   Procedure: LYMPH NODE DISSECTION;  Surgeon: [name], MD;  Location: [facility];  Service: Thoracic;  Laterality: Left;   [place]  06/27/2010   whipple    ROBOTIC ASSISTED TOTAL HYSTERECTOMY WITH BILATERAL SALPINGO OOPHERECTOMY Bilateral 10/02/2016   Procedure: XI ROBOTIC ASSISTED TOTAL HYSTERECTOMY WITH BILATERAL SALPINGO OOPHORECTOMY WITH SENTINAL LYMPH NODE BIOPSY LYSIS OF ADHESIONS, EXPLORATORY LAPAROTOMY;  Surgeon: Everitt Amber, MD;  Location: [facility];  Service: Gynecology;  Laterality: Bilateral;   THORACOTOMY Left 03/09/2021   Procedure: THORACOTOMY MAJOR;  Surgeon: [name], MD;  Location[facility];  Service: Thoracic;  Laterality: Left;   TUBAL LIGATION     WHIPPLE PROCEDURE  2012    Family History  Problem Relation Age of Onset   Diabetes Mother    Dementia Mother    Hypertension Mother    Heart attack Father    Heart attack Brother    Cancer Brother        melanoma   Colon cancer Neg Hx    Breast cancer Neg Hx    Ovarian cancer Neg Hx    Endometrial cancer Neg Hx    Pancreatic cancer Neg Hx    Prostate cancer Neg Hx     Social History   Socioeconomic History   Marital status: Widowed    Spouse name: Not on file   Number of children: 1   Years of education: Not on file   Highest education level: Not on file  Occupational History   Occupation: accounting    Comment: retired  Tobacco Use   Smoking status: Never   Smokeless tobacco: Never  Vaping Use   Vaping Use: Never used  Substance and Sexual Activity   Alcohol use: No   Drug use: No   Sexual activity: Not Currently    Birth control/protection: Surgical  Other Topics Concern   Not on file  Social History Narrative   ** Merged History Encounter **        Social Determinants of Health   Financial Resource Strain: Not on file  Food Insecurity: Not on file  Transportation Needs: Not on file  Physical Activity: Not on file  Stress: Not on file  Social Connections: Not on file    Current Medications:  Current Outpatient Medications:    Apoaequorin (PREVAGEN) 10 MG CAPS, Take 10 mg by mouth daily., Disp: , Rfl:    Ascorbic Acid (VITAMIN C) 500 MG CAPS, Take 500 mg by mouth daily., Disp: , Rfl:    [START ON 04/02/2022] clobetasol ointment (  TEMOVATE) 3.38 %, Apply 1 Application topically 2 (two) times a week. Use this on vulva no more than 2 times a week at night. If symptoms get worse, stop using, Disp: 30 g, Rfl: 0   CREON 12000-38000 units CPEP capsule, Take 12,000 Units by mouth 3 (three) times daily before meals., Disp: , Rfl:    Cyanocobalamin (B-12 PO), Take 1 capsule by mouth daily., Disp: , Rfl:    famotidine (PEPCID) 20 MG tablet, Take 20 mg by mouth daily as needed., Disp: , Rfl:    lidocaine (XYLOCAINE) 5 % ointment, Apply 1 Application topically as needed. You can use this 3-4 times a day as needed for relief of irritation/pain, Disp: 35.44 g, Rfl: 0   Melatonin 10 MG TABS, Take 10 mg by mouth at bedtime as needed (sleep)., Disp: , Rfl:    ONETOUCH VERIO test strip, daily., Disp: , Rfl:    VITAMIN D PO, Take 1 capsule by mouth daily., Disp: , Rfl:    vitamin E 180 MG (400 UNITS) capsule, Take 400 Units by mouth at bedtime., Disp: , Rfl:    zinc gluconate 50 MG tablet, Take 50 mg by mouth 2 (two) times daily., Disp: , Rfl:   Current Facility-Administered Medications:    sodium chloride flush (NS) 0.9 % injection 3 mL, 3 mL, Intravenous, Q12H, Croitoru, Mihai, MD  Review of Systems: + Voice changes, shortness of breath, urinary frequency, incontinence, itching. Denies appetite changes, fevers, chills, fatigue, unexplained weight changes. Denies hearing loss, neck lumps or masses, mouth sores, ringing in ears. Denies cough or  wheezing.   Denies chest pain or palpitations. Denies leg swelling. Denies abdominal distention, pain, blood in stools, constipation, diarrhea, nausea, vomiting, or early satiety. Denies pain with intercourse, dysuria, hematuria. Denies hot flashes, pelvic pain, vaginal bleeding or vaginal discharge.   Denies joint pain, back pain or muscle pain/cramps. Denies rash or wounds. Denies dizziness, headaches, numbness or seizures. Denies swollen lymph nodes or glands, denies easy bruising or bleeding. Denies anxiety, depression, confusion, or decreased concentration.  Physical Exam: BP (!) 146/72 (BP Location: Right Arm, Patient Position: Sitting)   Pulse 89   Temp 98.2 F (36.8 C) (Oral)   Resp 16   Ht 5' 4.96" (1.65 m)   Wt 124 lb 9.6 oz (56.5 kg)   SpO2 97%   BMI 20.76 kg/m  General: Alert, oriented, no acute distress. HEENT: Normocephalic, atraumatic, sclera anicteric. Chest: Clear to auscultation bilaterally.  No wheezes or rhonchi. Cardiovascular: Regular rate and rhythm, no murmurs. Abdomen: soft, nontender.  Normoactive bowel sounds.  No masses or hepatosplenomegaly appreciated.  Well-healed incisions. Extremities: Grossly normal range of motion.  Warm, well perfused.  No edema bilaterally. Skin: No rashes or lesions noted. Lymphatics: No cervical, supraclavicular, or inguinal adenopathy. GU: Erythema of the vulva, especially posterior vulva with loss of architecture of the labia posteriorly, suspicious for lichen sclerosis. Speculum exam reveals agglutination of the upper vagina, can only place the speculum approximately 2-3 cm within the vagina.  Vaginal mucosa is atrophic with radiation changes noted.  No masses seen.  On rectovaginal exam, no masses or nodularity.  Laboratory & Radiologic Studies: None new  Assessment & Plan: [name] is a 81 y.o. woman with a history of recurrent endometrioid endometrial cancer at the vaginal cuff and posterior vaginal wall (MMR  deficient, ER/PR positive) diagnosed in December, 2021 after initial staging surgery/diagnosis in May, 2018. S/p salvage RT to pelvis and vagina. Most recently had oligometastatic disease  within the left upper lung status post resection in October 5183 complicated by bleeding.  Pathology confirms recurrent disease, margins widely negative.     Patient presents today for surveillance visit.  She is NED and overall has improved significantly since her last visit with me.   From a cancer standpoint, I have recommended that we get repeat imaging.  In the setting of completely resected oligometastatic disease with negative margins, I would favor antiestrogen treatment given her ER positive cancer.  We discussed starting an antiestrogen agent at her last visit.  She was not ready at that time.  We discussed this again today.  Given her overall health, she declines starting antiestrogen at this time.  I encouraged her to reach out to the CT surgeon regarding her breathing symptoms.  Upcoming CT scan will evaluate any changes related to cancer or postoperative changes in her chest from her last CT scan in March of this year.   She is quite symptomatic regarding her urinary incontinence, which sounds like a combination of urgency and overflow.  I placed a referral to urology for work-up and treatment.  Based on her exam, I suspect that she may also have lichen sclerosus.  I am sending in a prescription for lidocaine to help with irritation symptoms and also clobetasol.  I have asked her to try the clobetasol 2 times a week but to stop it if it worsens her irritation symptoms at all.   We will continue with 83-monthsurveillance visits alternating between our clinic and radiation oncology. Patient is scheduled to see radiation oncology in 06/2022.  I will see her back in May.   28 minutes of total time was spent for this patient encounter, including preparation, face-to-face counseling with the patient and  coordination of care, and documentation of the encounter.  KJeral Pinch MD  Division of Gynecologic Oncology  Department of Obstetrics and Gynecology  [facility]

## 2022-03-30 NOTE — Telephone Encounter (Signed)
Called patient to advise her to get an OTC lidocaine cream to help relieve her symptoms through the weekend since the prior auth was denied. Patient advised our office will resume working on getting her a prescription to help her symptoms on Monday

## 2022-03-30 NOTE — Patient Instructions (Addendum)
It was good to see you today.  I do not see or feel any evidence of cancer recurrence on your exam.  I will call you with your CT results.  I sent 2 prescriptions in today to your pharmacy.  The first is topical lidocaine.  You can use this several times a day to help with any pain related to the irritation on your bottom.  The second is a steroid cream.  Please do not use this more than 2 times a week at night on your vulva.  If the irritation symptoms worsen at all with this cream, please stop using it.  I am sending in a referral for urology for your urinary symptoms.  I will see you for follow-up in 6 months.  As always, if you develop any new and concerning symptoms before your next visit, please call to see me sooner.

## 2022-03-30 NOTE — Telephone Encounter (Signed)
Referral faxed and confirmed to [facility]

## 2022-04-03 NOTE — Telephone Encounter (Signed)
Followed up with patient regarding lidocaine ointment. Patient notifed that prior Josem Kaufmann was denied. Patient stated she was able to get otc cream to soothe irritation and she picked up her clobetasol prescription and that is helping. Patient states she doesn't think she needs the lidocaine after all but that if something changes she'll call our office back. Patient had no other concerns at this time.

## 2022-04-06 ENCOUNTER — Ambulatory Visit (HOSPITAL_COMMUNITY): Payer: PPO

## 2022-04-07 ENCOUNTER — Ambulatory Visit (HOSPITAL_COMMUNITY)
Admission: RE | Admit: 2022-04-07 | Discharge: 2022-04-07 | Disposition: A | Discharge status: Home / Self Care | Payer: PPO | Source: Ambulatory Visit | Attending: Gynecologic Oncology | Admitting: Gynecologic Oncology

## 2022-04-07 DIAGNOSIS — K7689 Other specified diseases of liver: Secondary | ICD-10-CM | POA: Diagnosis not present

## 2022-04-07 DIAGNOSIS — J479 Bronchiectasis, uncomplicated: Secondary | ICD-10-CM | POA: Diagnosis not present

## 2022-04-07 DIAGNOSIS — I700 Atherosclerosis of aorta: Secondary | ICD-10-CM | POA: Diagnosis not present

## 2022-04-07 DIAGNOSIS — K8689 Other specified diseases of pancreas: Secondary | ICD-10-CM | POA: Diagnosis not present

## 2022-04-07 DIAGNOSIS — J90 Pleural effusion, not elsewhere classified: Secondary | ICD-10-CM | POA: Diagnosis not present

## 2022-04-07 DIAGNOSIS — C541 Malignant neoplasm of endometrium: Secondary | ICD-10-CM

## 2022-04-07 LAB — POCT I-STAT CREATININE: Creatinine, Ser: 0.5 mg/dL (ref 0.44–1.00)

## 2022-04-07 MED ORDER — IOHEXOL 300 MG/ML  SOLN
100.0000 mL | Freq: Once | INTRAMUSCULAR | Status: AC | PRN
Start: 2022-04-07 — End: 2022-04-07
  Administered 2022-04-07: 100 mL via INTRAVENOUS

## 2022-04-09 ENCOUNTER — Telehealth: Payer: Self-pay | Admitting: Surgery

## 2022-04-09 ENCOUNTER — Telehealth: Payer: Self-pay | Admitting: Gynecologic Oncology

## 2022-04-09 NOTE — Telephone Encounter (Signed)
Patient returned call from Dr Berline Lopes and LVM. Called patient back and reviewed CT scan results per Dr Charisse March note. Patient verbalized understanding and stated she would speak with her family and call back to let us know if she wants to proceed with colonoscopy or CT colonography.

## 2022-04-09 NOTE — Telephone Encounter (Signed)
Called the patient to discuss CT results.  Overall, CT shows no evidence of cancer recurrence.  There is questionable finding of thickening of the cecum, which is not very well visualized secondary to underdistention.  Recommendation is either for consideration of colonoscopy versus CT colonography.   If the patient returns my call when I am not available, please let her know the above and ask what her preference is between referral to GI for colonoscopy versus special CT scan to take a better look at the colon.  Jeral Pinch MD Gynecologic Oncology

## 2022-04-10 ENCOUNTER — Ambulatory Visit: Payer: PPO | Admitting: Gynecologic Oncology

## 2022-04-10 DIAGNOSIS — N1831 Chronic kidney disease, stage 3a: Secondary | ICD-10-CM | POA: Diagnosis not present

## 2022-04-10 DIAGNOSIS — I700 Atherosclerosis of aorta: Secondary | ICD-10-CM | POA: Diagnosis not present

## 2022-04-10 DIAGNOSIS — C49A9 Gastrointestinal stromal tumor of other sites: Secondary | ICD-10-CM | POA: Diagnosis not present

## 2022-04-10 DIAGNOSIS — Z0000 Encounter for general adult medical examination without abnormal findings: Secondary | ICD-10-CM | POA: Diagnosis not present

## 2022-04-10 DIAGNOSIS — R935 Abnormal findings on diagnostic imaging of other abdominal regions, including retroperitoneum: Secondary | ICD-10-CM | POA: Diagnosis not present

## 2022-04-10 DIAGNOSIS — D509 Iron deficiency anemia, unspecified: Secondary | ICD-10-CM | POA: Diagnosis not present

## 2022-04-10 DIAGNOSIS — I2510 Atherosclerotic heart disease of native coronary artery without angina pectoris: Secondary | ICD-10-CM | POA: Diagnosis not present

## 2022-04-10 DIAGNOSIS — E1169 Type 2 diabetes mellitus with other specified complication: Secondary | ICD-10-CM | POA: Diagnosis not present

## 2022-04-10 DIAGNOSIS — E785 Hyperlipidemia, unspecified: Secondary | ICD-10-CM | POA: Diagnosis not present

## 2022-04-10 DIAGNOSIS — C55 Malignant neoplasm of uterus, part unspecified: Secondary | ICD-10-CM | POA: Diagnosis not present

## 2022-04-10 DIAGNOSIS — C3492 Malignant neoplasm of unspecified part of left bronchus or lung: Secondary | ICD-10-CM | POA: Diagnosis not present

## 2022-04-10 DIAGNOSIS — M810 Age-related osteoporosis without current pathological fracture: Secondary | ICD-10-CM | POA: Diagnosis not present

## 2022-04-10 DIAGNOSIS — Z86718 Personal history of other venous thrombosis and embolism: Secondary | ICD-10-CM | POA: Diagnosis not present

## 2022-04-10 DIAGNOSIS — Z7189 Other specified counseling: Secondary | ICD-10-CM | POA: Diagnosis not present

## 2022-04-10 DIAGNOSIS — Z1331 Encounter for screening for depression: Secondary | ICD-10-CM | POA: Diagnosis not present

## 2022-04-16 DIAGNOSIS — E119 Type 2 diabetes mellitus without complications: Secondary | ICD-10-CM | POA: Diagnosis not present

## 2022-05-15 DIAGNOSIS — R7989 Other specified abnormal findings of blood chemistry: Secondary | ICD-10-CM | POA: Diagnosis not present

## 2022-05-15 DIAGNOSIS — K6389 Other specified diseases of intestine: Secondary | ICD-10-CM | POA: Diagnosis not present

## 2022-05-15 DIAGNOSIS — Z91199 Patient's noncompliance with other medical treatment and regimen due to unspecified reason: Secondary | ICD-10-CM | POA: Diagnosis not present

## 2022-05-15 DIAGNOSIS — R9389 Abnormal findings on diagnostic imaging of other specified body structures: Secondary | ICD-10-CM | POA: Diagnosis not present

## 2022-05-15 DIAGNOSIS — R933 Abnormal findings on diagnostic imaging of other parts of digestive tract: Secondary | ICD-10-CM | POA: Diagnosis not present

## 2022-05-24 DIAGNOSIS — D490 Neoplasm of unspecified behavior of digestive system: Secondary | ICD-10-CM | POA: Diagnosis not present

## 2022-06-22 ENCOUNTER — Telehealth: Payer: Self-pay | Admitting: *Deleted

## 2022-06-22 ENCOUNTER — Other Ambulatory Visit: Payer: Self-pay | Admitting: Gynecologic Oncology

## 2022-06-22 ENCOUNTER — Telehealth: Payer: Self-pay

## 2022-06-22 DIAGNOSIS — M810 Age-related osteoporosis without current pathological fracture: Secondary | ICD-10-CM | POA: Diagnosis not present

## 2022-06-22 DIAGNOSIS — N1831 Chronic kidney disease, stage 3a: Secondary | ICD-10-CM | POA: Diagnosis not present

## 2022-06-22 DIAGNOSIS — I2510 Atherosclerotic heart disease of native coronary artery without angina pectoris: Secondary | ICD-10-CM | POA: Diagnosis not present

## 2022-06-22 DIAGNOSIS — G4700 Insomnia, unspecified: Secondary | ICD-10-CM | POA: Diagnosis not present

## 2022-06-22 DIAGNOSIS — E785 Hyperlipidemia, unspecified: Secondary | ICD-10-CM | POA: Diagnosis not present

## 2022-06-22 DIAGNOSIS — N9089 Other specified noninflammatory disorders of vulva and perineum: Secondary | ICD-10-CM

## 2022-06-22 DIAGNOSIS — E1169 Type 2 diabetes mellitus with other specified complication: Secondary | ICD-10-CM | POA: Diagnosis not present

## 2022-06-22 MED ORDER — LIDOCAINE-PRILOCAINE 2.5-2.5 % EX CREA: 1.0000 | TOPICAL_CREAM | Freq: Three times a day (TID) | CUTANEOUS | 0 refills | Status: DC | PRN

## 2022-06-22 NOTE — Telephone Encounter (Signed)
Call from [name] at [phone] Patient reported she was unable get lidocaine called in by Dr Berline Lopes due to insurance coverage on her formulary.  Alternative options that are covered: Lidocaine Ext ointment 5%- 150 gms/ 30 days. Lidocaine Prilocaine 2.5/2.5% 30 gms/ 30 days and this option requires prior auth.  Upon review Lidocaine 5% ointment was the prescribed medication- [name] requests we call in Lidocaine/Prilocaine in effort to get patient the prescription.

## 2022-06-22 NOTE — Telephone Encounter (Signed)
EMLA cream PA sent through CoverMyMeds. Waiting on approval

## 2022-06-22 NOTE — Telephone Encounter (Signed)
Call to [name] ( name correctionDollene Foley) Advised new prescription has been sent. Since [place] spoke to patient regarding this- she will call back to patient and notify of change and that prior auth may take a few days for response.

## 2022-06-25 NOTE — Telephone Encounter (Signed)
Insurance denied EMLA cream prescription PA. Per Joylene John, APP, called patient and recommended she try Lidocaine 4% OTC. Patient verbalized she would go to the pharmacy and pick up Lidocaine 4% to try. Patient had no other concerns at this time.

## 2022-07-13 DIAGNOSIS — E440 Moderate protein-calorie malnutrition: Secondary | ICD-10-CM | POA: Diagnosis not present

## 2022-07-13 DIAGNOSIS — I2510 Atherosclerotic heart disease of native coronary artery without angina pectoris: Secondary | ICD-10-CM | POA: Diagnosis not present

## 2022-07-13 DIAGNOSIS — C3492 Malignant neoplasm of unspecified part of left bronchus or lung: Secondary | ICD-10-CM | POA: Diagnosis not present

## 2022-07-13 DIAGNOSIS — C55 Malignant neoplasm of uterus, part unspecified: Secondary | ICD-10-CM | POA: Diagnosis not present

## 2022-07-13 DIAGNOSIS — E785 Hyperlipidemia, unspecified: Secondary | ICD-10-CM | POA: Diagnosis not present

## 2022-07-13 DIAGNOSIS — E1165 Type 2 diabetes mellitus with hyperglycemia: Secondary | ICD-10-CM | POA: Diagnosis not present

## 2022-07-13 DIAGNOSIS — M810 Age-related osteoporosis without current pathological fracture: Secondary | ICD-10-CM | POA: Diagnosis not present

## 2022-07-13 DIAGNOSIS — K8681 Exocrine pancreatic insufficiency: Secondary | ICD-10-CM | POA: Diagnosis not present

## 2022-07-13 DIAGNOSIS — C49A9 Gastrointestinal stromal tumor of other sites: Secondary | ICD-10-CM | POA: Diagnosis not present

## 2022-07-13 DIAGNOSIS — I700 Atherosclerosis of aorta: Secondary | ICD-10-CM | POA: Diagnosis not present

## 2022-07-13 DIAGNOSIS — R809 Proteinuria, unspecified: Secondary | ICD-10-CM | POA: Diagnosis not present

## 2022-07-17 DIAGNOSIS — E440 Moderate protein-calorie malnutrition: Secondary | ICD-10-CM | POA: Diagnosis not present

## 2022-07-17 DIAGNOSIS — N3941 Urge incontinence: Secondary | ICD-10-CM | POA: Diagnosis not present

## 2022-07-17 DIAGNOSIS — D6859 Other primary thrombophilia: Secondary | ICD-10-CM | POA: Diagnosis not present

## 2022-07-17 DIAGNOSIS — E1165 Type 2 diabetes mellitus with hyperglycemia: Secondary | ICD-10-CM | POA: Diagnosis not present

## 2022-07-17 DIAGNOSIS — C7800 Secondary malignant neoplasm of unspecified lung: Secondary | ICD-10-CM | POA: Diagnosis not present

## 2022-07-17 DIAGNOSIS — Z86711 Personal history of pulmonary embolism: Secondary | ICD-10-CM | POA: Diagnosis not present

## 2022-07-17 DIAGNOSIS — I700 Atherosclerosis of aorta: Secondary | ICD-10-CM | POA: Diagnosis not present

## 2022-07-18 NOTE — Progress Notes (Incomplete)
Radiation Oncology         [phone] ________________________________  Name: [name] MRN: [id number]  Date: 07/19/2022  DOB: 15-Feb-1941  Follow-Up Visit Note  CC: [name], MD  [name],*  No diagnosis found.  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 She was last seen for follow-up on 01/11/22.   Since her last visit, the patient followed up with Dr. Berline Lopes on 03/30/22. During which time, the patient was noted to have improved significantly since her last visit (gained 20 pounds). She however endorsed ongoing issues with chest tightness, SOB, breathlessness, worsening urinary incontinence, and chronic irritation of her vulvar and vaginal area due to incontinence and having to wear a pad. Pelvic exam performed during this visit showed erythema of the vulva, especially pertaining to the posterior vulva, with loss of architecture of the labia posteriorly, suspicious for lichen sclerosis. 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 The vaginal mucosa also appeared to be atrophic with radiation changes noted. Despite these findings, the patient was noted to have overall improved significantly since her last visit with Dr. Berline Lopes and she was noted to be NED  on examination.     However, based on exam findings, the patient was suspected to have lichen sclerosus. Dr. Berline Lopes gave her an Rx for lidocaine to help with irritation symptoms, along with clobetasol.   Pertinent imaging performed in the interval includes a CT CAP on 04/07/22 demonstrated: no acute intrathoracic, abdominal, or pelvic pathology, and no evidence of recurrent or metastatic disease. CT also showed a thickened appearance of the wall of the cecum which was possibly related to underdistention (developing infiltrative process is not excluded), and stable post surgical changes of the left lung with a chronic small left pleural effusion.        ***                       Allergies:  is allergic to ibuprofen.  Meds: Current Outpatient Medications  Medication Sig Dispense Refill   Apoaequorin (PREVAGEN) 10 MG CAPS Take 10 mg by mouth daily.     Ascorbic Acid (VITAMIN C) 500 MG CAPS Take 500 mg by mouth daily.     clobetasol ointment (TEMOVATE) 4.09 % Apply 1 Application topically 2 (two) times a week. Use this on vulva no more than 2 times a week at night. If symptoms get worse, stop using 30 g 0   CREON 12000-38000 units CPEP capsule Take 12,000 Units by mouth 3 (three) times daily before meals.     Cyanocobalamin (B-12 PO) Take 1 capsule by mouth daily.     famotidine (PEPCID) 20 MG tablet Take 20 mg by mouth daily as needed.     lidocaine-prilocaine (EMLA) cream Apply 1 Application topically 3 (three) times daily as needed. For irritation on  the vulva 30 g 0   Melatonin 10 MG TABS Take 10 mg by mouth at bedtime as needed (sleep).     ONETOUCH VERIO test strip daily.     VITAMIN D PO Take 1 capsule by mouth daily.     vitamin E 180 MG (400 UNITS) capsule Take 400 Units by mouth at bedtime.     zinc gluconate 50 MG tablet Take 50 mg by mouth 2 (two) times daily.     Current Facility-Administered Medications  Medication Dose Route Frequency Provider Last Rate Last Admin   sodium  chloride flush (NS) 0.9 % injection 3 mL  3 mL Intravenous Q12H Croitoru, Mihai, MD        Physical Findings: The patient is in no acute distress. Patient is alert and oriented.  vitals were not taken for this visit. .  No significant changes. Lungs are clear to auscultation bilaterally. Heart has regular rate and rhythm. No palpable cervical, supraclavicular, or axillary adenopathy. Abdomen soft, non-tender, normal bowel sounds.  On pelvic examination the external genitalia were unremarkable. A speculum exam was performed. There are no mucosal lesions noted in the vaginal vault. A Pap smear was obtained of the proximal vagina. On bimanual and rectovaginal examination there were no pelvic masses appreciated. ***   Lab Findings: Lab Results  Component Value Date   WBC 4.6 09/06/2021   HGB 13.5 09/06/2021   HCT 41.3 09/06/2021   MCV 93.4 09/06/2021   PLT 206 09/06/2021    Radiographic Findings: No results found.  Impression: Recurrent stage IA, grade 1 endometrial cancer; with left upper lobe lung nodule, now determined as biopsy proven metastatic adenocarcinoma from endometrial cancer primary   The patient is recovering from the effects of radiation.  ***  Plan:  ***   *** minutes of total time was spent for this patient encounter, including preparation, face-to-face counseling with the patient and coordination of care, physical exam, and documentation of the encounter. ____________________________________  Blair Promise, PhD, MD  This document serves as a record of services personally performed by Gery Pray, MD. It was created on his behalf by Roney Mans, a trained medical scribe. The creation of this record is based on the scribe's personal observations and the provider's statements to them. This document has been checked and approved by the attending provider.

## 2022-07-19 ENCOUNTER — Ambulatory Visit
Admission: RE | Admit: 2022-07-19 | Discharge: 2022-07-19 | Disposition: A | Discharge status: Home / Self Care | Payer: PPO | Source: Ambulatory Visit | Attending: Radiation Oncology | Admitting: Radiation Oncology

## 2022-07-25 DIAGNOSIS — M810 Age-related osteoporosis without current pathological fracture: Secondary | ICD-10-CM | POA: Diagnosis not present

## 2022-07-25 DIAGNOSIS — G4700 Insomnia, unspecified: Secondary | ICD-10-CM | POA: Diagnosis not present

## 2022-07-25 DIAGNOSIS — E1165 Type 2 diabetes mellitus with hyperglycemia: Secondary | ICD-10-CM | POA: Diagnosis not present

## 2022-07-25 DIAGNOSIS — E785 Hyperlipidemia, unspecified: Secondary | ICD-10-CM | POA: Diagnosis not present

## 2022-07-27 NOTE — Progress Notes (Signed)
SUZANA KALDOR is here today for follow up post radiation to the pelvic.  They completed their radiation on: 06/21/2020- 08/25/2020  Does the patient complain of any of the following:  Pain:No Abdominal bloating: No Diarrhea/Constipation: Constipation at times.  Nausea/Vomiting: No Vaginal Discharge: No Blood in Urine or Stool: No Urinary Issues (dysuria/incomplete emptying/ incontinence/ increased frequency/urgency): Urinary incontinence.  Does patient report using vaginal dilator 2-3 times a week and/or sexually active 2-3 weeks: No, patient reports pain with using dilators. Patient states she has some vaginal adhesions.  Post radiation skin changes: Yes, reports having a rash to pelvis . Continues to apply clobetasol to area as needed.    Additional comments if applicable: Patient reports continues to have shortness of breath. Patient reports SOB could be related to seasonal allergies.   BP 132/61 (BP Location: Left Arm, Patient Position: Sitting, Cuff Size: Normal)   Pulse 71   Temp 97.6 F (36.4 C)   Resp 20   Ht 5' 4.9" (1.648 m)   Wt 122 lb (55.3 kg)   SpO2 99%   BMI 20.36 kg/m

## 2022-08-01 NOTE — Progress Notes (Signed)
Radiation Oncology         [phone] ________________________________  Name: [name] MRN: [id number]  Date: 08/02/2022  DOB: 07-10-1940  Follow-Up Visit Note  CC: [name], MD  [name],*  No diagnosis found.  Diagnosis: Recurrent stage IA, grade 1 endometrial cancer; with left upper lobe lung nodule, now determined as biopsy proven metastatic adenocarcinoma from endometrial cancer primary   Interval Since Last Radiation: 1 year, 11 months, and 9 days   Radiation Treatment Dates: 06/21/2020 through 08/25/2020   Site: Vagina: pelvis Technique: IMRT Total Dose (Gy): 45/45 Dose per Fx (Gy): 1.8 Completed Fx: 25/25 Beam Energies: 6X   Site: Vagina: pelvis boost Technique: HDR-brachytherapy Total Dose (Gy): 24/24 Dose per Fx (Gy): 6 Completed Fx: 4/4 Beam Energies: Ir-192   Narrative:  The patient returns today for routine 6 month follow-up. She was last seen for follow-up on 01/11/22.   Since her last visit, the patient followed up with Dr. Berline Lopes on 03/30/22. During which time, the patient was noted to have improved significantly since her last visit (gained 20 pounds). She however endorsed ongoing issues with chest tightness, SOB, breathlessness, worsening urinary incontinence, and chronic irritation of her vulvar and vaginal area due to incontinence and having to wear a pad. Pelvic exam performed during this visit showed erythema of the vulva, especially pertaining to the posterior vulva, with loss of architecture of the labia posteriorly, suspicious for lichen sclerosis. 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 The vaginal mucosa also appeared to be atrophic with radiation changes noted. Despite these findings, the patient was noted to have overall improved significantly since her last visit with Dr. Berline Lopes and she was noted to be NED on  examination.     However, based on exam findings, the patient was suspected to have lichen sclerosus. Dr. Berline Lopes gave her an Rx for lidocaine to help with irritation symptoms, along with clobetasol.   Pertinent imaging performed in the interval includes a CT CAP on 04/07/22 demonstrated: no acute intrathoracic, abdominal, or pelvic pathology, and no evidence of recurrent or metastatic disease. CT also showed a thickened appearance of the wall of the cecum which was possibly related to underdistention (developing infiltrative process is not excluded), and stable post surgical changes of the left lung with a chronic small left pleural effusion.        ***                       Allergies:  is allergic to ibuprofen.  Meds: Current Outpatient Medications  Medication Sig Dispense Refill   Apoaequorin (PREVAGEN) 10 MG CAPS Take 10 mg by mouth daily.     Ascorbic Acid (VITAMIN C) 500 MG CAPS Take 500 mg by mouth daily.     clobetasol ointment (TEMOVATE) [id number] % Apply 1 Application topically 2 (two) times a week. Use this on vulva no more than 2 times a week at night. If symptoms get worse, stop using 30 g 0   CREON 12000-38000 units CPEP capsule Take 12,000 Units by mouth 3 (three) times daily before meals.     Cyanocobalamin (B-12 PO) Take 1 capsule by mouth daily.     famotidine (PEPCID) 20 MG tablet Take 20 mg by mouth daily as needed.     lidocaine-prilocaine (EMLA) cream Apply 1 Application topically 3 (three) times daily as needed. For irritation on  the vulva 30 g 0   Melatonin 10 MG TABS Take 10 mg by mouth at bedtime as needed (sleep).     ONETOUCH VERIO test strip daily.     VITAMIN D PO Take 1 capsule by mouth daily.     vitamin E 180 MG (400 UNITS) capsule Take 400 Units by mouth at bedtime.     zinc gluconate 50 MG tablet Take 50 mg by mouth 2 (two) times daily.     Current Facility-Administered Medications  Medication Dose Route Frequency Provider Last Rate Last Admin   sodium  chloride flush (NS) 0.9 % injection 3 mL  3 mL Intravenous Q12H Croitoru, Mihai, MD        Physical Findings: The patient is in no acute distress. Patient is alert and oriented.  vitals were not taken for this visit. .  No significant changes. Lungs are clear to auscultation bilaterally. Heart has regular rate and rhythm. No palpable cervical, supraclavicular, or axillary adenopathy. Abdomen soft, non-tender, normal bowel sounds.  On pelvic examination the external genitalia were unremarkable. A speculum exam was performed. There are no mucosal lesions noted in the vaginal vault. A Pap smear was obtained of the proximal vagina. On bimanual and rectovaginal examination there were no pelvic masses appreciated. ***   Lab Findings: Lab Results  Component Value Date   WBC 4.6 09/06/2021   HGB 13.5 09/06/2021   HCT 41.3 09/06/2021   MCV 93.4 09/06/2021   PLT 206 09/06/2021    Radiographic Findings: No results found.  Impression: Recurrent stage IA, grade 1 endometrial cancer; with left upper lobe lung nodule, now determined as biopsy proven metastatic adenocarcinoma from endometrial cancer primary   The patient is recovering from the effects of radiation.  ***  Plan:  ***   *** minutes of total time was spent for this patient encounter, including preparation, face-to-face counseling with the patient and coordination of care, physical exam, and documentation of the encounter. ____________________________________  Blair Promise, PhD, MD  This document serves as a record of services personally performed by Gery Pray, MD. It was created on his behalf by Roney Mans, a trained medical scribe. The creation of this record is based on the scribe's personal observations and the provider's statements to them. This document has been checked and approved by the attending provider.

## 2022-08-02 ENCOUNTER — Encounter: Payer: Self-pay | Admitting: Radiation Oncology

## 2022-08-02 ENCOUNTER — Ambulatory Visit
Admission: RE | Admit: 2022-08-02 | Discharge: 2022-08-02 | Disposition: A | Discharge status: Home / Self Care | Payer: PPO | Source: Ambulatory Visit | Attending: Radiation Oncology | Admitting: Radiation Oncology

## 2022-08-02 VITALS — BP 132/61 | HR 71 | Temp 97.6°F | Resp 20 | Ht 64.9 in | Wt 122.0 lb

## 2022-08-02 DIAGNOSIS — Z923 Personal history of irradiation: Secondary | ICD-10-CM | POA: Insufficient documentation

## 2022-08-02 DIAGNOSIS — C7802 Secondary malignant neoplasm of left lung: Secondary | ICD-10-CM | POA: Diagnosis not present

## 2022-08-02 DIAGNOSIS — R918 Other nonspecific abnormal finding of lung field: Secondary | ICD-10-CM | POA: Insufficient documentation

## 2022-08-02 DIAGNOSIS — C541 Malignant neoplasm of endometrium: Secondary | ICD-10-CM

## 2022-08-13 DIAGNOSIS — N1831 Chronic kidney disease, stage 3a: Secondary | ICD-10-CM | POA: Diagnosis not present

## 2022-08-13 DIAGNOSIS — E1169 Type 2 diabetes mellitus with other specified complication: Secondary | ICD-10-CM | POA: Diagnosis not present

## 2022-08-13 DIAGNOSIS — M810 Age-related osteoporosis without current pathological fracture: Secondary | ICD-10-CM | POA: Diagnosis not present

## 2022-08-13 DIAGNOSIS — E785 Hyperlipidemia, unspecified: Secondary | ICD-10-CM | POA: Diagnosis not present

## 2022-08-22 DIAGNOSIS — E440 Moderate protein-calorie malnutrition: Secondary | ICD-10-CM | POA: Diagnosis not present

## 2022-08-22 DIAGNOSIS — E1165 Type 2 diabetes mellitus with hyperglycemia: Secondary | ICD-10-CM | POA: Diagnosis not present

## 2022-10-02 ENCOUNTER — Ambulatory Visit: Payer: PPO | Admitting: Gynecologic Oncology

## 2022-10-04 DIAGNOSIS — C49A9 Gastrointestinal stromal tumor of other sites: Secondary | ICD-10-CM | POA: Diagnosis not present

## 2022-10-04 DIAGNOSIS — K8681 Exocrine pancreatic insufficiency: Secondary | ICD-10-CM | POA: Diagnosis not present

## 2022-10-04 DIAGNOSIS — R809 Proteinuria, unspecified: Secondary | ICD-10-CM | POA: Diagnosis not present

## 2022-10-04 DIAGNOSIS — E440 Moderate protein-calorie malnutrition: Secondary | ICD-10-CM | POA: Diagnosis not present

## 2022-10-04 DIAGNOSIS — E1165 Type 2 diabetes mellitus with hyperglycemia: Secondary | ICD-10-CM | POA: Diagnosis not present

## 2022-10-04 DIAGNOSIS — Z86711 Personal history of pulmonary embolism: Secondary | ICD-10-CM | POA: Diagnosis not present

## 2022-10-04 DIAGNOSIS — R54 Age-related physical debility: Secondary | ICD-10-CM | POA: Diagnosis not present

## 2022-10-05 ENCOUNTER — Ambulatory Visit: Payer: PPO | Admitting: Gynecologic Oncology

## 2022-10-12 ENCOUNTER — Inpatient Hospital Stay: Payer: PPO | Attending: Gynecologic Oncology | Admitting: Gynecologic Oncology

## 2022-10-12 ENCOUNTER — Encounter: Payer: Self-pay | Admitting: Gynecologic Oncology

## 2022-10-12 VITALS — BP 130/62 | HR 55 | Temp 97.8°F | Resp 18 | Ht 64.17 in | Wt 120.0 lb

## 2022-10-12 DIAGNOSIS — Z90722 Acquired absence of ovaries, bilateral: Secondary | ICD-10-CM | POA: Insufficient documentation

## 2022-10-12 DIAGNOSIS — Z08 Encounter for follow-up examination after completed treatment for malignant neoplasm: Secondary | ICD-10-CM | POA: Diagnosis not present

## 2022-10-12 DIAGNOSIS — N904 Leukoplakia of vulva: Secondary | ICD-10-CM | POA: Diagnosis not present

## 2022-10-12 DIAGNOSIS — Z923 Personal history of irradiation: Secondary | ICD-10-CM | POA: Diagnosis not present

## 2022-10-12 DIAGNOSIS — Z90710 Acquired absence of both cervix and uterus: Secondary | ICD-10-CM | POA: Insufficient documentation

## 2022-10-12 DIAGNOSIS — C541 Malignant neoplasm of endometrium: Secondary | ICD-10-CM

## 2022-10-12 DIAGNOSIS — Z8542 Personal history of malignant neoplasm of other parts of uterus: Secondary | ICD-10-CM | POA: Insufficient documentation

## 2022-10-12 DIAGNOSIS — E1165 Type 2 diabetes mellitus with hyperglycemia: Secondary | ICD-10-CM | POA: Diagnosis not present

## 2022-10-12 NOTE — Progress Notes (Signed)
Gynecologic Oncology Return Clinic Visit  10/12/22  Reason for Visit: surveillance visit in the setting of recurrent endometrial cancer    Treatment History: [name] is a 82 year old woman with a history of stage IA grade 1 endometrial cancer in 2018 who was seen in consultation at the request of Dr Connye Burkitt for evaluation of a vaginal mass.   The patient underwent surgical staging for her endometrial cancer in May 2018.  This included a robotic total hysterectomy with BSO and sentinel lymph node biopsy.  Her surgery was complicated by extensive adhesions from her prior Whipple procedure.   Final pathology revealed a grade 1 endometrioid endometrial adenocarcinoma that measured 4 cm.  There was inner half myometrial invasion and negative sentinel lymph nodes with no lymphovascular space invasion.  She was characterizes having low risk endometrial cancer and therefore no adjuvant therapy was recommended in accordance with NCCN guidelines. MMR testing was not performed at that time, but later requested revealing loss of expression of MLH1 and PMS2 (MSI high), and ER /PR strongly positive.   She received subsequent follow-up with her benign gynecologist and was lost to follow-up with [place].   In early December 2021 she began experiencing postmenopausal bleeding.  She reported this to her primary care doctor who attempted speculum exam which was unsuccessful due to discomfort.  The patient was then seen by Dr. Connye Burkitt who performed a limited speculum exam due to patient discomfort and recognized a vaginal mass.  She subsequently referred the patient to see GYN oncology.   The patient was then seen by me on 05/13/20 and on vaginal examination the posterior left vaginal wall/apex was replaced by tumor. Biopsy was performed which confirmed recurrent endometrioid endometrial cancer.    She received restaging with PET scan on 06/18/20 which revealed hypermetabolism noted in the vaginal cuff and upper  1/3 of the vagina consistent with known neoplasm. No locoregional lymphadenopathy or distant metastatic disease was seen. This suggested a localized pelvic recurrence and treatment with salvage radiation was recommended.   She received salvage radiation between 06/21/2020 through 08/25/2020.  This included external beam radiation with IMRT to the vagina and pelvis, total dose 45 Gy.  Additionally received she received vaginal and pelvic boost with HDR brachytherapy total dose of 24 Gy.   After being seeing for post-treatment surveillance in June, 2022, she was scheduled to have a post-treatment surveillance/evaluation PET scan on 11/11/20. This revealed complete resolution at the vaginal/pelvic recurrence site, however there was a new 12 mm posterior left upper lobe pulmonary nodule/metastasis seen.   The patient then immediately travelled to [place] to visit family for 3 months. Her case was presented at the thoracic multidisciplinary conference and the recommendation was made for referral/consultation with Dr Dorris Fetch from Thoracic Surgery. She elected to stay on on [place] until September, when she would return for repeat imaging and consultation with Thoracic Surgery.    On 01/28/21 she received a CT chest to monitor the pulmonary lesion and this showed an interval enlargement of the aggressive appearing left upper lobe pulmonary nodule which measured 1.7 x 1.2 x 1.2 cm. It was felt to likely represent a solitary metastatic lesion, however, the possibility of a primary bronchogenic neoplasm also warranted consideration, particularly in light of the morphology of the nodule. No new pulmonary nodules and no lymphadenopathy were noted in the thorax.   She was seen by Dr Dorris Fetch who recommended a VATS procedure.     On 03/09/21, she underwent Robotic  LUL segmentectomy with lymph node dissection with tear of the lingular arterial branch require thoracotomy to control bleeding. She was ultimately  discharged to home with home PT/OT and home health on 11/2. Final pathology revealed metastatic adenocarcinoma c/w known endometrial adenocarcinoma, negative lymph nodes. ER+.  Margins widely negative (1.6 cm and greater).   CT C/A/P on 08/03/21 revealed postoperative findings in the left harmonics including small left pleural effusion with enhancing pleural surfaces.  No abdominal or pelvic findings concerning for metastatic disease.  Interval History: Doing well.  Notes that breathing has significantly improved since her last visit with me.  She feels short of breath typically only when the weather is very muggy.  She continues to struggle with the vulvar irritation and pruritus.  Uses clobetasol with some relief.  Continues to have bladder incontinence and frequency, unchanged from baseline.  Reports normal bowel function.  Past Medical/Surgical History: Past Medical History:  Diagnosis Date   Anemia    Atypical mole 05/15/2005   Left Mid Outer Back (slight to moderate)   BCC (basal cell carcinoma of skin) 11/17/1991   Right Cheek (excision)   BCC (basal cell carcinoma of skin) 05/30/1994   Left Upper Forehead (curet and 5FU)   BCC (basal cell carcinoma of skin) 05/04/1997   Right upper Forehead/hairline (curet and excision)   BCC (basal cell carcinoma of skin) 06/25/1997   Right Upper Forehead/hairline (+margin) (MOH's)   BCC (basal cell carcinoma of skin) 11/12/2006   Left Cheek (curet and excision)   BCC (basal cell carcinoma of skin) 08/15/2009   Right Lower Leg (curet 5FU)   BCE (basal cell epithelioma), leg, right    Cataract 2021   Colon polyps    Coronary artery disease 2008   Coronary atherosclerosis    Diabetes mellitus without complication (HCC)    pre-diabetic   DVT of lower extremity (deep venous thrombosis) (HCC) 07/2010   endometrioid endometrial    GI bleed 2009   GIST (gastrointestinal stromal tumor), malignant (HCC)    T2N0   H pylori ulcer 2001   History of  radiation therapy 06/21/20-07/25/20   endometrium, IMRT     Dr. Roselind Messier   History of radiation therapy 08/17/2020, 08/23/2020, 08/25/2020   vaginal brachytherapy     Dr Antony Blackbird   Hypercholesteremia    Hyperlipidemia    Hypertension    per patient-on medication for preventative/has not diagnosed with HTN    Insomnia    Iron deficiency    Joint pain    Lung cancer (HCC)    Osteopenia    Reflux    SCCA (squamous cell carcinoma) of skin 05/15/2005   Left Chest (in situ)   SCCA (squamous cell carcinoma) of skin 10/24/2011   Left Side of Chest Med (in situ) (tx p bx)   SCCA (squamous cell carcinoma) of skin 01/28/2019   Right Forearm inf (Keratoacanthoma) (tx p bx)   Superficial basal cell carcinoma (BCC) 06/25/1995   Upper Forehead at scar (curet and 5FU)   Ulcer    epigastric     Past Surgical History:  Procedure Laterality Date   ABDOMINAL HYSTERECTOMY     AUGMENTATION MAMMAPLASTY     BREAST ENHANCEMENT SURGERY     since removed in 2016   BREAST SURGERY     CARDIAC CATHETERIZATION     pt claims she has never had a cardiac catheterization   CATARACT EXTRACTION Bilateral 2021   CHOLECYSTECTOMY     INTERCOSTAL NERVE BLOCK Left 03/09/2021  Procedure: INTERCOSTAL NERVE BLOCK;  Surgeon: Loreli Slot, MD;  Location[facility] OR;  Service: Thoracic;  Laterality: Left;   LEFT HEART CATH AND CORONARY ANGIOGRAPHY N/A 09/06/2021   Procedure: LEFT HEART CATH AND CORONARY ANGIOGRAPHY;  Surgeon: Tonny Bollman, MD;  Location: [facility] INVASIVE CV LAB;  Service: Cardiovascular;  Laterality: N/A;   LYMPH NODE DISSECTION Left 03/09/2021   Procedure: LYMPH NODE DISSECTION;  Surgeon: Loreli Slot, MD;  Location: [facility] OR;  Service: Thoracic;  Laterality: Left;   MOHS SURGERY Left 1994   At [facility]   PANCREATICODUODENECTOMY  06/27/2010   whipple    ROBOTIC ASSISTED TOTAL HYSTERECTOMY WITH BILATERAL SALPINGO OOPHERECTOMY Bilateral 10/02/2016   Procedure: XI ROBOTIC ASSISTED TOTAL HYSTERECTOMY  WITH BILATERAL SALPINGO OOPHORECTOMY WITH SENTINAL LYMPH NODE BIOPSY LYSIS OF ADHESIONS, EXPLORATORY LAPAROTOMY;  Surgeon: Adolphus Birchwood, MD;  Location: [facility] ORS;  Service: Gynecology;  Laterality: Bilateral;   THORACOTOMY Left 03/09/2021   Procedure: THORACOTOMY MAJOR;  Surgeon: Loreli Slot, MD;  Location: [facility] OR;  Service: Thoracic;  Laterality: Left;   TUBAL LIGATION     WHIPPLE PROCEDURE  2012    Family History  Problem Relation Age of Onset   Diabetes Mother    Dementia Mother    Hypertension Mother    Heart attack Father    Heart attack Brother    Cancer Brother        melanoma   Colon cancer Neg Hx    Breast cancer Neg Hx    Ovarian cancer Neg Hx    Endometrial cancer Neg Hx    Pancreatic cancer Neg Hx    Prostate cancer Neg Hx     Social History   Socioeconomic History   Marital status: Widowed    Spouse name: Not on file   Number of children: 1   Years of education: Not on file   Highest education level: Not on file  Occupational History   Occupation: accounting    Comment: retired  Tobacco Use   Smoking status: Never   Smokeless tobacco: Never  Vaping Use   Vaping Use: Never used  Substance and Sexual Activity   Alcohol use: No   Drug use: No   Sexual activity: Not Currently    Birth control/protection: Surgical  Other Topics Concern   Not on file  Social History Narrative   ** Merged History Encounter **       Social Determinants of Health   Financial Resource Strain: Not on file  Food Insecurity: Not on file  Transportation Needs: Not on file  Physical Activity: Not on file  Stress: Not on file  Social Connections: Not on file    Current Medications:  Current Outpatient Medications:    Apoaequorin (PREVAGEN) 10 MG CAPS, Take 10 mg by mouth daily., Disp: , Rfl:    Ascorbic Acid (VITAMIN C) 500 MG CAPS, Take 500 mg by mouth daily., Disp: , Rfl:    clobetasol ointment (TEMOVATE) 0.05 %, Apply 1 Application topically 2 (two) times a week.  Use this on vulva no more than 2 times a week at night. If symptoms get worse, stop using, Disp: 30 g, Rfl: 0   CREON 12000-38000 units CPEP capsule, Take 12,000 Units by mouth 3 (three) times daily before meals., Disp: , Rfl:    Cyanocobalamin (B-12 PO), Take 1 capsule by mouth daily., Disp: , Rfl:    dapagliflozin propanediol (FARXIGA) 10 MG TABS tablet, Take by mouth daily., Disp: , Rfl:    famotidine (  PEPCID) 20 MG tablet, Take 20 mg by mouth daily as needed., Disp: , Rfl:    magnesium gluconate (MAGONATE) 500 MG tablet, Take 500 mg by mouth 2 (two) times daily., Disp: , Rfl:    Melatonin 10 MG TABS, Take 10 mg by mouth at bedtime as needed (sleep)., Disp: , Rfl:    Multiple Vitamins-Minerals (ONE-A-DAY WOMENS 50+ ADVANTAGE PO), Take by mouth., Disp: , Rfl:    ONETOUCH VERIO test strip, daily., Disp: , Rfl:    VITAMIN D PO, Take 1 capsule by mouth daily., Disp: , Rfl:    vitamin E 180 MG (400 UNITS) capsule, Take 400 Units by mouth at bedtime., Disp: , Rfl:    zinc gluconate 50 MG tablet, Take 50 mg by mouth 2 (two) times daily., Disp: , Rfl:   Current Facility-Administered Medications:    sodium chloride flush (NS) 0.9 % injection 3 mL, 3 mL, Intravenous, Q12H, Croitoru, Mihai, MD  Review of Systems: + rash, incontinence Denies appetite changes, fevers, chills, fatigue, unexplained weight changes. Denies hearing loss, neck lumps or masses, mouth sores, ringing in ears or voice changes. Denies cough or wheezing.  Denies shortness of breath. Denies chest pain or palpitations. Denies leg swelling. Denies abdominal distention, pain, blood in stools, constipation, diarrhea, nausea, vomiting, or early satiety. Denies pain with intercourse, dysuria, frequency, hematuria. Denies hot flashes, pelvic pain, vaginal bleeding or vaginal discharge.   Denies joint pain, back pain or muscle pain/cramps. Denies itching or wounds. Denies dizziness, headaches, numbness or seizures. Denies swollen lymph  nodes or glands, denies easy bruising or bleeding. Denies anxiety, depression, confusion, or decreased concentration.  Physical Exam: BP 130/62 (BP Location: Left Arm, Patient Position: Sitting)   Pulse (!) 55   Temp 97.8 F (36.6 C) (Oral)   Resp 18   Ht 5' 4.17" (1.63 m)   Wt 120 lb (54.4 kg)   SpO2 99%   BMI 20.49 kg/m  General: Alert, oriented, no acute distress. HEENT: Normocephalic, atraumatic, sclera anicteric. Chest: Clear to auscultation bilaterally.  No wheezes or rhonchi. Cardiovascular: Regular rate and rhythm, no murmurs. Abdomen: soft, nontender.  Normoactive bowel sounds.  No masses or hepatosplenomegaly appreciated.  Well-healed incisions. Extremities: Grossly normal range of motion.  Warm, well perfused.  No edema bilaterally. Skin: No rashes or lesions noted. Lymphatics: No cervical, supraclavicular, or inguinal adenopathy. GU: Erythema of the vulva, especially posterior vulva with loss of architecture of the labia posteriorly.  Posterior vulva has the appearance of tissue paper, findings continue to be suspicious for lichen sclerosis. Speculum exam reveals agglutination of the upper vagina, can only place the speculum approximately 2-3 cm within the vagina.  Vaginal mucosa is atrophic with radiation changes noted.  No masses seen.  On reactal exam, no masses or nodularity.  Laboratory & Radiologic Studies: None new  Assessment & Plan: [name] is a 82 y.o. woman with a history of recurrent endometrioid endometrial cancer at the vaginal cuff and posterior vaginal wall (MMR deficient, ER/PR positive) diagnosed in December, 2021 after initial staging surgery/diagnosis in May, 2018. S/p salvage RT to pelvis and vagina. Most recently had oligometastatic disease within the left upper lung status post resection in October 2022 complicated by bleeding.  Pathology confirms recurrent disease, margins widely negative.   Last imaging 03/2022 was negative for recurrent  or metastatic disease.   Patient presents today for surveillance visit.  She is NED and breathing has continued to improve since her last visit with me.  We have previously discussed antiestrogen agent.  Given her health issues after surgery, she declined stating such treatment at her last several visits.   Continues to have symptoms related to her vulva with skin changes that appear to be consistent with lichen sclerosis.  She is using clobetasol with some relief.  Also discussed using a barrier cream such as Vaseline.  I have asked her to let me know when she is back this summer after traveling.  If she continues to have symptoms without much relief, I recommend that she come in so that we can biopsy her vulva..   We will continue with 20-month surveillance visits alternating between our clinic and radiation oncology. Patient is scheduled to see radiation oncology in 12/2022.  I will see her back in November.  20 minutes of total time was spent for this patient encounter, including preparation, face-to-face counseling with the patient and coordination of care, and documentation of the encounter.  Eugene Garnet, MD  Division of Gynecologic Oncology  Department of Obstetrics and Gynecology  [facility]

## 2022-10-12 NOTE — Patient Instructions (Addendum)
It was good to see you today.  I do not see or feel any evidence of cancer recurrence on your exam.  Please let me know how your vulvar symptoms are once you are back in town.  I will see you for follow-up in 6 months.  As always, if you develop any new and concerning symptoms before your next visit, please call to see me sooner.

## 2022-10-28 IMAGING — MG DIGITAL SCREENING BILAT W/ TOMO W/ CAD
6 of 10 series · 6 of 30 positions shown · non-contrast
Comparison: Previous exam(s).

CLINICAL DATA: Screening.

EXAM:
DIGITAL SCREENING BILATERAL MAMMOGRAM WITH TOMO AND CAD

[R CC synth-2D]
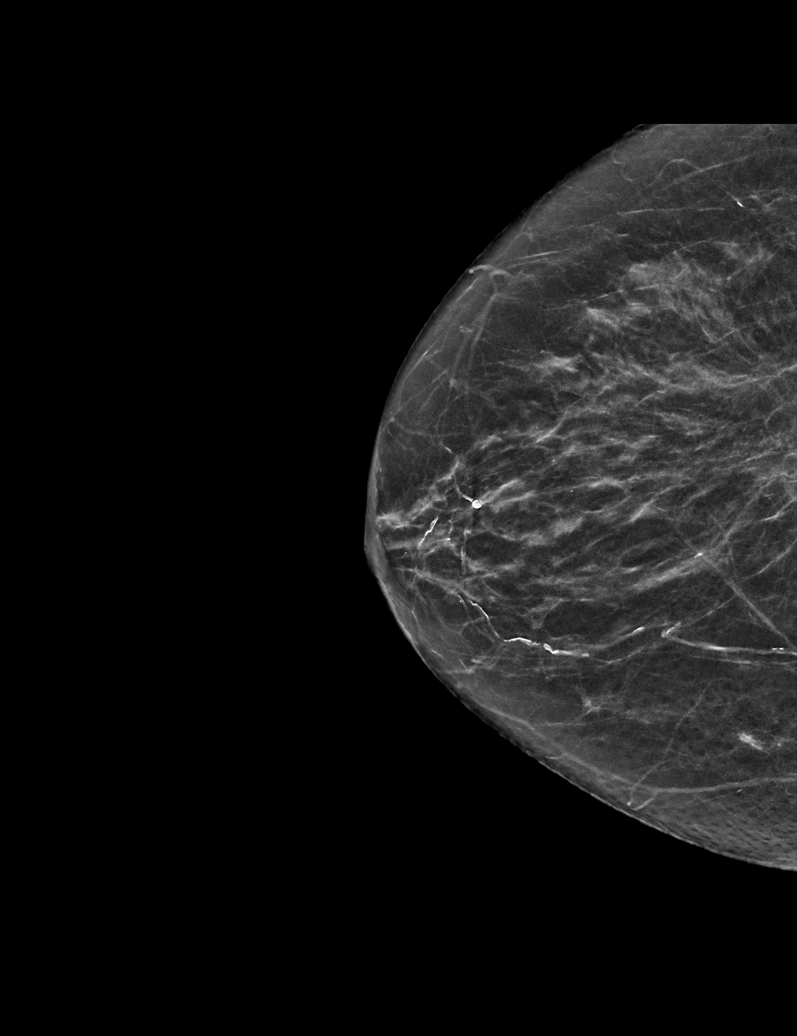

[L CC synth-2D]
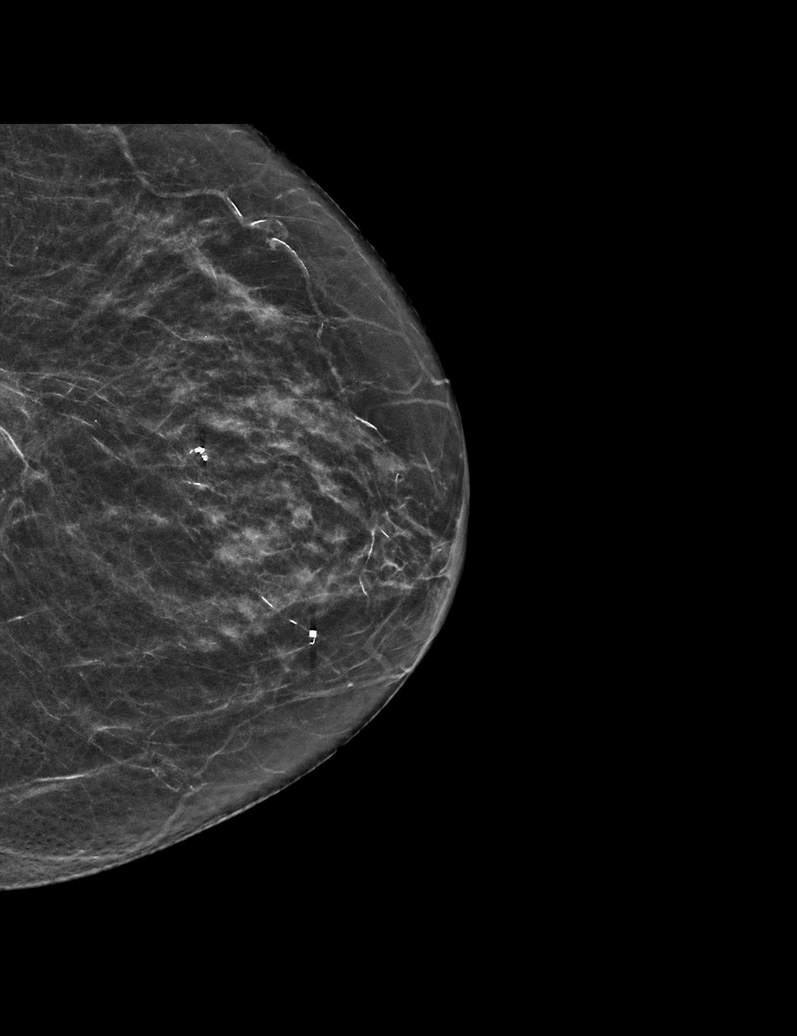

[L MLO synth-2D (1 of 2)]
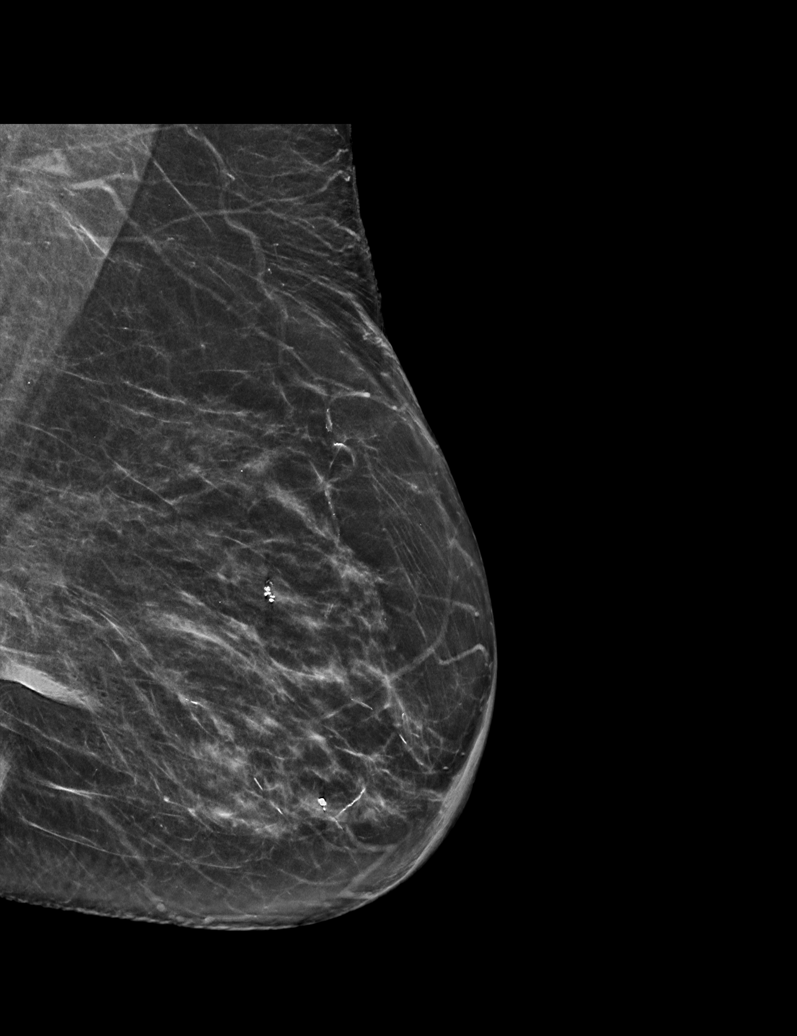

[L MLO synth-2D (2 of 2)]
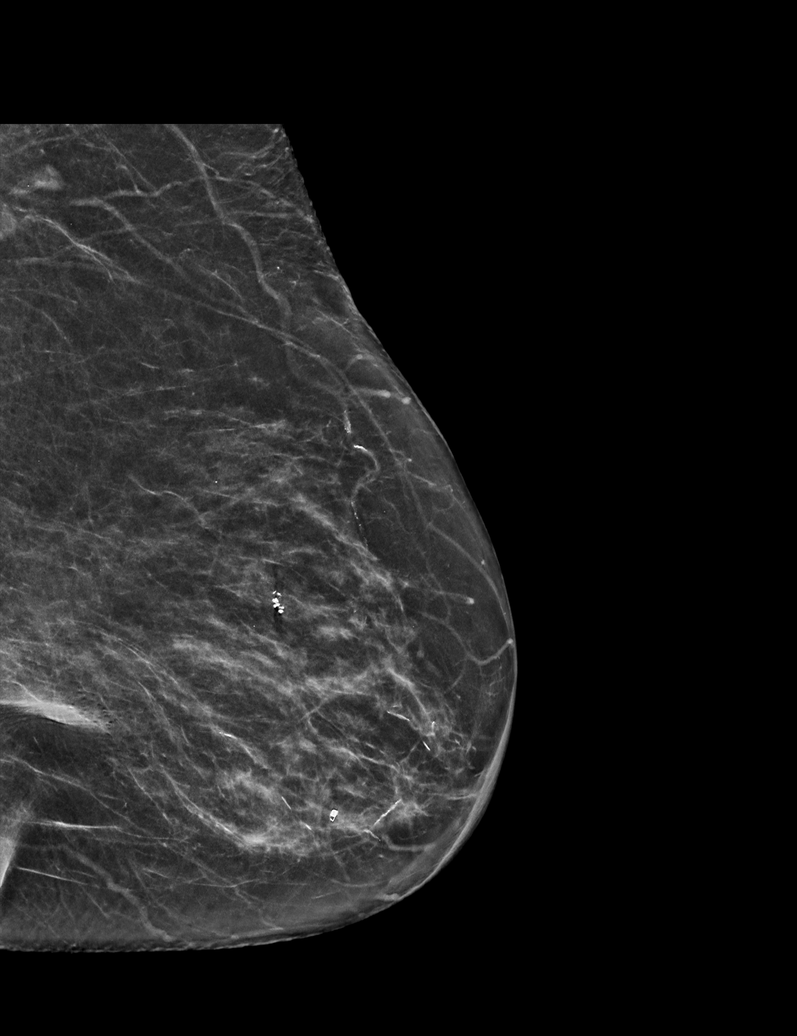

[R MLO synth-2D]
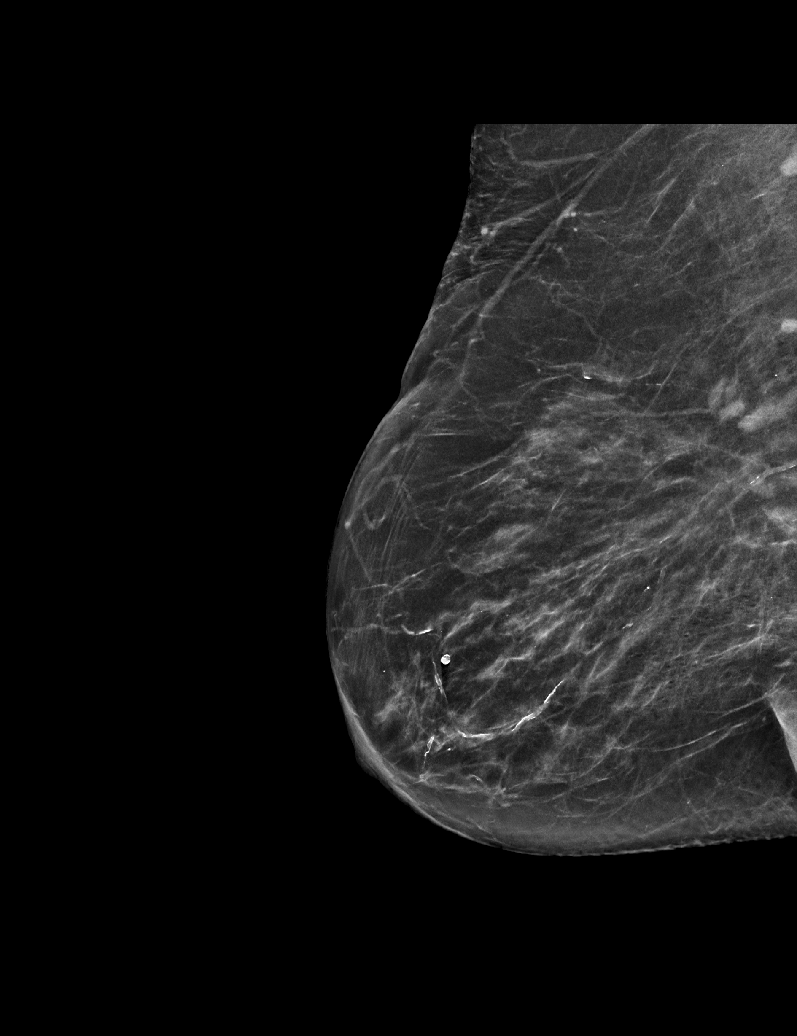

[L MLO tomo · tomo slice 29/57.0]
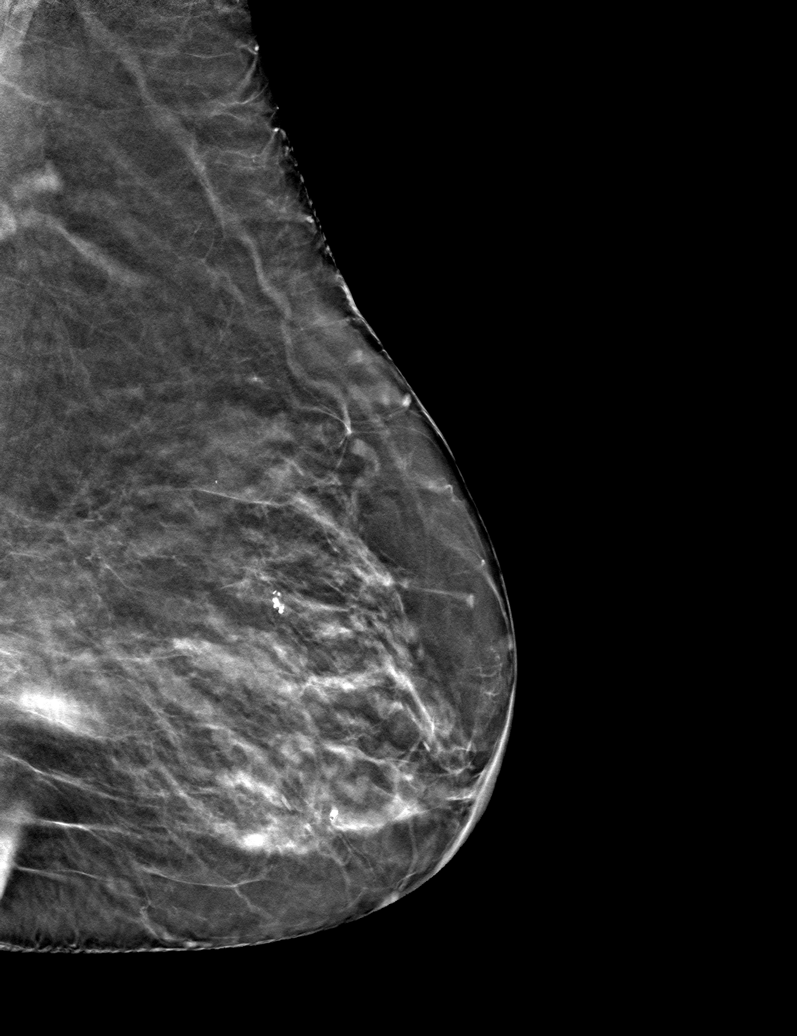

[6 of 30 positions shown; findings below may reference images not displayed]

ACR Breast Density Category c: The breast tissue is heterogeneously
dense, which may obscure small masses.
FINDINGS: There are no findings suspicious for malignancy. Images were
processed with CAD.
IMPRESSION: No mammographic evidence of malignancy. A result letter of this
screening mammogram will be mailed directly to the patient.

RECOMMENDATION:
Screening mammogram in one year. (Code:[id number]-U-LHB)

BI-RADS CATEGORY  1: Negative.

## 2023-01-03 ENCOUNTER — Ambulatory Visit: Payer: PPO | Admitting: Radiation Oncology

## 2023-02-01 ENCOUNTER — Telehealth: Payer: Self-pay | Admitting: *Deleted

## 2023-02-01 NOTE — Telephone Encounter (Signed)
Spoke with Ms. Fooshee and patient was scheduled for an appointment with [name], NP next Friday, September 13 th at 1 pm with 12:45 arrival time for check in. Patient agreed to date and time and advised if symptoms worsen to seek evaluation at ED. Pt verbalized understanding.

## 2023-02-01 NOTE — Telephone Encounter (Signed)
Spoke with Ms. Manjarrez who called the office stating she is having some bleeding that started this past Tuesday, Sept. 3 rd. And she is not sure if it's vaginal or rectal. Patient states the bleeding stopped on Wednesday, started again on Thursday and has not returned as of this morning.   Patient describes the bleeding as red, heavy with very small dark red clots. Pt states she has had some spotting all summer long that has come and gone. Pt still has vulva redness and tenderness and has had some constipation which she has only had mild straining. Patient denies being sexually active, is not taking any hormonal medications, and denies fever, chills and pelvic pain.   Patient has been experiencing some fatigue and weakness and mild urgency when she has to empty her bladder and have a bowel movement. Denies pain and burning on urination. Pt has a follow up appointment with Dr. Pricilla Holm on Friday, November 15 th at 1:15 pm and would like to see about an earlier appt. Pt states she is in [place] this week and will return to [place] on Saturday, Sept. 7 th. Advised patient that her message would be forwarded to provider, and if her bleeding were to increase and become heavier and not subside and or any symptoms discussed above to seek evaluation at ED. Patient states she is with her sister who is a retired Engineer, civil (consulting). Pt advised office would call back with recommendations.

## 2023-02-01 NOTE — Telephone Encounter (Signed)
Yes, please schedule sooner. If not time on my schedule, please discuss having her see Melissa on a day I'm here

## 2023-02-06 ENCOUNTER — Encounter: Payer: Self-pay | Admitting: Gynecologic Oncology

## 2023-02-08 ENCOUNTER — Inpatient Hospital Stay: Payer: PPO | Attending: Gynecologic Oncology | Admitting: Gynecologic Oncology

## 2023-02-08 ENCOUNTER — Inpatient Hospital Stay: Payer: PPO

## 2023-02-08 ENCOUNTER — Telehealth: Payer: Self-pay | Admitting: *Deleted

## 2023-02-08 VITALS — BP 136/50 | HR 53 | Temp 98.4°F | Resp 16 | Wt 128.0 lb

## 2023-02-08 DIAGNOSIS — Z90722 Acquired absence of ovaries, bilateral: Secondary | ICD-10-CM | POA: Diagnosis not present

## 2023-02-08 DIAGNOSIS — R0602 Shortness of breath: Secondary | ICD-10-CM | POA: Diagnosis not present

## 2023-02-08 DIAGNOSIS — C541 Malignant neoplasm of endometrium: Secondary | ICD-10-CM

## 2023-02-08 DIAGNOSIS — N950 Postmenopausal bleeding: Secondary | ICD-10-CM | POA: Insufficient documentation

## 2023-02-08 DIAGNOSIS — Z923 Personal history of irradiation: Secondary | ICD-10-CM | POA: Diagnosis not present

## 2023-02-08 DIAGNOSIS — Z90710 Acquired absence of both cervix and uterus: Secondary | ICD-10-CM | POA: Insufficient documentation

## 2023-02-08 DIAGNOSIS — Z8542 Personal history of malignant neoplasm of other parts of uterus: Secondary | ICD-10-CM | POA: Diagnosis not present

## 2023-02-08 DIAGNOSIS — R531 Weakness: Secondary | ICD-10-CM | POA: Diagnosis not present

## 2023-02-08 DIAGNOSIS — R58 Hemorrhage, not elsewhere classified: Secondary | ICD-10-CM

## 2023-02-08 LAB — COMPREHENSIVE METABOLIC PANEL
ALT: 14 U/L (ref 0–44)
AST: 20 U/L (ref 15–41)
Albumin: 4 g/dL (ref 3.5–5.0)
Alkaline Phosphatase: 109 U/L (ref 38–126)
Anion gap: 8 (ref 5–15)
BUN: 16 mg/dL (ref 8–23)
CO2: 26 mmol/L (ref 22–32)
Calcium: 9.1 mg/dL (ref 8.9–10.3)
Chloride: 107 mmol/L (ref 98–111)
Creatinine, Ser: 0.86 mg/dL (ref 0.44–1.00)
GFR, Estimated: >60 mL/min (ref >60)
Glucose, Bld: 227 mg/dL — ABNORMAL HIGH (ref 70–99)
Potassium: 4.6 mmol/L (ref 3.5–5.1)
Sodium: 141 mmol/L (ref 135–145)
Total Bilirubin: 0.5 mg/dL (ref 0.3–1.2)
Total Protein: 7.1 g/dL (ref 6.5–8.1)

## 2023-02-08 LAB — CBC (CANCER CENTER ONLY)
HCT: 43.2 % (ref 36.0–46.0)
Hemoglobin: 14.5 g/dL (ref 12.0–15.0)
MCH: 32.2 pg (ref 26.0–34.0)
MCHC: 33.6 g/dL (ref 30.0–36.0)
MCV: 95.8 fL (ref 80.0–100.0)
Platelet Count: 185 10*3/uL (ref 150–400)
RBC: 4.51 MIL/uL (ref 3.87–5.11)
RDW: 13.2 % (ref 11.5–15.5)
WBC Count: 5.8 10*3/uL (ref 4.0–10.5)
nRBC: 0 % (ref 0.0–0.2)

## 2023-02-08 NOTE — Patient Instructions (Addendum)
We will plan on obtaining labs today and will contact you with the results.  There was no obvious site for the bleeding on today's exam. We do feel you need to have your colonoscopy as soon as able for further evaluation.   We will plan on a CT scan to evaluate for signs of return of cancer given your symptoms of weakness, bleeding and will contact you with the results. NOTHING TO EAT OR DRINK 4 HOURS BEFORE YOUR CT SCAN.

## 2023-02-08 NOTE — Progress Notes (Signed)
Gynecologic Oncology Symptom Management Visit  02/13/23  Reason for Visit: Vaginal bleeding, weakness   Treatment History: [name] is a 82 year old woman with a history of stage IA grade 1 endometrial cancer in 2018 who was seen in consultation at the request of Dr Connye Burkitt for evaluation of a vaginal mass.   The patient underwent surgical staging for her endometrial cancer in May 2018.  This included a robotic total hysterectomy with BSO and sentinel lymph node biopsy.  Her surgery was complicated by extensive adhesions from her prior Whipple procedure.   Final pathology revealed a grade 1 endometrioid endometrial adenocarcinoma that measured 4 cm.  There was inner half myometrial invasion and negative sentinel lymph nodes with no lymphovascular space invasion.  She was characterizes having low risk endometrial cancer and therefore no adjuvant therapy was recommended in accordance with NCCN guidelines. MMR testing was not performed at that time, but later requested revealing loss of expression of MLH1 and PMS2 (MSI high), and ER /PR strongly positive.   She received subsequent follow-up with her benign gynecologist and was lost to follow-up with [place].   In early December 2021 she began experiencing postmenopausal bleeding.  She reported this to her primary care doctor who attempted speculum exam which was unsuccessful due to discomfort.  The patient was then seen by Dr. Connye Burkitt who performed a limited speculum exam due to patient discomfort and recognized a vaginal mass.  She subsequently referred the patient to see GYN oncology.   The patient was then seen by me on 05/13/20 and on vaginal examination the posterior left vaginal wall/apex was replaced by tumor. Biopsy was performed which confirmed recurrent endometrioid endometrial cancer.    She received restaging with PET scan on 06/18/20 which revealed hypermetabolism noted in the vaginal cuff and upper 1/3 of the vagina consistent with  known neoplasm. No locoregional lymphadenopathy or distant metastatic disease was seen. This suggested a localized pelvic recurrence and treatment with salvage radiation was recommended.   She received salvage radiation between 06/21/2020 through 08/25/2020.  This included external beam radiation with IMRT to the vagina and pelvis, total dose 45 Gy.  Additionally received she received vaginal and pelvic boost with HDR brachytherapy total dose of 24 Gy.   After being seen for post-treatment surveillance in June, 2022, she was scheduled to have a post-treatment surveillance/evaluation PET scan on 11/11/20. This revealed complete resolution at the vaginal/pelvic recurrence site, however there was a new 12 mm posterior left upper lobe pulmonary nodule/metastasis seen.   The patient then immediately traveled to [place] to visit family for 3 months. Her case was presented at the thoracic multidisciplinary conference and the recommendation was made for referral/consultation with Dr Dorris Fetch from Thoracic Surgery. She elected to stay on on [place] until September, when she would return for repeat imaging and consultation with Thoracic Surgery.    On 01/28/21 she received a CT chest to monitor the pulmonary lesion and this showed an interval enlargement of the aggressive appearing left upper lobe pulmonary nodule which measured 1.7 x 1.2 x 1.2 cm. It was felt to likely represent a solitary metastatic lesion, however, the possibility of a primary bronchogenic neoplasm also warranted consideration, particularly in light of the morphology of the nodule. No new pulmonary nodules and no lymphadenopathy were noted in the thorax.   She was seen by Dr Dorris Fetch who recommended a VATS procedure.     On 03/09/21, she underwent Robotic LUL segmentectomy with lymph node dissection with  tear of the lingular arterial branch require thoracotomy to control bleeding. She was ultimately discharged to home with home PT/OT and home  health on 11/2. Final pathology revealed metastatic adenocarcinoma c/w known endometrial adenocarcinoma, negative lymph nodes. ER+.  Margins widely negative (1.6 cm and greater).   CT C/A/P on 08/03/21 revealed postoperative findings in the left harmonics including small left pleural effusion with enhancing pleural surfaces.  No abdominal or pelvic findings concerning for metastatic disease.  Last CT CAP was on 04/07/2022 revealing no acute or findings of recurrence of metastatic disease in the chest, abdomen, or pelvis, thickened appearance of the cecal wall, stable post-op changes, and aortic atherosclerosis.  She did undergo a GI workup at [facility] including a colonoscopy on 05/15/2022 with cecal biopsies returning with "colonic mucosa with no histopathologic abnormality."  Interval History: Patient presents to the office today for evaluation of vaginal bleeding.  She states she has noticed generalized weakness starting about 4 to 5 weeks ago.  She states on September 3, she woke up feeling weak.  She had a more firm bowel movement and went to the kitchen to begin making breakfast.  She states she started to get chilled alternating with hot flashes and became weak with a feeling of throwing up.  She then went to the bathroom and noticed that she was having bleeding, heavy like a period with tissue and clots.  She describes the bleeding as bright red.  She states she felt better after she ate some breakfast.  She had the same thing occur on Thursday of that week with bleeding again.  There was no recent bowel movement around the time of the second bleeding episode.  She was unsure if the bleeding was vaginally or rectally. She did not have emesis but is felt the same weakness with chills and hot flashes.  She states she has been short of breath more but has been in [place] with significant heat. She has been eating more and has gained weight with weight currently at 128. She was supposed to have a follow up  colonoscopy earlier this year due to having an inadequate prep with the last. She continues to use the clobetasol with Vaseline as needed for her vulvar symptoms.  Past Medical/Surgical History: Past Medical History:  Diagnosis Date   Anemia    Atypical mole 05/15/2005   Left Mid Outer Back (slight to moderate)   BCC (basal cell carcinoma of skin) 11/17/1991   Right Cheek (excision)   BCC (basal cell carcinoma of skin) 05/30/1994   Left Upper Forehead (curet and 5FU)   BCC (basal cell carcinoma of skin) 05/04/1997   Right upper Forehead/hairline (curet and excision)   BCC (basal cell carcinoma of skin) 06/25/1997   Right Upper Forehead/hairline (+margin) (MOH's)   BCC (basal cell carcinoma of skin) 11/12/2006   Left Cheek (curet and excision)   BCC (basal cell carcinoma of skin) 08/15/2009   Right Lower Leg (curet 5FU)   BCE (basal cell epithelioma), leg, right    Cataract 2021   Colon polyps    Coronary artery disease 2008   Coronary atherosclerosis    Diabetes mellitus without complication (HCC)    pre-diabetic   DVT of lower extremity (deep venous thrombosis) (HCC) 07/2010   endometrioid endometrial    GI bleed 2009   GIST (gastrointestinal stromal tumor), malignant (HCC)    T2N0   H pylori ulcer 2001   History of radiation therapy 06/21/20-07/25/20   endometrium, IMRT  Dr. Roselind Messier   History of radiation therapy 08/17/2020, 08/23/2020, 08/25/2020   vaginal brachytherapy     Dr Antony Blackbird   Hypercholesteremia    Hyperlipidemia    Hypertension    per patient-on medication for preventative/has not diagnosed with HTN    Insomnia    Iron deficiency    Joint pain    Lung cancer (HCC)    Osteopenia    Reflux    SCCA (squamous cell carcinoma) of skin 05/15/2005   Left Chest (in situ)   SCCA (squamous cell carcinoma) of skin 10/24/2011   Left Side of Chest Med (in situ) (tx p bx)   SCCA (squamous cell carcinoma) of skin 01/28/2019   Right Forearm inf (Keratoacanthoma) (tx  p bx)   Superficial basal cell carcinoma (BCC) 06/25/1995   Upper Forehead at scar (curet and 5FU)   Ulcer    epigastric     Past Surgical History:  Procedure Laterality Date   ABDOMINAL HYSTERECTOMY     AUGMENTATION MAMMAPLASTY     BREAST ENHANCEMENT SURGERY     since removed in 2016   BREAST SURGERY     CARDIAC CATHETERIZATION     pt claims she has never had a cardiac catheterization   CATARACT EXTRACTION Bilateral 2021   CHOLECYSTECTOMY     INTERCOSTAL NERVE BLOCK Left 03/09/2021   Procedure: INTERCOSTAL NERVE BLOCK;  Surgeon: Loreli Slot, MD;  Location: [facility] OR;  Service: Thoracic;  Laterality: Left;   LEFT HEART CATH AND CORONARY ANGIOGRAPHY N/A 09/06/2021   Procedure: LEFT HEART CATH AND CORONARY ANGIOGRAPHY;  Surgeon: Tonny Bollman, MD;  Location: [facility] INVASIVE CV LAB;  Service: Cardiovascular;  Laterality: N/A;   LYMPH NODE DISSECTION Left 03/09/2021   Procedure: LYMPH NODE DISSECTION;  Surgeon: Loreli Slot, MD;  Location: [facility] OR;  Service: Thoracic;  Laterality: Left;   MOHS SURGERY Left 1994   At [facility]   PANCREATICODUODENECTOMY  06/27/2010   whipple    ROBOTIC ASSISTED TOTAL HYSTERECTOMY WITH BILATERAL SALPINGO OOPHERECTOMY Bilateral 10/02/2016   Procedure: XI ROBOTIC ASSISTED TOTAL HYSTERECTOMY WITH BILATERAL SALPINGO OOPHORECTOMY WITH SENTINAL LYMPH NODE BIOPSY LYSIS OF ADHESIONS, EXPLORATORY LAPAROTOMY;  Surgeon: Adolphus Birchwood, MD;  Location: [facility] ORS;  Service: Gynecology;  Laterality: Bilateral;   THORACOTOMY Left 03/09/2021   Procedure: THORACOTOMY MAJOR;  Surgeon: Loreli Slot, MD;  Location: [facility] OR;  Service: Thoracic;  Laterality: Left;   TUBAL LIGATION     WHIPPLE PROCEDURE  2012    Family History  Problem Relation Age of Onset   Diabetes Mother    Dementia Mother    Hypertension Mother    Heart attack Father    Heart attack Brother    Cancer Brother        melanoma   Colon cancer Neg Hx    Breast cancer Neg Hx    Ovarian cancer  Neg Hx    Endometrial cancer Neg Hx    Pancreatic cancer Neg Hx    Prostate cancer Neg Hx     Social History   Socioeconomic History   Marital status: Widowed    Spouse name: Not on file   Number of children: 1   Years of education: Not on file   Highest education level: Not on file  Occupational History   Occupation: accounting    Comment: retired  Tobacco Use   Smoking status: Never   Smokeless tobacco: Never  Vaping Use   Vaping status: Never Used  Substance and Sexual Activity  Alcohol use: No   Drug use: No   Sexual activity: Not Currently    Birth control/protection: Surgical  Other Topics Concern   Not on file  Social History Narrative   ** Merged History Encounter **       Social Determinants of Health   Financial Resource Strain: Not on file  Food Insecurity: Not on file  Transportation Needs: Not on file  Physical Activity: Not on file  Stress: Not on file  Social Connections: Unknown (10/06/2021)   Received from [facility]   Social Network    Social Network: Not on file    Current Medications:  Current Outpatient Medications:    Apoaequorin (PREVAGEN) 10 MG CAPS, Take 10 mg by mouth daily., Disp: , Rfl:    Ascorbic Acid (VITAMIN C) 500 MG CAPS, Take 500 mg by mouth daily., Disp: , Rfl:    clobetasol ointment (TEMOVATE) 0.05 %, Apply 1 Application topically 2 (two) times a week. Use this on vulva no more than 2 times a week at night. If symptoms get worse, stop using, Disp: 30 g, Rfl: 0   CREON 12000-38000 units CPEP capsule, Take 12,000 Units by mouth 3 (three) times daily before meals., Disp: , Rfl:    Cyanocobalamin (B-12 PO), Take 1 capsule by mouth daily., Disp: , Rfl:    dapagliflozin propanediol (FARXIGA) 10 MG TABS tablet, Take by mouth daily., Disp: , Rfl:    famotidine (PEPCID) 20 MG tablet, Take 20 mg by mouth daily as needed., Disp: , Rfl:    magnesium gluconate (MAGONATE) 500 MG tablet, Take 500 mg by mouth 2 (two) times  daily., Disp: , Rfl:    Melatonin 10 MG TABS, Take 10 mg by mouth at bedtime as needed (sleep)., Disp: , Rfl:    Multiple Vitamins-Minerals (ONE-A-DAY WOMENS 50+ ADVANTAGE PO), Take by mouth., Disp: , Rfl:    ONETOUCH VERIO test strip, daily., Disp: , Rfl:    VITAMIN D PO, Take 1 capsule by mouth daily., Disp: , Rfl:    vitamin E 180 MG (400 UNITS) capsule, Take 400 Units by mouth at bedtime., Disp: , Rfl:    zinc gluconate 50 MG tablet, Take 50 mg by mouth 2 (two) times daily., Disp: , Rfl:   Current Facility-Administered Medications:    sodium chloride flush (NS) 0.9 % injection 3 mL, 3 mL, Intravenous, Q12H, Croitoru, Mihai, MD  Review of Systems: + weakness, shortness of breath, bleeding vaginal vs rectally Denies appetite changes, fevers, chills, unexplained weight changes. Denies hearing loss, neck lumps or masses, mouth sores, ringing in ears or voice changes. Denies cough or wheezing.  + shortness of breath. Denies chest pain or palpitations. Denies leg swelling. Denies abdominal distention, pain, blood in stools, constipation, diarrhea, nausea/vomiting (except with bleeding episode), or early satiety. Denies pain with intercourse, dysuria, frequency, hematuria. Denies hot flashes, pelvic pain, vaginal discharge besides the bleeding episodes.   Denies joint pain, back pain or muscle pain/cramps. Denies itching or wounds. Denies dizziness, headaches, numbness or seizures. Denies swollen lymph nodes or glands, denies easy bruising or bleeding. Denies new symptoms of anxiety, depression, confusion, or decreased concentration.  Physical Exam: BP (!) 136/50 (BP Location: Left Arm, Patient Position: Sitting)   Pulse (!) 53   Temp 98.4 F (36.9 C) (Oral)   Resp 16   Wt 128 lb (58.1 kg)   SpO2 99%   BMI 21.85 kg/m  General: Alert, oriented, no acute distress. HEENT: Normocephalic, atraumatic, sclera anicteric. Chest:  Clear to auscultation bilaterally.  No wheezes or  rhonchi. Cardiovascular: Regular rate and rhythm, no murmurs. Abdomen: soft, nontender.  Normoactive bowel sounds.  No masses or hepatosplenomegaly appreciated.  Well-healed incisions without masses or nodularity. Extremities: Grossly normal range of motion.  Warm, well perfused.  No edema bilaterally. Skin: No rashes or lesions noted. Lymphatics: No cervical, supraclavicular, or inguinal adenopathy. GU: Erythema of the vulva, especially posterior vulva with loss of architecture of the labia posteriorly.  Posterior vulva continues to be suspicious for lichen sclerosis. Speculum exam reveals agglutination of the upper vagina, can only place the speculum approximately 2-3 cm within the vagina.  Vaginal mucosa is atrophic with radiation changes noted.  No masses seen. More prominent superficial blood vessels from radiation changes but no obvious signs of bleeding on the vulva, vagina, or the anus. On rectal exam, no masses or nodularity, stool in the vault.  Laboratory & Radiologic Studies: CBC    Component Value Date/Time   WBC 5.8 02/08/2023 1352   WBC 4.6 09/06/2021 1221   RBC 4.51 02/08/2023 1352   HGB 14.5 02/08/2023 1352   HGB 11.5 (L) 10/24/2016 1551   HCT 43.2 02/08/2023 1352   HCT 36.1 10/24/2016 1551   PLT 185 02/08/2023 1352   PLT 240 10/24/2016 1551   MCV 95.8 02/08/2023 1352   MCV 94.0 10/24/2016 1551   MCH 32.2 02/08/2023 1352   MCHC 33.6 02/08/2023 1352   RDW 13.2 02/08/2023 1352   RDW 15.5 (H) 10/24/2016 1551   LYMPHSABS 1,389 05/02/2021 1625   LYMPHSABS 1.4 10/24/2016 1551   MONOABS 1.2 (H) 03/18/2021 0500   MONOABS 0.4 10/24/2016 1551   EOSABS 92 05/02/2021 1625   EOSABS 0.2 10/24/2016 1551   BASOSABS 37 05/02/2021 1625   BASOSABS 0.0 10/24/2016 1551       Latest Ref Rng & Units 02/08/2023    1:52 PM 04/07/2022   11:09 AM 08/23/2021   11:52 AM  CMP  Glucose 70 - 99 mg/dL 027   253   BUN 8 - 23 mg/dL 16   23   Creatinine 6.64 - 1.00 mg/dL 4.03  4.74  2.59    Sodium 135 - 145 mmol/L 141   139   Potassium 3.5 - 5.1 mmol/L 4.6   3.9   Chloride 98 - 111 mmol/L 107   98   CO2 22 - 32 mmol/L 26   18   Calcium 8.9 - 10.3 mg/dL 9.1   9.5   Total Protein 6.5 - 8.1 g/dL 7.1     Total Bilirubin 0.3 - 1.2 mg/dL 0.5     Alkaline Phos 38 - 126 U/L 109     AST 15 - 41 U/L 20     ALT 0 - 44 U/L 14        Assessment & Plan: JONEISHA DEMANN is a 82 y.o. woman with a history of recurrent endometrioid endometrial cancer at the vaginal cuff and posterior vaginal wall (MMR deficient, ER/PR positive) diagnosed in December, 2021 after initial staging surgery/diagnosis in May, 2018. S/p salvage RT to pelvis and vagina. Most recently had oligometastatic disease within the left upper lung status post resection in October 2022 complicated by bleeding.  Pathology confirms recurrent disease, margins widely negative.  Last imaging 03/2022 was negative for recurrent or metastatic disease. At previous visits, starting an antiestrogen agent was discussed by Dr. Pricilla Holm and given her health issues after surgery, she declined starting such treatment at her last several visits.  Patient presents today for evaluation of bleeding (unsure if vaginal or rectal) episodes x 2 while out of state.  No obvious origin of bleeding noted on pelvic examination. Given generalized symptoms as well of increased weakness, increased shortness of breath, plan for labs today. Also given her history with presenting symptoms, plan for CT scan to evaluate for signs of metastatic disease. Dr. Pricilla Holm to the room as well to discuss plan.    We will follow up with the patient once the labs and CT scan results are available. If negative,we will continue with 64-month surveillance visits alternating between our clinic and radiation oncology.  20 minutes of total time was spent for this patient encounter, including preparation, face-to-face counseling with the patient and coordination of care, and documentation  of the encounter.  [name] NP [facility] GYN Oncology

## 2023-02-08 NOTE — Telephone Encounter (Signed)
-----   Message from Doylene Bode sent at 02/08/2023  3:02 PM EDT ----- Please call the patient and let her know her blood count (hgb, hct) are stable and higher than what they were previously. Now at 14.5 and Hct 43.2. On her metabolic panel, her electrolytes, kidney function, and liver function are normal. Her glucose is elevated at 227. Has she been checking her sugars at home?

## 2023-02-08 NOTE — Telephone Encounter (Signed)
Attempted to reach [name] in regards to her lab results. Left voicemail requesting call back.

## 2023-02-11 ENCOUNTER — Ambulatory Visit (INDEPENDENT_AMBULATORY_CARE_PROVIDER_SITE_OTHER): Payer: PPO

## 2023-02-11 DIAGNOSIS — C541 Malignant neoplasm of endometrium: Secondary | ICD-10-CM | POA: Diagnosis not present

## 2023-02-11 DIAGNOSIS — R58 Hemorrhage, not elsewhere classified: Secondary | ICD-10-CM | POA: Diagnosis not present

## 2023-02-11 DIAGNOSIS — R531 Weakness: Secondary | ICD-10-CM

## 2023-02-11 DIAGNOSIS — C49A0 Gastrointestinal stromal tumor, unspecified site: Secondary | ICD-10-CM | POA: Diagnosis not present

## 2023-02-11 DIAGNOSIS — I700 Atherosclerosis of aorta: Secondary | ICD-10-CM | POA: Diagnosis not present

## 2023-02-11 MED ORDER — IOHEXOL 300 MG/ML  SOLN
100.0000 mL | Freq: Once | INTRAMUSCULAR | Status: AC | PRN
  Administered 2023-02-11: 100 mL via INTRAVENOUS

## 2023-02-11 MED ORDER — IOHEXOL 9 MG/ML PO SOLN: 500.0000 mL | ORAL | Status: AC

## 2023-02-11 NOTE — Telephone Encounter (Signed)
Spoke with [name] who called the office back. Relayed message from [name], NP that her blood count is stable and higher than what they were previously. On metabolic panel, electrolytes, kidney function and liver function are normal.  Glucose is elevated at 227. Pt states she has been checking her blood sugars but recently ran out of test strips but is getting  more. Advised patient to reach out to her PCP if her blood sugars remain elevated. Pt verbalized understanding and thanked the office for calling.

## 2023-02-12 ENCOUNTER — Telehealth: Payer: Self-pay | Admitting: *Deleted

## 2023-02-12 NOTE — Telephone Encounter (Signed)
Attempted to reach Ms. Fails in regards to her CT scan that has not been read yet. Left voicemail for pt that we would call as soon as it has been read.

## 2023-02-15 ENCOUNTER — Telehealth: Payer: Self-pay | Admitting: *Deleted

## 2023-02-15 ENCOUNTER — Telehealth: Payer: Self-pay | Admitting: Gynecologic Oncology

## 2023-02-15 NOTE — Telephone Encounter (Signed)
Spoke with Ms. Mutchler and relayed below message from [name], NP with CT results and Dr. Pricilla Holm is recommending further evaluation and testing with PCP regarding generalized weakness. Pt verbalized understanding and thanked the office for calling.

## 2023-02-15 NOTE — Telephone Encounter (Signed)
Called patient to discuss CT scan results. Left message asking her to please call the office.

## 2023-02-15 NOTE — Telephone Encounter (Signed)
-----   Message from Doylene Bode sent at 02/15/2023  9:40 AM EDT ----- If patient calls back, please relay the below:  1) Good news! NO evidence on the scan of cancer return or spread in the chest, abdomen, and pelvis.  2) They see surgery changes related to her lung surgery.  3) There are incidental findings of coronary artery and aortic artherosclerosis (buildup of plaque, follow up with PCP on this), possible constipation (so she needs to take something).  Dr. Pricilla Holm said in regards to her generalized weakness, Dr Pricilla Holm is recommending further eval and testing with her PCP

## 2023-02-15 NOTE — Telephone Encounter (Signed)
Patient's CT scan results faxed to PCP Lorenda Ishihara, MD [facility].

## 2023-02-15 NOTE — Telephone Encounter (Signed)
Attempted to reach Ms. Bragan to relay message from providers. Left voicemail requesting call back.

## 2023-02-22 DIAGNOSIS — C44329 Squamous cell carcinoma of skin of other parts of face: Secondary | ICD-10-CM | POA: Diagnosis not present

## 2023-02-22 DIAGNOSIS — D485 Neoplasm of uncertain behavior of skin: Secondary | ICD-10-CM | POA: Diagnosis not present

## 2023-02-22 DIAGNOSIS — L570 Actinic keratosis: Secondary | ICD-10-CM | POA: Diagnosis not present

## 2023-02-22 DIAGNOSIS — C4441 Basal cell carcinoma of skin of scalp and neck: Secondary | ICD-10-CM | POA: Diagnosis not present

## 2023-02-22 DIAGNOSIS — X32XXXA Exposure to sunlight, initial encounter: Secondary | ICD-10-CM | POA: Diagnosis not present

## 2023-03-07 DIAGNOSIS — C4441 Basal cell carcinoma of skin of scalp and neck: Secondary | ICD-10-CM | POA: Diagnosis not present

## 2023-04-11 ENCOUNTER — Encounter: Payer: Self-pay | Admitting: Gynecologic Oncology

## 2023-04-11 ENCOUNTER — Inpatient Hospital Stay: Payer: PPO | Attending: Gynecologic Oncology | Admitting: Gynecologic Oncology

## 2023-04-11 ENCOUNTER — Telehealth: Payer: Self-pay | Admitting: Oncology

## 2023-04-11 VITALS — BP 138/66 | HR 80 | Temp 98.1°F | Resp 20 | Wt 131.0 lb

## 2023-04-11 DIAGNOSIS — N763 Subacute and chronic vulvitis: Secondary | ICD-10-CM | POA: Diagnosis not present

## 2023-04-11 DIAGNOSIS — Z902 Acquired absence of lung [part of]: Secondary | ICD-10-CM | POA: Diagnosis not present

## 2023-04-11 DIAGNOSIS — Z90722 Acquired absence of ovaries, bilateral: Secondary | ICD-10-CM | POA: Insufficient documentation

## 2023-04-11 DIAGNOSIS — Z90710 Acquired absence of both cervix and uterus: Secondary | ICD-10-CM | POA: Insufficient documentation

## 2023-04-11 DIAGNOSIS — N762 Acute vulvitis: Secondary | ICD-10-CM | POA: Diagnosis not present

## 2023-04-11 DIAGNOSIS — N9089 Other specified noninflammatory disorders of vulva and perineum: Secondary | ICD-10-CM | POA: Insufficient documentation

## 2023-04-11 DIAGNOSIS — Z8542 Personal history of malignant neoplasm of other parts of uterus: Secondary | ICD-10-CM | POA: Insufficient documentation

## 2023-04-11 DIAGNOSIS — C541 Malignant neoplasm of endometrium: Secondary | ICD-10-CM

## 2023-04-11 MED ORDER — FLUCONAZOLE 150 MG PO TABS
150.0000 mg | ORAL_TABLET | Freq: Every day | ORAL | 0 refills | Status: AC
Start: 2023-04-11 — End: 2023-04-13

## 2023-04-11 NOTE — Progress Notes (Signed)
Gynecologic Oncology Return Clinic Visit  04/11/23  Reason for Visit: surveillance visit in the setting of recurrent endometrial cancer    Treatment History: [name] is a 82 year old woman with a history of stage IA grade 1 endometrial cancer in 2018 who was seen in consultation at the request of Dr Connye Burkitt for evaluation of a vaginal mass.   The patient underwent surgical staging for her endometrial cancer in May 2018.  This included a robotic total hysterectomy with BSO and sentinel lymph node biopsy.  Her surgery was complicated by extensive adhesions from her prior Whipple procedure.   Final pathology revealed a grade 1 endometrioid endometrial adenocarcinoma that measured 4 cm.  There was inner half myometrial invasion and negative sentinel lymph nodes with no lymphovascular space invasion.  She was characterizes having low risk endometrial cancer and therefore no adjuvant therapy was recommended in accordance with NCCN guidelines. MMR testing was not performed at that time, but later requested revealing loss of expression of MLH1 and PMS2 (MSI high), and ER /PR strongly positive.   She received subsequent follow-up with her benign gynecologist and was lost to follow-up with [place].   In early December 2021 she began experiencing postmenopausal bleeding.  She reported this to her primary care doctor who attempted speculum exam which was unsuccessful due to discomfort.  The patient was then seen by Dr. Connye Burkitt who performed a limited speculum exam due to patient discomfort and recognized a vaginal mass.  She subsequently referred the patient to see GYN oncology.   The patient was then seen by me on 05/13/20 and on vaginal examination the posterior left vaginal wall/apex was replaced by tumor. Biopsy was performed which confirmed recurrent endometrioid endometrial cancer.    She received restaging with PET scan on 06/18/20 which revealed hypermetabolism noted in the vaginal cuff and upper  1/3 of the vagina consistent with known neoplasm. No locoregional lymphadenopathy or distant metastatic disease was seen. This suggested a localized pelvic recurrence and treatment with salvage radiation was recommended.   She received salvage radiation between 06/21/2020 through 08/25/2020.  This included external beam radiation with IMRT to the vagina and pelvis, total dose 45 Gy.  Additionally received she received vaginal and pelvic boost with HDR brachytherapy total dose of 24 Gy.   After being seeing for post-treatment surveillance in June, 2022, she was scheduled to have a post-treatment surveillance/evaluation PET scan on 11/11/20. This revealed complete resolution at the vaginal/pelvic recurrence site, however there was a new 12 mm posterior left upper lobe pulmonary nodule/metastasis seen.   The patient then immediately travelled to [place] to visit family for 3 months. Her case was presented at the thoracic multidisciplinary conference and the recommendation was made for referral/consultation with Dr Dorris Fetch from Thoracic Surgery. She elected to stay on on [place] until September, when she would return for repeat imaging and consultation with Thoracic Surgery.    On 01/28/21 she received a CT chest to monitor the pulmonary lesion and this showed an interval enlargement of the aggressive appearing left upper lobe pulmonary nodule which measured 1.7 x 1.2 x 1.2 cm. It was felt to likely represent a solitary metastatic lesion, however, the possibility of a primary bronchogenic neoplasm also warranted consideration, particularly in light of the morphology of the nodule. No new pulmonary nodules and no lymphadenopathy were noted in the thorax.   She was seen by Dr Dorris Fetch who recommended a VATS procedure.     On 03/09/21, she underwent Robotic  LUL segmentectomy with lymph node dissection with tear of the lingular arterial branch require thoracotomy to control bleeding. She was ultimately  discharged to home with home PT/OT and home health on 11/2. Final pathology revealed metastatic adenocarcinoma c/w known endometrial adenocarcinoma, negative lymph nodes. ER+.  Margins widely negative (1.6 cm and greater).   CT C/A/P on 08/03/21 revealed postoperative findings including small left pleural effusion with enhancing pleural surfaces.  No abdominal or pelvic findings concerning for metastatic disease.  CT C/A/P 03/2022 no acute changes.  No evidence of recurrent or metastatic disease.  Thickened appearance of the wall of the cecum may be related to underdistention.  Further evaluation with colonoscopy or CT colonography is recommended.  Stable postoperative changes of the left lung with chronic small left pleural effusion.  Interval History: Overall doing well.  Denies any vaginal bleeding or discharge.  Reports intermittent pain, at baseline.  Endorses normal bowel function although will sometimes have difficulty controlling stooling if she skips a day without a bowel movement.  Continues to struggle with urinary incontinence.  Vulvar symptoms are at baseline or slightly worse.  She has tried using Vaseline, lidocaine, A&E ointment, clobetasol, and doing nothing.  She occasionally will have relief for several hours.  Otherwise continues to struggle with significant vulvar pruritus.  Sometimes has irritation when urine hits her vulva.  Wears depends secondary to incontinence.  Past Medical/Surgical History: Past Medical History:  Diagnosis Date   Anemia    Atypical mole 05/15/2005   Left Mid Outer Back (slight to moderate)   BCC (basal cell carcinoma of skin) 11/17/1991   Right Cheek (excision)   BCC (basal cell carcinoma of skin) 05/30/1994   Left Upper Forehead (curet and 5FU)   BCC (basal cell carcinoma of skin) 05/04/1997   Right upper Forehead/hairline (curet and excision)   BCC (basal cell carcinoma of skin) 06/25/1997   Right Upper Forehead/hairline (+margin) (MOH's)   BCC  (basal cell carcinoma of skin) 11/12/2006   Left Cheek (curet and excision)   BCC (basal cell carcinoma of skin) 08/15/2009   Right Lower Leg (curet 5FU)   BCE (basal cell epithelioma), leg, right    Cataract 2021   Colon polyps    Coronary artery disease 2008   Coronary atherosclerosis    Diabetes mellitus without complication (HCC)    pre-diabetic   DVT of lower extremity (deep venous thrombosis) (HCC) 07/2010   endometrioid endometrial    GI bleed 2009   GIST (gastrointestinal stromal tumor), malignant (HCC)    T2N0   H pylori ulcer 2001   History of radiation therapy 06/21/20-07/25/20   endometrium, IMRT     Dr. Roselind Messier   History of radiation therapy 08/17/2020, 08/23/2020, 08/25/2020   vaginal brachytherapy     Dr Antony Blackbird   Hypercholesteremia    Hyperlipidemia    Hypertension    per patient-on medication for preventative/has not diagnosed with HTN    Insomnia    Iron deficiency    Joint pain    Lung cancer (HCC)    Osteopenia    Reflux    SCCA (squamous cell carcinoma) of skin 05/15/2005   Left Chest (in situ)   SCCA (squamous cell carcinoma) of skin 10/24/2011   Left Side of Chest Med (in situ) (tx p bx)   SCCA (squamous cell carcinoma) of skin 01/28/2019   Right Forearm inf (Keratoacanthoma) (tx p bx)   Superficial basal cell carcinoma (BCC) 06/25/1995   Upper Forehead at scar (curet and  5FU)   Ulcer    epigastric     Past Surgical History:  Procedure Laterality Date   ABDOMINAL HYSTERECTOMY     AUGMENTATION MAMMAPLASTY     BREAST ENHANCEMENT SURGERY     since removed in 2016   BREAST SURGERY     CARDIAC CATHETERIZATION     pt claims she has never had a cardiac catheterization   CATARACT EXTRACTION Bilateral 2021   CHOLECYSTECTOMY     INTERCOSTAL NERVE BLOCK Left 03/09/2021   Procedure: INTERCOSTAL NERVE BLOCK;  Surgeon: Loreli Slot, MD;  Location: [facility] OR;  Service: Thoracic;  Laterality: Left;   LEFT HEART CATH AND CORONARY ANGIOGRAPHY N/A  09/06/2021   Procedure: LEFT HEART CATH AND CORONARY ANGIOGRAPHY;  Surgeon: Tonny Bollman, MD;  Location: [facility] INVASIVE CV LAB;  Service: Cardiovascular;  Laterality: N/A;   LYMPH NODE DISSECTION Left 03/09/2021   Procedure: LYMPH NODE DISSECTION;  Surgeon: Loreli Slot, MD;  Location: [facility] OR;  Service: Thoracic;  Laterality: Left;   MOHS SURGERY Left 1994   At [facility]   PANCREATICODUODENECTOMY  06/27/2010   whipple    ROBOTIC ASSISTED TOTAL HYSTERECTOMY WITH BILATERAL SALPINGO OOPHERECTOMY Bilateral 10/02/2016   Procedure: XI ROBOTIC ASSISTED TOTAL HYSTERECTOMY WITH BILATERAL SALPINGO OOPHORECTOMY WITH SENTINAL LYMPH NODE BIOPSY LYSIS OF ADHESIONS, EXPLORATORY LAPAROTOMY;  Surgeon: Adolphus Birchwood, MD;  Location: [facility] ORS;  Service: Gynecology;  Laterality: Bilateral;   THORACOTOMY Left 03/09/2021   Procedure: THORACOTOMY MAJOR;  Surgeon: Loreli Slot, MD;  Location: [facility] OR;  Service: Thoracic;  Laterality: Left;   TUBAL LIGATION     WHIPPLE PROCEDURE  2012    Family History  Problem Relation Age of Onset   Diabetes Mother    Dementia Mother    Hypertension Mother    Heart attack Father    Heart attack Brother    Cancer Brother        melanoma   Colon cancer Neg Hx    Breast cancer Neg Hx    Ovarian cancer Neg Hx    Endometrial cancer Neg Hx    Pancreatic cancer Neg Hx    Prostate cancer Neg Hx     Social History   Socioeconomic History   Marital status: Widowed    Spouse name: Not on file   Number of children: 1   Years of education: Not on file   Highest education level: Not on file  Occupational History   Occupation: accounting    Comment: retired  Tobacco Use   Smoking status: Never   Smokeless tobacco: Never  Vaping Use   Vaping status: Never Used  Substance and Sexual Activity   Alcohol use: No   Drug use: No   Sexual activity: Not Currently    Birth control/protection: Surgical  Other Topics Concern   Not on file  Social History Narrative   **  Merged History Encounter **       Social Determinants of Health   Financial Resource Strain: Not on file  Food Insecurity: Not on file  Transportation Needs: Not on file  Physical Activity: Not on file  Stress: Not on file  Social Connections: Unknown (10/06/2021)   Received from [facility], [facility]   Social Network    Social Network: Not on file    Current Medications:  Current Outpatient Medications:    fluconazole (DIFLUCAN) 150 MG tablet, Take 1 tablet (150 mg total) by mouth daily for 2 doses. Take 1 tablet.  Take second tablet 72  hours later., Disp: 2 tablet, Rfl: 0   Apoaequorin (PREVAGEN) 10 MG CAPS, Take 10 mg by mouth daily. (Patient not taking: Reported on 04/09/2023), Disp: , Rfl:    Ascorbic Acid (VITAMIN C) 500 MG CAPS, Take 500 mg by mouth daily., Disp: , Rfl:    clobetasol ointment (TEMOVATE) 0.05 %, Apply 1 Application topically 2 (two) times a week. Use this on vulva no more than 2 times a week at night. If symptoms get worse, stop using (Patient not taking: Reported on 04/09/2023), Disp: 30 g, Rfl: 0   co-enzyme Q-10 30 MG capsule, Take 30 mg by mouth daily., Disp: , Rfl:    CREON 12000-38000 units CPEP capsule, Take 12,000 Units by mouth 3 (three) times daily before meals., Disp: , Rfl:    Cyanocobalamin (B-12 PO), Take 1 capsule by mouth daily., Disp: , Rfl:    dapagliflozin propanediol (FARXIGA) 10 MG TABS tablet, Take by mouth daily., Disp: , Rfl:    famotidine (PEPCID) 20 MG tablet, Take 20 mg by mouth daily as needed., Disp: , Rfl:    magnesium gluconate (MAGONATE) 500 MG tablet, Take 500 mg by mouth 2 (two) times daily., Disp: , Rfl:    Melatonin 10 MG TABS, Take 10 mg by mouth at bedtime as needed (sleep)., Disp: , Rfl:    Multiple Vitamins-Minerals (ONE-A-DAY WOMENS 50+ ADVANTAGE PO), Take by mouth., Disp: , Rfl:    ONETOUCH VERIO test strip, daily., Disp: , Rfl:    VITAMIN D PO, Take 1 capsule by mouth daily., Disp: , Rfl:    vitamin E 180 MG (400  UNITS) capsule, Take 400 Units by mouth at bedtime., Disp: , Rfl:    zinc gluconate 50 MG tablet, Take 50 mg by mouth 2 (two) times daily., Disp: , Rfl:   Current Facility-Administered Medications:    sodium chloride flush (NS) 0.9 % injection 3 mL, 3 mL, Intravenous, Q12H, Croitoru, Mihai, MD  Review of Systems: + itching Denies appetite changes, fevers, chills, fatigue, unexplained weight changes. Denies hearing loss, neck lumps or masses, mouth sores, ringing in ears or voice changes. Denies cough or wheezing.  Denies shortness of breath. Denies chest pain or palpitations. Denies leg swelling. Denies abdominal distention, pain, blood in stools, constipation, diarrhea, nausea, vomiting, or early satiety. Denies pain with intercourse, dysuria, frequency, hematuria or incontinence. Denies hot flashes, pelvic pain, vaginal bleeding or vaginal discharge.   Denies joint pain, back pain or muscle pain/cramps. Denies rash, or wounds. Denies dizziness, headaches, numbness or seizures. Denies swollen lymph nodes or glands, denies easy bruising or bleeding. Denies anxiety, depression, confusion, or decreased concentration.  Physical Exam: BP 138/66 (BP Location: Left Arm, Patient Position: Sitting)   Pulse 80   Temp 98.1 F (36.7 C) (Oral)   Resp 20   Wt 131 lb (59.4 kg)   SpO2 95%   BMI 22.37 kg/m  General: Alert, oriented, no acute distress. HEENT: Normocephalic, atraumatic, sclera anicteric. Chest: Clear to auscultation bilaterally.  No wheezes or rhonchi. Cardiovascular: Regular rate and rhythm, no murmurs. Abdomen: soft, nontender.  Normoactive bowel sounds.  No masses or hepatosplenomegaly appreciated.  Well-healed incisions. Extremities: Grossly normal range of motion.  Warm, well perfused.  No edema bilaterally. Skin: No rashes or lesions noted. Lymphatics: No cervical, supraclavicular, or inguinal adenopathy. GU: Erythema of the vulva, especially posterior vulva with loss of  architecture of the labia posteriorly.  Posterior vulva especially has areas that appeared to be excoriations.  Speculum exam reveals agglutination of  the upper vagina, can only place the speculum approximately 2-3 cm within the vagina.  Vaginal mucosa is atrophic with radiation changes noted.  No masses seen.  On reactal exam, no masses or nodularity.  Vulvar biopsy procedure Preoperative diagnosis: Vulvar irritation/erythema Postoperative diagnosis: Same as above Physician: Pricilla Holm MD Estimated blood loss: Minimal Specimens: Vulvar biopsy Procedure: After the procedure was discussed with the patient including risks and benefits, she gave verbal consent.  She was already in dorsolithotomy position.  Along the left vulva was identified to be biopsied.  This was cleansed with Betadine x 3.  1 cc of 2% lidocaine was then injected for local anesthesia.  A 3 mm punch biopsy was then taken.  This was placed in formalin.  Pressure was held but given some ongoing oozing, interrupted 4-0 Vicryl was used to close the incision and achieve hemostasis.  Overall the patient tolerated the procedure well.    Laboratory & Radiologic Studies: CT C/A/P 02/09/23: 1. No evidence of metastatic disease in the chest, abdomen, or pelvis. 2. Left upper and left lower lobe surgical changes with similar left apical bronchopleural fistula with resultant loculated trace pleural air. 3. Similar trace left pleuralthickening. 4. Incidental findings, including: Coronary artery atherosclerosis. Aortic Atherosclerosis (ICD10-I70.0). Possible constipation.  Assessment & Plan: [name] is a 83 y.o. woman with a history of recurrent endometrioid endometrial cancer at the vaginal cuff and posterior vaginal wall (MMR deficient, ER/PR positive) diagnosed in December, 2021 after initial staging surgery/diagnosis in May, 2018. S/p salvage RT to pelvis and vagina. Most recently had oligometastatic disease within the left upper  lung status post resection in October 2022 complicated by bleeding.  Pathology confirms recurrent disease, margins widely negative.   Last imaging 01/2023 was negative for recurrent or metastatic disease.  Patient is overall doing well from a cancer standpoint and is NED on exam.  Her recent scan in November was negative for any evidence of metastatic disease.  She continues to struggle with vulvar symptoms.  She has not responded to typical treatment for lichen sclerosus.  Biopsy performed today.  Prescription for Diflucan sent to her pharmacy.  If biopsy does not show Will likely referral to [facility] vulvar clinic.  22 minutes of total time was spent for this patient encounter, including preparation, face-to-face counseling with the patient and coordination of care, and documentation of the encounter.  Eugene Garnet, MD  Division of Gynecologic Oncology  Department of Obstetrics and Gynecology  [facility]

## 2023-04-11 NOTE — Patient Instructions (Signed)
It was good to see you today.  I do not see or feel any evidence of cancer recurrence on your exam.  I will see you for follow-up in 6 months.  I will call you next week with your biopsy results.  Please take one of the Diflucan tablets when you pick up the prescription and take the second tablet 72 hours later.  I will let you know when I get your biopsy results back next week.  As always, if you develop any new and concerning symptoms before your next visit, please call to see me sooner.

## 2023-04-11 NOTE — Telephone Encounter (Signed)
Called [name] and rescheduled her appointment with Dr. Pricilla Holm to today at 3:45.

## 2023-04-12 ENCOUNTER — Inpatient Hospital Stay: Payer: PPO | Admitting: Gynecologic Oncology

## 2023-04-12 DIAGNOSIS — C541 Malignant neoplasm of endometrium: Secondary | ICD-10-CM

## 2023-04-16 ENCOUNTER — Telehealth: Payer: Self-pay | Admitting: Oncology

## 2023-04-16 LAB — SURGICAL PATHOLOGY

## 2023-04-16 NOTE — Telephone Encounter (Signed)
Left a message regarding referral to [facility] - Dr. Marisa Sprinkles and Dr. Kizzie Bane.  Requested a return call.

## 2023-04-16 NOTE — Progress Notes (Signed)
HI Clydie Braun - could we get a referral to the vulvar pain and chronic vaginitis clinic with Drs. Phipps and [place] at [facility]? I think its at their [facility] campus and happens once a week. Would you mind investigating and let the patient know what you find out? Thank you, Georgiann Hahn

## 2023-04-17 NOTE — Telephone Encounter (Signed)
Faxed referral to the [facility] vulvar pain and chronic vaginitis clinic.  Also tried to call in the referral and left a voicemail.

## 2023-04-19 ENCOUNTER — Telehealth: Payer: Self-pay | Admitting: Oncology

## 2023-04-19 ENCOUNTER — Encounter (HOSPITAL_COMMUNITY): Payer: Self-pay

## 2023-04-19 NOTE — Telephone Encounter (Signed)
Called [facility] regarding Miia's referral to the vulvar pain and chronic vaginitis clinic and spoke to Califon.  She said they have received the referral and will call Bibianna and get her scheduled as soon as the April schedule opens up in the next 2 weeks. They also advised her to call the clinic for cancellations anytime.  Called Venetia and let her know.  She is good with an April appointment and knows to expect a call.  Also gave her the clinic phone number and let her know to call to check for cancellations if she would like.

## 2023-04-23 NOTE — Progress Notes (Signed)
Going to fix the issue

## 2023-05-27 ENCOUNTER — Telehealth: Payer: Self-pay | Admitting: Oncology

## 2023-05-27 NOTE — Telephone Encounter (Signed)
Called Khamani regarding scheduling an appointment at the [facility] vulvar symptom clinic.  She said she is feeling much better and is finally starting to heal.  She is going to call the [facility] clinic back after the 1st of the year if she doesn't continue to improve.

## 2023-05-27 NOTE — Telephone Encounter (Signed)
Thanks Karen

## 2023-08-27 DIAGNOSIS — C4441 Basal cell carcinoma of skin of scalp and neck: Secondary | ICD-10-CM | POA: Diagnosis not present

## 2023-10-04 ENCOUNTER — Telehealth: Payer: Self-pay | Admitting: *Deleted

## 2023-10-04 NOTE — Telephone Encounter (Signed)
 Spoke with patient who called the office to reschedule her appt. With Dr. Orvil Bland on May 16 th because she will be in [place]  until July. Pt was given a new appt. For Friday, September 5 th at 2:45. Pt agreed to date and time and had no further concerns.

## 2023-10-11 ENCOUNTER — Ambulatory Visit: Payer: PPO | Admitting: Gynecologic Oncology

## 2023-10-26 DIAGNOSIS — C3492 Malignant neoplasm of unspecified part of left bronchus or lung: Secondary | ICD-10-CM | POA: Diagnosis not present

## 2023-10-26 DIAGNOSIS — E785 Hyperlipidemia, unspecified: Secondary | ICD-10-CM | POA: Diagnosis not present

## 2023-10-26 DIAGNOSIS — E1165 Type 2 diabetes mellitus with hyperglycemia: Secondary | ICD-10-CM | POA: Diagnosis not present

## 2023-10-26 DIAGNOSIS — I2510 Atherosclerotic heart disease of native coronary artery without angina pectoris: Secondary | ICD-10-CM | POA: Diagnosis not present

## 2023-11-25 DIAGNOSIS — E785 Hyperlipidemia, unspecified: Secondary | ICD-10-CM | POA: Diagnosis not present

## 2023-11-25 DIAGNOSIS — C3492 Malignant neoplasm of unspecified part of left bronchus or lung: Secondary | ICD-10-CM | POA: Diagnosis not present

## 2023-11-25 DIAGNOSIS — I2510 Atherosclerotic heart disease of native coronary artery without angina pectoris: Secondary | ICD-10-CM | POA: Diagnosis not present

## 2023-11-25 DIAGNOSIS — E1165 Type 2 diabetes mellitus with hyperglycemia: Secondary | ICD-10-CM | POA: Diagnosis not present

## 2023-12-26 DIAGNOSIS — C3492 Malignant neoplasm of unspecified part of left bronchus or lung: Secondary | ICD-10-CM | POA: Diagnosis not present

## 2023-12-26 DIAGNOSIS — E785 Hyperlipidemia, unspecified: Secondary | ICD-10-CM | POA: Diagnosis not present

## 2023-12-26 DIAGNOSIS — E1165 Type 2 diabetes mellitus with hyperglycemia: Secondary | ICD-10-CM | POA: Diagnosis not present

## 2023-12-26 DIAGNOSIS — I2510 Atherosclerotic heart disease of native coronary artery without angina pectoris: Secondary | ICD-10-CM | POA: Diagnosis not present

## 2024-01-26 DIAGNOSIS — E1165 Type 2 diabetes mellitus with hyperglycemia: Secondary | ICD-10-CM | POA: Diagnosis not present

## 2024-01-26 DIAGNOSIS — E785 Hyperlipidemia, unspecified: Secondary | ICD-10-CM | POA: Diagnosis not present

## 2024-01-26 DIAGNOSIS — I2510 Atherosclerotic heart disease of native coronary artery without angina pectoris: Secondary | ICD-10-CM | POA: Diagnosis not present

## 2024-01-26 DIAGNOSIS — C3492 Malignant neoplasm of unspecified part of left bronchus or lung: Secondary | ICD-10-CM | POA: Diagnosis not present

## 2024-01-29 DIAGNOSIS — C3492 Malignant neoplasm of unspecified part of left bronchus or lung: Secondary | ICD-10-CM | POA: Diagnosis not present

## 2024-01-29 DIAGNOSIS — E1169 Type 2 diabetes mellitus with other specified complication: Secondary | ICD-10-CM | POA: Diagnosis not present

## 2024-01-29 DIAGNOSIS — I2510 Atherosclerotic heart disease of native coronary artery without angina pectoris: Secondary | ICD-10-CM | POA: Diagnosis not present

## 2024-01-29 DIAGNOSIS — C49A9 Gastrointestinal stromal tumor of other sites: Secondary | ICD-10-CM | POA: Diagnosis not present

## 2024-01-29 DIAGNOSIS — Z1331 Encounter for screening for depression: Secondary | ICD-10-CM | POA: Diagnosis not present

## 2024-01-29 DIAGNOSIS — R350 Frequency of micturition: Secondary | ICD-10-CM | POA: Diagnosis not present

## 2024-01-29 DIAGNOSIS — M810 Age-related osteoporosis without current pathological fracture: Secondary | ICD-10-CM | POA: Diagnosis not present

## 2024-01-29 DIAGNOSIS — Z0000 Encounter for general adult medical examination without abnormal findings: Secondary | ICD-10-CM | POA: Diagnosis not present

## 2024-01-29 DIAGNOSIS — Z23 Encounter for immunization: Secondary | ICD-10-CM | POA: Diagnosis not present

## 2024-01-29 DIAGNOSIS — Z86718 Personal history of other venous thrombosis and embolism: Secondary | ICD-10-CM | POA: Diagnosis not present

## 2024-01-31 ENCOUNTER — Encounter: Payer: Self-pay | Admitting: Gynecologic Oncology

## 2024-01-31 ENCOUNTER — Inpatient Hospital Stay

## 2024-01-31 ENCOUNTER — Inpatient Hospital Stay: Attending: Gynecologic Oncology | Admitting: Gynecologic Oncology

## 2024-01-31 VITALS — BP 138/54 | HR 68 | Temp 98.4°F | Resp 19 | Wt 133.2 lb

## 2024-01-31 DIAGNOSIS — Z9079 Acquired absence of other genital organ(s): Secondary | ICD-10-CM | POA: Insufficient documentation

## 2024-01-31 DIAGNOSIS — Z923 Personal history of irradiation: Secondary | ICD-10-CM | POA: Insufficient documentation

## 2024-01-31 DIAGNOSIS — Z90710 Acquired absence of both cervix and uterus: Secondary | ICD-10-CM | POA: Insufficient documentation

## 2024-01-31 DIAGNOSIS — R32 Unspecified urinary incontinence: Secondary | ICD-10-CM

## 2024-01-31 DIAGNOSIS — Z90722 Acquired absence of ovaries, bilateral: Secondary | ICD-10-CM | POA: Diagnosis not present

## 2024-01-31 DIAGNOSIS — C541 Malignant neoplasm of endometrium: Secondary | ICD-10-CM

## 2024-01-31 DIAGNOSIS — Z8542 Personal history of malignant neoplasm of other parts of uterus: Secondary | ICD-10-CM | POA: Insufficient documentation

## 2024-01-31 LAB — BASIC METABOLIC PANEL WITH GFR
Anion gap: 6 (ref 5–15)
BUN: 17 mg/dL (ref 8–23)
CO2: 29 mmol/L (ref 22–32)
Calcium: 9.1 mg/dL (ref 8.9–10.3)
Chloride: 104 mmol/L (ref 98–111)
Creatinine, Ser: 0.69 mg/dL (ref 0.44–1.00)
GFR, Estimated: >60 mL/min (ref >60)
Glucose, Bld: 258 mg/dL — ABNORMAL HIGH (ref 70–99)
Potassium: 5 mmol/L (ref 3.5–5.1)
Sodium: 139 mmol/L (ref 135–145)

## 2024-01-31 NOTE — Patient Instructions (Signed)
 It was good to see you today.  I do not see or feel any evidence of cancer recurrence on your exam.  Especially in the setting of your recent change in bladder symptoms, we are getting a CT scan now.  I will contact you with these results.  I am going to place a referral to get you scheduled to see one of our urogynecologist.  If there is some other explanation for the change in your bladder symptoms on CT scan, we can always cancel this referral.  As always, if you develop any new and concerning symptoms before your next visit, please call to see me sooner.

## 2024-01-31 NOTE — Progress Notes (Signed)
 Gynecologic Oncology Return Clinic Visit  01/31/24  Reason for Visit: surveillance visit in the setting of recurrent endometrial cancer    Treatment History: [name] is a 83 year old woman with a history of stage IA grade 1 endometrial cancer in 2018 who was seen in consultation at the request of Dr Storm for evaluation of a vaginal mass.   The patient underwent surgical staging for her endometrial cancer in May 2018.  This included a robotic total hysterectomy with BSO and sentinel lymph node biopsy.  Her surgery was complicated by extensive adhesions from her prior Whipple procedure.   Final pathology revealed a grade 1 endometrioid endometrial adenocarcinoma that measured 4 cm.  There was inner half myometrial invasion and negative sentinel lymph nodes with no lymphovascular space invasion.  She was characterizes having low risk endometrial cancer and therefore no adjuvant therapy was recommended in accordance with NCCN guidelines. MMR testing was not performed at that time, but later requested revealing loss of expression of MLH1 and PMS2 (MSI high), and ER /PR strongly positive.   She received subsequent follow-up with her benign gynecologist and was lost to follow-up with us .   In early December 2021 she began experiencing postmenopausal bleeding.  She reported this to her primary care doctor who attempted speculum exam which was unsuccessful due to discomfort.  The patient was then seen by Dr. Storm who performed a limited speculum exam due to patient discomfort and recognized a vaginal mass.  She subsequently referred the patient to see GYN oncology.   The patient was then seen by me on 05/13/20 and on vaginal examination the posterior left vaginal wall/apex was replaced by tumor. Biopsy was performed which confirmed recurrent endometrioid endometrial cancer.    She received restaging with PET scan on 06/18/20 which revealed hypermetabolism noted in the vaginal cuff and upper  1/3 of the vagina consistent with known neoplasm. No locoregional lymphadenopathy or distant metastatic disease was seen. This suggested a localized pelvic recurrence and treatment with salvage radiation was recommended.   She received salvage radiation between 06/21/2020 through 08/25/2020.  This included external beam radiation with IMRT to the vagina and pelvis, total dose 45 Gy.  Additionally received she received vaginal and pelvic boost with HDR brachytherapy total dose of 24 Gy.   After being seeing for post-treatment surveillance in June, 2022, she was scheduled to have a post-treatment surveillance/evaluation PET scan on 11/11/20. This revealed complete resolution at the vaginal/pelvic recurrence site, however there was a new 12 mm posterior left upper lobe pulmonary nodule/metastasis seen.   The patient then immediately travelled to [place]  to visit family for 3 months. Her case was presented at the thoracic multidisciplinary conference and the recommendation was made for referral/consultation with Dr Kerrin from Thoracic Surgery. She elected to stay on on [place]  until September, when she would return for repeat imaging and consultation with Thoracic Surgery.    On 01/28/21 she received a CT chest to monitor the pulmonary lesion and this showed an interval enlargement of the aggressive appearing left upper lobe pulmonary nodule which measured 1.7 x 1.2 x 1.2 cm. It was felt to likely represent a solitary metastatic lesion, however, the possibility of a primary bronchogenic neoplasm also warranted consideration, particularly in light of the morphology of the nodule. No new pulmonary nodules and no lymphadenopathy were noted in the thorax.   She was seen by Dr Kerrin who recommended a VATS procedure.     On 03/09/21, she underwent Robotic  LUL segmentectomy with lymph node dissection with tear of the lingular arterial branch require thoracotomy to control bleeding. She was ultimately  discharged to home with home PT/OT and home health on 11/2. Final pathology revealed metastatic adenocarcinoma c/w known endometrial adenocarcinoma, negative lymph nodes. ER+.  Margins widely negative (1.6 cm and greater).   CT C/A/P on 08/03/21 revealed postoperative findings including small left pleural effusion with enhancing pleural surfaces.  No abdominal or pelvic findings concerning for metastatic disease.   CT C/A/P 03/2022 no acute changes.  No evidence of recurrent or metastatic disease.  Thickened appearance of the wall of the cecum may be related to underdistention.  Further evaluation with colonoscopy or CT colonography is recommended.  Stable postoperative changes of the left lung with chronic small left pleural effusion.  Interval History: Notes change in urinary symptoms over the last month including increased incontinence, urinary frequency, some pressure, and burning.  Has been treating this herself with Azo over-the-counter without much relief.  She notes significant increase in urinary leakage.  Had an appointment with her primary care provider earlier this week at which time urine testing was done.  The patient has not heard the results of this.  She denies any vaginal bleeding.  Reports daily bowel movements although will have a couple of days where she has less and then a day where she has significant amount of stool with solid and then softer stool.  Has had some right sided lower back pain over the last several weeks which she thought was related to doing some work around the house.  Has not been taking any pain medication over-the-counter or using heat.  Breathing is stable, shortness of breath intermittently.  Past Medical/Surgical History: Past Medical History:  Diagnosis Date   Anemia    Atypical mole 05/15/2005   Left Mid Outer Back (slight to moderate)   BCC (basal cell carcinoma of skin) 11/17/1991   Right Cheek (excision)   BCC (basal cell carcinoma of skin)  05/30/1994   Left Upper Forehead (curet and 5FU)   BCC (basal cell carcinoma of skin) 05/04/1997   Right upper Forehead/hairline (curet and excision)   BCC (basal cell carcinoma of skin) 06/25/1997   Right Upper Forehead/hairline (+margin) (MOH's)   BCC (basal cell carcinoma of skin) 11/12/2006   Left Cheek (curet and excision)   BCC (basal cell carcinoma of skin) 08/15/2009   Right Lower Leg (curet 5FU)   BCE (basal cell epithelioma), leg, right    Cataract 2021   Colon polyps    Coronary artery disease 2008   Coronary atherosclerosis    Diabetes mellitus without complication (HCC)    pre-diabetic   DVT of lower extremity (deep venous thrombosis) (HCC) 07/2010   endometrioid endometrial    GI bleed 2009   GIST (gastrointestinal stromal tumor), malignant (HCC)    T2N0   H pylori ulcer 2001   History of radiation therapy 06/21/20-07/25/20   endometrium, IMRT     Dr. Shannon   History of radiation therapy 08/17/2020, 08/23/2020, 08/25/2020   vaginal brachytherapy     Dr Lynwood Shannon   Hypercholesteremia    Hyperlipidemia    Hypertension    per patient-on medication for preventative/has not diagnosed with HTN    Insomnia    Iron deficiency    Joint pain    Lung cancer (HCC)    Osteopenia    Reflux    SCCA (squamous cell carcinoma) of skin 05/15/2005   Left Chest (in situ)  SCCA (squamous cell carcinoma) of skin 10/24/2011   Left Side of Chest Med (in situ) (tx p bx)   SCCA (squamous cell carcinoma) of skin 01/28/2019   Right Forearm inf (Keratoacanthoma) (tx p bx)   Superficial basal cell carcinoma (BCC) 06/25/1995   Upper Forehead at scar (curet and 5FU)   Ulcer    epigastric     Past Surgical History:  Procedure Laterality Date   ABDOMINAL HYSTERECTOMY     AUGMENTATION MAMMAPLASTY     BREAST ENHANCEMENT SURGERY     since removed in 2016   BREAST SURGERY     CARDIAC CATHETERIZATION     pt claims she has never had a cardiac catheterization   CATARACT EXTRACTION  Bilateral 2021   CHOLECYSTECTOMY     INTERCOSTAL NERVE BLOCK Left 03/09/2021   Procedure: INTERCOSTAL NERVE BLOCK;  Surgeon: Kerrin Elspeth BROCKS, MD;  Location: [facility] OR;  Service: Thoracic;  Laterality: Left;   LEFT HEART CATH AND CORONARY ANGIOGRAPHY N/A 09/06/2021   Procedure: LEFT HEART CATH AND CORONARY ANGIOGRAPHY;  Surgeon: Wonda Sharper, MD;  Location: [facility] INVASIVE CV LAB;  Service: Cardiovascular;  Laterality: N/A;   LYMPH NODE DISSECTION Left 03/09/2021   Procedure: LYMPH NODE DISSECTION;  Surgeon: Kerrin Elspeth BROCKS, MD;  Location: [facility] OR;  Service: Thoracic;  Laterality: Left;   MOHS SURGERY Left 1994   At [facility]   PANCREATICODUODENECTOMY  06/27/2010   whipple    ROBOTIC ASSISTED TOTAL HYSTERECTOMY WITH BILATERAL SALPINGO OOPHERECTOMY Bilateral 10/02/2016   Procedure: XI ROBOTIC ASSISTED TOTAL HYSTERECTOMY WITH BILATERAL SALPINGO OOPHORECTOMY WITH SENTINAL LYMPH NODE BIOPSY LYSIS OF ADHESIONS, EXPLORATORY LAPAROTOMY;  Surgeon: Eloy Herring, MD;  Location: [facility] ORS;  Service: Gynecology;  Laterality: Bilateral;   THORACOTOMY Left 03/09/2021   Procedure: THORACOTOMY MAJOR;  Surgeon: Kerrin Elspeth BROCKS, MD;  Location: [facility] OR;  Service: Thoracic;  Laterality: Left;   TUBAL LIGATION     WHIPPLE PROCEDURE  2012    Family History  Problem Relation Age of Onset   Diabetes Mother    Dementia Mother    Hypertension Mother    Heart attack Father    Heart attack Brother    Cancer Brother        melanoma   Colon cancer Neg Hx    Breast cancer Neg Hx    Ovarian cancer Neg Hx    Endometrial cancer Neg Hx    Pancreatic cancer Neg Hx    Prostate cancer Neg Hx     Social History   Socioeconomic History   Marital status: Widowed    Spouse name: Not on file   Number of children: 1   Years of education: Not on file   Highest education level: Not on file  Occupational History   Occupation: accounting    Comment: retired  Tobacco Use   Smoking status: Never   Smokeless tobacco:  Never  Vaping Use   Vaping status: Never Used  Substance and Sexual Activity   Alcohol use: No   Drug use: No   Sexual activity: Not Currently    Birth control/protection: Surgical  Other Topics Concern   Not on file  Social History Narrative   ** Merged History Encounter **       Social Drivers of Health   Financial Resource Strain: Not on file  Food Insecurity: Not on file  Transportation Needs: Not on file  Physical Activity: Not on file  Stress: Not on file  Social Connections: Unknown (10/06/2021)   Received from  [facility]   Social Network    Social Network: Not on file    Current Medications:  Current Outpatient Medications:    Ascorbic Acid (VITAMIN C) 500 MG CAPS, Take 500 mg by mouth daily., Disp: , Rfl:    co-enzyme Q-10 30 MG capsule, Take 30 mg by mouth daily., Disp: , Rfl:    CREON  12000-38000 units CPEP capsule, Take 12,000 Units by mouth 3 (three) times daily before meals., Disp: , Rfl:    Cyanocobalamin (B-12 PO), Take 1 capsule by mouth daily., Disp: , Rfl:    dapagliflozin propanediol (FARXIGA) 10 MG TABS tablet, Take by mouth daily., Disp: , Rfl:    famotidine (PEPCID) 20 MG tablet, Take 20 mg by mouth daily as needed., Disp: , Rfl:    magnesium  gluconate (MAGONATE) 500 MG tablet, Take 500 mg by mouth 2 (two) times daily., Disp: , Rfl:    Melatonin 10 MG TABS, Take 10 mg by mouth at bedtime as needed (sleep)., Disp: , Rfl:    ONETOUCH VERIO test strip, daily., Disp: , Rfl:    VITAMIN D PO, Take 1 capsule by mouth daily., Disp: , Rfl:    vitamin E 180 MG (400 UNITS) capsule, Take 400 Units by mouth at bedtime., Disp: , Rfl:    zinc gluconate 50 MG tablet, Take 50 mg by mouth 2 (two) times daily., Disp: , Rfl:    Apoaequorin (PREVAGEN) 10 MG CAPS, Take 10 mg by mouth daily. (Patient not taking: Reported on 04/09/2023), Disp: , Rfl:    clobetasol  ointment (TEMOVATE ) 0.05 %, Apply 1 Application topically 2 (two) times a week. Use this on vulva no more than  2 times a week at night. If symptoms get worse, stop using (Patient not taking: Reported on 04/09/2023), Disp: 30 g, Rfl: 0   Multiple Vitamins-Minerals (ONE-A-DAY WOMENS 50+ ADVANTAGE PO), Take by mouth. (Patient not taking: Reported on 01/29/2024), Disp: , Rfl:   Current Facility-Administered Medications:    sodium chloride  flush (NS) 0.9 % injection 3 mL, 3 mL, Intravenous, Q12H, Croitoru, Mihai, MD  Review of Systems: + Hearing loss, ringing in ears, dysuria, frequency, incontinence, vaginal discharge, back pain, easy bruising/bleeding Denies appetite changes, fevers, chills, fatigue, unexplained weight changes. Denies neck lumps or masses, mouth sores, or voice changes. Denies cough or wheezing.  Denies shortness of breath. Denies chest pain or palpitations. Denies leg swelling. Denies abdominal distention, pain, blood in stools, constipation, diarrhea, nausea, vomiting, or early satiety. Denies pain with intercourse, hematuria. Denies hot flashes, pelvic pain, vaginal bleeding.   Denies joint pain or muscle pain/cramps. Denies itching, rash, or wounds. Denies dizziness, headaches, numbness or seizures. Denies swollen lymph nodes or glands. Denies anxiety, depression, confusion, or decreased concentration.  Physical Exam: BP (!) 138/54 (BP Location: Right Arm, Patient Position: Sitting)   Pulse 68   Temp 98.4 F (36.9 C) (Oral)   Resp 19   Wt 133 lb 3.2 oz (60.4 kg)   SpO2 98%   BMI 22.74 kg/m  General: Alert, oriented, no acute distress. HEENT: Normocephalic, atraumatic, sclera anicteric. Chest: Clear to auscultation bilaterally.  No wheezes or rhonchi. Cardiovascular: Regular rate and rhythm, no murmurs. Abdomen: soft, nontender.  Normoactive bowel sounds.  No masses or hepatosplenomegaly appreciated.  Well-healed incisions. Extremities: Grossly normal range of motion.  Warm, well perfused.  No edema bilaterally. Skin: No rashes or lesions noted. Lymphatics: No cervical,  supraclavicular, or inguinal adenopathy. GU: Mild erythema of the vulva, significantly improved since last visit, with  loss of architecture of the labia posteriorly.  Speculum exam reveals agglutination of the upper vagina, can only place the speculum approximately 2-3 cm within the vagina.  Vaginal mucosa is atrophic with radiation changes noted.  No masses seen.  On reactal exam, no masses or nodularity.  Laboratory & Radiologic Studies: None new  Assessment & Plan: TASHAE INDA is a 83 y.o. woman with a history of recurrent endometrioid endometrial cancer at the vaginal cuff and posterior vaginal wall (MMR deficient, ER/PR positive) diagnosed in December, 2021 after initial staging surgery/diagnosis in May, 2018. S/p salvage RT to pelvis and vagina. Most recently had oligometastatic disease within the left upper lung status post resection in October 2022 complicated by bleeding.  Pathology confirms recurrent disease, margins widely negative.   Last imaging 01/2023 was negative for recurrent or metastatic disease.   Patient is overall doing well.  Discussed her recent change in urinary symptoms.  Urine testing was done at her PCPs office, results unknown per patient.  Although I do not feel anything on exam, we discussed the possibility that there could be something causing pressure on her bladder related to cancer history.  CT scan performed today for routine surveillance but also to workup any pelvic pathology.  Still has some vulvar symptoms but improved.  Had previously offered referral to [facility] vulvar clinic.  In the setting of her urinary incontinence, discussed referral to urogynecology.  If CT scan is negative for recurrent cancer or obvious cause of these increased symptoms, recommended that she see one of our urogynecologist.  Patient amenable.  Referral placed today to get her scheduled.  With regard to her bowel function, discussed adding some fiber or a biotic to see if this  helps her have more regular daily bowel movements.  22 minutes of total time was spent for this patient encounter, including preparation, face-to-face counseling with the patient and coordination of care, and documentation of the encounter.  Comer Dollar, MD  Division of Gynecologic Oncology  Department of Obstetrics and Gynecology  [facility] [place]  [facility]

## 2024-02-03 ENCOUNTER — Ambulatory Visit: Payer: Self-pay

## 2024-02-03 NOTE — Telephone Encounter (Signed)
 Per [name] NP, I reached out to Ms. Magri regarding recent lab results. She is aware of glucose level being elevated. She states she was not fasting.   Results faxed to PCP Valery Ripple MD.

## 2024-02-03 NOTE — Telephone Encounter (Signed)
-----   Message from Eleanor JONETTA Epps sent at 02/03/2024 10:20 AM EDT ----- Think this was obtained prior to CT scan. Please let her know her glucose was elevated on this lab and please fax results to PCP ----- Message ----- From: Interface, Lab In [place] Sent: 01/31/2024   3:59 PM EDT To: Eleanor JONETTA Epps, NP

## 2024-02-06 ENCOUNTER — Telehealth: Payer: Self-pay | Admitting: *Deleted

## 2024-02-06 NOTE — Telephone Encounter (Signed)
 Spoke with [name] and relayed message from [name], NP that patient stated to [facility] gynecology office that she didn't need a referral and was waiting to discuss with Dr. Viktoria?  Patient states they did call her and she declined because it was based on her labs that came back normal wether she would need to follow through with them.  Advised patient that her referral wasn't based on her labs, but based on her urinary symptoms that she discussed with Dr. Viktoria at office visit on 01/31/24.  Patient states she would like to wait until after her CT scan on 9/15 and discuss results with Dr. Viktoria before moving forward with the uro/gyn referral. Advised patient that her message would be relayed to providers. Pt thanked the office for calling.

## 2024-02-10 ENCOUNTER — Ambulatory Visit (INDEPENDENT_AMBULATORY_CARE_PROVIDER_SITE_OTHER)

## 2024-02-10 DIAGNOSIS — C541 Malignant neoplasm of endometrium: Secondary | ICD-10-CM | POA: Diagnosis not present

## 2024-02-10 DIAGNOSIS — R932 Abnormal findings on diagnostic imaging of liver and biliary tract: Secondary | ICD-10-CM | POA: Diagnosis not present

## 2024-02-10 DIAGNOSIS — I700 Atherosclerosis of aorta: Secondary | ICD-10-CM | POA: Diagnosis not present

## 2024-02-10 MED ORDER — IOHEXOL 9 MG/ML PO SOLN
500.0000 mL | ORAL | Status: AC
  Administered 2024-02-10 (×2): 500 mL via ORAL

## 2024-02-10 MED ORDER — IOHEXOL 300 MG/ML  SOLN
100.0000 mL | Freq: Once | INTRAMUSCULAR | Status: AC | PRN
Start: 2024-02-10 — End: 2024-02-10
  Administered 2024-02-10: 100 mL via INTRAVENOUS

## 2024-02-11 ENCOUNTER — Telehealth: Payer: Self-pay | Admitting: *Deleted

## 2024-02-11 NOTE — Progress Notes (Signed)
 Could you please call and pass along message about her CT results? Thank you

## 2024-02-11 NOTE — Telephone Encounter (Signed)
 Spoke with patient and relayed message from Dr. Viktoria -Things look very stable on your CT. No areas concerning for recurrent cancer.   Pt verbalized understanding and thanked the office for the good news.

## 2024-02-25 ENCOUNTER — Other Ambulatory Visit: Payer: Self-pay | Admitting: Gynecologic Oncology

## 2024-02-25 ENCOUNTER — Telehealth: Payer: Self-pay

## 2024-02-25 DIAGNOSIS — E1165 Type 2 diabetes mellitus with hyperglycemia: Secondary | ICD-10-CM | POA: Diagnosis not present

## 2024-02-25 DIAGNOSIS — R32 Unspecified urinary incontinence: Secondary | ICD-10-CM

## 2024-02-25 DIAGNOSIS — C3492 Malignant neoplasm of unspecified part of left bronchus or lung: Secondary | ICD-10-CM | POA: Diagnosis not present

## 2024-02-25 DIAGNOSIS — E785 Hyperlipidemia, unspecified: Secondary | ICD-10-CM | POA: Diagnosis not present

## 2024-02-25 DIAGNOSIS — I2510 Atherosclerotic heart disease of native coronary artery without angina pectoris: Secondary | ICD-10-CM | POA: Diagnosis not present

## 2024-02-25 NOTE — Telephone Encounter (Signed)
 [name] called stating she recently saw Dr.Tucker, they discussed her concern of urinary incontinence. Dr.Tucker placed a referral, however, at that time the patient didn't think she needed it. The referral was closed.   Please place another referral d/t Ms.Delarosa thinks she needs it.

## 2024-02-26 NOTE — Telephone Encounter (Signed)
 [name] is aware of new referral being placed to urogyn. She will call us  back to let us  know when that is scheduled.

## 2024-03-04 DIAGNOSIS — R32 Unspecified urinary incontinence: Secondary | ICD-10-CM | POA: Diagnosis not present

## 2024-03-04 DIAGNOSIS — E119 Type 2 diabetes mellitus without complications: Secondary | ICD-10-CM | POA: Diagnosis not present

## 2024-03-04 DIAGNOSIS — E1165 Type 2 diabetes mellitus with hyperglycemia: Secondary | ICD-10-CM | POA: Diagnosis not present

## 2024-03-16 ENCOUNTER — Telehealth: Payer: Self-pay | Admitting: *Deleted

## 2024-03-16 NOTE — Telephone Encounter (Signed)
 Spoke with Ms. Mcbrearty who called the office stating she has been having urine frequency without pain but feels pressure. Pt denies fever and chills. Pt is requesting an antibiotic?  Advised patient that we can schedule a lab appointment for her to give a urine sample. The providers won't be able to send in a Rx for antibiotic until we have results from a urine sample.  Pt states she is unable to do that today, because she is caring for a baby and tomorrow she goes out of town. Advised patient she will need to either bring a sample to her PCP's office or go to an urgent care that is most convenient. Pt verbalized understanding.

## 2024-03-18 ENCOUNTER — Telehealth: Payer: Self-pay

## 2024-03-18 DIAGNOSIS — R300 Dysuria: Secondary | ICD-10-CM | POA: Diagnosis not present

## 2024-03-18 DIAGNOSIS — E119 Type 2 diabetes mellitus without complications: Secondary | ICD-10-CM | POA: Diagnosis not present

## 2024-03-18 DIAGNOSIS — N390 Urinary tract infection, site not specified: Secondary | ICD-10-CM | POA: Diagnosis not present

## 2024-03-18 NOTE — Progress Notes (Signed)
 Subjective:   History of Present Illness The patient is a 83 year old female who presents for evaluation of a urinary tract infection.  She reports experiencing severe pressure, burning sensation, and increased frequency of urination. She has not observed any blood in her urine. She does not have any associated fevers, back pain, or vomiting. She has a history of recurrent urinary tract infections throughout her life. She took Tylenol  last night.  She is a diabetic. Her blood sugar this morning was 143.      Past Medical History:  Diagnosis Date  . Acute pancreatitis (*)   . Cataract   . Cervical cancer (*)   . Diabetes mellitus (*)    FSBS AVERAGE IN THE AM 120 PER PT.    . Dry eye   . Urinary frequency      Current Outpatient Medications  Medication Instructions  . Apoaequorin (PREVAGEN PO) 10 mg, Oral, Every morning  . aspirin  (ASPIRIN  CHILDRENS) 81 mg, Oral, Every morning  . Cephalexin (KEFLEX) 500 mg, Oral, 2 times a day  . Cholecalciferol (VITAMIN D3) 125 MCG (5000 UT) CAPS 1 capsule, Oral, Every morning  . Coenzyme Q10 (COQ-10) 100 MG CAPS 1 capsule, Oral, Every evening  . empagliflozin  (JARDIANCE ) 10 mg, Oral, Every morning  . metFORMIN ER (GLUCOPHAGE-XR) 1,000 mg, Oral, With supper  . metoprolol  tartrate (LOPRESSOR ) 25 mg, Oral, 2 times a day  . pancrelipase  (lipase-protease-amylase) (CREON  12000) 12,000 Units, Oral, 2 times a day  . pantoprazole  sodium (PROTONIX ) 40 mg tablet Oral  . simvastatin  (ZOCOR ) 20 mg, Oral, At bedtime  . sucralfate  (CARAFATE ) 1 g, Oral, 2 times a day     Allergies[1]      Objective:   Vitals:   03/18/24 1413  BP: 124/79  Pulse: 80  Resp: 20  Temp: 97.7 F (36.5 C)  SpO2: 95%      Physical Exam Constitutional: Awake, alert, oriented, nontoxic, appears comfortable ENT: Normal Neck: Supple Cardiovascular: Normal heart rhythm Respiratory: Clear lung sounds bilaterally Gastrointestinal: Abdomen soft, nontender, no CVA  tenderness Musculoskeletal: Moves all extremities normally Integument/Skin: Normal temperature and color     No results found.    Recent Results (from the past 24 hours)  POC Urine Dipstick   Collection Time: 03/18/24  2:16 PM  Result Value Ref Range   Color Yellow Yellow   Clarity Cloudy (A) Clear   Glucose,Urine 3+ (A) Negative mg/dL   Bilirubin Negative Negative   Ketones,Urine Negative Negative mg/dL   Specific Gravity 8.979 1.005, 1.010, 1.015, 1.020, 1.025, 1.030   Blood +/- (A) Negative   pH 5.5    Protein +/- (A) Negative mg/dL   Urobilinogen 0.2 0.2, 1.0 EU/dL   Nitrite Negative Negative   Leukocyte Esterase 2+ (A) Negative     Assessment:   1. Dysuria   2. Urinary tract infection without hematuria, site unspecified        Plan:   Patient's Medications  New Prescriptions   CEPHALEXIN (KEFLEX) 500 MG TABLET    Take one tablet (500 mg dose) by mouth 2 (two) times daily for 7 days.     Assessment & Plan Initial Assessment: Symptoms include extreme pressure, burning, and frequent urination. Urinalysis revealed leukocyte esterase and microscopic hematuria.  Differential Diagnosis: - Urinary tract infection: Symptoms suspicious. Urine culture pending. Prophylactic treatment with Keflex initiated. - Bladder infection: Symptoms overlap with UTI. Urine culture pending. Treatment with Keflex initiated.  Urgent Care Course: - Urinalysis revealed leukocyte esterase and microscopic hematuria. -  Urine culture sent. - Keflex 500 mg twice a day for 5 to 7 days initiated. - Precautions and warning signs discussed.  Final Assessment: Urinalysis indicated possible infection. Urine culture pending. Keflex initiated as prophylactic treatment. Precautions and warning signs discussed.  Clinical Impression: - Urinary tract infection - Diabetes mellitus  Disposition: - Follow-Up: Culture results in 2-3 days. Contact primary doctor if symptoms persist.  Patient  Education: Precautions and warning signs discussed. Advised to take Keflex with food and consume plenty of yogurt. Continue Tylenol  and Motrin  as needed. Maintain high fluid intake.       The diagnosis and corresponding management plan were discussed thoroughly with Heron, including a review of the available treatment options along with their potential risks and benefits. Lucindia demonstrated understanding of the information provided, verbalized agreement with the plan, and all questions were addressed. Melodee was advised to monitor symptoms closely and to seek prompt medical attention by contacting our clinic, their primary care provider, or presenting to the emergency department if symptoms worsen, fail to improve within 1-2 days, or if new concerning symptoms develop. Dorothia was encouraged to call the clinic with any additional concerns or questions.   AI technology was used to create visit note. Verbal consent from the patient/caregiver was obtained prior to its use.  Manus Maduro Huie, PA-C         [1] Allergies Allergen Reactions  . Ibuprofen  Hives

## 2024-03-18 NOTE — Telephone Encounter (Signed)
 [name] called the office to give an update regarding the call on 10/20. She still has the symptoms of a UTI, (pain,pressure,burning, frequency) reports no fever/chills, no abdominal pain/cramping. She states she lives over [place] hour away and asking if our office knows of a facility close to her home.   Advised she can go to her PCP or an urgent care to get tested/treated for a UTI. She states her PCP is in [place]. She will go to an urgent care facility close to her home.

## 2024-03-27 DIAGNOSIS — E1165 Type 2 diabetes mellitus with hyperglycemia: Secondary | ICD-10-CM | POA: Diagnosis not present

## 2024-03-27 DIAGNOSIS — I2510 Atherosclerotic heart disease of native coronary artery without angina pectoris: Secondary | ICD-10-CM | POA: Diagnosis not present

## 2024-03-27 DIAGNOSIS — E785 Hyperlipidemia, unspecified: Secondary | ICD-10-CM | POA: Diagnosis not present

## 2024-03-27 DIAGNOSIS — C3492 Malignant neoplasm of unspecified part of left bronchus or lung: Secondary | ICD-10-CM | POA: Diagnosis not present

## 2024-04-01 ENCOUNTER — Telehealth: Payer: Self-pay | Admitting: *Deleted

## 2024-04-01 NOTE — Telephone Encounter (Signed)
 Spoke with the patient and moved appt from 3/6 to 2/27

## 2024-04-26 DIAGNOSIS — E1165 Type 2 diabetes mellitus with hyperglycemia: Secondary | ICD-10-CM | POA: Diagnosis not present

## 2024-04-26 DIAGNOSIS — E785 Hyperlipidemia, unspecified: Secondary | ICD-10-CM | POA: Diagnosis not present

## 2024-04-26 DIAGNOSIS — C3492 Malignant neoplasm of unspecified part of left bronchus or lung: Secondary | ICD-10-CM | POA: Diagnosis not present

## 2024-04-26 DIAGNOSIS — I2510 Atherosclerotic heart disease of native coronary artery without angina pectoris: Secondary | ICD-10-CM | POA: Diagnosis not present

## 2024-06-24 ENCOUNTER — Ambulatory Visit: Admitting: Obstetrics

## 2024-07-02 ENCOUNTER — Telehealth: Payer: Self-pay | Admitting: *Deleted

## 2024-07-02 NOTE — Telephone Encounter (Signed)
 Spoke with patient who called to reschedule her lab appointment on Friday because of the weather?  Advised patient that she currently doesn't have lab orders and not sure why she has a scheduled lab appointment. Pt also does not recall why she has a scheduled lab appointment.  Pt has a follow up with Dr. Viktoria on 2/27. Pt is asking if she can have her labs drawn that day. Advised patient that her lab appointment will be canceled and if Dr. Viktoria wants labs drawn, they can be done on 2/27. Pt verbalized understanding and thanked the office.

## 2024-07-03 ENCOUNTER — Inpatient Hospital Stay

## 2024-07-24 ENCOUNTER — Inpatient Hospital Stay: Admitting: Gynecologic Oncology

## 2024-07-31 ENCOUNTER — Ambulatory Visit: Admitting: Gynecologic Oncology
